# Patient Record
Sex: Female | Born: 1970
Health system: Southern US, Community
[De-identification: ages and names within clinical notes are randomized; demographics above are authoritative.]

## PROBLEM LIST (undated history)

## (undated) DIAGNOSIS — E669 Obesity, unspecified: Secondary | ICD-10-CM

## (undated) DIAGNOSIS — G43909 Migraine, unspecified, not intractable, without status migrainosus: Secondary | ICD-10-CM

## (undated) DIAGNOSIS — M545 Low back pain, unspecified: Secondary | ICD-10-CM

## (undated) DIAGNOSIS — Z87442 Personal history of urinary calculi: Secondary | ICD-10-CM

## (undated) DIAGNOSIS — N12 Tubulo-interstitial nephritis, not specified as acute or chronic: Secondary | ICD-10-CM

## (undated) DIAGNOSIS — I4891 Unspecified atrial fibrillation: Secondary | ICD-10-CM

## (undated) DIAGNOSIS — E7800 Pure hypercholesterolemia, unspecified: Secondary | ICD-10-CM

## (undated) DIAGNOSIS — D649 Anemia, unspecified: Secondary | ICD-10-CM

## (undated) DIAGNOSIS — F419 Anxiety disorder, unspecified: Secondary | ICD-10-CM

## (undated) DIAGNOSIS — Z9289 Personal history of other medical treatment: Secondary | ICD-10-CM

## (undated) DIAGNOSIS — I10 Essential (primary) hypertension: Secondary | ICD-10-CM

## (undated) DIAGNOSIS — F410 Panic disorder [episodic paroxysmal anxiety] without agoraphobia: Secondary | ICD-10-CM

## (undated) DIAGNOSIS — K219 Gastro-esophageal reflux disease without esophagitis: Secondary | ICD-10-CM

## (undated) DIAGNOSIS — G4730 Sleep apnea, unspecified: Secondary | ICD-10-CM

## (undated) DIAGNOSIS — G2581 Restless legs syndrome: Secondary | ICD-10-CM

## (undated) DIAGNOSIS — F329 Major depressive disorder, single episode, unspecified: Secondary | ICD-10-CM

## (undated) DIAGNOSIS — F32A Depression, unspecified: Secondary | ICD-10-CM

## (undated) DIAGNOSIS — K3184 Gastroparesis: Secondary | ICD-10-CM

## (undated) DIAGNOSIS — I209 Angina pectoris, unspecified: Secondary | ICD-10-CM

## (undated) DIAGNOSIS — O24419 Gestational diabetes mellitus in pregnancy, unspecified control: Secondary | ICD-10-CM

## (undated) DIAGNOSIS — G8929 Other chronic pain: Secondary | ICD-10-CM

## (undated) HISTORY — DX: Sleep apnea, unspecified: G47.30

## (undated) HISTORY — PX: DILATION AND CURETTAGE, DIAGNOSTIC / THERAPEUTIC: SUR384

## (undated) HISTORY — PX: BACK SURGERY: SHX140

## (undated) HISTORY — PX: COLONOSCOPY: SHX174

## (undated) HISTORY — PX: DILATION AND CURETTAGE OF UTERUS: SHX78

---
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
## 2000-11-09 ENCOUNTER — Emergency Department (HOSPITAL_COMMUNITY): Admission: EM | Admit: 2000-11-09 | Discharge: 2000-11-09 | Payer: Self-pay

## 2001-04-02 ENCOUNTER — Emergency Department (HOSPITAL_COMMUNITY): Admission: EM | Admit: 2001-04-02 | Discharge: 2001-04-02 | Payer: Self-pay | Admitting: Emergency Medicine

## 2001-04-22 ENCOUNTER — Emergency Department (HOSPITAL_COMMUNITY): Admission: EM | Admit: 2001-04-22 | Discharge: 2001-04-22 | Payer: Self-pay | Admitting: Emergency Medicine

## 2001-08-14 ENCOUNTER — Emergency Department (HOSPITAL_COMMUNITY): Admission: EM | Admit: 2001-08-14 | Discharge: 2001-08-14 | Payer: Self-pay | Admitting: Emergency Medicine

## 2002-03-11 ENCOUNTER — Emergency Department (HOSPITAL_COMMUNITY): Admission: EM | Admit: 2002-03-11 | Discharge: 2002-03-11 | Payer: Self-pay | Admitting: Emergency Medicine

## 2002-03-19 ENCOUNTER — Encounter: Payer: Self-pay | Admitting: Emergency Medicine

## 2002-03-19 ENCOUNTER — Emergency Department (HOSPITAL_COMMUNITY): Admission: EM | Admit: 2002-03-19 | Discharge: 2002-03-19 | Payer: Self-pay | Admitting: Emergency Medicine

## 2002-04-13 ENCOUNTER — Emergency Department (HOSPITAL_COMMUNITY): Admission: EM | Admit: 2002-04-13 | Discharge: 2002-04-13 | Payer: Self-pay | Admitting: Emergency Medicine

## 2002-04-13 ENCOUNTER — Encounter: Payer: Self-pay | Admitting: Emergency Medicine

## 2002-05-13 ENCOUNTER — Emergency Department (HOSPITAL_COMMUNITY): Admission: EM | Admit: 2002-05-13 | Discharge: 2002-05-13 | Payer: Self-pay | Admitting: Emergency Medicine

## 2002-06-07 ENCOUNTER — Ambulatory Visit (HOSPITAL_BASED_OUTPATIENT_CLINIC_OR_DEPARTMENT_OTHER): Admission: RE | Admit: 2002-06-07 | Discharge: 2002-06-07 | Payer: Self-pay | Admitting: Otolaryngology

## 2002-06-23 ENCOUNTER — Emergency Department (HOSPITAL_COMMUNITY): Admission: EM | Admit: 2002-06-23 | Discharge: 2002-06-23 | Payer: Self-pay | Admitting: Emergency Medicine

## 2002-07-10 ENCOUNTER — Ambulatory Visit (HOSPITAL_COMMUNITY): Admission: RE | Admit: 2002-07-10 | Discharge: 2002-07-10 | Payer: Self-pay | Admitting: Gastroenterology

## 2002-07-17 ENCOUNTER — Ambulatory Visit: Admission: RE | Admit: 2002-07-17 | Discharge: 2002-07-17 | Payer: Self-pay | Admitting: Gastroenterology

## 2002-07-17 ENCOUNTER — Encounter: Payer: Self-pay | Admitting: Gastroenterology

## 2002-07-20 ENCOUNTER — Emergency Department (HOSPITAL_COMMUNITY): Admission: EM | Admit: 2002-07-20 | Discharge: 2002-07-20 | Payer: Self-pay | Admitting: Emergency Medicine

## 2002-08-01 ENCOUNTER — Emergency Department (HOSPITAL_COMMUNITY): Admission: EM | Admit: 2002-08-01 | Discharge: 2002-08-01 | Payer: Self-pay | Admitting: Emergency Medicine

## 2002-08-26 ENCOUNTER — Emergency Department (HOSPITAL_COMMUNITY): Admission: EM | Admit: 2002-08-26 | Discharge: 2002-08-26 | Payer: Self-pay | Admitting: Emergency Medicine

## 2002-09-22 ENCOUNTER — Emergency Department (HOSPITAL_COMMUNITY): Admission: EM | Admit: 2002-09-22 | Discharge: 2002-09-22 | Payer: Self-pay | Admitting: Emergency Medicine

## 2002-09-23 ENCOUNTER — Emergency Department (HOSPITAL_COMMUNITY): Admission: EM | Admit: 2002-09-23 | Discharge: 2002-09-23 | Payer: Self-pay | Admitting: Emergency Medicine

## 2003-05-11 ENCOUNTER — Emergency Department (HOSPITAL_COMMUNITY): Admission: EM | Admit: 2003-05-11 | Discharge: 2003-05-11 | Payer: Self-pay | Admitting: Emergency Medicine

## 2003-05-26 ENCOUNTER — Emergency Department (HOSPITAL_COMMUNITY): Admission: EM | Admit: 2003-05-26 | Discharge: 2003-05-26 | Payer: Self-pay | Admitting: Emergency Medicine

## 2003-06-24 ENCOUNTER — Other Ambulatory Visit: Admission: RE | Admit: 2003-06-24 | Discharge: 2003-06-24 | Payer: Self-pay | Admitting: Family Medicine

## 2003-08-10 ENCOUNTER — Emergency Department (HOSPITAL_COMMUNITY): Admission: EM | Admit: 2003-08-10 | Discharge: 2003-08-11 | Payer: Self-pay | Admitting: Emergency Medicine

## 2003-09-16 ENCOUNTER — Ambulatory Visit (HOSPITAL_COMMUNITY): Admission: RE | Admit: 2003-09-16 | Discharge: 2003-09-16 | Payer: Self-pay | Admitting: *Deleted

## 2003-09-17 ENCOUNTER — Emergency Department (HOSPITAL_COMMUNITY): Admission: EM | Admit: 2003-09-17 | Discharge: 2003-09-17 | Payer: Self-pay | Admitting: Emergency Medicine

## 2003-09-17 ENCOUNTER — Emergency Department (HOSPITAL_COMMUNITY): Admission: EM | Admit: 2003-09-17 | Discharge: 2003-09-17 | Payer: Self-pay

## 2003-11-09 ENCOUNTER — Inpatient Hospital Stay (HOSPITAL_COMMUNITY): Admission: EM | Admit: 2003-11-09 | Discharge: 2003-11-11 | Payer: Self-pay | Admitting: Emergency Medicine

## 2003-11-14 ENCOUNTER — Ambulatory Visit (HOSPITAL_COMMUNITY): Admission: RE | Admit: 2003-11-14 | Discharge: 2003-11-14 | Payer: Self-pay | Admitting: Obstetrics and Gynecology

## 2003-11-14 ENCOUNTER — Encounter (INDEPENDENT_AMBULATORY_CARE_PROVIDER_SITE_OTHER): Payer: Self-pay | Admitting: Specialist

## 2003-11-18 ENCOUNTER — Ambulatory Visit (HOSPITAL_COMMUNITY): Admission: RE | Admit: 2003-11-18 | Discharge: 2003-11-18 | Payer: Self-pay | Admitting: Urology

## 2003-12-05 ENCOUNTER — Emergency Department (HOSPITAL_COMMUNITY): Admission: EM | Admit: 2003-12-05 | Discharge: 2003-12-05 | Payer: Self-pay | Admitting: Emergency Medicine

## 2004-02-02 ENCOUNTER — Emergency Department (HOSPITAL_COMMUNITY): Admission: EM | Admit: 2004-02-02 | Discharge: 2004-02-02 | Payer: Self-pay | Admitting: Emergency Medicine

## 2004-02-03 ENCOUNTER — Emergency Department (HOSPITAL_COMMUNITY): Admission: EM | Admit: 2004-02-03 | Discharge: 2004-02-03 | Payer: Self-pay | Admitting: Emergency Medicine

## 2004-02-16 HISTORY — PX: CYSTOSCOPY W/ URETERAL STENT PLACEMENT: SHX1429

## 2004-03-13 ENCOUNTER — Emergency Department (HOSPITAL_COMMUNITY): Admission: EM | Admit: 2004-03-13 | Discharge: 2004-03-13 | Payer: Self-pay | Admitting: Emergency Medicine

## 2004-03-17 ENCOUNTER — Ambulatory Visit (HOSPITAL_COMMUNITY): Admission: RE | Admit: 2004-03-17 | Discharge: 2004-03-17 | Payer: Self-pay | Admitting: Otolaryngology

## 2004-03-27 ENCOUNTER — Emergency Department (HOSPITAL_COMMUNITY): Admission: EM | Admit: 2004-03-27 | Discharge: 2004-03-27 | Payer: Self-pay | Admitting: Emergency Medicine

## 2004-05-23 ENCOUNTER — Emergency Department (HOSPITAL_COMMUNITY): Admission: EM | Admit: 2004-05-23 | Discharge: 2004-05-23 | Payer: Self-pay | Admitting: Emergency Medicine

## 2004-07-30 ENCOUNTER — Other Ambulatory Visit: Admission: RE | Admit: 2004-07-30 | Discharge: 2004-07-30 | Payer: Self-pay | Admitting: Obstetrics and Gynecology

## 2005-04-14 ENCOUNTER — Emergency Department (HOSPITAL_COMMUNITY): Admission: EM | Admit: 2005-04-14 | Discharge: 2005-04-14 | Payer: Self-pay | Admitting: *Deleted

## 2005-05-02 ENCOUNTER — Emergency Department (HOSPITAL_COMMUNITY): Admission: EM | Admit: 2005-05-02 | Discharge: 2005-05-02 | Payer: Self-pay | Admitting: *Deleted

## 2005-05-30 ENCOUNTER — Emergency Department (HOSPITAL_COMMUNITY): Admission: EM | Admit: 2005-05-30 | Discharge: 2005-05-31 | Payer: Self-pay | Admitting: Emergency Medicine

## 2005-07-20 ENCOUNTER — Emergency Department (HOSPITAL_COMMUNITY): Admission: EM | Admit: 2005-07-20 | Discharge: 2005-07-20 | Payer: Self-pay | Admitting: *Deleted

## 2005-08-03 ENCOUNTER — Encounter: Admission: RE | Admit: 2005-08-03 | Discharge: 2005-11-01 | Payer: Self-pay | Admitting: Obstetrics and Gynecology

## 2005-09-27 ENCOUNTER — Ambulatory Visit (HOSPITAL_COMMUNITY): Admission: RE | Admit: 2005-09-27 | Discharge: 2005-09-27 | Payer: Self-pay | Admitting: Obstetrics and Gynecology

## 2005-10-07 ENCOUNTER — Inpatient Hospital Stay (HOSPITAL_COMMUNITY): Admission: AD | Admit: 2005-10-07 | Discharge: 2005-10-09 | Payer: Self-pay | Admitting: Obstetrics and Gynecology

## 2009-11-26 ENCOUNTER — Encounter: Payer: Self-pay | Admitting: Cardiovascular Disease

## 2009-11-29 ENCOUNTER — Inpatient Hospital Stay (HOSPITAL_COMMUNITY): Admission: AD | Admit: 2009-11-29 | Discharge: 2009-11-30 | Payer: Self-pay | Admitting: Obstetrics and Gynecology

## 2009-12-11 DIAGNOSIS — Z8742 Personal history of other diseases of the female genital tract: Secondary | ICD-10-CM | POA: Insufficient documentation

## 2009-12-11 DIAGNOSIS — G2581 Restless legs syndrome: Secondary | ICD-10-CM | POA: Insufficient documentation

## 2009-12-11 DIAGNOSIS — G43909 Migraine, unspecified, not intractable, without status migrainosus: Secondary | ICD-10-CM | POA: Insufficient documentation

## 2009-12-11 DIAGNOSIS — K3184 Gastroparesis: Secondary | ICD-10-CM | POA: Insufficient documentation

## 2009-12-11 DIAGNOSIS — K219 Gastro-esophageal reflux disease without esophagitis: Secondary | ICD-10-CM | POA: Insufficient documentation

## 2009-12-24 ENCOUNTER — Ambulatory Visit (HOSPITAL_COMMUNITY): Admission: RE | Admit: 2009-12-24 | Discharge: 2009-12-24 | Payer: Self-pay | Admitting: Obstetrics and Gynecology

## 2009-12-31 ENCOUNTER — Encounter
Admission: RE | Admit: 2009-12-31 | Discharge: 2009-12-31 | Payer: Self-pay | Source: Home / Self Care | Attending: Obstetrics and Gynecology | Admitting: Obstetrics and Gynecology

## 2010-01-06 ENCOUNTER — Ambulatory Visit: Payer: Self-pay | Admitting: Cardiovascular Disease

## 2010-01-06 DIAGNOSIS — R0602 Shortness of breath: Secondary | ICD-10-CM

## 2010-01-14 ENCOUNTER — Ambulatory Visit (HOSPITAL_COMMUNITY)
Admission: RE | Admit: 2010-01-14 | Discharge: 2010-01-14 | Payer: Self-pay | Source: Home / Self Care | Admitting: Obstetrics and Gynecology

## 2010-01-20 ENCOUNTER — Ambulatory Visit (HOSPITAL_COMMUNITY): Admission: RE | Admit: 2010-01-20 | Payer: Self-pay | Admitting: Cardiovascular Disease

## 2010-01-29 ENCOUNTER — Ambulatory Visit (HOSPITAL_COMMUNITY): Admission: RE | Admit: 2010-01-29 | Payer: Self-pay | Source: Home / Self Care | Admitting: Cardiovascular Disease

## 2010-01-29 ENCOUNTER — Ambulatory Visit: Payer: Self-pay

## 2010-02-11 ENCOUNTER — Ambulatory Visit (HOSPITAL_COMMUNITY): Admission: RE | Admit: 2010-02-11 | Payer: Self-pay | Source: Home / Self Care | Admitting: Obstetrics and Gynecology

## 2010-02-12 ENCOUNTER — Ambulatory Visit (HOSPITAL_COMMUNITY): Admission: RE | Admit: 2010-02-12 | Payer: Self-pay | Source: Home / Self Care | Admitting: Cardiovascular Disease

## 2010-02-12 ENCOUNTER — Ambulatory Visit: Payer: Self-pay

## 2010-02-15 HISTORY — PX: TUBAL LIGATION: SHX77

## 2010-02-17 ENCOUNTER — Ambulatory Visit (HOSPITAL_COMMUNITY): Admission: RE | Admit: 2010-02-17 | Payer: Self-pay | Source: Home / Self Care | Admitting: Cardiovascular Disease

## 2010-02-20 ENCOUNTER — Ambulatory Visit (HOSPITAL_COMMUNITY)
Admission: RE | Admit: 2010-02-20 | Discharge: 2010-02-20 | Payer: Self-pay | Source: Home / Self Care | Attending: Obstetrics and Gynecology | Admitting: Obstetrics and Gynecology

## 2010-02-23 ENCOUNTER — Ambulatory Visit (HOSPITAL_COMMUNITY)
Admission: RE | Admit: 2010-02-23 | Discharge: 2010-02-23 | Payer: Self-pay | Source: Home / Self Care | Attending: Cardiovascular Disease | Admitting: Cardiovascular Disease

## 2010-02-24 ENCOUNTER — Telehealth: Payer: Self-pay | Admitting: Cardiovascular Disease

## 2010-03-06 ENCOUNTER — Ambulatory Visit
Admission: RE | Admit: 2010-03-06 | Discharge: 2010-03-06 | Payer: Self-pay | Source: Home / Self Care | Attending: Cardiovascular Disease | Admitting: Cardiovascular Disease

## 2010-03-06 DIAGNOSIS — R000 Tachycardia, unspecified: Secondary | ICD-10-CM | POA: Insufficient documentation

## 2010-03-07 ENCOUNTER — Other Ambulatory Visit (HOSPITAL_COMMUNITY): Payer: Self-pay | Admitting: Obstetrics and Gynecology

## 2010-03-07 DIAGNOSIS — O2690 Pregnancy related conditions, unspecified, unspecified trimester: Secondary | ICD-10-CM

## 2010-03-08 ENCOUNTER — Encounter: Payer: Self-pay | Admitting: Obstetrics and Gynecology

## 2010-03-11 ENCOUNTER — Ambulatory Visit (HOSPITAL_COMMUNITY): Admission: RE | Admit: 2010-03-11 | Payer: Self-pay | Source: Home / Self Care | Admitting: Obstetrics & Gynecology

## 2010-03-17 NOTE — Letter (Signed)
Summary: [facility] - P[facility]  [facility]   Imported By: Marylou Mccoy 12/11/2009 16:08:34  _____________________________________________________________________  External Attachment:    Type:   Image     Comment:   External Document

## 2010-03-17 NOTE — Assessment & Plan Note (Signed)
Summary: np3/htn/MEDICAID/mt   CC:  referal from Dr. Waynard Reeds high blood pressure.  History of Present Illness: Sandra [name] is [redacted] weeks pregnant and referred by OB/Gyn for HTN.  She has 5 children and has had gestational hypertension and diabetes with all of them  It has been 6 years since her last pregnancy and it was a bit of a surprise.  BP well controlled with Labatolol.  No previous history of DCM or PPDCM  Has some weight gain and dyspnea.  Worsening refulx.  Tries to watch salt intake.  Nonsmoker nondrinker  Other children are healthy.    Current Problems (verified): 1)  Dyspnea  (ICD-786.05) 2)  Restless Leg Syndrome  (ICD-333.94) 3)  Hypertension  (ICD-401.9) 4)  Gastroparesis  (ICD-536.3) 5)  Gerd  (ICD-530.81) 6)  Diabetes Mellitus, Gestational, Hx of  (ICD-V13.29) 7)  Migraine Headache  (ICD-346.90)  Current Medications (verified): 1)  Fioricet 50-325-40 Mg Tabs (Butalbital-Apap-Caffeine) .... As Needed 2)  Labetalol Hcl 200 Mg Tabs (Labetalol Hcl) .Marland Kitchen.. 1 Tab By Mouth Two Times A Day 3)  Phenergan 25 Mg/ (Promethazine Hcl) .... As Needed 4)  Ambien .... As Needed 5)  Dexilant 60 Mg Cpdr (Dexlansoprazole) .Marland Kitchen.. 1  Tab By Mouth Two Times A Day 6)  Humulin  (Insulin Isophane & Regular) .... 22units At Bedtime and 14units Morning 7)  Insulin .Marland Kitchen.. 4 Untis Two Times A Day  Allergies (verified): No Known Drug Allergies  Past History:  Past Medical History: Last updated: 12/11/2009 HYPERTENSION GASTROPARESIS  GERD DIABETES MELLITUS, GESTATIONAL, HX OF  MIGRAINE HEADACHE   Past Surgical History: Last updated: 12/11/2009  Cystourethroscopy, left retrograde pyelogram, left ureteroscopy.  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   Vital Signs:  Patient profile:   40 year old female Height:      64 inches Weight:      222 pounds BMI:     38.24 Pulse rate:   98 / minute Resp:     14 per minute BP sitting:   115 / 75  (left arm)  Vitals Entered By: Kem Parkinson (January 06, 2010 12:00 PM)  Physical Exam  General:  ;Affect appropriate Healthy:  appears stated age HEENT: normal Neck supple with no adenopathy JVP normal no bruits no thyromegaly Lungs clear with no wheezing and good diaphragmatic motion Heart:  S1/S2 no murmur,rub, gallop or click PMI normal Abdomen: benighn, BS positve, no tenderness, no AAA Pregnant no bruit.  No HSM or HJR Distal pulses intact with no bruits No edema Neuro non-focal Skin warm and dry    Impression & Recommendations:  Problem # 1:  DYSPNEA (ICD-786.05) Echo to assess RV and LV function Her updated medication list for this problem includes:    Labetalol Hcl 200 Mg Tabs (Labetalol hcl) .Marland Kitchen... 1 tab by mouth two times a day  Orders: Echocardiogram (Echo)  Problem # 2:  HYPERTENSION (ICD-401.9) Well controlled No evidence of vascular disease coarctation or RAS on exam.  Continue BB Her updated medication list for this problem includes:    Labetalol Hcl 200 Mg Tabs (Labetalol hcl) .Marland Kitchen... 1 tab by mouth  two times a day  Patient Instructions: 1)  Your physician recommends that you schedule a follow-up appointment in: 2 MONTHS 2)  Your physician has requested that you have an echocardiogram.  Echocardiography is a painless test that uses sound waves to create images of your heart. It provides your doctor with information about the size and shape of your heart and how well your heart's chambers and valves are working.  This procedure takes approximately one hour. There are  no restrictions for this procedure.   EKG Report  Procedure date:  01/06/2010  Findings:      NSR 98 Normal ECG

## 2010-03-18 ENCOUNTER — Encounter: Payer: Self-pay | Admitting: Obstetrics & Gynecology

## 2010-03-19 ENCOUNTER — Ambulatory Visit (HOSPITAL_COMMUNITY)
Admission: RE | Admit: 2010-03-19 | Discharge: 2010-03-19 | Disposition: A | Discharge status: Home / Self Care | Payer: Medicaid Other | Source: Ambulatory Visit | Attending: Obstetrics & Gynecology | Admitting: Obstetrics & Gynecology

## 2010-03-19 DIAGNOSIS — O10019 Pre-existing essential hypertension complicating pregnancy, unspecified trimester: Secondary | ICD-10-CM | POA: Insufficient documentation

## 2010-03-19 DIAGNOSIS — E119 Type 2 diabetes mellitus without complications: Secondary | ICD-10-CM | POA: Insufficient documentation

## 2010-03-19 DIAGNOSIS — O09529 Supervision of elderly multigravida, unspecified trimester: Secondary | ICD-10-CM | POA: Insufficient documentation

## 2010-03-19 DIAGNOSIS — O358XX0 Maternal care for other (suspected) fetal abnormality and damage, not applicable or unspecified: Secondary | ICD-10-CM | POA: Insufficient documentation

## 2010-03-19 DIAGNOSIS — O24919 Unspecified diabetes mellitus in pregnancy, unspecified trimester: Secondary | ICD-10-CM | POA: Insufficient documentation

## 2010-03-19 NOTE — Assessment & Plan Note (Signed)
Summary: F2M/DM   CC:  sob.  History of Present Illness: Sandra [name] is [redacted]weeks pregnant and referred by OB/Gyn for HTN and tachy .  She has 5 children and has had gestational hypertension and diabetes with all of them  It has been 6 years since her last pregnancy and it was a bit of a surprise.  BP well controlled with Labatolol.  Has had some proteinuria and is being watched weekly by OB for Preeclampsia.   No previous history of DCM or PPDCM  Has some weight gain and dyspnea.  Worsening refulx.  Tries to watch salt intake.  Nonsmoker nondrinker  Other children are healthy.  Echo done  1/9 showed normal EF and no significant structural heart problem. \ Current Problems (verified): 1)  Dyspnea  (ICD-786.05) 2)  Restless Leg Syndrome  (ICD-333.94) 3)  Hypertension  (ICD-401.9) 4)  Gastroparesis  (ICD-536.3) 5)  Gerd  (ICD-530.81) 6)  Diabetes Mellitus, Gestational, Hx of  (ICD-V13.29) 7)  Migraine Headache  (ICD-346.90)  Current Medications (verified): 1)  Fioricet 50-325-40 Mg Tabs (Butalbital-Apap-Caffeine) .... As Needed 2)  Labetalol Hcl 300 Mg Tabs (Labetalol Hcl) .Marland Kitchen.. 1 Tab By Mouth Three Times A Day 3)  Phenergan 25 Mg/ (Promethazine Hcl) .... As Needed 4)  Ambien .... As Needed 5)  Dexilant 60 Mg Cpdr (Dexlansoprazole) .Marland Kitchen.. 1  Tab By Mouth Two Times A Day 6)  Humulin  (Insulin Isophane & Regular) .... As Directed 7)  Insulin .... As Directed  Allergies (verified): No Known Drug Allergies  Past History:  Past Medical History: Last updated: 12/11/2009 HYPERTENSION GASTROPARESIS  GERD DIABETES MELLITUS, GESTATIONAL, HX OF  MIGRAINE HEADACHE   Past Surgical History: Last updated: 12/11/2009  Cystourethroscopy, left retrograde pyelogram, left ureteroscopy.  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   Vital Signs:  Patient profile:   40 year old female Height:      64 inches Weight:      222 pounds BMI:     38.24 Pulse rate:   99 / minute Resp:     14 per minute BP sitting:   120 / 81  (left arm)  Vitals Entered By: Kem Parkinson (March 06, 2010 12:13 PM)  Physical Exam  General:  Affect appropriate Healthy:  appears stated age HEENT: normal Neck supple with no adenopathy JVP normal no bruits no thyromegaly Lungs clear with no wheezing and good diaphragmatic motion Heart:  S1/S2 no murmur,rub, gallop or click PMI normal Abdomen: benighn, BS positve, no tenderness, no AAA no bruit.  No HSM or HJR Distal pulses intact with no bruits No edema Neuro non-focal Skin warm and dry    Impression & Recommendations:  Problem # 1:  HYPERTENSION (ICD-401.9) Improved continue Labatolol Her updated medication list for this problem includes:    Labetalol Hcl 300 Mg Tabs (Labetalol hcl) .Marland Kitchen... 1 tab by mouth three times a day  Problem # 2:  DIABETES MELLITUS, GESTATIONAL, HX OF (ICD-V13.29) Improved continue BP control and Fiorocet  Problem # 3:  DIABETES MELLITUS, GESTATIONAL, HX OF (ICD-V13.29) Continue insulin coverage  Increased risk of miscarriage with DM and age  Problem # 4:  TACHYCARDIA (ICD-785.0) Physiologic no structural heart disease  by echo.  Continue labatolol

## 2010-03-19 NOTE — Progress Notes (Signed)
Summary: pt returning call  Phone Note Call from Patient Call back at Home Phone [phone]   Caller: Patient Summary of Call: pt returning call Initial call taken by: Judie Grieve,  February 24, 2010 2:29 PM  Follow-up for Phone Call        Phone Call Completed pt aware of echo results. Follow-up by: Scherrie Bateman, LPN,  February 24, 2010 2:53 PM

## 2010-03-20 ENCOUNTER — Other Ambulatory Visit (HOSPITAL_COMMUNITY): Payer: Self-pay | Admitting: Obstetrics and Gynecology

## 2010-03-26 ENCOUNTER — Ambulatory Visit (HOSPITAL_COMMUNITY)
Admission: RE | Admit: 2010-03-26 | Discharge: 2010-03-26 | Disposition: A | Discharge status: Home / Self Care | Payer: Medicaid Other | Source: Ambulatory Visit | Attending: Obstetrics and Gynecology | Admitting: Obstetrics and Gynecology

## 2010-03-26 ENCOUNTER — Other Ambulatory Visit (HOSPITAL_COMMUNITY): Payer: Self-pay | Admitting: Maternal and Fetal Medicine

## 2010-03-26 DIAGNOSIS — O99810 Abnormal glucose complicating pregnancy: Secondary | ICD-10-CM | POA: Insufficient documentation

## 2010-03-26 DIAGNOSIS — O352XX0 Maternal care for (suspected) hereditary disease in fetus, not applicable or unspecified: Secondary | ICD-10-CM | POA: Insufficient documentation

## 2010-03-26 DIAGNOSIS — O09529 Supervision of elderly multigravida, unspecified trimester: Secondary | ICD-10-CM

## 2010-03-26 DIAGNOSIS — O10019 Pre-existing essential hypertension complicating pregnancy, unspecified trimester: Secondary | ICD-10-CM | POA: Insufficient documentation

## 2010-03-26 DIAGNOSIS — O2690 Pregnancy related conditions, unspecified, unspecified trimester: Secondary | ICD-10-CM

## 2010-03-26 DIAGNOSIS — O337XX0 Maternal care for disproportion due to other fetal deformities, not applicable or unspecified: Secondary | ICD-10-CM | POA: Insufficient documentation

## 2010-04-03 ENCOUNTER — Ambulatory Visit (HOSPITAL_COMMUNITY): Payer: Self-pay

## 2010-04-20 ENCOUNTER — Inpatient Hospital Stay (HOSPITAL_COMMUNITY)
Admission: AD | Admit: 2010-04-20 | Discharge: 2010-04-22 | DRG: 781 | Disposition: A | Discharge status: Home / Self Care | Payer: PRIVATE HEALTH INSURANCE | Source: Ambulatory Visit | Attending: Obstetrics and Gynecology | Admitting: Obstetrics and Gynecology

## 2010-04-20 DIAGNOSIS — E119 Type 2 diabetes mellitus without complications: Secondary | ICD-10-CM | POA: Diagnosis present

## 2010-04-20 DIAGNOSIS — O24919 Unspecified diabetes mellitus in pregnancy, unspecified trimester: Secondary | ICD-10-CM | POA: Diagnosis present

## 2010-04-20 DIAGNOSIS — O352XX0 Maternal care for (suspected) hereditary disease in fetus, not applicable or unspecified: Secondary | ICD-10-CM | POA: Diagnosis present

## 2010-04-20 DIAGNOSIS — O337XX0 Maternal care for disproportion due to other fetal deformities, not applicable or unspecified: Secondary | ICD-10-CM | POA: Diagnosis present

## 2010-04-20 DIAGNOSIS — R809 Proteinuria, unspecified: Secondary | ICD-10-CM

## 2010-04-20 DIAGNOSIS — IMO0002 Reserved for concepts with insufficient information to code with codable children: Principal | ICD-10-CM | POA: Diagnosis present

## 2010-04-20 DIAGNOSIS — I10 Essential (primary) hypertension: Secondary | ICD-10-CM | POA: Diagnosis present

## 2010-04-20 DIAGNOSIS — O09529 Supervision of elderly multigravida, unspecified trimester: Secondary | ICD-10-CM | POA: Diagnosis present

## 2010-04-20 LAB — CBC
HCT: 32.5 % — ABNORMAL LOW (ref 36.0–46.0)
Hemoglobin: 10.1 g/dL — ABNORMAL LOW (ref 12.0–15.0)
MCH: 27.8 pg (ref 26.0–34.0)
MCHC: 31.1 g/dL (ref 30.0–36.0)
MCV: 89.5 fL (ref 78.0–100.0)
RBC: 3.63 MIL/uL — ABNORMAL LOW (ref 3.87–5.11)

## 2010-04-20 LAB — COMPREHENSIVE METABOLIC PANEL
ALT: 12 U/L (ref 0–35)
CO2: 21 mEq/L (ref 19–32)
Calcium: 8.6 mg/dL (ref 8.4–10.5)
Chloride: 104 mEq/L (ref 96–112)
Creatinine, Ser: 0.62 mg/dL (ref 0.4–1.2)
GFR calc non Af Amer: >60 mL/min (ref >60)
Glucose, Bld: 65 mg/dL — ABNORMAL LOW (ref 70–99)
Total Bilirubin: 0.6 mg/dL (ref 0.3–1.2)

## 2010-04-21 ENCOUNTER — Inpatient Hospital Stay (HOSPITAL_COMMUNITY): Payer: PRIVATE HEALTH INSURANCE

## 2010-04-21 LAB — GLUCOSE, CAPILLARY
Glucose-Capillary: 94 mg/dL (ref 70–99)
Glucose-Capillary: 126 mg/dL — ABNORMAL HIGH (ref 70–99)

## 2010-04-21 LAB — URINALYSIS, MICROSCOPIC ONLY
Bilirubin Urine: NEGATIVE
Ketones, ur: NEGATIVE mg/dL
Leukocytes, UA: NEGATIVE
Nitrite: NEGATIVE
Specific Gravity, Urine: >1.03 — ABNORMAL HIGH (ref 1.005–1.030)
Urobilinogen, UA: 0.2 mg/dL (ref 0.0–1.0)
pH: 6 (ref 5.0–8.0)

## 2010-04-21 LAB — HEMOGLOBIN A1C
Hgb A1c MFr Bld: 6.3 % — ABNORMAL HIGH (ref <5.7)
Mean Plasma Glucose: 134 mg/dL — ABNORMAL HIGH (ref <117)

## 2010-04-21 LAB — CREATININE CLEARANCE, URINE, 24 HOUR
Creatinine Clearance: 126 mL/min — ABNORMAL HIGH (ref 75–115)
Creatinine, 24H Ur: 1124 mg/d (ref 700–1800)
Creatinine, Urine: 80.3 mg/dL
Creatinine: 0.62 mg/dL (ref 0.4–1.2)

## 2010-04-22 ENCOUNTER — Inpatient Hospital Stay (HOSPITAL_COMMUNITY)
Admission: AD | Admit: 2010-04-22 | Payer: Medicaid Other | Source: Ambulatory Visit | Admitting: Obstetrics and Gynecology

## 2010-04-22 ENCOUNTER — Inpatient Hospital Stay (HOSPITAL_COMMUNITY)
Admission: AD | Admit: 2010-04-22 | Discharge: 2010-04-22 | Disposition: A | Discharge status: Home / Self Care | Payer: PRIVATE HEALTH INSURANCE | Source: Ambulatory Visit | Attending: Obstetrics and Gynecology | Admitting: Obstetrics and Gynecology

## 2010-04-22 DIAGNOSIS — O4700 False labor before 37 completed weeks of gestation, unspecified trimester: Secondary | ICD-10-CM | POA: Insufficient documentation

## 2010-04-22 LAB — GLUCOSE, CAPILLARY
Glucose-Capillary: 108 mg/dL — ABNORMAL HIGH (ref 70–99)
Glucose-Capillary: 99 mg/dL (ref 70–99)

## 2010-04-22 LAB — CBC
Hemoglobin: 10.4 g/dL — ABNORMAL LOW (ref 12.0–15.0)
Platelets: 232 10*3/uL (ref 150–400)
RBC: 3.76 MIL/uL — ABNORMAL LOW (ref 3.87–5.11)
WBC: 11.4 10*3/uL — ABNORMAL HIGH (ref 4.0–10.5)

## 2010-04-22 LAB — COMPREHENSIVE METABOLIC PANEL
Alkaline Phosphatase: 80 U/L (ref 39–117)
BUN: 11 mg/dL (ref 6–23)
Chloride: 105 mEq/L (ref 96–112)
Creatinine, Ser: 0.68 mg/dL (ref 0.4–1.2)
GFR calc non Af Amer: >60 mL/min (ref >60)
Glucose, Bld: 93 mg/dL (ref 70–99)
Potassium: 4.7 mEq/L (ref 3.5–5.1)
Total Bilirubin: 0.5 mg/dL (ref 0.3–1.2)

## 2010-04-22 LAB — URINE CULTURE
Culture  Setup Time: 201203060512
Special Requests: NEGATIVE

## 2010-04-22 LAB — STREP B DNA PROBE

## 2010-04-22 LAB — TSH: TSH: 0.519 u[IU]/mL (ref 0.350–4.500)

## 2010-04-23 LAB — PROTEIN, URINE, 24 HOUR
Collection Interval-UPROT: 24 hours
Protein, Urine: 55 mg/dL

## 2010-04-24 ENCOUNTER — Ambulatory Visit (HOSPITAL_COMMUNITY): Payer: Medicaid Other

## 2010-04-29 LAB — DIFFERENTIAL
Basophils Relative: 0 % (ref 0–1)
Eosinophils Relative: 0 % (ref 0–5)
Lymphocytes Relative: 8 % — ABNORMAL LOW (ref 12–46)
Lymphs Abs: 0.5 10*3/uL — ABNORMAL LOW (ref 0.7–4.0)
Lymphs Abs: 0.9 10*3/uL (ref 0.7–4.0)
Monocytes Absolute: 0.3 10*3/uL (ref 0.1–1.0)
Monocytes Relative: 5 % (ref 3–12)
Monocytes Relative: 8 % (ref 3–12)
Neutro Abs: 3.4 10*3/uL (ref 1.7–7.7)
Neutrophils Relative %: 74 % (ref 43–77)

## 2010-04-29 LAB — URINE CULTURE
Colony Count: >=100000
Culture  Setup Time: 201110161142

## 2010-04-29 LAB — BLOOD GAS, ARTERIAL
FIO2: 0.21 %
O2 Saturation: 97 %
pCO2 arterial: 28.9 mmHg — ABNORMAL LOW (ref 35.0–45.0)
pO2, Arterial: 82.6 mmHg (ref 80.0–100.0)

## 2010-04-29 LAB — COMPREHENSIVE METABOLIC PANEL
AST: 25 U/L (ref 0–37)
Albumin: 3.5 g/dL (ref 3.5–5.2)
BUN: 5 mg/dL — ABNORMAL LOW (ref 6–23)
Calcium: 8.3 mg/dL — ABNORMAL LOW (ref 8.4–10.5)
Calcium: 9.1 mg/dL (ref 8.4–10.5)
Creatinine, Ser: 0.56 mg/dL (ref 0.4–1.2)
Creatinine, Ser: 0.61 mg/dL (ref 0.4–1.2)
GFR calc Af Amer: >60 mL/min (ref >60)
Glucose, Bld: 111 mg/dL — ABNORMAL HIGH (ref 70–99)
Total Protein: 6.3 g/dL (ref 6.0–8.3)

## 2010-04-29 LAB — INFLUENZA PANEL BY PCR (TYPE A & B): Influenza B By PCR: NEGATIVE

## 2010-04-29 LAB — GLUCOSE, CAPILLARY
Glucose-Capillary: 121 mg/dL — ABNORMAL HIGH (ref 70–99)
Glucose-Capillary: 189 mg/dL — ABNORMAL HIGH (ref 70–99)
Glucose-Capillary: 82 mg/dL (ref 70–99)

## 2010-04-29 LAB — URINE MICROSCOPIC-ADD ON

## 2010-04-29 LAB — URINALYSIS, ROUTINE W REFLEX MICROSCOPIC
Glucose, UA: NEGATIVE mg/dL
Leukocytes, UA: NEGATIVE
Protein, ur: 100 mg/dL — AB
Specific Gravity, Urine: >1.03 — ABNORMAL HIGH (ref 1.005–1.030)

## 2010-04-29 LAB — CBC
HCT: 32.4 % — ABNORMAL LOW (ref 36.0–46.0)
MCH: 30.4 pg (ref 26.0–34.0)
MCHC: 33.8 g/dL (ref 30.0–36.0)
MCHC: 34 g/dL (ref 30.0–36.0)
MCV: 90.1 fL (ref 78.0–100.0)
Platelets: 198 10*3/uL (ref 150–400)
RBC: 4.31 MIL/uL (ref 3.87–5.11)
RDW: 14.9 % (ref 11.5–15.5)

## 2010-05-05 ENCOUNTER — Other Ambulatory Visit: Payer: Self-pay | Admitting: Obstetrics and Gynecology

## 2010-05-05 ENCOUNTER — Inpatient Hospital Stay (HOSPITAL_COMMUNITY)
Admission: RE | Admit: 2010-05-05 | Discharge: 2010-05-08 | DRG: 765 | Disposition: A | Discharge status: Home / Self Care | Payer: PRIVATE HEALTH INSURANCE | Source: Ambulatory Visit | Attending: Obstetrics and Gynecology | Admitting: Obstetrics and Gynecology

## 2010-05-05 DIAGNOSIS — O99814 Abnormal glucose complicating childbirth: Secondary | ICD-10-CM | POA: Diagnosis present

## 2010-05-05 DIAGNOSIS — O09529 Supervision of elderly multigravida, unspecified trimester: Secondary | ICD-10-CM | POA: Diagnosis present

## 2010-05-05 DIAGNOSIS — O321XX0 Maternal care for breech presentation, not applicable or unspecified: Principal | ICD-10-CM | POA: Diagnosis present

## 2010-05-05 DIAGNOSIS — O1002 Pre-existing essential hypertension complicating childbirth: Secondary | ICD-10-CM | POA: Diagnosis present

## 2010-05-05 DIAGNOSIS — Z302 Encounter for sterilization: Secondary | ICD-10-CM

## 2010-05-05 LAB — COMPREHENSIVE METABOLIC PANEL
ALT: 13 U/L (ref 0–35)
AST: 18 U/L (ref 0–37)
Alkaline Phosphatase: 98 U/L (ref 39–117)
Glucose, Bld: 72 mg/dL (ref 70–99)
Potassium: 4.2 mEq/L (ref 3.5–5.1)
Sodium: 134 mEq/L — ABNORMAL LOW (ref 135–145)
Total Protein: 6.7 g/dL (ref 6.0–8.3)

## 2010-05-05 LAB — CBC
HCT: 34 % — ABNORMAL LOW (ref 36.0–46.0)
MCV: 88.8 fL (ref 78.0–100.0)
Platelets: 199 10*3/uL (ref 150–400)
RBC: 3.83 MIL/uL — ABNORMAL LOW (ref 3.87–5.11)
WBC: 11.5 10*3/uL — ABNORMAL HIGH (ref 4.0–10.5)

## 2010-05-05 LAB — GLUCOSE, CAPILLARY
Glucose-Capillary: 89 mg/dL (ref 70–99)
Glucose-Capillary: 81 mg/dL (ref 70–99)

## 2010-05-05 LAB — RPR: RPR Ser Ql: NONREACTIVE

## 2010-05-06 LAB — GLUCOSE, CAPILLARY
Glucose-Capillary: 89 mg/dL (ref 70–99)
Glucose-Capillary: 113 mg/dL — ABNORMAL HIGH (ref 70–99)

## 2010-05-06 LAB — CBC
Hemoglobin: 8.8 g/dL — ABNORMAL LOW (ref 12.0–15.0)
RBC: 3.25 MIL/uL — ABNORMAL LOW (ref 3.87–5.11)
WBC: 10.1 10*3/uL (ref 4.0–10.5)

## 2010-05-07 LAB — GLUCOSE, CAPILLARY
Glucose-Capillary: 106 mg/dL — ABNORMAL HIGH (ref 70–99)
Glucose-Capillary: 103 mg/dL — ABNORMAL HIGH (ref 70–99)
Glucose-Capillary: 103 mg/dL — ABNORMAL HIGH (ref 70–99)

## 2010-05-08 LAB — GLUCOSE, CAPILLARY: Glucose-Capillary: 93 mg/dL (ref 70–99)

## 2010-05-08 NOTE — Op Note (Signed)
Sandra [name], Sandra [name] NO.:  [id number]  MEDICAL RECORD NO.:  [id number]           PATIENT TYPE:  I  LOCATION:  9127                          FACILITY:  [facility]  PHYSICIAN:  Malva Limes, M.D.    DATE OF BIRTH:  [birth year]  DATE OF PROCEDURE:  05/05/2010 DATE OF DISCHARGE:                              OPERATIVE REPORT   PREOPERATIVE DIAGNOSES: 1. Intrauterine pregnancy at 35 weeks estimated gestational age. 2. Persistent breech presentation. 3. The patient desires permanent sterilization. 4. Chronic hypertension with worsening superimposed preeclampsia. 5. Gestational diabetes requiring insulin, poorly controlled.  POSTOPERATIVE DIAGNOSES: 1. Intrauterine pregnancy at 35 weeks estimated gestational age. 2. Persistent breech presentation. 3. The patient desires permanent sterilization. 4. Chronic hypertension with worsening superimposed preeclampsia. 5. Gestational diabetes requiring insulin, poorly controlled.  PROCEDURES: 1. Primary low transverse cesarean section. 2. Bilateral tubal ligation, Pomeroy procedure.  SURGEON:  Malva Limes, MD.  Threasa HeadsAldona Bar.  ANESTHESIA:  Spinal antibiotic, cefotetan 2 g.  DRAINS:  Foley bedside drainage.  ESTIMATED BLOOD LOSS:  900 mL.  SPECIMENS:  Portion of right and left fallopian tube sent to Pathology. Placenta sent to Pathology.  COMPLICATIONS:  None.  ESTIMATED BLOOD LOSS:  900 mL.  PROCEDURE IN DETAILS:  The patient was taken to the operating room where a spinal anesthetic was administered by Dr. Leilani Able.  Once this was accomplished, the patient was placed in a dorsal supine position with a left lateral tilt.  Once an adequate level was reached, the patient was prepped and draped in the usual fashion for this procedure.  A Pfannenstiel incision was made 2 cm above the pubic symphysis.  This was carried down to the abdominal cavity.  On entering the abdominal cavity, the bladder flap was  taken down with sharp dissection.  A low-transverse uterine incision was made in the midline and extended laterally with blunt dissection.  Amniotic fluid was noted to be clear.  The infant was delivered in breech presentation.  On delivery of the head, the oropharynx and nostrils bulb suctioned.  The cord was doubly clamped and cut and handed to the awaiting NICU team. Cord blood was then obtained.  The placenta was manually removed.  The uterus was exteriorized.  Uterine cavity was wiped with a wet lap.  The uterine incision was closed using a single layer of running locking 0 Monocryl suture.  The second imbricating layer using 0 Monocryl suture was then used.  The right uterine vessel was bleeding and this was made hemostatic using two figure-of-eight sutures.  The bladder flap was not repaired.  At this point the right fallopian tube was grasped in the isthmic portion.  A 2- to 3-cm knuckle was doubly ligated with 0 plain gut suture.  The knuckle was excised, both ostia visualized.  Hemostasis was appeared to be adequate.  A similar procedure was performed on the opposite side.  At this point the uterus was placed back in the abdominal cavity.  Hemostasis was checked and felt to be adequate.  The parietal peritoneum and rectus muscles were approximated in the midline using 2-0 Monocryl in a  running fashion.  The fascia was closed using 0 Monocryl suture in a running fashion.  The subcuticular tissue was approximately 8 cm deep.  It was made hemostatic with a Bovie.  That space was then closed using interrupted 2-0 plain gut suture.  The skin incision was closed using 3-0 Vicryl in a subcuticular fashion.  Steri- Strips were then placed.  The patient tolerated the procedure well.  She was taken to the recovery room in stable condition.  Instrument and lap counts were correct x3.          ______________________________ Malva Limes, M.D.     MA/MEDQ  D:  05/05/2010  T:   05/06/2010  Job:  [id number]  Electronically Signed by Malva Limes M.D. on 05/08/2010 05:31:10 PM

## 2010-05-09 NOTE — Discharge Summary (Signed)
  Sandra [name], MCMEEKIN NO.:  [id number]  MEDICAL RECORD NO.:  [id number]           PATIENT TYPE:  I  LOCATION:  9127                          [facility]ILITY[facility]  PHYSICIAN:  Malva Limes, M.D.    DATE OF BIRTH:  1970/04/14  DATE OF ADMISSION:  05/05/2010 DATE OF DISCHARGE:  05/07/2010                              DISCHARGE SUMMARY   DISCHARGE DIAGNOSES: 1. Intrauterine pregnancy at 35 weeks estimated gestational age. 2. Advanced maternal age. 3. Chronic hypertension with worsening superimposed preeclampsia. 4. Poorly controlled gestational diabetes, insulin requiring. 5. Breech presentation. 6. Patient desires permanent sterilization.  PRINCIPAL PROCEDURES: 1. Primary low transverse cesarean section. 2. Bilateral tubal ligation.  HISTORY OF PRESENT ILLNESS:  [name] is a 40 year old white female G8, P 5-0-2-5, [facility] June 09, 2010 at 35 weeks estimated gestational age who was admitted on March 20, 2010 for a primary cesarean section and bilateral tubal ligation at the suggestion of the Maternal Fetal Medicine Service.  The patient's pregnancy was complicated by chronic hypertension with worsening superimposed preeclampsia.  She also had insulin-requiring gestational diabetes which was poorly controlled. Because of the persistent breech presentation, the patient required cesarean section.  She also had a desire to have permanent sterilization.  She understood the failure rates with possible complications and possible change in her menstrual cycles.  HOSPITAL COURSE:  The patient was admitted on May 05, 2010 and underwent a cesarean section with bilateral tubal ligation and complete description of this can be found in the dictated operative notes.  The patient's postop hemoglobin was 8.8.  The pathology from a tubal ligation is pending.  The preop hemoglobin was 10.5.  The patient's blood sugars remained stable on no medication in the postpartum  period. Her blood pressures were labile requiring occasionally use of hypertensive meds.  The patient will be discharged to home on postop day #3.  She will be instructed to follow up in the office in 1 week.  She will be sent home with Percocet to take p.r.n. and also labetalol.  She will call the office with any elevated blood pressures.          ______________________________ Malva Limes, M.D.     MA/MEDQ  D:  05/08/2010  T:  05/09/2010  Job:  [id number]  Electronically Signed by Malva Limes M.D. on 05/09/2010 12:01:09 PM

## 2010-05-16 ENCOUNTER — Emergency Department (HOSPITAL_COMMUNITY)
Admission: EM | Admit: 2010-05-16 | Discharge: 2010-05-16 | Disposition: A | Discharge status: Home / Self Care | Payer: PRIVATE HEALTH INSURANCE | Attending: Emergency Medicine | Admitting: Emergency Medicine

## 2010-05-16 DIAGNOSIS — O99893 Other specified diseases and conditions complicating puerperium: Secondary | ICD-10-CM | POA: Insufficient documentation

## 2010-05-16 DIAGNOSIS — R112 Nausea with vomiting, unspecified: Secondary | ICD-10-CM | POA: Insufficient documentation

## 2010-05-16 DIAGNOSIS — R51 Headache: Secondary | ICD-10-CM | POA: Insufficient documentation

## 2010-05-16 DIAGNOSIS — I10 Essential (primary) hypertension: Secondary | ICD-10-CM | POA: Insufficient documentation

## 2010-07-03 NOTE — Op Note (Signed)
NAME:  Sandra [name], Sandra [name] NO.:  [id number]   MEDICAL RECORD NO.:  [id number]          PATIENT TYPE:  AMB   LOCATION:  SDC                           [facility]   PHYSICIAN:  Miguel Aschoff, M.D.       DATE OF BIRTH:  10-23-1970   DATE OF PROCEDURE:  11/14/2003  DATE OF DISCHARGE:                                 OPERATIVE REPORT   PREOPERATIVE DIAGNOSES:  Missed abortion.   POSTOPERATIVE DIAGNOSES:  Missed abortion.   PROCEDURE:  Suction curettage.   SURGEON:  Miguel Aschoff, M.D.   ANESTHESIA:  IV sedation with paracervical block.   COMPLICATIONS:  None.   JUSTIFICATION:  The patient is a 40 year old white female, gravida 5, para 4-  0-0-4 approximately 6-[redacted] weeks gestation. The patient presented to [facility] having a little abdominal pain and bleeding. Ultrasound  examination was carried out on the 24th of September revealing a 5 6/[redacted] week  gestational sac, no fetal pole was identified. The patient continued to have  bleeding throughout the week and presented to the office on September 28 and  repeat ultrasound did not show any evidence of the remaining gestational sac  and there was significant vaginal bleeding.  Because of the vaginal bleeding  she is being taken down to undergo suction curettage to evacuate the uterus  of any remaining products of conception.  The risks and benefits were  discussed with the patient. Informed consent was obtained.   DESCRIPTION OF PROCEDURE:  The patient was taken to the operating room,  placed in the supine position and IV sedation was administered without  difficulty.  She was then placed in the dorsal lithotomy position, prepped  and draped in the usual sterile fashion.  Once this was done, examination  was carried out revealing the uterus to be anterior approximately six week  size with a significant amount of blood in the vaginal vault. The cervical  os was noted to be open.  It is possible to pass a #25 Pratt  dilator without  difficulty through the cervical os and once this was done and the vacuum  curette was connection to the suction unit and suction curettage was carried  out yielding a small amount of tissue.  Following this, sharp curettage was  carried out and a final pass was made with a suction curette. At this point  with no further tissue being within the confines of the uterus, the  procedure was completed. All instruments were removed, instrument counts and  lap counts were taken and found to be correct.  The patient was brought to  the recovery room in satisfactory condition. Estimated blood loss from the  procedure was less than 30 mL. The patient tolerated the procedure well. The  plan is for the patient to be discharged home. Medications for  home include doxycycline 100 mg twice a day x3 days and Darvocet-N 100, 1  every 6 hours as needed for pain.  She is instructed to place nothing in the  vagina for two weeks and to call if there are any problems such  as fever,  pain or heavy bleeding.      AR/MEDQ  D:  11/14/2003  T:  11/14/2003  Job:  [id number]

## 2010-07-03 NOTE — Op Note (Signed)
NAME:  Sandra [name], Sandra [name]              ACCOUNT NO.:  [id number]   MEDICAL RECORD NO.:  [id number]          PATIENT TYPE:  AMB   LOCATION:  DAY                          [facility]ACILITY[facility]   PHYSICIAN:  Sigmund I. Patsi Sears, M.D.DATE OF BIRTH:  May 08, 1970   DATE OF PROCEDURE:  11/18/2003  DATE OF DISCHARGE:                                 OPERATIVE REPORT   PREOPERATIVE DIAGNOSIS:  Left ureteral stone.   POSTOPERATIVE DIAGNOSIS:  Left ureteral stone.   OPERATION:  Cystourethroscopy, left retrograde pyelogram, left ureteroscopy.   SURGEON:  Sigmund I. Patsi Sears, M.D.   ANESTHESIA:  General LMA.   PREPARATION:  After appropriate preanesthesia, the patient is brought to the  operating room and placed on the operating room table in a dorsal supine  position, where general LMA anesthesia was introduced.  She was then  replaced in the dorsal lithotomy position.  The pubis is prepped with  Betadine solution and draped in the usual fashion.   BRIEF HISTORY:  This 40 year old female is 1-1/2 weeks status post  cystourethroscopy, left retrograde pyelogram, and double-J catheter for  presentation to the emergency room with small distal ureteral calculus with  severe ureteral colic.  She was seven weeks pregnant at the time, requiring  25 mg of morphine IV and 4 mg of Dilaudid IV for pain control.  Retrograde  pyelogram was done with lead shields over the pelvis.  The double-J catheter  was left in place with a nylon suture attached to it, which the patient  removed herself because the double-J catheter was irritating to her.  She  has since had a spontaneous miscarriage and desires to have basket  extraction.  The patient passed a small clot-like substance this morning but  still has some discomfort in her ureter.   PROCEDURE:  Left retrograde pyelogram is performed, which shows a normal  caliber ureter and no evidence of a filling defect.  Ureteroscopy is  accomplished to the level of the  renal pelvis, and no stone is identified.  There is no hydronephrosis.  The renal pelvis is evaluated, and at least  eight different areas were identified.  The infundibulum is evaluated, and  there is no stone in the calyx or infundibulum.  The ureter is identified  again on the way to remove the scope.  Again, no stone is identified.  Xylocaine gel is left in the ureter.  The patient was given IV Toradol, a  B&O suppository, and is awakened and taken to the recovery room in good  condition.      SIT/MEDQ  D:  11/18/2003  T:  11/18/2003  Job:  [id number]

## 2010-07-20 NOTE — Discharge Summary (Signed)
  NAME:  Sandra [name], Sandra [name]               ACCOUNT NO.:  [id number]  MEDICAL RECORD NO.:  [id number]           PATIENT TYPE:  I  LOCATION:  9158                          [facility]ILITY[facility]  PHYSICIAN:  Eliel Dudding H. Tenny Craw, MD     DATE OF BIRTH:  03/06/1970  DATE OF ADMISSION:  04/20/2010 DATE OF DISCHARGE:  04/22/2010                              DISCHARGE SUMMARY   DISCHARGE DIAGNOSES: 1. Intrauterine pregnancy. 2. Chronic hypertension.  HOSPITAL PROCEDURES: 1. Glucose management 2. Blood pressure surveillance.  HOSPITAL COURSE:  [name] is a 40 year old gravida 8,  para 5-0-2-5 who was admitted at [redacted] weeks  gestational age for evaluation of worsening hypertension.  The patient's pregnancy has been complicated by multiple factors including hypertension, class B gestational diabetes and chronic  urinary tract infection. Patient was admitted to antenatal for worsening blood pressures and evaluation for possible superimposed pre-eclampsia. During this hospitalization a MFM consultation was obtained. In consultation with Dr. Tona Sensing of  MFM the criteria that he felt constituted severe pre-eclampsia in this patient with chronic hypertension were progressive proteinuria, accelerated requirement for antihypertensives, and worsening symptoms. During the hospitalization pre-eclampsia labs  were within normal limits. An Ultrasound was obtained and the fetus was noted to be well grown weighing 2256 g, 70th  percentile with amniotic fluid index of 14.  The decision was made to discharge the patient home on three times weekly  fetal surveillance and with labetalol 900 q.a.m., 600 every lunch, and 900 at dinner .  She will continue to monitor her blood glucoses closely.  Her current regimen is NPH 22 units  q.a.m. and 47 units nightly, regular insulin 12 units every breakfast and 8 units q lunch and 12 units at dinner.  She is instructed in signs or symptoms to be aware of and need to return to the hospital.   We will follow up in the office in 2 days.  DISCHARGE LABORATORY FINDINGS:  White blood cell count 11.4, hemoglobin 10.4, platelets 232,  sodium 134, potassium 4.7, chloride 105, CO2 23, BUN 11, creatinine 0.68, glucose 93 , 24 hour urine 730 mg of protein and urine cultures negative.     Freddrick March. Tenny Craw, MD     KHR/MEDQ  D:  07/17/2010  T:  07/18/2010  Job:  [id number]  Electronically Signed by Waynard Reeds MD on 07/20/2010 10:15:49 PM

## 2010-08-09 ENCOUNTER — Emergency Department (HOSPITAL_COMMUNITY)
Admission: EM | Admit: 2010-08-09 | Discharge: 2010-08-10 | Disposition: A | Discharge status: Home / Self Care | Payer: Medicaid Other | Attending: Emergency Medicine | Admitting: Emergency Medicine

## 2010-08-09 DIAGNOSIS — O169 Unspecified maternal hypertension, unspecified trimester: Secondary | ICD-10-CM | POA: Insufficient documentation

## 2010-08-09 DIAGNOSIS — K219 Gastro-esophageal reflux disease without esophagitis: Secondary | ICD-10-CM | POA: Insufficient documentation

## 2010-08-09 DIAGNOSIS — O99891 Other specified diseases and conditions complicating pregnancy: Secondary | ICD-10-CM | POA: Insufficient documentation

## 2010-08-09 DIAGNOSIS — G43909 Migraine, unspecified, not intractable, without status migrainosus: Secondary | ICD-10-CM | POA: Insufficient documentation

## 2010-09-26 ENCOUNTER — Emergency Department (HOSPITAL_COMMUNITY)
Admission: EM | Admit: 2010-09-26 | Discharge: 2010-09-26 | Disposition: A | Discharge status: Home / Self Care | Payer: PRIVATE HEALTH INSURANCE | Attending: Emergency Medicine | Admitting: Emergency Medicine

## 2010-09-26 DIAGNOSIS — R109 Unspecified abdominal pain: Secondary | ICD-10-CM | POA: Insufficient documentation

## 2010-09-26 DIAGNOSIS — I10 Essential (primary) hypertension: Secondary | ICD-10-CM | POA: Insufficient documentation

## 2010-09-26 DIAGNOSIS — Z8614 Personal history of Methicillin resistant Staphylococcus aureus infection: Secondary | ICD-10-CM | POA: Insufficient documentation

## 2010-09-26 DIAGNOSIS — D649 Anemia, unspecified: Secondary | ICD-10-CM | POA: Insufficient documentation

## 2010-09-26 DIAGNOSIS — N920 Excessive and frequent menstruation with regular cycle: Secondary | ICD-10-CM | POA: Insufficient documentation

## 2010-09-26 LAB — CBC
HCT: 31.6 % — ABNORMAL LOW (ref 36.0–46.0)
Hemoglobin: 9.8 g/dL — ABNORMAL LOW (ref 12.0–15.0)
MCV: 79.6 fL (ref 78.0–100.0)
Platelets: 268 10*3/uL (ref 150–400)
RBC: 3.97 MIL/uL (ref 3.87–5.11)
WBC: 5.9 10*3/uL (ref 4.0–10.5)

## 2010-09-26 LAB — URINALYSIS, ROUTINE W REFLEX MICROSCOPIC
Glucose, UA: NEGATIVE mg/dL
Hgb urine dipstick: NEGATIVE
Ketones, ur: NEGATIVE mg/dL
Protein, ur: NEGATIVE mg/dL

## 2010-09-26 LAB — URINE MICROSCOPIC-ADD ON

## 2011-02-13 ENCOUNTER — Emergency Department (HOSPITAL_BASED_OUTPATIENT_CLINIC_OR_DEPARTMENT_OTHER)
Admission: EM | Admit: 2011-02-13 | Discharge: 2011-02-14 | Disposition: A | Discharge status: Home / Self Care | Payer: Medicaid Other | Attending: Emergency Medicine | Admitting: Emergency Medicine

## 2011-02-13 ENCOUNTER — Encounter: Payer: Self-pay | Admitting: *Deleted

## 2011-02-13 DIAGNOSIS — K219 Gastro-esophageal reflux disease without esophagitis: Secondary | ICD-10-CM | POA: Insufficient documentation

## 2011-02-13 DIAGNOSIS — G43909 Migraine, unspecified, not intractable, without status migrainosus: Secondary | ICD-10-CM | POA: Insufficient documentation

## 2011-02-13 DIAGNOSIS — I10 Essential (primary) hypertension: Secondary | ICD-10-CM | POA: Insufficient documentation

## 2011-02-13 HISTORY — DX: Essential (primary) hypertension: I10

## 2011-02-13 HISTORY — DX: Migraine, unspecified, not intractable, without status migrainosus: G43.909

## 2011-02-13 HISTORY — DX: Gastroparesis: K31.84

## 2011-02-13 HISTORY — DX: Gastro-esophageal reflux disease without esophagitis: K21.9

## 2011-02-13 NOTE — ED Notes (Addendum)
Pt has hx of migraines and this one started last p.m. +nausea No relief with Fiorcet or Percocet. Toradol does not work. Usually gets shot and IV. "Cocktail may work, but first dose of most meds does not" PERL

## 2011-02-14 MED ORDER — SUMATRIPTAN SUCCINATE 6 MG/0.5ML ~~LOC~~ SOLN
6.0000 mg | Freq: Once | SUBCUTANEOUS | Status: AC
  Administered 2011-02-14: 6 mg via SUBCUTANEOUS
  Filled 2011-02-14: qty 0.5

## 2011-02-14 MED ORDER — DEXAMETHASONE SODIUM PHOSPHATE 4 MG/ML IJ SOLN
4.0000 mg | Freq: Once | INTRAMUSCULAR | Status: AC
  Administered 2011-02-14: 4 mg via INTRAVENOUS
  Filled 2011-02-14: qty 1

## 2011-02-14 MED ORDER — KETOROLAC TROMETHAMINE 30 MG/ML IJ SOLN
30.0000 mg | Freq: Once | INTRAMUSCULAR | Status: AC
  Administered 2011-02-14: 30 mg via INTRAVENOUS
  Filled 2011-02-14: qty 1

## 2011-02-14 MED ORDER — BUTALBITAL-APAP-CAFFEINE 50-325-40 MG PO TABS
1.0000 | ORAL_TABLET | Freq: Once | ORAL | Status: DC
  Filled 2011-02-14: qty 1

## 2011-02-14 MED ORDER — PROMETHAZINE HCL 25 MG/ML IJ SOLN
25.0000 mg | Freq: Once | INTRAMUSCULAR | Status: AC
  Administered 2011-02-14: 25 mg via INTRAVENOUS
  Filled 2011-02-14: qty 1

## 2011-02-14 MED ORDER — LORAZEPAM 2 MG/ML IJ SOLN
1.0000 mg | Freq: Once | INTRAMUSCULAR | Status: AC
  Administered 2011-02-14: 1 mg via INTRAVENOUS
  Filled 2011-02-14: qty 1

## 2011-02-14 MED ORDER — DIPHENHYDRAMINE HCL 50 MG/ML IJ SOLN
25.0000 mg | Freq: Once | INTRAMUSCULAR | Status: AC
  Administered 2011-02-14: 04:00:00 via INTRAVENOUS
  Filled 2011-02-14: qty 1

## 2011-02-14 MED ORDER — SODIUM CHLORIDE 0.9 % IV BOLUS (SEPSIS)
1000.0000 mL | Freq: Once | INTRAVENOUS | Status: AC
  Administered 2011-02-14: 1000 mL via INTRAVENOUS

## 2011-02-14 NOTE — ED Provider Notes (Signed)
History     CSN: [id number]  Arrival date & time 02/13/11  2145   First MD Initiated Contact with Patient 02/13/11 2353      Chief Complaint  Patient presents with  . Migraine    (Consider location/radiation/quality/duration/timing/severity/associated sxs/prior treatment) Patient is a 40 y.o. female presenting with migraine. The history is provided by the patient. No language interpreter was used.  Migraine This is a recurrent problem. The current episode started 2 days ago. The problem occurs constantly. The problem has not changed since onset.Associated symptoms include headaches. Pertinent negatives include no chest pain, no abdominal pain and no shortness of breath. The symptoms are aggravated by nothing. The symptoms are relieved by nothing. Treatments tried: narcotic pain relievers. The treatment provided no relief.  Not sudden onset not the worst headache of her life.  No focal neuro deficits.  Some nausea no emesis.  States migraine cocktails don't work for her.  The headache is right sided and 9/10 and is typical for her without any atypical features.    Past Medical History  Diagnosis Date  . Migraines   . GERD (gastroesophageal reflux disease)   . Gastroparesis   . Hypertension   . Kidney stone     Past Surgical History  Procedure Date  . Dilation and curettage of uterus   . Tubal ligation   . Cesarean section     History reviewed. No pertinent family history.  History  Substance Use Topics  . Smoking status: Never Smoker   . Smokeless tobacco: Not on file  . Alcohol Use: No    OB History    Grav Para Term Preterm Abortions TAB SAB Ect Mult Living                  Review of Systems  Constitutional: Negative for chills and activity change.  HENT: Negative for ear pain, neck pain and neck stiffness.   Eyes: Negative for discharge.  Respiratory: Negative for shortness of breath.   Cardiovascular: Negative for chest pain.  Gastrointestinal: Negative.   Negative for abdominal pain.  Genitourinary: Negative for difficulty urinating.  Musculoskeletal: Negative for arthralgias.  Neurological: Positive for headaches. Negative for dizziness, seizures, syncope, facial asymmetry, speech difficulty, weakness, light-headedness and numbness.  Hematological: Negative.   Psychiatric/Behavioral: Negative.     Allergies  Iodides and Reglan  Home Medications   Current Outpatient Rx  Name Route Sig Dispense Refill  . BUTALBITAL-APAP-CAFFEINE 50-325-40 MG PO TABS Oral Take 1 tablet by mouth 2 (two) times daily as needed.      Marland Kitchen GABAPENTIN 800 MG PO TABS Oral Take 800 mg by mouth 3 (three) times daily.      Marland Kitchen LABETALOL HCL 100 MG PO TABS Oral Take 100 mg by mouth 2 (two) times daily.      Marland Kitchen PROMETHAZINE HCL 25 MG PO TABS Oral Take 25 mg by mouth every 6 (six) hours as needed.        BP 135/99  Pulse 95  Temp(Src) 98.3 F (36.8 C) (Oral)  Resp 16  Ht 5\' 4"  (1.626 m)  Wt 210 lb (95.255 kg)  BMI 36.05 kg/m2  SpO2 99%  LMP 01/01/2011  Physical Exam  Constitutional: She is oriented to person, place, and time. She appears well-developed and well-nourished. No distress.  HENT:  Head: Normocephalic and atraumatic.  Right Ear: Tympanic membrane is not injected.  Left Ear: Tympanic membrane is not injected.  Eyes: Conjunctivae and EOM are normal. Pupils are equal, round,  and reactive to light.  Neck: Normal range of motion. Neck supple.  Cardiovascular: Normal rate and regular rhythm.   Pulmonary/Chest: Effort normal and breath sounds normal.  Abdominal: Soft. Bowel sounds are normal.  Musculoskeletal: Normal range of motion.  Lymphadenopathy:    She has no cervical adenopathy.  Neurological: She is alert and oriented to person, place, and time. She has normal reflexes. No cranial nerve deficit.  Skin: Skin is warm and dry.  Psychiatric: Thought content normal.    ED Course  Procedures (including critical care time)  Labs Reviewed - No  data to display No results found.   1. Migraine       MDM  Patient requesting PO or IV dilaudid but EDP states we will not give IV narcotics for migraine.  We will aggressively treat with fluids and migraine cocktail.         Jasmine Awe, MD 02/14/11 [phone]

## 2011-02-19 ENCOUNTER — Ambulatory Visit: Payer: PRIVATE HEALTH INSURANCE | Admitting: Physical Medicine & Rehabilitation

## 2011-02-28 ENCOUNTER — Encounter (HOSPITAL_BASED_OUTPATIENT_CLINIC_OR_DEPARTMENT_OTHER): Payer: Self-pay | Admitting: *Deleted

## 2011-02-28 ENCOUNTER — Emergency Department (HOSPITAL_BASED_OUTPATIENT_CLINIC_OR_DEPARTMENT_OTHER)
Admission: EM | Admit: 2011-02-28 | Discharge: 2011-02-28 | Disposition: A | Discharge status: Home / Self Care | Payer: Medicaid Other | Attending: Emergency Medicine | Admitting: Emergency Medicine

## 2011-02-28 ENCOUNTER — Emergency Department (INDEPENDENT_AMBULATORY_CARE_PROVIDER_SITE_OTHER): Payer: Medicaid Other

## 2011-02-28 DIAGNOSIS — K219 Gastro-esophageal reflux disease without esophagitis: Secondary | ICD-10-CM | POA: Insufficient documentation

## 2011-02-28 DIAGNOSIS — R52 Pain, unspecified: Secondary | ICD-10-CM

## 2011-02-28 DIAGNOSIS — R079 Chest pain, unspecified: Secondary | ICD-10-CM

## 2011-02-28 DIAGNOSIS — IMO0001 Reserved for inherently not codable concepts without codable children: Secondary | ICD-10-CM

## 2011-02-28 DIAGNOSIS — R059 Cough, unspecified: Secondary | ICD-10-CM | POA: Insufficient documentation

## 2011-02-28 DIAGNOSIS — R05 Cough: Secondary | ICD-10-CM

## 2011-02-28 DIAGNOSIS — Z79899 Other long term (current) drug therapy: Secondary | ICD-10-CM | POA: Insufficient documentation

## 2011-02-28 DIAGNOSIS — I10 Essential (primary) hypertension: Secondary | ICD-10-CM | POA: Insufficient documentation

## 2011-02-28 MED ORDER — HYDROCOD POLST-CHLORPHEN POLST 10-8 MG/5ML PO LQCR: 5.0000 mL | Freq: Two times a day (BID) | ORAL | Status: DC | PRN

## 2011-02-28 MED ORDER — ALBUTEROL SULFATE (5 MG/ML) 0.5% IN NEBU
5.0000 mg | INHALATION_SOLUTION | Freq: Once | RESPIRATORY_TRACT | Status: AC
  Administered 2011-02-28: 5 mg via RESPIRATORY_TRACT
  Filled 2011-02-28: qty 1

## 2011-02-28 NOTE — ED Notes (Addendum)
Pt called to request another cough medication d/t unable to afford Tussinex; rx changed to Robitussin AC 1 tsp q 4-6 hrs prn per Rubin Payor, NP; spoke w/ pharmacist at [facility] to confirm

## 2011-02-28 NOTE — ED Provider Notes (Signed)
Medical screening examination/treatment/procedure(s) were performed by non-physician practitioner and as supervising physician I was immediately available for consultation/collaboration.   Edom Schmuhl A Bartolo Montanye, MD 02/28/11 2138 

## 2011-02-28 NOTE — ED Provider Notes (Signed)
History     CSN: [id number]  Arrival date & time 02/28/11  1536   First MD Initiated Contact with Patient 02/28/11 1600      Chief Complaint  Patient presents with  . Generalized Body Aches    (Consider location/radiation/quality/duration/timing/severity/associated sxs/prior treatment) Patient is a 41 y.o. female presenting with cough. The history is provided by the patient. No language interpreter was used.  Cough This is a new problem. The current episode started more than 1 week ago. The problem occurs constantly. The problem has not changed since onset.The cough is productive of sputum. The maximum temperature recorded prior to her arrival was 100 to 100.9 F. Associated symptoms include rhinorrhea. Pertinent negatives include no ear pain, no sore throat and no shortness of breath. She has tried cough syrup and decongestants for the symptoms. She is not a smoker.    Past Medical History  Diagnosis Date  . Migraines   . GERD (gastroesophageal reflux disease)   . Gastroparesis   . Hypertension   . Kidney stone     Past Surgical History  Procedure Date  . Dilation and curettage of uterus   . Tubal ligation   . Cesarean section     History reviewed. No pertinent family history.  History  Substance Use Topics  . Smoking status: Never Smoker   . Smokeless tobacco: Not on file  . Alcohol Use: No    OB History    Grav Para Term Preterm Abortions TAB SAB Ect Mult Living                  Review of Systems  HENT: Positive for rhinorrhea. Negative for ear pain and sore throat.   Respiratory: Positive for cough. Negative for shortness of breath.   All other systems reviewed and are negative.    Allergies  Iodides and Reglan  Home Medications   Current Outpatient Rx  Name Route Sig Dispense Refill  . BUTALBITAL-APAP-CAFFEINE 50-325-40 MG PO TABS Oral Take 1 tablet by mouth 2 (two) times daily as needed. For migraines    . DEXLANSOPRAZOLE 60 MG PO CPDR Oral Take  60 mg by mouth 2 (two) times daily.    Marland Kitchen DEXTROMETHORPHAN POLISTIREX ER 30 MG/5ML PO LQCR Oral Take 60 mg by mouth every 12 (twelve) hours as needed. For cough    . DIPHENHYDRAMINE HCL 25 MG PO TABS Oral Take 50 mg by mouth every 4 (four) hours as needed. For runny nose and sneezing    . GABAPENTIN 800 MG PO TABS Oral Take 800 mg by mouth 3 (three) times daily.      Marland Kitchen LABETALOL HCL 300 MG PO TABS Oral Take 900 mg by mouth 3 (three) times daily.    Marland Kitchen PROMETHAZINE HCL 25 MG PO TABS Oral Take 25 mg by mouth every 6 (six) hours as needed. For nausea       BP 157/105  Pulse 92  Temp(Src) 99.8 F (37.7 C) (Oral)  Resp 20  Ht 5\' 4"  (1.626 m)  Wt 209 lb (94.802 kg)  BMI 35.87 kg/m2  SpO2 96%  LMP 01/31/2011  Physical Exam  Nursing note and vitals reviewed. Constitutional: She is oriented to person, place, and time. She appears well-developed and well-nourished.  HENT:  Head: Normocephalic and atraumatic.  Eyes: Conjunctivae and EOM are normal.  Neck: Neck supple.  Cardiovascular: Normal rate and regular rhythm.   Pulmonary/Chest: Effort normal. She has rales.  Abdominal: Soft. Bowel sounds are normal.  Musculoskeletal: Normal  range of motion.  Neurological: She is alert and oriented to person, place, and time.  Skin: Skin is warm and dry.    ED Course  Procedures (including critical care time)  Labs Reviewed - No data to display Dg Chest 2 View  02/28/2011  *RADIOLOGY REPORT*  Clinical Data: Flu like symptoms including chest pain and bodyaches.  Nonsmoker.  CHEST - 2 VIEW  Comparison: 11/18/2003  Findings: Midline trachea.  Normal heart size and mediastinal contours. No pleural effusion or pneumothorax.  Lung volumes are mildly diminished.  Left lung is clear.  There is minimal opacity in the right infrahilar region, favored to represent subsegmental atelectasis and vascular crowding.  IMPRESSION: No acute cardiopulmonary disease.  Original Report Authenticated By: Consuello Bossier,  M.D.     1. Cough       MDM  No sign of infection noted on x-ray:will treat symptomatically        Teressa Lower, NP 02/28/11 1726

## 2011-02-28 NOTE — ED Notes (Signed)
Pt states she has had flu-like s/s for 1-1/2 weeks.

## 2011-03-05 ENCOUNTER — Encounter: Payer: Medicaid Other | Admitting: Physical Medicine & Rehabilitation

## 2011-03-13 ENCOUNTER — Emergency Department (HOSPITAL_BASED_OUTPATIENT_CLINIC_OR_DEPARTMENT_OTHER)
Admission: EM | Admit: 2011-03-13 | Discharge: 2011-03-13 | Disposition: A | Discharge status: Home / Self Care | Payer: Medicaid Other | Attending: Emergency Medicine | Admitting: Emergency Medicine

## 2011-03-13 ENCOUNTER — Encounter (HOSPITAL_BASED_OUTPATIENT_CLINIC_OR_DEPARTMENT_OTHER): Payer: Self-pay | Admitting: *Deleted

## 2011-03-13 DIAGNOSIS — Z79899 Other long term (current) drug therapy: Secondary | ICD-10-CM | POA: Insufficient documentation

## 2011-03-13 DIAGNOSIS — K219 Gastro-esophageal reflux disease without esophagitis: Secondary | ICD-10-CM | POA: Insufficient documentation

## 2011-03-13 DIAGNOSIS — J329 Chronic sinusitis, unspecified: Secondary | ICD-10-CM | POA: Insufficient documentation

## 2011-03-13 DIAGNOSIS — R51 Headache: Secondary | ICD-10-CM

## 2011-03-13 DIAGNOSIS — I10 Essential (primary) hypertension: Secondary | ICD-10-CM | POA: Insufficient documentation

## 2011-03-13 DIAGNOSIS — G4730 Sleep apnea, unspecified: Secondary | ICD-10-CM | POA: Insufficient documentation

## 2011-03-13 MED ORDER — AZITHROMYCIN 250 MG PO TABS
500.0000 mg | ORAL_TABLET | Freq: Once | ORAL | Status: AC
  Administered 2011-03-13: 500 mg via ORAL
  Filled 2011-03-13: qty 2

## 2011-03-13 MED ORDER — BUTALBITAL-APAP-CAFFEINE 50-325-40 MG PO TABS: 1.0000 | ORAL_TABLET | Freq: Four times a day (QID) | ORAL | Status: DC | PRN

## 2011-03-13 MED ORDER — OXYCODONE-ACETAMINOPHEN 5-325 MG PO TABS: 1.0000 | ORAL_TABLET | ORAL | Status: AC | PRN

## 2011-03-13 MED ORDER — KETOROLAC TROMETHAMINE 60 MG/2ML IM SOLN
60.0000 mg | Freq: Once | INTRAMUSCULAR | Status: AC
  Administered 2011-03-13: 60 mg via INTRAMUSCULAR
  Filled 2011-03-13: qty 2

## 2011-03-13 MED ORDER — AZITHROMYCIN 250 MG PO TABS: 250.0000 mg | ORAL_TABLET | Freq: Every day | ORAL | Status: AC

## 2011-03-13 NOTE — ED Notes (Addendum)
Pt c/o migraine x 2 days and right ear pain as well. +nausea and dizziness. ? "blacked out x 2 in 2 weeks. Hit head and has knot on the head. Saw Dr. For same. Given Imitrex. Pupils dilated.

## 2011-03-13 NOTE — ED Notes (Signed)
Pt states that she has had two episodes of coughing over the past several days which resulted in temporary loc each time.  Pt states that she "quickly came around" but she reports swelling to the left side of her head after hitting her head on an unknown object during a fall in one of these episodes.  Dr Hyman Hopes informed.

## 2011-03-13 NOTE — ED Provider Notes (Signed)
History     CSN: [id number]  Arrival date & time 03/13/11  7829   First MD Initiated Contact with Patient 03/13/11 2252      Chief Complaint  Patient presents with  . Migraine    (Consider location/radiation/quality/duration/timing/severity/associated sxs/prior treatment) HPI Comments: 41 year old female with a history of migraines, hypertension, who presents with a complaint of headache. This is a frontal headache that is associated with copious amounts of sinus and nasal thick purulent drainage. Symptoms started in the last 48 hours, they are persistent, gradually getting worse. It is not associated with fevers, chills, nausea, vomiting, diarrhea. Over the last 3 weeks she has had upper respiratory symptoms including intermittent cough which is to have improved. However in the last several days she has also developed an ear pain on the right.  Patient is a 41 y.o. female presenting with migraine. The history is provided by the patient.  Migraine    Past Medical History  Diagnosis Date  . Migraines   . GERD (gastroesophageal reflux disease)   . Gastroparesis   . Hypertension   . Kidney stone   . Sleep apnea     Past Surgical History  Procedure Date  . Dilation and curettage of uterus   . Tubal ligation   . Cesarean section     History reviewed. No pertinent family history.  History  Substance Use Topics  . Smoking status: Never Smoker   . Smokeless tobacco: Not on file  . Alcohol Use: No    OB History    Grav Para Term Preterm Abortions TAB SAB Ect Mult Living                  Review of Systems  All other systems reviewed and are negative.    Allergies  Iodides and Reglan  Home Medications   Current Outpatient Rx  Name Route Sig Dispense Refill  . ALPRAZOLAM 0.5 MG PO TABS Oral Take 0.5 mg by mouth daily as needed. For anxiety    . BUTALBITAL-APAP-CAFFEINE 50-325-40 MG PO TABS Oral Take 1 tablet by mouth 2 (two) times daily as needed. For migraines     . DEXLANSOPRAZOLE 60 MG PO CPDR Oral Take 60 mg by mouth 2 (two) times daily.    Marland Kitchen DIPHENHYDRAMINE HCL 25 MG PO TABS Oral Take 50 mg by mouth every 4 (four) hours as needed. For runny nose and sneezing    . GABAPENTIN 800 MG PO TABS Oral Take 800 mg by mouth 3 (three) times daily.      Marland Kitchen HYDROCHLOROTHIAZIDE 25 MG PO TABS Oral Take 25 mg by mouth daily.    . IBUPROFEN 200 MG PO TABS Oral Take 400 mg by mouth 2 (two) times daily as needed. For pain    . LABETALOL HCL 300 MG PO TABS Oral Take 900 mg by mouth 3 (three) times daily.    . OXYCODONE-ACETAMINOPHEN 5-325 MG PO TABS Oral Take 1 tablet by mouth every 12 (twelve) hours as needed. For pain     . PROMETHAZINE HCL 25 MG PO TABS Oral Take 25 mg by mouth every 6 (six) hours as needed. For nausea     . ZOLPIDEM TARTRATE 10 MG PO TABS Oral Take 10 mg by mouth at bedtime as needed. For sleep    . AZITHROMYCIN 250 MG PO TABS Oral Take 1 tablet (250 mg total) by mouth daily. 500mg  PO day 1, then 250mg  PO days 205 6 tablet 0  . BUTALBITAL-APAP-CAFFEINE 50-325-40 MG  PO TABS Oral Take 1-2 tablets by mouth every 6 (six) hours as needed for headache. 20 tablet 0  . OXYCODONE-ACETAMINOPHEN 5-325 MG PO TABS Oral Take 1 tablet by mouth every 4 (four) hours as needed for pain. May take 2 tablets PO q 6 hours for severe pain - Do not take with Tylenol as this tablet already contains tylenol 15 tablet 0    BP 151/107  Pulse 88  Temp(Src) 98.5 F (36.9 C) (Oral)  Resp 18  Ht 5\' 4"  (1.626 m)  Wt 210 lb (95.255 kg)  BMI 36.05 kg/m2  SpO2 100%  LMP 03/13/2011  Physical Exam  Nursing note and vitals reviewed. Constitutional: She appears well-developed and well-nourished. No distress.  HENT:  Head: Normocephalic and atraumatic.  Mouth/Throat: Oropharynx is clear and moist. No oropharyngeal exudate.       Tympanic membranes clear bilaterally, posterior pharynx is mildly erythematous, no exudate asymmetry or hypertrophy. Nostrils with purulent yellow  mucus bilaterally  Eyes: Conjunctivae and EOM are normal. Pupils are equal, round, and reactive to light. Right eye exhibits no discharge. Left eye exhibits no discharge. No scleral icterus.  Neck: Normal range of motion. Neck supple. No JVD present. No thyromegaly present.  Cardiovascular: Normal rate, regular rhythm, normal heart sounds and intact distal pulses.  Exam reveals no gallop and no friction rub.   No murmur heard. Pulmonary/Chest: Effort normal and breath sounds normal. No respiratory distress. She has no wheezes. She has no rales.  Abdominal: Soft. Bowel sounds are normal. She exhibits no distension and no mass. There is no tenderness.  Musculoskeletal: Normal range of motion. She exhibits no edema and no tenderness.  Lymphadenopathy:    She has no cervical adenopathy.  Neurological: She is alert. Coordination normal.  Skin: Skin is warm and dry. No rash noted. No erythema.  Psychiatric: She has a normal mood and affect. Her behavior is normal.    ED Course  Procedures (including critical care time)  Labs Reviewed - No data to display No results found.   1. Sinusitis   2. Headache       MDM  Supple neck, afebrile, no tachycardia. Currently blood pressure is slightly elevated at 151/107. Encourage the patient to followup closely for both a blood pressure check and reevaluation of her headaches. I believe that the reason she is having headache today is because of sinusitis. There is no signs of neurologic deficits including no changes in vision, no weakness numbness or ataxia. Intramuscular Toradol and Zithromax given prior to discharge. Patient first to followup closely with her family doctor Dr. Wynelle Link in the next 2 days for recheck. She states that she is out of her medications for her headaches including Fioricet, Percocet. I will give her a short refill on his medications  Discharge prescriptions  Fioricet Percocet Zithromax         Vida Roller,  MD 03/13/11 2307

## 2011-03-19 ENCOUNTER — Encounter: Payer: Medicaid Other | Attending: Physical Medicine & Rehabilitation | Admitting: Physical Medicine & Rehabilitation

## 2011-03-19 DIAGNOSIS — E119 Type 2 diabetes mellitus without complications: Secondary | ICD-10-CM | POA: Insufficient documentation

## 2011-03-19 DIAGNOSIS — F411 Generalized anxiety disorder: Secondary | ICD-10-CM | POA: Insufficient documentation

## 2011-03-19 DIAGNOSIS — Z79899 Other long term (current) drug therapy: Secondary | ICD-10-CM | POA: Insufficient documentation

## 2011-03-19 DIAGNOSIS — E669 Obesity, unspecified: Secondary | ICD-10-CM | POA: Insufficient documentation

## 2011-03-19 DIAGNOSIS — M545 Low back pain, unspecified: Secondary | ICD-10-CM | POA: Insufficient documentation

## 2011-03-19 DIAGNOSIS — G43019 Migraine without aura, intractable, without status migrainosus: Secondary | ICD-10-CM

## 2011-03-19 DIAGNOSIS — I10 Essential (primary) hypertension: Secondary | ICD-10-CM | POA: Insufficient documentation

## 2011-03-19 DIAGNOSIS — F341 Dysthymic disorder: Secondary | ICD-10-CM

## 2011-03-19 DIAGNOSIS — M5380 Other specified dorsopathies, site unspecified: Secondary | ICD-10-CM

## 2011-03-19 DIAGNOSIS — G43719 Chronic migraine without aura, intractable, without status migrainosus: Secondary | ICD-10-CM | POA: Insufficient documentation

## 2011-03-19 NOTE — Letter (Signed)
March 19, 2011  [name], M.D. [address] [place], [place] [address]  RE:  Sandra [name] Policy ID # Policy Holder:  Dear Dr. Wynelle Link:  I had the opportunity to meet [facility] today in our office regarding her headaches and low back pain.  I spent an extensive amount of time working with her status totaling over 45 minutes today.  Thank you kindly for the referral and full note is as follows.  HISTORY OF PRESENT ILLNESS:  This is a 41 year old, white female, mother of 6, who has had chronic headaches since the mid 90s.  She states that she feels they were brought on by fatigue and stress related with her job.  She has [facility] here in [place] under Dr. Rhea Bleacher care in the past.  She ultimately moved to [place] and was seeing by a Pain Clinic there and has been back in [place] for the last month or so.  She has been on numerous medications including multiple Triptan medications.  She has been on prophylactic medications such as valproic acid, Topamax, Lamictal and Trileptal.  She used  Toradol at home.  She has had some trigger point injections and Botox which were not particularly helpful.  She had a another procedure which apparently was some type of ablation procedure as well.  She states that currently she is on gabapentin as prophylaxis for headaches with butalbital twice a day, and Percocet for breakthrough headaches.  Her headaches generally are 4-5/10 in intensity, but when she has a migraine, the headaches peaked at 10/10.  She was having severe headaches every month or so, but now is having this severe headaches weekly.  She did note that these maybe hormonally related and says that when she was pregnant with her last child that she was headache free just about for 9 months.  She states that lights, noise and restless sleep seemed to trigger headaches. With her headache, she describes that she has nausea and vomiting. She  denies any visual or other phenomenon.  She states that she sits up in a recliner and rest or rise that her symptoms may improve.  It does not help for her to lie down.  Her sleep is fair.  She gets about 6-8 hours of sleep at night.  Regarding her low back, she states that she hurt at work in 1998. She had some PT and has had problems off and on.  When she was in [place], she did have facet injections x3 and these seemed to be helpful for the symptoms.  The pain has increased, however, over the last few months.  She rates that pain about 4/10 and is worse with standing or prolonged sitting.  Walking also bothers her as well.  Pain is in the low back to buttock and thighs.  Regarding location of her headache pain, the symptoms tend to be over the frontal region predominantly and than radiating into the left temporal region back and finally into the occiput and neck on that same side.  REVIEW OF SYSTEMS:  Notable for dizziness, anxiety, vomiting, diarrhea, nausea and night sweats.  Full 12-point review is in the written health and history section of the chart.  PAST MEDICAL HISTORY:  Notable for high blood pressure, history of kidney stones, anxiety disorder, diabetes type 2 which has been controlled.  She also has acid reflux and diarrhea/history of gastroparesis.  SURGICAL HISTORY:  Notable for hospitalizations and nephrolithiasis at 1987, 2005 and 2008,  deviated septum repair in 2004, D and C in 2008 and 2010 and C-section tubal ligation in 2012.  CURRENT MEDICATIONS: 1. Xanax 0.5 mg 1 daily p.r.n. 2. Labetalol 300 mg up to 3 t.i.d. 3. Cefuroxime 500 mg daily for recent bacterial infection/upper     respiratory infection. 4. Oxycodone 5/325 one q.12 h. p.r.n. 5. Fioricet 1 b.i.d. 6. Dexilant 60 mg b.i.d. 7. Promethazine 25 mg q.8 h. p.r.n.  She usually uses this every     2 days or so. 8. Lomotil 1 q.i.d. p.r.n. 9. Ambien 10 mg at bedtime. 10.Gabapentin 800 mg  t.i.d.  ALLERGIES:  To REGLAN, IODINE and IMITREX.  SOCIAL HISTORY:  The patient is married, lives in [place].  She has a spouse and children ranging from 83-53 year old.  She does not smoke or drink.  FAMILY HISTORY:  Positive for diabetes and high blood pressure.  PHYSICAL EXAMINATION:  VITAL SIGNS:  Blood pressure is 148/96, pulse 88, respiratory rate is 16 and she is satting 98% on room air. GENERAL:  The patient is pleasant and generally alert.  She is obese. EYES:  Pupils equally, round and reactive to light. HEART:  Regular. CHEST:  Clear. ABDOMEN:  Soft, nontender. NEUROLOGICAL:  Cranial nerve exam is grossly intact, though she did complain of some diminishment of her visual acuity in the peripheral fields.  Cognitively, she is alert and oriented with good insight and awareness.  Memory seemed to be functional. Strength in the upper extremities is 5/5, as it was in the lower extremities as well.  Sensory exam is 2/2 in all 4 limbs and reflexes are 2+.  She has normal range of motion in the neck. Spurling test was negative.  There was no provocation of any of her headache symptoms with palpation of the neck today.  She had no sensitivity over the forehead or scalp.  Rotator cuff maneuvers were negative.  Low back was notable for some tenderness in the L3- L5 levels right more than left.  She had more of muscle tone in the right paraspinals.  She is able to bend easily and touch her toes without limitations or hesitation.  She did have some mild to minimal pain with extension.  She did have notable pain with facet maneuvers and lateral bending to either side right more than left. Posture was fair.  Straight leg testing was negative.  Gait pattern was normal.  ASSESSMENT: 1. Chronic common migraine with intractable migraine. 2. Low back pain most consistent with facet arthropathy. 3. Obesity. 4. History of diabetes, type 2. 5. Hypertension. 6. Anxiety.  PLAN: 1.  The patient has had exhaustive treatments already for her     headaches.  I told her at this point that it might be     worthwhile to try dual prophylactic agents for her headaches.     She has not done this before.  Given that she has a history     of kidney stones, we will stay away from the Topamax and use     valproic acid 250 mg at bedtime for 1 week then b.i.d. 2. Introduce meloxicam 15 mg p.o. q.a.m. with food. 3. Consider Botox trial again for headaches. 4. The patient was given Pilates exercises for her low back. 5. I did send her for x-rays of her lumbar spine to assess her     facets and disk spaces. 6. Consider medial branch blocks. 7. Ultimately, I would like her to have some physical therapy.  I did ask her to bring her home exercise regimen back with     her when I see her in the clinic. 8. I am willing to prescribe her pain medications.  She was     urine drug tested today, however first.  Once we have clean     results, we can proceed with filling her narcotics.     Ranelle Oyster, M.D. Electronically Signed    [id number] D:  03/19/2011 13:28:24  T:  03/19/2011 18:30:00  Job #:  [id number]  cc:   [name], M.D. Fax: [phone]

## 2011-04-20 ENCOUNTER — Emergency Department (INDEPENDENT_AMBULATORY_CARE_PROVIDER_SITE_OTHER): Payer: Medicaid Other

## 2011-04-20 ENCOUNTER — Ambulatory Visit: Payer: Medicaid Other | Admitting: Physical Medicine & Rehabilitation

## 2011-04-20 ENCOUNTER — Encounter (HOSPITAL_BASED_OUTPATIENT_CLINIC_OR_DEPARTMENT_OTHER): Payer: Self-pay | Admitting: *Deleted

## 2011-04-20 ENCOUNTER — Emergency Department (HOSPITAL_BASED_OUTPATIENT_CLINIC_OR_DEPARTMENT_OTHER)
Admission: EM | Admit: 2011-04-20 | Discharge: 2011-04-20 | Disposition: A | Discharge status: Home / Self Care | Payer: Medicaid Other | Attending: Emergency Medicine | Admitting: Emergency Medicine

## 2011-04-20 DIAGNOSIS — G43909 Migraine, unspecified, not intractable, without status migrainosus: Secondary | ICD-10-CM | POA: Insufficient documentation

## 2011-04-20 DIAGNOSIS — K219 Gastro-esophageal reflux disease without esophagitis: Secondary | ICD-10-CM | POA: Insufficient documentation

## 2011-04-20 DIAGNOSIS — M545 Low back pain, unspecified: Secondary | ICD-10-CM | POA: Insufficient documentation

## 2011-04-20 DIAGNOSIS — Z87442 Personal history of urinary calculi: Secondary | ICD-10-CM | POA: Insufficient documentation

## 2011-04-20 DIAGNOSIS — I10 Essential (primary) hypertension: Secondary | ICD-10-CM | POA: Insufficient documentation

## 2011-04-20 DIAGNOSIS — M549 Dorsalgia, unspecified: Secondary | ICD-10-CM

## 2011-04-20 MED ORDER — OXYCODONE-ACETAMINOPHEN 5-325 MG PO TABS: 1.0000 | ORAL_TABLET | Freq: Four times a day (QID) | ORAL | Status: DC | PRN

## 2011-04-20 NOTE — Discharge Instructions (Signed)
Back Pain, Adult Low back pain is very common. About 1 in 5 people have back pain.The cause of low back pain is rarely dangerous. The pain often gets better over time.About half of people with a sudden onset of back pain feel better in just 2 weeks. About 8 in 10 people feel better by 6 weeks.  CAUSES Some common causes of back pain include:  Strain of the muscles or ligaments supporting the spine.   Wear and tear (degeneration) of the spinal discs.   Arthritis.   Direct injury to the back.  DIAGNOSIS Most of the time, the direct cause of low back pain is not known.However, back pain can be treated effectively even when the exact cause of the pain is unknown.Answering your caregiver's questions about your overall health and symptoms is one of the most accurate ways to make sure the cause of your pain is not dangerous. If your caregiver needs more information, he or she may order lab work or imaging tests (X-rays or MRIs).However, even if imaging tests show changes in your back, this usually does not require surgery. HOME CARE INSTRUCTIONS For many people, back pain returns.Since low back pain is rarely dangerous, it is often a condition that people can learn to manageon their own.   Remain active. It is stressful on the back to sit or stand in one place. Do not sit, drive, or stand in one place for more than 30 minutes at a time. Take short walks on level surfaces as soon as pain allows.Try to increase the length of time you walk each day.   Do not stay in bed.Resting more than 1 or 2 days can delay your recovery.   Do not avoid exercise or work.Your body is made to move.It is not dangerous to be active, even though your back may hurt.Your back will likely heal faster if you return to being active before your pain is gone.   Pay attention to your body when you bend and lift. Many people have less discomfortwhen lifting if they bend their knees, keep the load close to their  bodies,and avoid twisting. Often, the most comfortable positions are those that put less stress on your recovering back.   Find a comfortable position to sleep. Use a firm mattress and lie on your side with your knees slightly bent. If you lie on your back, put a pillow under your knees.   Only take over-the-counter or prescription medicines as directed by your caregiver. Over-the-counter medicines to reduce pain and inflammation are often the most helpful.Your caregiver may prescribe muscle relaxant drugs.These medicines help dull your pain so you can more quickly return to your normal activities and healthy exercise.   Put ice on the injured area.   Put ice in a plastic bag.   Place a towel between your skin and the bag.   Leave the ice on for 15 to 20 minutes, 3 to 4 times a day for the first 2 to 3 days. After that, ice and heat may be alternated to reduce pain and spasms.   Ask your caregiver about trying back exercises and gentle massage. This may be of some benefit.   Avoid feeling anxious or stressed.Stress increases muscle tension and can worsen back pain.It is important to recognize when you are anxious or stressed and learn ways to manage it.Exercise is a great option.  SEEK MEDICAL CARE IF:  You have pain that is not relieved with rest or medicine.   You have   pain that does not improve in 1 week.   You have new symptoms.   You are generally not feeling well.  SEEK IMMEDIATE MEDICAL CARE IF:   You have pain that radiates from your back into your legs.   You develop new bowel or bladder control problems.   You have unusual weakness or numbness in your arms or legs.   You develop nausea or vomiting.   You develop abdominal pain.   You feel faint.  Document Released: 02/01/2005 Document Revised: 01/21/2011 Document Reviewed: 06/22/2010 [employer] Patient Information 2012 [employer]. 

## 2011-04-20 NOTE — ED Provider Notes (Signed)
History     CSN: [id number]  Arrival date & time 04/20/11  2144   First MD Initiated Contact with Patient 04/20/11 2233      Chief Complaint  Patient presents with  . Back Pain    (Consider location/radiation/quality/duration/timing/severity/associated sxs/prior treatment) Patient is a 41 y.o. female presenting with back pain. The history is provided by the patient.  Back Pain  This is a chronic problem. Episode onset: several weeks ago. The problem occurs constantly. The problem has been gradually worsening. Associated with: an old injury 8 years ago. The pain is present in the lumbar spine. The quality of the pain is described as stabbing. The pain does not radiate. The pain is moderate. The symptoms are aggravated by twisting, certain positions and bending. Pertinent negatives include no abdominal pain and no dysuria.    Past Medical History  Diagnosis Date  . Migraines   . GERD (gastroesophageal reflux disease)   . Gastroparesis   . Hypertension   . Kidney stone   . Sleep apnea     Past Surgical History  Procedure Date  . Dilation and curettage of uterus   . Tubal ligation   . Cesarean section     History reviewed. No pertinent family history.  History  Substance Use Topics  . Smoking status: Never Smoker   . Smokeless tobacco: Not on file  . Alcohol Use: No    OB History    Grav Para Term Preterm Abortions TAB SAB Ect Mult Living                  Review of Systems  Gastrointestinal: Negative for abdominal pain.  Genitourinary: Negative for dysuria.  Musculoskeletal: Positive for back pain.  All other systems reviewed and are negative.    Allergies  Iodides and Reglan  Home Medications   Current Outpatient Rx  Name Route Sig Dispense Refill  . ALPRAZOLAM 0.5 MG PO TABS Oral Take 0.5 mg by mouth daily as needed. For anxiety    . BUTALBITAL-APAP-CAFFEINE 50-325-40 MG PO TABS Oral Take 1-2 tablets by mouth every 6 (six) hours as needed for headache.  20 tablet 0  . DEXLANSOPRAZOLE 60 MG PO CPDR Oral Take 60 mg by mouth 2 (two) times daily.    Marland Kitchen DIPHENHYDRAMINE HCL 25 MG PO TABS Oral Take 50 mg by mouth every 4 (four) hours as needed. For runny nose and sneezing    . GABAPENTIN 800 MG PO TABS Oral Take 800 mg by mouth 3 (three) times daily.      . IBUPROFEN 200 MG PO TABS Oral Take 400 mg by mouth 2 (two) times daily as needed. For pain    . LABETALOL HCL 300 MG PO TABS Oral Take 600 mg by mouth 2 (two) times daily.     . MELOXICAM 15 MG PO TABS Oral Take 15 mg by mouth daily.    . OXYCODONE-ACETAMINOPHEN 5-325 MG PO TABS Oral Take 1 tablet by mouth every 12 (twelve) hours as needed. For pain     . PROMETHAZINE HCL 25 MG PO TABS Oral Take 25 mg by mouth every 6 (six) hours as needed. For nausea     . VALPROIC ACID 250 MG PO CAPS Oral Take 250 mg by mouth daily.    Marland Kitchen ZOLPIDEM TARTRATE 10 MG PO TABS Oral Take 10 mg by mouth at bedtime as needed. For sleep      BP 173/100  Pulse 68  Temp 98.2 F (36.8 C)  Resp 16  Ht 5\' 4"  (1.626 m)  Wt 220 lb (99.791 kg)  BMI 37.76 kg/m2  SpO2 99%  LMP 04/18/2011  Physical Exam  Nursing note and vitals reviewed. Constitutional: She is oriented to person, place, and time. She appears well-developed and well-nourished.  HENT:  Head: Normocephalic and atraumatic.  Neck: Normal range of motion. Neck supple.  Musculoskeletal:       ttp in the lumbar soft tissues.  Neurological: She is alert and oriented to person, place, and time.       dtr's are 2+ and equal in ble.  Strength is 5/5 in ble.  Walks on heels, toes without difficulty.  Skin: Skin is warm and dry. She is not diaphoretic.    ED Course  Procedures (including critical care time)  Labs Reviewed - No data to display Dg Lumbar Spine Complete  04/20/2011  *RADIOLOGY REPORT*  Clinical Data: Chronic back pain for 8 years, greater on left  LUMBAR SPINE - COMPLETE 4+ VIEW  Comparison: None  Findings: Osseous mineralization normal. Five  non-rib bearing lumbar vertebrae. Vertebral body and disc space heights maintained. No acute fracture, subluxation, or bone destruction. No spondylolysis. SI joints symmetric. Multiple tiny radiopacities project over the right kidney, question bowel artifacts,not seen on RPO view.  IMPRESSION: No acute abnormalities.  Original Report Authenticated By: Lollie Marrow, M.D.     No diagnosis found.    MDM  Acute on chronic pain.  Just moved back to area and has an appointment with pain mgmt.  She needs to see them for prescriptions.        Geoffery Lyons, MD 04/20/11 2242

## 2011-04-20 NOTE — ED Notes (Signed)
Pt requesting back xrays

## 2011-04-20 NOTE — ED Notes (Signed)
[facility] Pt has paper work requesting lumbar xray. Pt has had chronic pains since 1998, and states has flareups. She is already on pain meds, but is here for further evaluation.

## 2011-04-21 ENCOUNTER — Encounter: Payer: Medicaid Other | Attending: Physical Medicine & Rehabilitation | Admitting: Physical Medicine & Rehabilitation

## 2011-04-21 ENCOUNTER — Encounter: Payer: Self-pay | Admitting: Physical Medicine & Rehabilitation

## 2011-04-21 DIAGNOSIS — M47816 Spondylosis without myelopathy or radiculopathy, lumbar region: Secondary | ICD-10-CM | POA: Insufficient documentation

## 2011-04-21 DIAGNOSIS — G43909 Migraine, unspecified, not intractable, without status migrainosus: Secondary | ICD-10-CM

## 2011-04-21 DIAGNOSIS — M7918 Myalgia, other site: Secondary | ICD-10-CM

## 2011-04-21 DIAGNOSIS — IMO0001 Reserved for inherently not codable concepts without codable children: Secondary | ICD-10-CM | POA: Insufficient documentation

## 2011-04-21 DIAGNOSIS — M7060 Trochanteric bursitis, unspecified hip: Secondary | ICD-10-CM

## 2011-04-21 DIAGNOSIS — M47817 Spondylosis without myelopathy or radiculopathy, lumbosacral region: Secondary | ICD-10-CM | POA: Insufficient documentation

## 2011-04-21 DIAGNOSIS — M76899 Other specified enthesopathies of unspecified lower limb, excluding foot: Secondary | ICD-10-CM | POA: Insufficient documentation

## 2011-04-21 MED ORDER — OXYCODONE-ACETAMINOPHEN 5-325 MG PO TABS: 1.0000 | ORAL_TABLET | Freq: Four times a day (QID) | ORAL | Status: AC | PRN

## 2011-04-21 MED ORDER — METHOCARBAMOL 500 MG PO TABS: 500.0000 mg | ORAL_TABLET | Freq: Four times a day (QID) | ORAL | Status: DC | PRN

## 2011-04-21 MED ORDER — VALPROIC ACID 250 MG PO CAPS: 500.0000 mg | ORAL_CAPSULE | Freq: Two times a day (BID) | ORAL | Status: DC

## 2011-04-21 NOTE — Patient Instructions (Signed)
Trochanteric Bursitis You have hip pain due to trochanteric bursitis. Bursitis means that the sack near the outside of the hip is filled with fluid and inflamed. This sack is made up of protective soft tissue. The pain from trochanteric bursitis can be severe and keep you from sleep. It can radiate to the buttocks or down the outside of the thigh to the knee. The pain is almost always worse when rising from the seated or lying position and with walking. Pain can improve after you take a few steps. It happens more often in people with hip joint and lumbar spine problems, such as arthritis or previous surgery. Very rarely the trochanteric bursa can become infected, and antibiotics and/or surgery may be needed. Treatment often includes an injection of local anesthetic mixed with cortisone medicine. This medicine is injected into the area where it is most tender over the hip. Repeat injections may be necessary if the response to treatment is slow. You can apply ice packs over the tender area for 30 minutes every 2 hours for the next few days. Anti-inflammatory and/or narcotic pain medicine may also be helpful. Limit your activity for the next few days if the pain continues. See your caregiver in 5-10 days if you are not greatly improved.  SEEK IMMEDIATE MEDICAL CARE IF:  You develop severe pain, fever, or increased redness.   You have pain that radiates below the knee.  EXERCISES STRETCHING EXERCISES - Trochantic Bursitis  These exercises may help you when beginning to rehabilitate your injury. Your symptoms may resolve with or without further involvement from your physician, physical therapist or athletic trainer. While completing these exercises, remember:   Restoring tissue flexibility helps normal motion to return to the joints. This allows healthier, less painful movement and activity.   An effective stretch should be held for at least 30 seconds.   A stretch should never be painful. You should only  feel a gentle lengthening or release in the stretched tissue.  STRETCH - Iliotibial Band  On the floor or bed, lie on your side so your injured leg is on top. Bend your knee and grab your ankle.   Slowly bring your knee back so that your thigh is in line with your trunk. Keep your heel at your buttocks and gently arch your back so your head, shoulders and hips line up.   Slowly lower your leg so that your knee approaches the floor/bed until you feel a gentle stretch on the outside of your thigh. If you do not feel a stretch and your knee will not fall farther, place the heel of your opposite foot on top of your knee and pull your thigh down farther.   Hold this stretch for __________ seconds.   Repeat __________ times. Complete this exercise __________ times per day.  STRETCH - Hamstrings, Supine   Lie on your back. Loop a belt or towel over the ball of your foot as shown.   Straighten your knee and slowly pull on the belt to raise your injured leg. Do not allow the knee to bend. Keep your opposite leg flat on the floor.   Raise the leg until you feel a gentle stretch behind your knee or thigh. Hold this position for __________ seconds.   Repeat __________ times. Complete this stretch __________ times per day.  STRETCH - Quadriceps, Prone   Lie on your stomach on a firm surface, such as a bed or padded floor.   Bend your knee and grasp your   ankle. If you are unable to reach, your ankle or pant leg, use a belt around your foot to lengthen your reach.   Gently pull your heel toward your buttocks. Your knee should not slide out to the side. You should feel a stretch in the front of your thigh and/or knee.   Hold this position for __________ seconds.   Repeat __________ times. Complete this stretch __________ times per day.  STRETCHING - Hip Flexors, Lunge Half kneel with your knee on the floor and your opposite knee bent and directly over your ankle.  Keep good posture with your head  over your shoulders. Tighten your buttocks to point your tailbone downward; this will prevent your back from arching too much.   You should feel a gentle stretch in the front of your thigh and/or hip. If you do not feel any resistance, slightly slide your opposite foot forward and then slowly lunge forward so your knee once again lines up over your ankle. Be sure your tailbone remains pointed downward.   Hold this stretch for __________ seconds.   Repeat __________ times. Complete this stretch __________ times per day.  STRETCH - Adductors, Lunge  While standing, spread your legs   Lean away from your injured leg by bending your opposite knee. You may rest your hands on your thigh for balance.   You should feel a stretch in your inner thigh. Hold for __________ seconds.   Repeat __________ times. Complete this exercise __________ times per day.  Document Released: 03/11/2004 Document Revised: 01/21/2011 Document Reviewed: 05/16/2008 [facility] Patient Information 2012 [place], [place].Myofascial Pain Syndrome Myofascial pain syndrome is a pain disorder. This pain may be felt in the muscles. It may come and go. Myofascial pain syndrome always has trigger or tender points in the muscle that will cause pain when pressed.  CAUSES Myofascial pain may be caused by injuries, especially auto accidents, or by overuse of certain muscles. Typically the pain is long lasting. It is made worse by overuse of the involved muscles, emotional distress, and by cold, damp weather. Myofascial pain syndrome often develops in patients whose response to stress is an increase in muscle tone, and is seen in greater frequency in patients with pre-existing tension headaches. SYMPTOMS  Myofascial pain syndrome causes a wide variety of symptoms. You may see tight ropy bands of muscle. Problems may also include aching, cramping, burning, numbness, tingling, and other uncomfortable sensations in muscular areas. It most commonly  affects the neck, upper back, and shoulder areas. Pain often radiates into the arms and hands.  TREATMENT Treatment includes resting the affected muscular area and applying ice packs to reduce spasm and pain. Trigger point injection, is a valuable initial therapy. This therapy is an injection of local anesthetic directly into the trigger point. Trigger points are often present at the source of pain. Pain relief following injection confirms the diagnosis of myofascial pain syndrome. Fairly vigorous therapy can be carried out during the pain-free period after each injection. Stretching exercises to loosen up the muscles are also useful. Transcutaneous electrical nerve stimulation (TENS) may provide relief from pain. TENS is the use of electric current produced by a device to stimulate the nerves. Ultrasound therapy applied directly over the affected muscle may also provide pain relief. Anti-inflammatory pain medicine can be helpful. Symptoms will gradually improve over a period of weeks to months with proper treatment. HOME CARE INSTRUCTIONS Call your caregiver for follow-up care as recommended.  SEEK MEDICAL CARE IF:  Your pain is  severe and not helped with medications. Document Released: 03/11/2004 Document Revised: 01/21/2011 Document Reviewed: 03/20/2010 [facility] Patient Information 2012 [place], [place].

## 2011-04-21 NOTE — Progress Notes (Signed)
  Subjective:    Patient ID: Sandra [name], female    DOB: 1970-11-28, 41 y.o.   MRN: [id number]  HPI  Headaches about the same.  Having some tightness in right arm and she wonders if it's from the VPA.  Back remains tight, worsening with activity.  Meloxicam didn't make a big difference. She had her xrays done, but we didn't receive results.  Headache seems to originating from the right neck and radiating to the right eye.  She's doing more activity but not consistently.  She's not consistent with her stretching.  Pain Inventory Average Pain 3 Pain Right Now 5 My pain is constant and aching  In the last 24 hours, has pain interfered with the following? General activity 5 Relation with others 4 Enjoyment of life 5 What TIME of day is your pain at its worst? Morning and Evening Sleep (in general) Fair  Pain is worse with: walking, bending and standing Pain improves with: rest, medication and injections Relief from Meds: 7  Mobility walk without assistance ability to climb steps?  yes do you drive?  yes  Function not employed: date last employed 2000 I need assistance with the following:  household duties  Neuro/Psych anxiety  Prior Studies Any changes since last visit?  no  Physicians involved in your [name] Any changes since last visit?  no      Review of Systems  Constitutional: Positive for unexpected weight change.  HENT: Negative.   Eyes: Negative.   Respiratory: Negative.   Cardiovascular: Negative.   Gastrointestinal: Positive for nausea, abdominal pain and diarrhea.  Genitourinary: Negative.   Musculoskeletal: Negative.   Skin: Negative.   Neurological: Negative.   Hematological: Negative.   Psychiatric/Behavioral: Negative.        Objective:   Physical Exam        Assessment & Plan:  1. Chronic common migraine intractable -Will titrate her valproic acid to 500 mg twice a day. Observe for Effexor side effects. -Like physical therapy to work  on cervical range of motion. She does have some spastic musculature in the right shoulder and cervical areas especially the trapezius and sternocleidomastoid muscles. Her headaches seem to originate from this area.  2. Lumbar spondylosis, low back pain --I reviewed her lumbar x-rays and they're essentially unremarkable. --Like her to go to outpatient physical therapy to address posture range of motion trunk and core muscle lengthening and stretching. -She also needs to focus on weight loss -Percocet 5/325 was refilled one every 12 hours when necessary #60 today   3. Greater trochanter bursitis -Recommended regular ice and stretching as above. Some simple exercises were provided today.  -Therapy can help address this also.   4. Obesity -Reviewed diet and exercise with the patient today. A lot of her weight gain centers around her most recent pregnancy  5. Myofascial pain. -Patient was given myofascial pain information today. Hi to address this with physical therapy as it relates to #1 above.  -I gave her a prescription for Robaxin 500 mg by mouth every 6 hours when necessary   6. I'll see her back in about 2 months time

## 2011-05-17 ENCOUNTER — Telehealth: Payer: Self-pay | Admitting: *Deleted

## 2011-05-17 NOTE — Telephone Encounter (Signed)
Requesting refill on fioricet. Last fill 03/13/11 by Dr Eber Hong, #20 tablets   Percocet 5/325 1-2 q 6 hrs prn. Unclear how many prescribed. Note says 60, Rx documentation states 20. On 04/21/11

## 2011-05-18 ENCOUNTER — Encounter: Payer: Self-pay | Admitting: Physical Medicine & Rehabilitation

## 2011-05-18 MED ORDER — BUTALBITAL-APAP-CAFFEINE 50-325-40 MG PO TABS: 1.0000 | ORAL_TABLET | Freq: Four times a day (QID) | ORAL | Status: DC | PRN

## 2011-05-18 MED ORDER — OXYCODONE-ACETAMINOPHEN 5-325 MG PO TABS: 1.0000 | ORAL_TABLET | Freq: Four times a day (QID) | ORAL | Status: AC | PRN

## 2011-05-18 NOTE — Telephone Encounter (Signed)
Done

## 2011-05-18 NOTE — Telephone Encounter (Signed)
Rx ready for pickup;reminded to have photo ID. Fioricet e-prescribed.

## 2011-05-27 ENCOUNTER — Telehealth: Payer: Self-pay | Admitting: Physical Medicine & Rehabilitation

## 2011-05-27 NOTE — Telephone Encounter (Signed)
Left VM  To [facility]

## 2011-05-27 NOTE — Telephone Encounter (Signed)
Fioricet, only filled #20.  Why?

## 2011-05-27 NOTE — Telephone Encounter (Signed)
Ms Notte returned my call and I explained to her that the Fiorcet was only for prn use when she has a headache and she has her percocet for other pain when needed. Her reply was "I'll just have to talk to him at my next appointment".

## 2011-05-29 ENCOUNTER — Emergency Department (HOSPITAL_COMMUNITY)
Admission: EM | Admit: 2011-05-29 | Discharge: 2011-05-29 | Disposition: A | Discharge status: Home / Self Care | Payer: Medicaid Other | Attending: Emergency Medicine | Admitting: Emergency Medicine

## 2011-05-29 ENCOUNTER — Emergency Department (HOSPITAL_COMMUNITY)
Admission: EM | Admit: 2011-05-29 | Discharge: 2011-05-29 | Disposition: A | Discharge status: Home / Self Care | Payer: Medicaid Other | Source: Home / Self Care

## 2011-05-29 ENCOUNTER — Encounter (HOSPITAL_BASED_OUTPATIENT_CLINIC_OR_DEPARTMENT_OTHER): Payer: Self-pay | Admitting: *Deleted

## 2011-05-29 ENCOUNTER — Encounter (HOSPITAL_COMMUNITY): Payer: Self-pay | Admitting: Emergency Medicine

## 2011-05-29 DIAGNOSIS — R1110 Vomiting, unspecified: Secondary | ICD-10-CM | POA: Insufficient documentation

## 2011-05-29 DIAGNOSIS — K219 Gastro-esophageal reflux disease without esophagitis: Secondary | ICD-10-CM | POA: Insufficient documentation

## 2011-05-29 DIAGNOSIS — I10 Essential (primary) hypertension: Secondary | ICD-10-CM | POA: Insufficient documentation

## 2011-05-29 DIAGNOSIS — Z79899 Other long term (current) drug therapy: Secondary | ICD-10-CM | POA: Insufficient documentation

## 2011-05-29 DIAGNOSIS — G43909 Migraine, unspecified, not intractable, without status migrainosus: Secondary | ICD-10-CM | POA: Insufficient documentation

## 2011-05-29 DIAGNOSIS — G4730 Sleep apnea, unspecified: Secondary | ICD-10-CM | POA: Insufficient documentation

## 2011-05-29 MED ORDER — PROMETHAZINE HCL 25 MG/ML IJ SOLN
25.0000 mg | Freq: Once | INTRAMUSCULAR | Status: AC
  Administered 2011-05-29: 25 mg via INTRAMUSCULAR
  Filled 2011-05-29: qty 1

## 2011-05-29 MED ORDER — LORAZEPAM 2 MG/ML IJ SOLN
1.0000 mg | Freq: Once | INTRAMUSCULAR | Status: AC
  Administered 2011-05-29: 1 mg via INTRAMUSCULAR
  Filled 2011-05-29: qty 1

## 2011-05-29 MED ORDER — DIPHENHYDRAMINE HCL 50 MG/ML IJ SOLN
25.0000 mg | Freq: Once | INTRAMUSCULAR | Status: AC
  Administered 2011-05-29: 25 mg via INTRAMUSCULAR
  Filled 2011-05-29: qty 1

## 2011-05-29 MED ORDER — KETOROLAC TROMETHAMINE 30 MG/ML IJ SOLN
60.0000 mg | Freq: Once | INTRAMUSCULAR | Status: AC
  Administered 2011-05-29: 60 mg via INTRAMUSCULAR
  Filled 2011-05-29 (×2): qty 1

## 2011-05-29 MED ORDER — DEXAMETHASONE SODIUM PHOSPHATE 10 MG/ML IJ SOLN
10.0000 mg | Freq: Once | INTRAMUSCULAR | Status: AC
  Administered 2011-05-29: 10 mg via INTRAMUSCULAR
  Filled 2011-05-29: qty 1

## 2011-05-29 MED ORDER — SUMATRIPTAN SUCCINATE 6 MG/0.5ML ~~LOC~~ SOLN
6.0000 mg | Freq: Once | SUBCUTANEOUS | Status: AC
  Administered 2011-05-29: 6 mg via SUBCUTANEOUS
  Filled 2011-05-29: qty 0.5

## 2011-05-29 NOTE — Discharge Instructions (Signed)
Migraine Headache  A migraine is very bad pain on one or both sides of your head. The cause of a migraine is not always known. A migraine can be triggered or caused by different things, such as:   Alcohol.   Smoking.   Stress.   Periods (menstruation) in women.   Aged cheeses.   Foods or drinks that contain nitrates, glutamate, aspartame, or tyramine.   Lack of sleep.   Chocolate.   Caffeine.   Hunger.   Medicines, such as nitroglycerine (used to treat chest pain), birth control pills, estrogen, and some blood pressure medicines.  HOME CARE   Many medicines can help migraine pain or keep migraines from coming back. Your doctor can help you decide on a medicine or treatment program.   If you or your child gets a migraine, it may help to lie down in a dark, quiet room.   Keep a headache journal. This may help find out what is causing the headaches. For example, write down:   What you eat and drink.   How much sleep you get.   Any change to your diet or medicines.  GET HELP RIGHT AWAY IF:    The medicine does not work.   The pain begins again.   The neck is stiff.   You have trouble seeing.   The muscles are weak or you lose muscle control.   You have new symptoms.   You lose your balance.   You have trouble walking.   You feel faint or pass out.  MAKE SURE YOU:    Understand these instructions.   Will watch this condition.   Will get help right away if you are not doing well or get worse.  Document Released: 11/11/2007 Document Revised: 01/21/2011 Document Reviewed: 10/07/2008  [employer] Patient Information 2012 [employer].

## 2011-05-29 NOTE — ED Notes (Signed)
No answer when called in WR x2

## 2011-05-29 NOTE — ED Notes (Signed)
Pt seen and medicated at [facility] before coming to this facility

## 2011-05-29 NOTE — ED Notes (Addendum)
Pt states she has had a migraine since last Friday. Took Imitrex Mon and yesterday without relief. PERL. States she normally gets a "combination of drugs IV when she comes to the ED" +N/V

## 2011-05-29 NOTE — ED Notes (Signed)
Pt c/o HA R sided with neck stiffness, pt states this is similar to previous migraines, however they do not usually last this. +n/v, denies photophobia.

## 2011-05-29 NOTE — ED Provider Notes (Signed)
History     CSN: [id number]  Arrival date & time 05/29/11  1326   First MD Initiated Contact with Patient 05/29/11 1331      Chief Complaint  Patient presents with  . Migraine    (Consider location/radiation/quality/duration/timing/severity/associated sxs/prior treatment) HPI Comments: Pt states that this is similar to previous migraines and she has not been able to get the headache to break with at home medications  Patient is a 41 y.o. female presenting with migraine. The history is provided by the patient. No language interpreter was used.  Migraine This is a recurrent problem. The current episode started in the past 7 days. The problem occurs constantly. The problem has been unchanged. Associated symptoms include vomiting. Pertinent negatives include no fever. Exacerbated by: ligt. Treatments tried: percocet, imitrex, fioricet.    Past Medical History  Diagnosis Date  . Migraines   . GERD (gastroesophageal reflux disease)   . Gastroparesis   . Hypertension   . Kidney stone   . Sleep apnea     Past Surgical History  Procedure Date  . Dilation and curettage of uterus   . Tubal ligation   . Cesarean section     Family History  Problem Relation Age of Onset  . Diabetes Mother   . Hypertension Mother     History  Substance Use Topics  . Smoking status: Never Smoker   . Smokeless tobacco: Not on file  . Alcohol Use: No    OB History    Grav Para Term Preterm Abortions TAB SAB Ect Mult Living                  Review of Systems  Constitutional: Negative for fever.  Gastrointestinal: Positive for vomiting.  All other systems reviewed and are negative.    Allergies  Iodides and Reglan  Home Medications   Current Outpatient Rx  Name Route Sig Dispense Refill  . ALPRAZOLAM 0.5 MG PO TABS Oral Take 0.5 mg by mouth daily as needed. For anxiety    . BUTALBITAL-APAP-CAFFEINE 50-325-40 MG PO TABS Oral Take 1-2 tablets by mouth every 6 (six) hours as needed  for headache. 20 tablet 0  . BUTALBITAL-APAP-CAFFEINE 50-325-40 MG PO TABS Oral Take 1-2 tablets by mouth every 6 (six) hours as needed for headache. 20 tablet 0  . DEXLANSOPRAZOLE 60 MG PO CPDR Oral Take 60 mg by mouth 2 (two) times daily.    Marland Kitchen DIPHENHYDRAMINE HCL 25 MG PO TABS Oral Take 50 mg by mouth every 4 (four) hours as needed. For runny nose and sneezing    . GABAPENTIN 800 MG PO TABS Oral Take 800 mg by mouth 3 (three) times daily.      . IBUPROFEN 200 MG PO TABS Oral Take 400 mg by mouth 2 (two) times daily as needed. For pain    . LABETALOL HCL 300 MG PO TABS Oral Take 600 mg by mouth 2 (two) times daily.     . MELOXICAM 15 MG PO TABS Oral Take 15 mg by mouth daily.    . OXYCODONE-ACETAMINOPHEN 5-325 MG PO TABS Oral Take 1 tablet by mouth every 6 (six) hours as needed for pain. 60 tablet 0  . PROMETHAZINE HCL 25 MG PO TABS Oral Take 25 mg by mouth every 6 (six) hours as needed. For nausea     . VALPROIC ACID 250 MG PO CAPS Oral Take 2 capsules (500 mg total) by mouth 2 (two) times daily. 60 capsule 5  . ZOLPIDEM  TARTRATE 10 MG PO TABS Oral Take 10 mg by mouth at bedtime as needed. For sleep      BP 146/102  Pulse 94  Temp(Src) 98.2 F (36.8 C) (Oral)  Resp 20  Ht 5\' 4"  (1.626 m)  Wt 220 lb (99.791 kg)  BMI 37.76 kg/m2  SpO2 97%  LMP 05/19/2011  Physical Exam  Nursing note and vitals reviewed. Constitutional: She is oriented to person, place, and time. She appears well-developed and well-nourished.  HENT:  Head: Normocephalic and atraumatic.  Right Ear: External ear normal.  Left Ear: External ear normal.  Eyes: Conjunctivae and EOM are normal. Pupils are equal, round, and reactive to light.  Neck: Neck supple.  Cardiovascular: Normal rate and regular rhythm.   Pulmonary/Chest: Effort normal and breath sounds normal.  Musculoskeletal: Normal range of motion.  Neurological: She is alert and oriented to person, place, and time.  Skin: Skin is warm and dry.    Psychiatric: She has a normal mood and affect.    ED Course  Procedures (including critical care time)  Labs Reviewed - No data to display No results found.   1. Migraine       MDM  Pt given multiple non narcotic headache medications here and states that there is no relief but she would like to go home and sleep it off at this time:pt has had recent scripts for fioricet and percocet from pcp;discussed bp with pt        Teressa Lower, NP 05/29/11 1712  Teressa Lower, NP 05/29/11 1825

## 2011-05-30 NOTE — ED Provider Notes (Signed)
History/physical exam/procedure(s) were performed by non-physician practitioner and as supervising physician I was immediately available for consultation/collaboration. I have reviewed all notes and am in agreement with care and plan.   Hilario Quarry, MD 05/30/11 [phone]

## 2011-05-31 ENCOUNTER — Other Ambulatory Visit: Payer: Self-pay | Admitting: Physical Medicine & Rehabilitation

## 2011-06-09 ENCOUNTER — Other Ambulatory Visit: Payer: Self-pay | Admitting: Physical Medicine & Rehabilitation

## 2011-06-21 ENCOUNTER — Encounter: Payer: Medicaid Other | Attending: Physical Medicine & Rehabilitation | Admitting: Physical Medicine & Rehabilitation

## 2011-06-21 ENCOUNTER — Other Ambulatory Visit: Payer: Self-pay | Admitting: Physical Medicine & Rehabilitation

## 2011-06-21 ENCOUNTER — Encounter: Payer: Self-pay | Admitting: Physical Medicine & Rehabilitation

## 2011-06-21 VITALS — BP 120/73 | HR 90 | Ht 64.0 in | Wt 221.0 lb

## 2011-06-21 DIAGNOSIS — M47816 Spondylosis without myelopathy or radiculopathy, lumbar region: Secondary | ICD-10-CM

## 2011-06-21 DIAGNOSIS — G43909 Migraine, unspecified, not intractable, without status migrainosus: Secondary | ICD-10-CM | POA: Insufficient documentation

## 2011-06-21 DIAGNOSIS — M7918 Myalgia, other site: Secondary | ICD-10-CM

## 2011-06-21 DIAGNOSIS — M76899 Other specified enthesopathies of unspecified lower limb, excluding foot: Secondary | ICD-10-CM | POA: Insufficient documentation

## 2011-06-21 DIAGNOSIS — M47817 Spondylosis without myelopathy or radiculopathy, lumbosacral region: Secondary | ICD-10-CM | POA: Insufficient documentation

## 2011-06-21 DIAGNOSIS — G2581 Restless legs syndrome: Secondary | ICD-10-CM | POA: Insufficient documentation

## 2011-06-21 DIAGNOSIS — IMO0001 Reserved for inherently not codable concepts without codable children: Secondary | ICD-10-CM | POA: Insufficient documentation

## 2011-06-21 DIAGNOSIS — M7060 Trochanteric bursitis, unspecified hip: Secondary | ICD-10-CM

## 2011-06-21 MED ORDER — BUTALBITAL-APAP-CAFFEINE 50-325-40 MG PO TABS: 1.0000 | ORAL_TABLET | Freq: Two times a day (BID) | ORAL | Status: DC | PRN

## 2011-06-21 MED ORDER — GABAPENTIN 800 MG PO TABS: 800.0000 mg | ORAL_TABLET | Freq: Four times a day (QID) | ORAL | Status: DC

## 2011-06-21 MED ORDER — OXYCODONE-ACETAMINOPHEN 5-325 MG PO TABS: 1.0000 | ORAL_TABLET | Freq: Four times a day (QID) | ORAL | Status: DC | PRN

## 2011-06-21 NOTE — Patient Instructions (Signed)
CALL ME IF ANY QUESTIONS OR PROBLEMS

## 2011-06-21 NOTE — Progress Notes (Signed)
Subjective:    Patient ID: Sandra [name], female    DOB: 1970-11-05, 41 y.o.   MRN: [id number]  HPI  [name] is back regarding her headaches and low back pain. None of the medication changes helped. She stopped the VPA due to SE and ineffectiveness. The fioricet helps her headaches. The percocet helps all of her pain. Her headache is usually right-sided.  Her neck is tight as well as her low back.  She didn't make it over to PT and she forgot to call us to find out what happened. The back tends to bother her more when she stands for prolonged periods of time or when she's active.  The robaxin is helpful for muscle spasms but is transient in benefit.  Headaches often originate from her right neck region.   There are a lot of stressors at home, including the fact that her husband lost his job and is still Psychologist, educational. She raises 6 kids as well.    Pain Inventory Average Pain 6 Pain Right Now 6 My pain is constant, sharp, dull and aching  In the last 24 hours, has pain interfered with the following? General activity 6 Relation with others 6 Enjoyment of life 6 What TIME of day is your pain at its worst? day thru evening Sleep (in general) Fair  Pain is worse with: bending, sitting and standing Pain improves with: rest, medication and injections Relief from Meds: 5  Mobility walk without assistance ability to climb steps?  yes do you drive?  yes Do you have any goals in this area?  no  Function not employed: date last employed 2000  Neuro/Psych anxiety  Prior Studies Any changes since last visit?  no  Physicians involved in your [name] Any changes since last visit?  no        Review of Systems  Constitutional: Positive for diaphoresis.  HENT: Negative.   Eyes: Negative.   Respiratory: Negative.   Cardiovascular: Negative.   Gastrointestinal: Positive for nausea and vomiting.  Genitourinary: Negative.  Negative for vaginal pain.  Musculoskeletal: Negative.     Skin: Negative.   Neurological: Negative.   Hematological: Negative.   Psychiatric/Behavioral: Negative.        Objective:   Physical Exam  Constitutional: She is oriented to person, place, and time. She appears well-developed and well-nourished.       obese  HENT:  Head: Normocephalic and atraumatic.  Nose: Nose normal.  Mouth/Throat: Oropharynx is clear and moist.  Eyes: Conjunctivae and EOM are normal. Pupils are equal, round, and reactive to light.  Cardiovascular: Normal rate and regular rhythm.   Pulmonary/Chest: Effort normal and breath sounds normal.  Abdominal: Bowel sounds are normal. She exhibits no distension. There is no tenderness.  Musculoskeletal:       Spastic bands of muscle particularly in the right L3-5 paraspinals and right levator scapulae.  Posture is fair, but she may be a bit rotated to the righ especially in the lumbar spine.  She was able to bend and easily touch her toes. Cervical bending and rotation to the right did cause some pain.  Neurological: She is alert and oriented to person, place, and time. She has normal strength and normal reflexes. No sensory deficit. She displays a negative Romberg sign.  Skin: Skin is warm.  Psychiatric: She has a normal mood and affect. Her behavior is normal. Judgment and thought content normal.          Assessment & Plan:  1. Chronic common  migraine intractable  -Will titrate neurontin to 800 qid. Continue off of the VPA  -Would Like physical therapy to work on cervical range of motion. She does have some spastic musculature in the right shoulder and cervical areas especially the trapezius and levator scapulae today. Her headaches seem to originate from this area.  2. Lumbar spondylosis, low back pain  -- see #5.  --would stil like her to go to outpatient physical therapy to address posture range of motion trunk and core muscle lengthening and stretching.  -She also needs to focus on weight loss  -Percocet  5/325 was refilled one every 12 hours when necessary #60 today  3. Greater trochanter bursitis  -Recommended regular ice and stretching as above. Some simple exercises were provided today.  -Therapy can help address this also.  4. Obesity  -Reviewed diet and exercise with the patient today. A lot of her weight gain centers around her most recent pregnancy  5. Myofascial pain.  -after informed consent i injected the right lower lumbar paraspinals (2 injections) and the right levator scapulae (i injection) each with 2cc 1%lidocaine. Areas were prepped with isopropyl alcohol.  Pt tolerated well.  -continue with Robaxin 500 mg by mouth every 6 hours when necessary  6. I'll see her back in about 2 months time

## 2011-07-04 ENCOUNTER — Other Ambulatory Visit: Payer: Self-pay | Admitting: Physical Medicine & Rehabilitation

## 2011-07-15 ENCOUNTER — Telehealth: Payer: Self-pay

## 2011-07-15 NOTE — Telephone Encounter (Signed)
Lm informing pt PT has been ordered and they should be contacting her with appt info.  Also her percocet is not due to be filled until the 6th so she needs to call closer to then.  Her fioricet is as needed and she should not be running out by the end of the month if she takes it as directed.  Advised to her to discuss changing directions at her next appt.

## 2011-07-15 NOTE — Telephone Encounter (Signed)
Pt called to find out status of PT order.  She also needs a refill of her percocet and has questions on her fioricet directions.

## 2011-07-20 ENCOUNTER — Telehealth: Payer: Self-pay | Admitting: Physical Medicine & Rehabilitation

## 2011-07-20 DIAGNOSIS — G2581 Restless legs syndrome: Secondary | ICD-10-CM

## 2011-07-20 DIAGNOSIS — M7918 Myalgia, other site: Secondary | ICD-10-CM

## 2011-07-20 DIAGNOSIS — M7060 Trochanteric bursitis, unspecified hip: Secondary | ICD-10-CM

## 2011-07-20 DIAGNOSIS — G43909 Migraine, unspecified, not intractable, without status migrainosus: Secondary | ICD-10-CM

## 2011-07-20 DIAGNOSIS — M47816 Spondylosis without myelopathy or radiculopathy, lumbar region: Secondary | ICD-10-CM

## 2011-07-20 MED ORDER — OXYCODONE-ACETAMINOPHEN 5-325 MG PO TABS: 1.0000 | ORAL_TABLET | Freq: Four times a day (QID) | ORAL | Status: DC | PRN

## 2011-07-20 NOTE — Telephone Encounter (Signed)
Refill on Percocet. 

## 2011-07-20 NOTE — Telephone Encounter (Signed)
Rx printed for Kirsteins to sign.

## 2011-07-21 NOTE — Telephone Encounter (Signed)
Rx is ready for pick up, pt aware. 

## 2011-08-10 ENCOUNTER — Ambulatory Visit: Payer: Medicaid Other | Attending: Physical Medicine & Rehabilitation

## 2011-08-10 DIAGNOSIS — M2560 Stiffness of unspecified joint, not elsewhere classified: Secondary | ICD-10-CM | POA: Insufficient documentation

## 2011-08-10 DIAGNOSIS — R5381 Other malaise: Secondary | ICD-10-CM | POA: Insufficient documentation

## 2011-08-10 DIAGNOSIS — IMO0001 Reserved for inherently not codable concepts without codable children: Secondary | ICD-10-CM | POA: Insufficient documentation

## 2011-08-10 DIAGNOSIS — M2550 Pain in unspecified joint: Secondary | ICD-10-CM | POA: Insufficient documentation

## 2011-08-20 ENCOUNTER — Encounter: Payer: Self-pay | Admitting: Physical Medicine & Rehabilitation

## 2011-08-20 ENCOUNTER — Encounter: Payer: Medicaid Other | Attending: Physical Medicine & Rehabilitation | Admitting: Physical Medicine & Rehabilitation

## 2011-08-20 VITALS — BP 120/67 | HR 77 | Resp 14 | Ht 64.0 in | Wt 223.0 lb

## 2011-08-20 DIAGNOSIS — M47817 Spondylosis without myelopathy or radiculopathy, lumbosacral region: Secondary | ICD-10-CM | POA: Insufficient documentation

## 2011-08-20 DIAGNOSIS — IMO0001 Reserved for inherently not codable concepts without codable children: Secondary | ICD-10-CM | POA: Insufficient documentation

## 2011-08-20 DIAGNOSIS — G2581 Restless legs syndrome: Secondary | ICD-10-CM | POA: Insufficient documentation

## 2011-08-20 DIAGNOSIS — G43909 Migraine, unspecified, not intractable, without status migrainosus: Secondary | ICD-10-CM | POA: Insufficient documentation

## 2011-08-20 DIAGNOSIS — M7918 Myalgia, other site: Secondary | ICD-10-CM

## 2011-08-20 DIAGNOSIS — M47816 Spondylosis without myelopathy or radiculopathy, lumbar region: Secondary | ICD-10-CM

## 2011-08-20 DIAGNOSIS — M7060 Trochanteric bursitis, unspecified hip: Secondary | ICD-10-CM

## 2011-08-20 DIAGNOSIS — M76899 Other specified enthesopathies of unspecified lower limb, excluding foot: Secondary | ICD-10-CM | POA: Insufficient documentation

## 2011-08-20 MED ORDER — OXYCODONE-ACETAMINOPHEN 5-325 MG PO TABS: 1.0000 | ORAL_TABLET | Freq: Four times a day (QID) | ORAL | Status: DC | PRN

## 2011-08-20 MED ORDER — METHOCARBAMOL 750 MG PO TABS: 750.0000 mg | ORAL_TABLET | Freq: Four times a day (QID) | ORAL | Status: DC | PRN

## 2011-08-20 NOTE — Progress Notes (Signed)
Subjective:    Patient ID: Sandra [name], female    DOB: December 09, 1970, 41 y.o.   MRN: [id number]  HPI  Sandra [name] is back regarding her chronic pain. She complains of tingling in her first two fingers of the right hand. Her headache is worse with her menstral cycle. The neurontin did not have a big impact. Her OB/GYN gave her Fioricet for breakthrough headaches. She is noting that her menstrual cycle has been more irregular and that her periods have been more substantial also.  The oxycodone does help her pain somewhat. She tells me that she stretches about 20 minutes 3 days a week. She is trying to do some walking. Her exercise however, is not regular.Marland Kitchen Her hips are feeling a bit better. The trigger point shots in her back really not overly beneficial.   Pain Inventory Average Pain 5 Pain Right Now 6 My pain is constant, sharp, burning, dull and aching  In the last 24 hours, has pain interfered with the following? General activity 7 Relation with others 5 Enjoyment of life 6 What TIME of day is your pain at its worst? evening Sleep (in general) Fair  Pain is worse with: walking, bending, sitting and standing Pain improves with: rest, medication and injections Relief from Meds: 7  Mobility Do you have any goals in this area?  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  yes  Neuro/Psych numbness spasms anxiety  Prior Studies Any changes since last visit?  no  Physicians involved in your [name] Any changes since last visit?  no   Family History  Problem Relation Age of Onset  . Diabetes Mother   . Hypertension Mother    History   Social History  . Marital Status: Married    Spouse Name: N/A    Number of Children: N/A  . Years of Education: N/A   Social History Main Topics  . Smoking status: Never Smoker   . Smokeless tobacco: None  . Alcohol Use: No  .  Drug Use: No  . Sexually Active: Yes    Birth Control/ Protection: Surgical   Other Topics Concern  . None   Social History Narrative  . None   Past Surgical History  Procedure Date  . Dilation and curettage of uterus   . Tubal ligation   . Cesarean section    Past Medical History  Diagnosis Date  . Migraines   . GERD (gastroesophageal reflux disease)   . Gastroparesis   . Hypertension   . Kidney stone   . Sleep apnea    BP 120/67  Pulse 77  Resp 14  Ht 5\' 4"  (1.626 m)  Wt 223 lb (101.152 kg)  BMI 38.28 kg/m2  SpO2 98%     Review of Systems  Musculoskeletal: Positive for back pain and arthralgias.  Neurological: Positive for numbness.  Psychiatric/Behavioral: The patient is nervous/anxious.   All other systems reviewed and are negative.       Objective:   Physical Exam Constitutional: She is oriented to person, place, and time. She appears well-developed and well-nourished.  obese  HENT:  Head: Normocephalic and atraumatic.  Nose: Nose normal.  Mouth/Throat: Oropharynx is clear and moist.  Eyes: Conjunctivae and EOM are normal. Pupils are equal, round, and reactive to light.  Cardiovascular: Normal rate and regular rhythm.  Pulmonary/Chest: Effort normal and breath sounds normal.  Abdominal:  Bowel sounds are normal. She exhibits no distension. There is no tenderness.  Musculoskeletal:  Spastic bands of muscle particularly in the right L3-5 paraspinals and right levator scapulae. Posture is fair, but she may be a bit rotated to the righ especially in the lumbar spine.  She was able to bend and easily touch her toes. Cervical bending and rotation to the right did cause some pain.  Neurological: She is alert and oriented to person, place, and time. She has normal strength and normal reflexes. No sensory deficit. She displays a negative Romberg sign.  Skin: Skin is warm.  Psychiatric: She has a normal mood and affect. Her behavior is normal. Judgment and  thought content normal.     Assessment & Plan:   1. Chronic common migraine intractable  -Continue neurontin 800mg  qid. Don't want to add another agent at this point. -she may want to talk with her GYN regarding rx of her hormonal swings as they seem to coorelate with her headaches. I have found estrogen patches the week prior/overlapping to period, helpful in rx'ng these headaches. -Would Like physical therapy to work on cervical and shoulder range of motion. I think this is paramount given her symptoms.  2. Lumbar spondylosis, low back pain  -- see #5 again.  --continue with outpatient physical therapy to address posture range of motion trunk and core muscle lengthening and stretching.  -She still needs to focus on weight loss  -Percocet 5/325 was refilled one every 12 hours when necessary #75 today  3. Greater trochanter bursitis  -Recommended regular ice and stretching as above.  -Therapy can help address this also.  4. Obesity-reviewed exercise and diet again. 5. Myofascial pain.  -discussed regular stretching, posture, and exercise. -increase Robaxin to 750 mg by mouth every 6 hours when necessary  6. I'll see her back in about 2 months time. At this point, she needs to make a commitment to improve her posture, diet, and exercise levels. I believe these are at the core of her ongoing pain issues. She admits to not being consistent with her exercise program.

## 2011-08-20 NOTE — Patient Instructions (Signed)
REGULAR RANGE OF MOTION EXERCISES, AEROBIC EXERCISE, AND POSTURE ARE CRUCIAL!!!!

## 2011-08-23 ENCOUNTER — Ambulatory Visit: Payer: Medicaid Other | Attending: Physical Medicine & Rehabilitation | Admitting: Physical Therapy

## 2011-08-23 DIAGNOSIS — M2560 Stiffness of unspecified joint, not elsewhere classified: Secondary | ICD-10-CM | POA: Insufficient documentation

## 2011-08-23 DIAGNOSIS — IMO0001 Reserved for inherently not codable concepts without codable children: Secondary | ICD-10-CM | POA: Insufficient documentation

## 2011-08-23 DIAGNOSIS — R5381 Other malaise: Secondary | ICD-10-CM | POA: Insufficient documentation

## 2011-08-23 DIAGNOSIS — M2550 Pain in unspecified joint: Secondary | ICD-10-CM | POA: Insufficient documentation

## 2011-08-27 ENCOUNTER — Other Ambulatory Visit: Payer: Self-pay | Admitting: Physical Medicine & Rehabilitation

## 2011-08-30 ENCOUNTER — Encounter: Payer: Medicaid Other | Admitting: Physical Therapy

## 2011-09-06 ENCOUNTER — Ambulatory Visit: Payer: Medicaid Other | Admitting: Rehabilitative and Restorative Service Providers"

## 2011-09-07 ENCOUNTER — Encounter: Payer: Medicaid Other | Admitting: Rehabilitative and Restorative Service Providers"

## 2011-09-13 ENCOUNTER — Encounter: Payer: Medicaid Other | Admitting: Physical Therapy

## 2011-09-15 ENCOUNTER — Telehealth: Payer: Self-pay | Admitting: Physical Medicine & Rehabilitation

## 2011-09-15 DIAGNOSIS — M7918 Myalgia, other site: Secondary | ICD-10-CM

## 2011-09-15 DIAGNOSIS — M47816 Spondylosis without myelopathy or radiculopathy, lumbar region: Secondary | ICD-10-CM

## 2011-09-15 DIAGNOSIS — G2581 Restless legs syndrome: Secondary | ICD-10-CM

## 2011-09-15 DIAGNOSIS — G43909 Migraine, unspecified, not intractable, without status migrainosus: Secondary | ICD-10-CM

## 2011-09-15 DIAGNOSIS — M7060 Trochanteric bursitis, unspecified hip: Secondary | ICD-10-CM

## 2011-09-15 MED ORDER — OXYCODONE-ACETAMINOPHEN 5-325 MG PO TABS: 1.0000 | ORAL_TABLET | Freq: Four times a day (QID) | ORAL | Status: DC | PRN

## 2011-09-15 NOTE — Telephone Encounter (Signed)
Refill on Percocet. 

## 2011-09-15 NOTE — Telephone Encounter (Signed)
Pt aware that we are getting rx ready for her to pick up

## 2011-10-08 ENCOUNTER — Other Ambulatory Visit: Payer: Self-pay | Admitting: Physical Medicine & Rehabilitation

## 2011-10-13 ENCOUNTER — Telehealth: Payer: Self-pay | Admitting: Physical Medicine & Rehabilitation

## 2011-10-13 DIAGNOSIS — M47817 Spondylosis without myelopathy or radiculopathy, lumbosacral region: Secondary | ICD-10-CM

## 2011-10-13 NOTE — Telephone Encounter (Signed)
TENS unit has been ordered and will be faxed today.

## 2011-10-13 NOTE — Telephone Encounter (Signed)
Needs Rx for 1 visit to get TENS unit.  Please order and fax to [phone] Attn:  Alvera Singh

## 2011-10-18 ENCOUNTER — Emergency Department (HOSPITAL_BASED_OUTPATIENT_CLINIC_OR_DEPARTMENT_OTHER)
Admission: EM | Admit: 2011-10-18 | Discharge: 2011-10-18 | Disposition: A | Discharge status: Home / Self Care | Payer: Medicaid Other | Attending: Emergency Medicine | Admitting: Emergency Medicine

## 2011-10-18 ENCOUNTER — Emergency Department (HOSPITAL_BASED_OUTPATIENT_CLINIC_OR_DEPARTMENT_OTHER): Payer: Medicaid Other

## 2011-10-18 ENCOUNTER — Encounter (HOSPITAL_BASED_OUTPATIENT_CLINIC_OR_DEPARTMENT_OTHER): Payer: Self-pay | Admitting: Emergency Medicine

## 2011-10-18 DIAGNOSIS — B9789 Other viral agents as the cause of diseases classified elsewhere: Secondary | ICD-10-CM | POA: Insufficient documentation

## 2011-10-18 DIAGNOSIS — K219 Gastro-esophageal reflux disease without esophagitis: Secondary | ICD-10-CM | POA: Insufficient documentation

## 2011-10-18 DIAGNOSIS — G4730 Sleep apnea, unspecified: Secondary | ICD-10-CM | POA: Insufficient documentation

## 2011-10-18 DIAGNOSIS — Z79899 Other long term (current) drug therapy: Secondary | ICD-10-CM | POA: Insufficient documentation

## 2011-10-18 DIAGNOSIS — B349 Viral infection, unspecified: Secondary | ICD-10-CM

## 2011-10-18 DIAGNOSIS — J9801 Acute bronchospasm: Secondary | ICD-10-CM | POA: Insufficient documentation

## 2011-10-18 DIAGNOSIS — G8929 Other chronic pain: Secondary | ICD-10-CM | POA: Insufficient documentation

## 2011-10-18 DIAGNOSIS — I10 Essential (primary) hypertension: Secondary | ICD-10-CM | POA: Insufficient documentation

## 2011-10-18 HISTORY — DX: Restless legs syndrome: G25.81

## 2011-10-18 MED ORDER — PREDNISONE 50 MG PO TABS
60.0000 mg | ORAL_TABLET | Freq: Once | ORAL | Status: AC
  Administered 2011-10-18: 60 mg via ORAL
  Filled 2011-10-18: qty 1

## 2011-10-18 MED ORDER — SODIUM CHLORIDE 0.9 % IV BOLUS (SEPSIS)
2000.0000 mL | Freq: Once | INTRAVENOUS | Status: AC
  Administered 2011-10-18: 1000 mL via INTRAVENOUS

## 2011-10-18 MED ORDER — SODIUM CHLORIDE 0.9 % IV SOLN
INTRAVENOUS | Status: DC
  Administered 2011-10-18: 11:00:00 via INTRAVENOUS

## 2011-10-18 MED ORDER — IPRATROPIUM BROMIDE 0.02 % IN SOLN
0.5000 mg | Freq: Once | RESPIRATORY_TRACT | Status: AC
  Administered 2011-10-18: 0.5 mg via RESPIRATORY_TRACT
  Filled 2011-10-18: qty 2.5

## 2011-10-18 MED ORDER — ALBUTEROL SULFATE (5 MG/ML) 0.5% IN NEBU
5.0000 mg | INHALATION_SOLUTION | Freq: Once | RESPIRATORY_TRACT | Status: AC
  Administered 2011-10-18: 5 mg via RESPIRATORY_TRACT
  Filled 2011-10-18: qty 1

## 2011-10-18 MED ORDER — OXYCODONE-ACETAMINOPHEN 5-325 MG PO TABS: 1.0000 | ORAL_TABLET | Freq: Four times a day (QID) | ORAL | Status: DC | PRN

## 2011-10-18 MED ORDER — PREDNISONE 20 MG PO TABS: ORAL_TABLET | ORAL | Status: AC

## 2011-10-18 MED ORDER — ONDANSETRON HCL 4 MG/2ML IJ SOLN
4.0000 mg | Freq: Once | INTRAMUSCULAR | Status: AC
  Administered 2011-10-18: 4 mg via INTRAVENOUS
  Filled 2011-10-18: qty 2

## 2011-10-18 MED ORDER — ALBUTEROL SULFATE HFA 108 (90 BASE) MCG/ACT IN AERS: 2.0000 | INHALATION_SPRAY | RESPIRATORY_TRACT | Status: DC | PRN

## 2011-10-18 NOTE — ED Provider Notes (Signed)
History     CSN: [id number]  Arrival date & time 10/18/11  0865   First MD Initiated Contact with Patient 10/18/11 [phone]      Chief Complaint  Patient presents with  . Fever  . Cough    (Consider location/radiation/quality/duration/timing/severity/associated sxs/prior treatment) HPI This 41 year old female yesterday developed fever cough and shortness of breath with generalized body aches and some fatigue. There is no altered mental status, there is no rash, no abdominal pain vomiting or diarrhea, or severe headache. The patient is able to take oral fluids. The patient drove herself to the emergency room today. She has had some nausea. There is no treatment prior to arrival. She ran out of her Percocet for her chronic pain and has a recheck at her chronic pain doctor tomorrow. She does not feel like she is in withdrawal. Past Medical History  Diagnosis Date  . Migraines   . GERD (gastroesophageal reflux disease)   . Gastroparesis   . Hypertension   . Kidney stone   . Sleep apnea   . Restless legs    low back pain, chronic pain  Past Surgical History  Procedure Date  . Dilation and curettage of uterus   . Tubal ligation   . Cesarean section     Family History  Problem Relation Age of Onset  . Diabetes Mother   . Hypertension Mother     History  Substance Use Topics  . Smoking status: Never Smoker   . Smokeless tobacco: Never Used  . Alcohol Use: No    OB History    Grav Para Term Preterm Abortions TAB SAB Ect Mult Living                  Review of Systems 10 Systems reviewed and are negative for acute change except as noted in the HPI. Allergies  Iodides; Imitrex; and Metoclopramide hcl  Home Medications   Current Outpatient Rx  Name Route Sig Dispense Refill  . ALBUTEROL SULFATE HFA 108 (90 BASE) MCG/ACT IN AERS Inhalation Inhale 2 puffs into the lungs every 2 (two) hours as needed for wheezing or shortness of breath (cough). 1 Inhaler 0  . ALPRAZOLAM  0.5 MG PO TABS Oral Take 0.5 mg by mouth daily as needed. For anxiety    . BUTALBITAL-APAP-CAFFEINE 50-325-40 MG PO TABS  TAKE 1 TABLET BY MOUTH TWICE A DAY AS NEEDED FOR HEADACHE **INS WILL COVER 07/20/11** 60 tablet 1  . DEXLANSOPRAZOLE 60 MG PO CPDR Oral Take 60 mg by mouth 2 (two) times daily.    Marland Kitchen DIPHENHYDRAMINE HCL 25 MG PO TABS Oral Take 50 mg by mouth every 4 (four) hours as needed. For runny nose and sneezing    . FROVATRIPTAN SUCCINATE 2.5 MG PO TABS Oral Take 1 tablet (2.5 mg total) by mouth as needed for migraine. If recurs, may repeat after 2 hours. Max of 3 tabs in 24 hours. 10 tablet 0  . GABAPENTIN 800 MG PO TABS Oral Take 1 tablet (800 mg total) by mouth 4 (four) times daily. 120 tablet 3  . IBUPROFEN 200 MG PO TABS Oral Take 400 mg by mouth 2 (two) times daily as needed. For pain    . LABETALOL HCL 300 MG PO TABS Oral Take 600 mg by mouth 2 (two) times daily.     . MELOXICAM 15 MG PO TABS  TAKE 1 TABLET BY MOUTH EVERY MORNING WITH FOOD 30 tablet 3  . METHOCARBAMOL 500 MG PO TABS Oral Take  1 tablet by mouth 4 (four) times daily as needed.    . OXYCODONE-ACETAMINOPHEN 5-325 MG PO TABS Oral Take 1 tablet by mouth every 6 (six) hours as needed. 90 tablet 0    DNF until 09/20/11  . PREDNISONE 20 MG PO TABS  2 tabs po daily x 4 days 8 tablet 0  . PROMETHAZINE HCL 25 MG PO TABS Oral Take 25 mg by mouth every 6 (six) hours as needed. For nausea     . TOPIRAMATE 25 MG PO TABS Oral Take 1-2 tablets (25-50 mg total) by mouth at bedtime. 60 tablet 3  . ZOLPIDEM TARTRATE 10 MG PO TABS Oral Take 10 mg by mouth at bedtime as needed. For sleep      BP 123/85  Pulse 131  Temp 99.4 F (37.4 C) (Oral)  Resp 20  Ht 5\' 4"  (1.626 m)  Wt 220 lb (99.791 kg)  BMI 37.76 kg/m2  SpO2 99%  LMP 08/16/2011  Physical Exam  Nursing note and vitals reviewed. Constitutional:       Awake, alert, nontoxic appearance.  HENT:  Head: Atraumatic.  Eyes: Right eye exhibits no discharge. Left eye exhibits no  discharge.  Neck: Neck supple.  Cardiovascular: Regular rhythm.   No murmur heard.      Tachycardic  Pulmonary/Chest: She is in respiratory distress. She has wheezes. She has rales. She exhibits tenderness.       Patient is able to speak full sentences with mild respiratory distress, she has decreased breath sounds bilaterally with prolonged expiratory phase with mild scattered expiratory wheezes, she also has some bibasilar crackles  Abdominal: Soft. There is no tenderness. There is no rebound.  Musculoskeletal: She exhibits tenderness. She exhibits no edema.       Baseline ROM, no obvious new focal weakness. Back in all 4 extremities and mildly tender.  Neurological: She is alert.       Mental status and motor strength appears baseline for patient and situation.  Skin: No rash noted.  Psychiatric: She has a normal mood and affect.    ED Course  Procedures (including critical care time)  ECG: Sinus tachycardia, ventricular rate 143, normal axis, normal intervals, nonspecific ST changes, no comparison ECG available Pt feels improved after observation and/or treatment in ED much better air movement with minimal to no wheezing and pulse improving with IV fluids as well. Patient feels okay for discharge without having second liter of fluids. Labs Reviewed - No data to display No results found.   1. Viral syndrome   2. Bronchospasm   3. Chronic pain       MDM         Hurman Horn, MD 10/22/11 2221

## 2011-10-18 NOTE — ED Notes (Signed)
Attempted IV access - unsuccessful.

## 2011-10-18 NOTE — ED Notes (Signed)
States started running fever yesterday associated with cough and body aches.  Cough is non productive and reports some SOB.

## 2011-10-18 NOTE — ED Notes (Signed)
Dose: 1,031mL Route: Intravenous 2nd bag started

## 2011-10-19 ENCOUNTER — Encounter: Payer: Medicaid Other | Attending: Physical Medicine & Rehabilitation | Admitting: Physical Medicine & Rehabilitation

## 2011-10-19 ENCOUNTER — Encounter: Payer: Self-pay | Admitting: Physical Medicine & Rehabilitation

## 2011-10-19 ENCOUNTER — Telehealth: Payer: Self-pay | Admitting: *Deleted

## 2011-10-19 VITALS — BP 113/60 | HR 97 | Resp 14 | Ht 64.0 in | Wt 220.0 lb

## 2011-10-19 DIAGNOSIS — G2581 Restless legs syndrome: Secondary | ICD-10-CM | POA: Insufficient documentation

## 2011-10-19 DIAGNOSIS — M7060 Trochanteric bursitis, unspecified hip: Secondary | ICD-10-CM

## 2011-10-19 DIAGNOSIS — M47816 Spondylosis without myelopathy or radiculopathy, lumbar region: Secondary | ICD-10-CM

## 2011-10-19 DIAGNOSIS — M76899 Other specified enthesopathies of unspecified lower limb, excluding foot: Secondary | ICD-10-CM | POA: Insufficient documentation

## 2011-10-19 DIAGNOSIS — IMO0001 Reserved for inherently not codable concepts without codable children: Secondary | ICD-10-CM | POA: Insufficient documentation

## 2011-10-19 DIAGNOSIS — G43909 Migraine, unspecified, not intractable, without status migrainosus: Secondary | ICD-10-CM | POA: Insufficient documentation

## 2011-10-19 DIAGNOSIS — Z5181 Encounter for therapeutic drug level monitoring: Secondary | ICD-10-CM | POA: Insufficient documentation

## 2011-10-19 DIAGNOSIS — M7918 Myalgia, other site: Secondary | ICD-10-CM

## 2011-10-19 DIAGNOSIS — M47817 Spondylosis without myelopathy or radiculopathy, lumbosacral region: Secondary | ICD-10-CM | POA: Insufficient documentation

## 2011-10-19 MED ORDER — TOPIRAMATE 25 MG PO TABS: 25.0000 mg | ORAL_TABLET | Freq: Every day | ORAL | Status: DC

## 2011-10-19 MED ORDER — OXYCODONE-ACETAMINOPHEN 5-325 MG PO TABS: 1.0000 | ORAL_TABLET | Freq: Four times a day (QID) | ORAL | Status: DC | PRN

## 2011-10-19 MED ORDER — FROVATRIPTAN SUCCINATE 2.5 MG PO TABS: 2.5000 mg | ORAL_TABLET | ORAL | Status: DC | PRN

## 2011-10-19 NOTE — Telephone Encounter (Signed)
PT needs a new TENS Unit order. Please fax to [phone].

## 2011-10-19 NOTE — Telephone Encounter (Signed)
This has been faxed.

## 2011-10-19 NOTE — Patient Instructions (Addendum)
Call me with any problems or questions. Please try to practice good posture each and every day.

## 2011-10-19 NOTE — Progress Notes (Signed)
Subjective:    Patient ID: Sandra [name], female    DOB: [birth year], 41 y.o.   MRN: [id number]  HPI  [name] is back regarding her back pain and headaches. She states that the therapy hasn't helped a whole lot yet, although her therapist has told her it will "take a while". Migraines remain an issue. She did not speak to her GYN regarding hormonal mediation of these headaches. I asked her if she had taken frova before, and she does not recall doing so.  She was in the ED with asthmatic reaction recently and may have had an associated viral infection.  Her husband recently left the home, and she was left with her 6 kids and no income. She wants to try to work at least a little.   Pain Inventory Average Pain 6 Pain Right Now 6 My pain is constant, burning, dull and aching  In the last 24 hours, has pain interfered with the following? General activity 5 Relation with others 5 Enjoyment of life 5 What TIME of day is your pain at its worst? evening Sleep (in general) Fair  Pain is worse with: walking, bending, sitting and some activites Pain improves with: heat/ice, medication, TENS and injections Relief from Meds: 8  Mobility walk without assistance ability to climb steps?  yes do you drive?  yes  Function not employed: date last employed  I need assistance with the following:  meal prep, household duties and shopping  Neuro/Psych spasms anxiety  Prior Studies Any changes since last visit?  no  Physicians involved in your [name] Any changes since last visit?  no   Family History  Problem Relation Age of Onset  . Diabetes Mother   . Hypertension Mother    History   Social History  . Marital Status: Married    Spouse Name: N/A    Number of Children: N/A  . Years of Education: N/A   Social History Main Topics  . Smoking status: Never Smoker   . Smokeless tobacco: Never Used  . Alcohol Use: No  . Drug Use: No  . Sexually Active: Yes    Birth Control/  Protection: Surgical   Other Topics Concern  . None   Social History Narrative  . None   Past Surgical History  Procedure Date  . Dilation and curettage of uterus   . Tubal ligation   . Cesarean section    Past Medical History  Diagnosis Date  . Migraines   . GERD (gastroesophageal reflux disease)   . Gastroparesis   . Hypertension   . Kidney stone   . Sleep apnea   . Restless legs    BP 113/60  Pulse 97  Resp 14  Ht 5\' 4"  (1.626 m)  Wt 220 lb (99.791 kg)  BMI 37.76 kg/m2  SpO2 95%  LMP 08/16/2011    Review of Systems  HENT: Positive for neck pain.   Musculoskeletal: Positive for back pain.       Spasms  Psychiatric/Behavioral: The patient is nervous/anxious.   All other systems reviewed and are negative.       Objective:   Physical Exam Constitutional: She is oriented to person, place, and time. She appears well-developed and well-nourished.  obese  HENT:  Head: Normocephalic and atraumatic.  Nose: Nose normal.  Mouth/Throat: Oropharynx is clear and moist.  Eyes: Conjunctivae and EOM are normal. Pupils are equal, round, and reactive to light.  Cardiovascular: Normal rate and regular rhythm.  Pulmonary/Chest: Effort normal and  breath sounds normal.  Abdominal: Bowel sounds are normal. She exhibits no distension. There is no tenderness.  Musculoskeletal:  She continues to have pain with palpation over the lower lumbar segments, especially on the right.  She was able to bend and easily touch her toes. Cervical bending and rotation to the right did cause some pain.  Neurological: She is alert and oriented to person, place, and time. She has normal strength and normal reflexes. No sensory deficit. She displays a negative Romberg sign.  Skin: Skin is warm.  Psychiatric: She has a normal mood and affect although she is a little flat at times.. Her behavior is normal. Judgment and thought content normal.  Assessment & Plan:   1. Chronic common migraine  intractable  -Continue neurontin 800mg  qid. We will add low dose topamax as an additional prophylactic agent. Start at 25mg  qhs increasing to 50mg  if needed  -she again may want to talk with her GYN regarding rx of her hormonal swings as they seem to coorelate with her headaches. I have found estrogen patches the week prior/overlapping to period, helpful in rx'ng these headaches.  -PT has addressed cervical and shoulder range of motion. I think this is paramount given her symptoms. TENS unit may also help there 2. Lumbar spondylosis, low back pain  -- see #5 again.  --continue with outpatient physical therapy to address posture range of motion trunk and core muscle lengthening and stretching.  -TENS unit trial -She still needs to focus on weight loss-topamax may help a bit here -Percocet 5/325 was refilled one every 12 hours when necessary #90 today  3. Greater trochanter bursitis  -Recommended regular ice and stretching as above.  -Therapy can help address this also.  4. Obesity-reviewed exercise and diet again.  5. Myofascial pain.  -discussed regular stretching, posture, and exercise.  -continue Robaxin to 750 mg by mouth every 6 hours when necessary  6. I'll see her back in about 3 months time. Given her social dynamics, I know it will be hard for her to stay with a regular exercise program as she has A LOT on her plate. I emphasized appropriate posture and stretching when able.

## 2011-10-24 ENCOUNTER — Other Ambulatory Visit: Payer: Self-pay | Admitting: Physical Medicine & Rehabilitation

## 2011-11-17 ENCOUNTER — Telehealth: Payer: Self-pay | Admitting: *Deleted

## 2011-11-17 DIAGNOSIS — G2581 Restless legs syndrome: Secondary | ICD-10-CM

## 2011-11-17 DIAGNOSIS — M7918 Myalgia, other site: Secondary | ICD-10-CM

## 2011-11-17 DIAGNOSIS — G43909 Migraine, unspecified, not intractable, without status migrainosus: Secondary | ICD-10-CM

## 2011-11-17 DIAGNOSIS — M47816 Spondylosis without myelopathy or radiculopathy, lumbar region: Secondary | ICD-10-CM

## 2011-11-17 DIAGNOSIS — M7060 Trochanteric bursitis, unspecified hip: Secondary | ICD-10-CM

## 2011-11-17 MED ORDER — OXYCODONE-ACETAMINOPHEN 5-325 MG PO TABS: 1.0000 | ORAL_TABLET | Freq: Four times a day (QID) | ORAL | Status: DC | PRN

## 2011-11-17 NOTE — Telephone Encounter (Signed)
Rx is ready to be picked up, pt aware. 

## 2011-11-17 NOTE — Telephone Encounter (Signed)
Percocet refill  Printed for Clydie Braun to sign

## 2011-12-13 ENCOUNTER — Other Ambulatory Visit: Payer: Self-pay | Admitting: Physical Medicine & Rehabilitation

## 2011-12-15 ENCOUNTER — Other Ambulatory Visit: Payer: Self-pay | Admitting: Physical Medicine & Rehabilitation

## 2011-12-15 ENCOUNTER — Telehealth: Payer: Self-pay

## 2011-12-15 DIAGNOSIS — M7918 Myalgia, other site: Secondary | ICD-10-CM

## 2011-12-15 DIAGNOSIS — M7060 Trochanteric bursitis, unspecified hip: Secondary | ICD-10-CM

## 2011-12-15 DIAGNOSIS — G2581 Restless legs syndrome: Secondary | ICD-10-CM

## 2011-12-15 DIAGNOSIS — M47816 Spondylosis without myelopathy or radiculopathy, lumbar region: Secondary | ICD-10-CM

## 2011-12-15 DIAGNOSIS — G43909 Migraine, unspecified, not intractable, without status migrainosus: Secondary | ICD-10-CM

## 2011-12-15 MED ORDER — OXYCODONE-ACETAMINOPHEN 5-325 MG PO TABS: 1.0000 | ORAL_TABLET | Freq: Four times a day (QID) | ORAL | Status: DC | PRN

## 2011-12-15 NOTE — Telephone Encounter (Signed)
Percocet called in.  Frova taken care of as well.  Pt aware.

## 2011-12-15 NOTE — Telephone Encounter (Signed)
Patient called to get refills of percocet and soma.

## 2011-12-15 NOTE — Telephone Encounter (Signed)
Lm for patient to call back regarding what pharmacy she wants her meds sent to and to get more information on the soma request.

## 2011-12-16 ENCOUNTER — Telehealth: Payer: Self-pay | Admitting: Physical Medicine & Rehabilitation

## 2011-12-16 DIAGNOSIS — M47816 Spondylosis without myelopathy or radiculopathy, lumbar region: Secondary | ICD-10-CM

## 2011-12-16 DIAGNOSIS — M7060 Trochanteric bursitis, unspecified hip: Secondary | ICD-10-CM

## 2011-12-16 DIAGNOSIS — G43909 Migraine, unspecified, not intractable, without status migrainosus: Secondary | ICD-10-CM

## 2011-12-16 DIAGNOSIS — M7918 Myalgia, other site: Secondary | ICD-10-CM

## 2011-12-16 DIAGNOSIS — G2581 Restless legs syndrome: Secondary | ICD-10-CM

## 2011-12-16 MED ORDER — OXYCODONE-ACETAMINOPHEN 5-325 MG PO TABS: 1.0000 | ORAL_TABLET | Freq: Four times a day (QID) | ORAL | Status: DC | PRN

## 2011-12-16 NOTE — Telephone Encounter (Signed)
Rx printed for Dr. Swartz to sign. 

## 2011-12-16 NOTE — Telephone Encounter (Signed)
Cannot accept phone in for Percocet

## 2011-12-22 ENCOUNTER — Other Ambulatory Visit: Payer: Self-pay | Admitting: Physical Medicine & Rehabilitation

## 2012-01-14 ENCOUNTER — Other Ambulatory Visit: Payer: Self-pay | Admitting: Physical Medicine & Rehabilitation

## 2012-01-17 ENCOUNTER — Other Ambulatory Visit: Payer: Self-pay | Admitting: *Deleted

## 2012-01-17 ENCOUNTER — Other Ambulatory Visit: Payer: Self-pay | Admitting: Physical Medicine & Rehabilitation

## 2012-01-18 ENCOUNTER — Encounter: Payer: Medicaid Other | Attending: Physical Medicine & Rehabilitation | Admitting: Physical Medicine & Rehabilitation

## 2012-01-18 ENCOUNTER — Encounter: Payer: Self-pay | Admitting: Physical Medicine & Rehabilitation

## 2012-01-18 VITALS — BP 129/73 | HR 101 | Resp 14 | Ht 64.0 in | Wt 218.2 lb

## 2012-01-18 DIAGNOSIS — M7060 Trochanteric bursitis, unspecified hip: Secondary | ICD-10-CM

## 2012-01-18 DIAGNOSIS — M47817 Spondylosis without myelopathy or radiculopathy, lumbosacral region: Secondary | ICD-10-CM | POA: Insufficient documentation

## 2012-01-18 DIAGNOSIS — G43909 Migraine, unspecified, not intractable, without status migrainosus: Secondary | ICD-10-CM | POA: Insufficient documentation

## 2012-01-18 DIAGNOSIS — M76899 Other specified enthesopathies of unspecified lower limb, excluding foot: Secondary | ICD-10-CM | POA: Insufficient documentation

## 2012-01-18 DIAGNOSIS — IMO0001 Reserved for inherently not codable concepts without codable children: Secondary | ICD-10-CM | POA: Insufficient documentation

## 2012-01-18 DIAGNOSIS — M47816 Spondylosis without myelopathy or radiculopathy, lumbar region: Secondary | ICD-10-CM

## 2012-01-18 DIAGNOSIS — M7918 Myalgia, other site: Secondary | ICD-10-CM

## 2012-01-18 DIAGNOSIS — G2581 Restless legs syndrome: Secondary | ICD-10-CM | POA: Insufficient documentation

## 2012-01-18 MED ORDER — TOPIRAMATE 50 MG PO TABS: 50.0000 mg | ORAL_TABLET | Freq: Every day | ORAL | Status: DC

## 2012-01-18 MED ORDER — METHOCARBAMOL 750 MG PO TABS: 750.0000 mg | ORAL_TABLET | Freq: Four times a day (QID) | ORAL | Status: DC | PRN

## 2012-01-18 MED ORDER — OXYCODONE-ACETAMINOPHEN 5-325 MG PO TABS: 1.0000 | ORAL_TABLET | Freq: Four times a day (QID) | ORAL | Status: DC | PRN

## 2012-01-18 MED ORDER — BUTALBITAL-APAP-CAFFEINE 50-325-40 MG PO TABS: 1.0000 | ORAL_TABLET | Freq: Two times a day (BID) | ORAL | Status: DC | PRN

## 2012-01-18 NOTE — Patient Instructions (Signed)
Call me with questions 

## 2012-01-18 NOTE — Progress Notes (Signed)
Subjective:    Patient ID: Toy Care, female    DOB: 19-Apr-1970, 41 y.o.   MRN: [id number]  HPI  Bailley is back regarding her headaches and back pain. She feels that the topamax and frova were quite helpful for her headaches. She is only havint one or two per month and the severity of those is decreased. The frova helps for breakthrough symptoms. She also continues on the neurontin.  She uses the percocet for breakthrough spine pain, and generally takes 3 per day. She uses fioricet for breakthrough headaches.  She has a hard time getting to exercise due to her family obligations. She does try stretch a little bit every day or so. She is getting little aerobic exercise. Things appear to be better at home. Her husband has moved back to [place]  Pain Inventory Average Pain 4 Pain Right Now 7 My pain is burning, dull, stabbing and aching  In the last 24 hours, has pain interfered with the following? General activity 6 Relation with others 4 Enjoyment of life 4 What TIME of day is your pain at its worst? all of the time, esp during day and evening Sleep (in general) Fair  Pain is worse with: walking, bending, sitting, inactivity and standing Pain improves with: rest, heat/ice and medication Relief from Meds: 9  Mobility walk without assistance  Function Do you have any goals in this area?  no  Neuro/Psych weakness numbness anxiety  Prior Studies Any changes since last visit?  no  Physicians involved in your care Any changes since last visit?  no   Family History  Problem Relation Age of Onset  . Diabetes Mother   . Hypertension Mother    History   Social History  . Marital Status: Married    Spouse Name: N/A    Number of Children: N/A  . Years of Education: N/A   Social History Main Topics  . Smoking status: Never Smoker   . Smokeless tobacco: Never Used  . Alcohol Use: No  . Drug Use: No  . Sexually Active: Yes    Birth Control/ Protection: Surgical    Other Topics Concern  . None   Social History Narrative  . None   Past Surgical History  Procedure Date  . Dilation and curettage of uterus   . Tubal ligation   . Cesarean section    Past Medical History  Diagnosis Date  . Migraines   . GERD (gastroesophageal reflux disease)   . Gastroparesis   . Hypertension   . Kidney stone   . Sleep apnea   . Restless legs    BP 129/73  Pulse 101  Resp 14  Ht 5\' 4"  (1.626 m)  Wt 218 lb 3.2 oz (98.975 kg)  BMI 37.45 kg/m2  SpO2 96%   Review of Systems  Neurological: Positive for weakness.  Psychiatric/Behavioral: The patient is nervous/anxious.   All other systems reviewed and are negative.       Objective:   Physical Exam Constitutional: She is oriented to person, place, and time. She appears well-developed and well-nourished.  obese  HENT:  Head: Normocephalic and atraumatic.  Nose: Nose normal.  Mouth/Throat: Oropharynx is clear and moist.  Eyes: Conjunctivae and EOM are normal. Pupils are equal, round, and reactive to light.  Cardiovascular: Normal rate and regular rhythm.  Pulmonary/Chest: Effort normal and breath sounds normal.  Abdominal: Bowel sounds are normal. She exhibits no distension. There is no tenderness.  Musculoskeletal:  She continues to have  pain with palpation over the lower lumbar segments, especially on the right.  She was able to bend and easily touch her toes. Cervical bending and rotation to the right did cause some pain. No major postural abnormalities are noted. Neurological: She is alert and oriented to person, place, and time. She has normal strength and normal reflexes. No sensory deficit. She displays a negative Romberg sign.  Skin: Skin is warm.  Psychiatric: mood was much brighter and up beat today. Her behavior is normal. Judgment and thought content normal.    Assessment & Plan:   1. Chronic common migraine intractable which have improved a great deal -Continue neurontin 800mg  qid.   -Increase topamax to 50mg  qhs, may increase to 100mg  qhs if needed.  2. Lumbar spondylosis, low back pain  -- see #5 again.  -She still needs to focus on weight loss-topamax may help a bit here  -Percocet 5/325 was refilled one every 12 hours when necessary #90 today  3. Greater trochanter bursitis  -Recommended regular ice and stretching as above.   4. Obesity-reviewed exercise and diet again.  5. Myofascial pain.  -discussed regular stretching, posture, and exercise.  -continue Robaxin to 750 mg by mouth every 6 hours when necessary  6. I'll have her see my PA  back in about 3 months time.

## 2012-02-14 ENCOUNTER — Telehealth: Payer: Self-pay | Admitting: *Deleted

## 2012-02-14 DIAGNOSIS — M47816 Spondylosis without myelopathy or radiculopathy, lumbar region: Secondary | ICD-10-CM

## 2012-02-14 DIAGNOSIS — G43909 Migraine, unspecified, not intractable, without status migrainosus: Secondary | ICD-10-CM

## 2012-02-14 DIAGNOSIS — G2581 Restless legs syndrome: Secondary | ICD-10-CM

## 2012-02-14 DIAGNOSIS — M7918 Myalgia, other site: Secondary | ICD-10-CM

## 2012-02-14 DIAGNOSIS — M7060 Trochanteric bursitis, unspecified hip: Secondary | ICD-10-CM

## 2012-02-14 MED ORDER — OXYCODONE-ACETAMINOPHEN 5-325 MG PO TABS: 1.0000 | ORAL_TABLET | Freq: Four times a day (QID) | ORAL | Status: DC | PRN

## 2012-02-14 NOTE — Telephone Encounter (Signed)
Patient aware script is ready for pick up 

## 2012-02-14 NOTE — Telephone Encounter (Signed)
Patient needs new prescription for Percocet

## 2012-02-14 NOTE — Telephone Encounter (Signed)
Prescription printed for Dr. Swartz to sign 

## 2012-03-10 ENCOUNTER — Telehealth: Payer: Self-pay | Admitting: *Deleted

## 2012-03-10 DIAGNOSIS — M7918 Myalgia, other site: Secondary | ICD-10-CM

## 2012-03-10 DIAGNOSIS — M47816 Spondylosis without myelopathy or radiculopathy, lumbar region: Secondary | ICD-10-CM

## 2012-03-10 DIAGNOSIS — G2581 Restless legs syndrome: Secondary | ICD-10-CM

## 2012-03-10 DIAGNOSIS — G43909 Migraine, unspecified, not intractable, without status migrainosus: Secondary | ICD-10-CM

## 2012-03-10 DIAGNOSIS — M7060 Trochanteric bursitis, unspecified hip: Secondary | ICD-10-CM

## 2012-03-10 MED ORDER — OXYCODONE-ACETAMINOPHEN 5-325 MG PO TABS: 1.0000 | ORAL_TABLET | Freq: Four times a day (QID) | ORAL | Status: DC | PRN

## 2012-03-10 NOTE — Telephone Encounter (Signed)
Prescription printed for Dr. Swartz to sign 

## 2012-03-10 NOTE — Telephone Encounter (Signed)
Patient needs refill on Percocet. 

## 2012-03-13 NOTE — Telephone Encounter (Signed)
Notified Rx available for pick up

## 2012-03-26 ENCOUNTER — Emergency Department (HOSPITAL_BASED_OUTPATIENT_CLINIC_OR_DEPARTMENT_OTHER)
Admission: EM | Admit: 2012-03-26 | Discharge: 2012-03-26 | Disposition: A | Discharge status: Home / Self Care | Payer: Medicaid Other | Attending: Emergency Medicine | Admitting: Emergency Medicine

## 2012-03-26 ENCOUNTER — Encounter (HOSPITAL_BASED_OUTPATIENT_CLINIC_OR_DEPARTMENT_OTHER): Payer: Self-pay

## 2012-03-26 DIAGNOSIS — R630 Anorexia: Secondary | ICD-10-CM | POA: Insufficient documentation

## 2012-03-26 DIAGNOSIS — K219 Gastro-esophageal reflux disease without esophagitis: Secondary | ICD-10-CM | POA: Insufficient documentation

## 2012-03-26 DIAGNOSIS — R110 Nausea: Secondary | ICD-10-CM | POA: Insufficient documentation

## 2012-03-26 DIAGNOSIS — G43909 Migraine, unspecified, not intractable, without status migrainosus: Secondary | ICD-10-CM | POA: Insufficient documentation

## 2012-03-26 DIAGNOSIS — H53149 Visual discomfort, unspecified: Secondary | ICD-10-CM | POA: Insufficient documentation

## 2012-03-26 DIAGNOSIS — I10 Essential (primary) hypertension: Secondary | ICD-10-CM | POA: Insufficient documentation

## 2012-03-26 DIAGNOSIS — Z8669 Personal history of other diseases of the nervous system and sense organs: Secondary | ICD-10-CM | POA: Insufficient documentation

## 2012-03-26 DIAGNOSIS — Z79899 Other long term (current) drug therapy: Secondary | ICD-10-CM | POA: Insufficient documentation

## 2012-03-26 DIAGNOSIS — Z87442 Personal history of urinary calculi: Secondary | ICD-10-CM | POA: Insufficient documentation

## 2012-03-26 DIAGNOSIS — Z8719 Personal history of other diseases of the digestive system: Secondary | ICD-10-CM | POA: Insufficient documentation

## 2012-03-26 MED ORDER — HYDROMORPHONE HCL PF 1 MG/ML IJ SOLN
1.0000 mg | Freq: Once | INTRAMUSCULAR | Status: AC
  Administered 2012-03-26: 1 mg via INTRAVENOUS
  Filled 2012-03-26: qty 1

## 2012-03-26 MED ORDER — DEXAMETHASONE SODIUM PHOSPHATE 4 MG/ML IJ SOLN
4.0000 mg | Freq: Once | INTRAMUSCULAR | Status: AC
  Administered 2012-03-26: 4 mg via INTRAVENOUS
  Filled 2012-03-26: qty 1

## 2012-03-26 MED ORDER — SODIUM CHLORIDE 0.9 % IV BOLUS (SEPSIS)
1000.0000 mL | Freq: Once | INTRAVENOUS | Status: AC
  Administered 2012-03-26: 1000 mL via INTRAVENOUS

## 2012-03-26 MED ORDER — PROMETHAZINE HCL 25 MG/ML IJ SOLN
25.0000 mg | Freq: Once | INTRAMUSCULAR | Status: AC
  Administered 2012-03-26: 25 mg via INTRAVENOUS
  Filled 2012-03-26: qty 1

## 2012-03-26 MED ORDER — DIPHENHYDRAMINE HCL 50 MG/ML IJ SOLN
50.0000 mg | Freq: Once | INTRAMUSCULAR | Status: AC
  Administered 2012-03-26: 50 mg via INTRAVENOUS
  Filled 2012-03-26: qty 1

## 2012-03-26 MED ORDER — LORAZEPAM 2 MG/ML IJ SOLN
1.0000 mg | Freq: Once | INTRAMUSCULAR | Status: AC
  Administered 2012-03-26: 1 mg via INTRAVENOUS
  Filled 2012-03-26: qty 1

## 2012-03-26 MED ORDER — KETOROLAC TROMETHAMINE 30 MG/ML IJ SOLN
30.0000 mg | Freq: Once | INTRAMUSCULAR | Status: AC
  Administered 2012-03-26: 30 mg via INTRAVENOUS
  Filled 2012-03-26: qty 1

## 2012-03-26 NOTE — ED Notes (Addendum)
Pt. States pain level after pain meds is 7/10. Requested something to help her "nerves" per MD. Medicated per order.

## 2012-03-26 NOTE — ED Notes (Signed)
Pt states that she has had a migraine since Friday with severe pain in forehead and R side of the head, symptoms: nausea, pain, no appetite, photophobia/phonophobia.  Pt has taken extensive at home regimen of medication, no relief.

## 2012-03-26 NOTE — ED Notes (Signed)
MD with pt  

## 2012-03-26 NOTE — ED Provider Notes (Signed)
History  This chart was scribed for Richardean Canal, MD by Shari Heritage, ED Scribe. The patient was seen in room MH01/MH01. Patient's care was started at 1806.  CSN: [id number]  Arrival date & time 03/26/12  1659   First MD Initiated Contact with Patient 03/26/12 1806      Chief Complaint  Patient presents with  . Migraine    The history is provided by the patient. No language interpreter was used.     HPI Comments: Sandra [name] is a 42 y.o. female with history of migraine who presents to the Emergency Department complaining of worsening, constant, moderate to severe, non-radiating, frontal and right-sided headache onset Friday. There is associated mild photophobia, nausea and decreased appetite. Patient has taken Fiorcet, Benadryl, Phenergan and Tylenol at home with no relief. Patient is currently on Topomax and Neurontin and states that she hasn't had a migraine episode for several months. Patient states that she has been given Decadron, Imitrex and Toradol for pain here before for migraines, but they haven't been that effective in relieving pain. Patient's other medical history includes GERD and HTN.    Past Medical History  Diagnosis Date  . Migraines   . GERD (gastroesophageal reflux disease)   . Gastroparesis   . Hypertension   . Kidney stone   . Sleep apnea   . Restless legs     Past Surgical History  Procedure Laterality Date  . Dilation and curettage of uterus    . Tubal ligation    . Cesarean section      Family History  Problem Relation Age of Onset  . Diabetes Mother   . Hypertension Mother     History  Substance Use Topics  . Smoking status: Never Smoker   . Smokeless tobacco: Never Used  . Alcohol Use: No    OB History   Grav Para Term Preterm Abortions TAB SAB Ect Mult Living                  Review of Systems  Constitutional: Positive for appetite change.  Eyes: Positive for photophobia (mild). Negative for visual disturbance.   Gastrointestinal: Positive for nausea.  Neurological: Positive for headaches.  All other systems reviewed and are negative.    Allergies  Iodides; Imitrex; and Metoclopramide hcl  Home Medications   Current Outpatient Rx  Name  Route  Sig  Dispense  Refill  . albuterol (PROVENTIL HFA;VENTOLIN HFA) 108 (90 BASE) MCG/ACT inhaler   Inhalation   Inhale 2 puffs into the lungs every 2 (two) hours as needed for wheezing or shortness of breath (cough).   1 Inhaler   0   . ALPRAZolam (XANAX) 0.5 MG tablet   Oral   Take 0.5 mg by mouth daily as needed. For anxiety         . butalbital-acetaminophen-caffeine (FIORICET, ESGIC) 50-325-40 MG per tablet   Oral   Take 1 tablet by mouth 2 (two) times daily as needed for headache.   60 tablet   3   . dexlansoprazole (DEXILANT) 60 MG capsule   Oral   Take 60 mg by mouth 2 (two) times daily.         . diphenhydrAMINE (BENADRYL) 25 MG tablet   Oral   Take 50 mg by mouth every 4 (four) hours as needed. For runny nose and sneezing         . FROVA 2.5 MG tablet      TAKE 1 TAB AS NEEDED FOR  MIGRAINE MAY REPEAT AFTER 2 HOURS MAX OF 3 TABS IN 24 HOURS   10 tablet   2   . gabapentin (NEURONTIN) 800 MG tablet   Oral   Take 1 tablet (800 mg total) by mouth 4 (four) times daily.   120 tablet   3   . ibuprofen (ADVIL,MOTRIN) 200 MG tablet   Oral   Take 400 mg by mouth 2 (two) times daily as needed. For pain         . labetalol (NORMODYNE) 300 MG tablet   Oral   Take 600 mg by mouth 2 (two) times daily.          . methocarbamol (ROBAXIN) 750 MG tablet   Oral   Take 1 tablet (750 mg total) by mouth every 6 (six) hours as needed.   75 tablet   4   . oxyCODONE-acetaminophen (PERCOCET/ROXICET) 5-325 MG per tablet   Oral   Take 1 tablet by mouth every 6 (six) hours as needed.   90 tablet   0   . promethazine (PHENERGAN) 25 MG tablet   Oral   Take 25 mg by mouth every 6 (six) hours as needed. For nausea          .  topiramate (TOPAMAX) 50 MG tablet   Oral   Take 1-2 tablets (50-100 mg total) by mouth at bedtime.   60 tablet   4   . zolpidem (AMBIEN) 10 MG tablet   Oral   Take 10 mg by mouth at bedtime as needed. For sleep         . meloxicam (MOBIC) 15 MG tablet      TAKE 1 TABLET BY MOUTH EVERY MORNING WITH FOOD   30 tablet   3     Triage Vitals: BP 132/93  Pulse 96  Temp(Src) 98.9 F (37.2 C) (Oral)  Ht 5\' 4"  (1.626 m)  Wt 210 lb (95.255 kg)  BMI 36.03 kg/m2  SpO2 100%  LMP 03/05/2012  Physical Exam  Constitutional: She is oriented to person, place, and time. She appears well-developed and well-nourished. No distress.  HENT:  Head: Normocephalic and atraumatic.  Eyes: Conjunctivae and EOM are normal. Pupils are equal, round, and reactive to light.  Neck: Normal range of motion. Neck supple.  Cardiovascular: Normal rate, regular rhythm and normal heart sounds.   Pulmonary/Chest: Effort normal and breath sounds normal.  Musculoskeletal: Normal range of motion.  Neurological: She is alert and oriented to person, place, and time.  Moving all extremities without difficulty. No focal neurological deficits.  Skin: Skin is warm and dry. No rash noted.  Psychiatric: She has a normal mood and affect. Her behavior is normal.    ED Course  Procedures (including critical care time) DIAGNOSTIC STUDIES: Oxygen Saturation is 100% on room air, normal by my interpretation.    COORDINATION OF CARE: 6:37 PM- Patient informed of current plan for treatment and evaluation and agrees with plan at this time.  8:09PM- Patient says that her headache is not improved after IV fluids, Phenergan 25 mg, Benadryl 50 mg, Toradol 30 mg and Dilaudid 1 mg. Will order additional meds to get HA controlled.  8:15 PM- Ordered additional doses of Decadron 4 mg and Dilaudid 1 mg.   8:56 PM- Patient states that medicines provided have offered transient relief. Is now requesting medicines to "help her relax" so  she can go home and sleep. Will give 1 mg of Ativan.  10:15 PM Patient felt better, will  d/c home.   Labs Reviewed - No data to display No results found.   1. Migraines       MDM  Sandra [name] is a 42 y.o. female here with headache, similar to her migraines. No red flags for subarachnoid hemorrhage. Felt a little better after multiple rounds of narcotics, steroids, phenergan, toradol. Nonfocal neuro exam. Stable for d/c.    I personally performed the services described in this documentation, which was scribed in my presence. The recorded information has been reviewed and is accurate.   Richardean Canal, MD 03/26/12 2217

## 2012-04-17 ENCOUNTER — Encounter
Payer: Medicaid Other | Attending: Physical Medicine and Rehabilitation | Admitting: Physical Medicine and Rehabilitation

## 2012-04-17 ENCOUNTER — Other Ambulatory Visit: Payer: Self-pay | Admitting: Physical Medicine & Rehabilitation

## 2012-04-17 ENCOUNTER — Encounter: Payer: Self-pay | Admitting: Physical Medicine and Rehabilitation

## 2012-04-17 VITALS — BP 135/90 | HR 96 | Resp 14 | Ht 64.0 in | Wt 221.8 lb

## 2012-04-17 DIAGNOSIS — K219 Gastro-esophageal reflux disease without esophagitis: Secondary | ICD-10-CM | POA: Insufficient documentation

## 2012-04-17 DIAGNOSIS — M545 Low back pain, unspecified: Secondary | ICD-10-CM | POA: Insufficient documentation

## 2012-04-17 DIAGNOSIS — Z5181 Encounter for therapeutic drug level monitoring: Secondary | ICD-10-CM

## 2012-04-17 DIAGNOSIS — M47816 Spondylosis without myelopathy or radiculopathy, lumbar region: Secondary | ICD-10-CM

## 2012-04-17 DIAGNOSIS — E669 Obesity, unspecified: Secondary | ICD-10-CM | POA: Insufficient documentation

## 2012-04-17 DIAGNOSIS — I10 Essential (primary) hypertension: Secondary | ICD-10-CM | POA: Insufficient documentation

## 2012-04-17 DIAGNOSIS — M47817 Spondylosis without myelopathy or radiculopathy, lumbosacral region: Secondary | ICD-10-CM | POA: Insufficient documentation

## 2012-04-17 DIAGNOSIS — G43909 Migraine, unspecified, not intractable, without status migrainosus: Secondary | ICD-10-CM

## 2012-04-17 DIAGNOSIS — M76899 Other specified enthesopathies of unspecified lower limb, excluding foot: Secondary | ICD-10-CM

## 2012-04-17 DIAGNOSIS — M7918 Myalgia, other site: Secondary | ICD-10-CM

## 2012-04-17 DIAGNOSIS — G2581 Restless legs syndrome: Secondary | ICD-10-CM

## 2012-04-17 DIAGNOSIS — IMO0001 Reserved for inherently not codable concepts without codable children: Secondary | ICD-10-CM

## 2012-04-17 DIAGNOSIS — G4730 Sleep apnea, unspecified: Secondary | ICD-10-CM | POA: Insufficient documentation

## 2012-04-17 DIAGNOSIS — G43019 Migraine without aura, intractable, without status migrainosus: Secondary | ICD-10-CM | POA: Insufficient documentation

## 2012-04-17 MED ORDER — OXYCODONE-ACETAMINOPHEN 5-325 MG PO TABS: 1.0000 | ORAL_TABLET | Freq: Four times a day (QID) | ORAL | Status: DC | PRN

## 2012-04-17 NOTE — Patient Instructions (Signed)
Try to start a walking program,

## 2012-04-17 NOTE — Progress Notes (Signed)
Subjective:    Patient ID: Sandra [name], female    DOB: 08-22-1970, 42 y.o.   MRN: [id number]  HPI The patient complains about migraines and LBP . The patient complains about LBP, which radiates into her posterior hips bilateral, worse on the right.  The patient also complains about numbness in the right posterior and lateral hip.  The problem has been stable . Pain Inventory Average Pain 4 Pain Right Now 5 My pain is sharp, burning, tingling and aching  In the last 24 hours, has pain interfered with the following? General activity 6 Relation with others 5 Enjoyment of life 5 What TIME of day is your pain at its worst? 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  no  Physicians involved in your [name] Any changes since last visit?  no   Family History  Problem Relation Age of Onset  . Diabetes Mother   . Hypertension Mother    History   Social History  . Marital Status: Married    Spouse Name: N/A    Number of Children: N/A  . Years of Education: N/A   Social History Main Topics  . Smoking status: Never Smoker   . Smokeless tobacco: Never Used  . Alcohol Use: No  . Drug Use: No  . Sexually Active: Yes    Birth Control/ Protection: Surgical   Other Topics Concern  . None   Social History Narrative  . None   Past Surgical History  Procedure Laterality Date  . Dilation and curettage of uterus    . Tubal ligation    . Cesarean section     Past Medical History  Diagnosis Date  . Migraines   . GERD (gastroesophageal reflux disease)   . Gastroparesis   . Hypertension    . Kidney stone   . Sleep apnea   . Restless legs    BP 135/90  Pulse 96  Resp 14  Ht 5\' 4"  (1.626 m)  Wt 221 lb 12.8 oz (100.608 kg)  BMI 38.05 kg/m2  SpO2 97%  LMP 03/05/2012    Review of Systems  Constitutional: Positive for diaphoresis and unexpected weight change.  Genitourinary:       Bladder control problems  Musculoskeletal:       Spasms  Neurological: Positive for weakness.       Tingling  Psychiatric/Behavioral: Positive for confusion. The patient is nervous/anxious.   All other systems reviewed and are negative.       Objective:   Physical Exam  Constitutional: She is oriented to person, place, and time. She appears well-developed and well-nourished.  obese  HENT:  Head: Normocephalic.  Musculoskeletal: She exhibits tenderness.  Neurological: She is alert and oriented to person, place, and time.  Skin: Skin is warm and dry.  Psychiatric: She has a normal mood and affect.    Symmetric normal motor tone is noted throughout. Normal muscle bulk. Muscle testing reveals 5/5 muscle strength of the upper extremity, and 5/5 of the lower extremity. Full range of motion in upper and lower extremities. ROM of spine is not restricted. Fine motor movements are normal in both hands. Sensory is intact  and symmetric to light touch, pinprick and proprioception. DTR in the upper and lower extremity are present and symmetric 2+. No clonus is noted.  Patient arises from chair without difficulty. Narrow based gait with normal arm swing bilateral , able to walk on heels and toes . Tandem walk is stable. No pronator drift. Rhomberg negative.        Assessment & Plan:  1. Chronic common migraine intractable which have improved a great deal  -Continue neurontin 800mg  qid.  -Increase topamax to 50mg  qhs, may increase to 100mg  qhs if needed.  2. Lumbar spondylosis, low back pain , last X rays of L-spine did not show significant findings -- see #5 again.  -She still needs to  focus on weight loss-topamax may help a bit here  -Percocet 5/325 was refilled one every 12 hours when necessary #90 today  3. Greater trochanter bursitis  -Recommended regular ice and stretching as above.  4. Obesity-reviewed exercise and diet again.  5. Myofascial pain.  -discussed regular stretching, posture, and exercise.Patient does not like to exercise, recommended to start a walking program.  -continue Robaxin to 750 mg by mouth every 6 hours when necessary  6.  back in about 3 months time.

## 2012-04-27 ENCOUNTER — Telehealth: Payer: Self-pay

## 2012-04-27 NOTE — Telephone Encounter (Signed)
Message copied by Judd Gaudier on Thu Apr 27, 2012 11:39 AM ------      Message from: Su Monks      Created: Thu Apr 27, 2012  9:41 AM       Please educate about alcohol with narcotics, she stated that she had taken the last oxy 2 days at least before the UDS, she does not take a high dose, it could be that it was not detectable anymore. But we will do another UDS in about 2 month, and if there are any inconsistencies she will be non narcotic. ------

## 2012-04-27 NOTE — Telephone Encounter (Signed)
Tried to contact patient to update her on her inconsistent urine drug screen and educate her on alcohol consumption and narcotics.

## 2012-05-01 ENCOUNTER — Other Ambulatory Visit: Payer: Self-pay | Admitting: Physical Medicine & Rehabilitation

## 2012-05-15 ENCOUNTER — Telehealth: Payer: Self-pay

## 2012-05-15 DIAGNOSIS — G43909 Migraine, unspecified, not intractable, without status migrainosus: Secondary | ICD-10-CM

## 2012-05-15 DIAGNOSIS — G2581 Restless legs syndrome: Secondary | ICD-10-CM

## 2012-05-15 DIAGNOSIS — M47816 Spondylosis without myelopathy or radiculopathy, lumbar region: Secondary | ICD-10-CM

## 2012-05-15 DIAGNOSIS — M7918 Myalgia, other site: Secondary | ICD-10-CM

## 2012-05-15 MED ORDER — OXYCODONE-ACETAMINOPHEN 5-325 MG PO TABS: 1.0000 | ORAL_TABLET | Freq: Four times a day (QID) | ORAL | Status: DC | PRN

## 2012-05-15 NOTE — Telephone Encounter (Signed)
Last filled 04/17/12. Rx printed for Clydie Braun to sign.

## 2012-05-15 NOTE — Telephone Encounter (Signed)
Patient called to get percocet refilled.  Please advise.

## 2012-05-15 NOTE — Telephone Encounter (Signed)
Needs to be seen in May, not the scheduled June appt per Clydie Braun.

## 2012-05-16 ENCOUNTER — Other Ambulatory Visit: Payer: Self-pay | Admitting: Physical Medicine & Rehabilitation

## 2012-05-17 ENCOUNTER — Telehealth: Payer: Self-pay | Admitting: *Deleted

## 2012-05-17 NOTE — Telephone Encounter (Signed)
FYI When reviewing Collette's record after an electronic request for refills came from her pharmacy, she went to the ED 03/26/12  and received Dilaudid and Lorazepam IV as well as some other non narcotic medications for her migraine even though her chart is flagged for medication contract with our office, which is a violation of the contract.  She did not report this to [place] at her 04/17/12 visit with the PA. We collected a UDS at that 3/3/visit which was positive for alcohol, and negative for oxy or any metabolite. (last dose 04/15/12)

## 2012-05-17 NOTE — Telephone Encounter (Signed)
This information means discharge from this practice. No further medication refills will be provided

## 2012-05-22 NOTE — Telephone Encounter (Signed)
A discharge letter is being sent and will discuss if she returns our call.  She did not return call about UDS report.

## 2012-05-22 NOTE — Telephone Encounter (Signed)
Attempted to reach Ms Eisen to discuss UDS and recent ER visit with narcotics. No answer. Message left to return my call.  Dr Riley Kill has decided to discharge from practice due to ETOH in UDS, and receiving narcotic from ER.  Will notify when call returned.

## 2012-05-23 ENCOUNTER — Other Ambulatory Visit: Payer: Self-pay | Admitting: Physical Medicine & Rehabilitation

## 2012-06-12 ENCOUNTER — Telehealth: Payer: Self-pay

## 2012-06-12 NOTE — Telephone Encounter (Signed)
Patient called for percocet refill.

## 2012-06-13 ENCOUNTER — Telehealth (HOSPITAL_COMMUNITY): Payer: Self-pay | Admitting: Emergency Medicine

## 2012-06-13 NOTE — Telephone Encounter (Signed)
i'm sorry, no more meds. She is discharged

## 2012-06-13 NOTE — ED Notes (Signed)
Call from pt stating her MD told her she was seen at hospital Mar 26, 2012 and she is stating she was not a pt at that time.  ID verified w/DOB and last 4 of SS#. Informed her EPIC is showing visit on Feb 9th 2014 @ [facility].  Transferred call to [employer] for further assistance.

## 2012-06-13 NOTE — Telephone Encounter (Signed)
Patient informed again she has been discharged.

## 2012-06-13 NOTE — Telephone Encounter (Addendum)
Sandra [name] called requesting a refill on her percocet.. She denies getting the discharge letter even though she verified her address is the same as the one in the computer. She  denies going to the ED even though there is documentation in record. Says she only goes to [facility]. She has a positive ETOH in urine and says that she does not even drink.  I explained about the ED record she still denies going to ED and getting treated with narcotics. The uds was checked for quantitative analysis and it was in fact a positive ETOH.  I told her the letter was mailed 05/22/12 and we had been trying to reach her to discuss the UDS before mailing the letter but she would not return our calls. She did not deny receiving these calls. When reviewing ED record by viewing the Rm #, the ED treatment was at the [facility] in [facility]. Please advise.

## 2012-06-13 NOTE — Telephone Encounter (Signed)
Left message for Sandra [name] to give [place] a call about her request for Percocet.  A discharge letter was mailed 05/22/12 certified mail related to her UDS and receiving narcotics from ER.

## 2012-06-14 ENCOUNTER — Telehealth: Payer: Self-pay

## 2012-06-14 NOTE — Telephone Encounter (Signed)
Second discharge letter mailed to patient.

## 2012-07-07 ENCOUNTER — Encounter: Payer: Medicaid Other | Admitting: Physical Medicine and Rehabilitation

## 2012-07-18 ENCOUNTER — Ambulatory Visit: Payer: Medicaid Other | Admitting: Physical Medicine and Rehabilitation

## 2012-08-02 ENCOUNTER — Encounter (HOSPITAL_BASED_OUTPATIENT_CLINIC_OR_DEPARTMENT_OTHER): Payer: Self-pay

## 2012-08-02 ENCOUNTER — Emergency Department (HOSPITAL_BASED_OUTPATIENT_CLINIC_OR_DEPARTMENT_OTHER)
Admission: EM | Admit: 2012-08-02 | Discharge: 2012-08-02 | Disposition: A | Discharge status: Home / Self Care | Payer: Medicaid Other | Attending: Emergency Medicine | Admitting: Emergency Medicine

## 2012-08-02 DIAGNOSIS — Z8719 Personal history of other diseases of the digestive system: Secondary | ICD-10-CM | POA: Insufficient documentation

## 2012-08-02 DIAGNOSIS — I10 Essential (primary) hypertension: Secondary | ICD-10-CM | POA: Insufficient documentation

## 2012-08-02 DIAGNOSIS — Z79899 Other long term (current) drug therapy: Secondary | ICD-10-CM | POA: Insufficient documentation

## 2012-08-02 DIAGNOSIS — G4730 Sleep apnea, unspecified: Secondary | ICD-10-CM | POA: Insufficient documentation

## 2012-08-02 DIAGNOSIS — G43909 Migraine, unspecified, not intractable, without status migrainosus: Secondary | ICD-10-CM | POA: Insufficient documentation

## 2012-08-02 DIAGNOSIS — Z87442 Personal history of urinary calculi: Secondary | ICD-10-CM | POA: Insufficient documentation

## 2012-08-02 DIAGNOSIS — G2581 Restless legs syndrome: Secondary | ICD-10-CM | POA: Insufficient documentation

## 2012-08-02 DIAGNOSIS — K219 Gastro-esophageal reflux disease without esophagitis: Secondary | ICD-10-CM | POA: Insufficient documentation

## 2012-08-02 MED ORDER — KETOROLAC TROMETHAMINE 30 MG/ML IJ SOLN
30.0000 mg | Freq: Once | INTRAMUSCULAR | Status: AC
  Administered 2012-08-02: 30 mg via INTRAVENOUS
  Filled 2012-08-02: qty 1

## 2012-08-02 MED ORDER — PROMETHAZINE HCL 25 MG/ML IJ SOLN
INTRAMUSCULAR | Status: AC
  Filled 2012-08-02: qty 1

## 2012-08-02 MED ORDER — MORPHINE SULFATE 4 MG/ML IJ SOLN
4.0000 mg | Freq: Once | INTRAMUSCULAR | Status: AC
  Administered 2012-08-02: 4 mg via INTRAVENOUS
  Filled 2012-08-02: qty 1

## 2012-08-02 MED ORDER — ONDANSETRON HCL 4 MG/2ML IJ SOLN
4.0000 mg | Freq: Once | INTRAMUSCULAR | Status: AC
  Administered 2012-08-02: 4 mg via INTRAVENOUS
  Filled 2012-08-02: qty 2

## 2012-08-02 MED ORDER — PROMETHAZINE HCL 25 MG/ML IJ SOLN
12.5000 mg | Freq: Once | INTRAMUSCULAR | Status: AC
  Administered 2012-08-02: 12.5 mg via INTRAVENOUS

## 2012-08-02 MED ORDER — SODIUM CHLORIDE 0.9 % IV BOLUS (SEPSIS)
1000.0000 mL | Freq: Once | INTRAVENOUS | Status: AC
  Administered 2012-08-02: 1000 mL via INTRAVENOUS

## 2012-08-02 MED ORDER — DEXAMETHASONE SODIUM PHOSPHATE 10 MG/ML IJ SOLN
10.0000 mg | Freq: Once | INTRAMUSCULAR | Status: AC
  Administered 2012-08-02: 10 mg via INTRAVENOUS
  Filled 2012-08-02: qty 1

## 2012-08-02 NOTE — ED Notes (Signed)
C/o migraine HA x 3 days with nausea-no vomiting-pt admits that she was d/c from [facility] "they said i had alcohol in my urine"-states her PCP Dr Wynelle Link at [place] manages her migraines now

## 2012-08-02 NOTE — ED Provider Notes (Signed)
History     CSN: [id number]  Arrival date & time 08/02/12  1202   First MD Initiated Contact with Patient 08/02/12 1236      Chief Complaint  Patient presents with  . Migraine    (Consider location/radiation/quality/duration/timing/severity/associated sxs/prior treatment) HPI Comments: Migraine for three days.  No relief with home medications.  Patient is a 42 y.o. female presenting with migraines. The history is provided by the patient.  Migraine This is a new problem. Episode onset: three days. The problem occurs constantly. The problem has been gradually worsening. Associated symptoms include headaches. The symptoms are aggravated by stress (lights). Nothing relieves the symptoms. She has tried nothing for the symptoms. The treatment provided no relief.    Past Medical History  Diagnosis Date  . Migraines   . GERD (gastroesophageal reflux disease)   . Gastroparesis   . Hypertension   . Kidney stone   . Sleep apnea   . Restless legs     Past Surgical History  Procedure Laterality Date  . Dilation and curettage of uterus    . Tubal ligation    . Cesarean section      Family History  Problem Relation Age of Onset  . Diabetes Mother   . Hypertension Mother     History  Substance Use Topics  . Smoking status: Never Smoker   . Smokeless tobacco: Never Used  . Alcohol Use: No    OB History   Grav Para Term Preterm Abortions TAB SAB Ect Mult Living                  Review of Systems  Neurological: Positive for headaches.  All other systems reviewed and are negative.    Allergies  Iodides; Imitrex; and Metoclopramide hcl  Home Medications   Current Outpatient Rx  Name  Route  Sig  Dispense  Refill  . albuterol (PROVENTIL HFA;VENTOLIN HFA) 108 (90 BASE) MCG/ACT inhaler   Inhalation   Inhale 2 puffs into the lungs every 2 (two) hours as needed for wheezing or shortness of breath (cough).   1 Inhaler   0   . ALPRAZolam (XANAX) 0.5 MG tablet    Oral   Take 0.5 mg by mouth daily as needed. For anxiety         . atorvastatin (LIPITOR) 10 MG tablet   Oral   Take 10 mg by mouth daily.         . butalbital-acetaminophen-caffeine (FIORICET, ESGIC) 50-325-40 MG per tablet      TAKE 1 TABLET TWICE A DAY AS NEEDED FOR HEADACHE   60 tablet   3   . dexlansoprazole (DEXILANT) 60 MG capsule   Oral   Take 60 mg by mouth 2 (two) times daily.         . diphenhydrAMINE (BENADRYL) 25 MG tablet   Oral   Take 50 mg by mouth every 4 (four) hours as needed. For runny nose and sneezing         . FROVA 2.5 MG tablet      TAKE 1 TAB AS NEEDED FOR MIGRAINE MAY REPEAT AFTER 2 HOURS MAX OF 3 TABS IN 24 HOURS   10 tablet   2   . gabapentin (NEURONTIN) 800 MG tablet   Oral   Take 1 tablet (800 mg total) by mouth 4 (four) times daily.   120 tablet   3   . ibuprofen (ADVIL,MOTRIN) 200 MG tablet   Oral   Take 400 mg  by mouth 2 (two) times daily as needed. For pain         . labetalol (NORMODYNE) 300 MG tablet   Oral   Take 600 mg by mouth 2 (two) times daily.          . meloxicam (MOBIC) 15 MG tablet      TAKE 1 TABLET BY MOUTH EVERY MORNING WITH FOOD   30 tablet   0   . methocarbamol (ROBAXIN) 750 MG tablet      TAKE 1 TABLET EVERY 6 HOURS AS NEEDED   75 tablet   4   . oxyCODONE-acetaminophen (PERCOCET/ROXICET) 5-325 MG per tablet   Oral   Take 1 tablet by mouth every 6 (six) hours as needed.   90 tablet   0   . promethazine (PHENERGAN) 25 MG tablet   Oral   Take 25 mg by mouth every 6 (six) hours as needed. For nausea          . SUMAtriptan (IMITREX) 6 MG/0.5ML SOLN injection   Subcutaneous   Inject 6 mg into the skin every 2 (two) hours as needed for migraine or headache. F         . topiramate (TOPAMAX) 50 MG tablet   Oral   Take 1-2 tablets (50-100 mg total) by mouth at bedtime.   60 tablet   4   . zolpidem (AMBIEN) 10 MG tablet   Oral   Take 10 mg by mouth at bedtime as needed. For sleep            BP 138/87  Pulse 78  Temp(Src) 97.8 F (36.6 C) (Core (Comment))  Ht 5\' 4"  (1.626 m)  Wt 210 lb (95.255 kg)  BMI 36.03 kg/m2  SpO2 97%  LMP 07/02/2012  Physical Exam  Nursing note and vitals reviewed. Constitutional: She is oriented to person, place, and time. She appears well-developed and well-nourished. No distress.  HENT:  Head: Normocephalic and atraumatic.  Eyes: EOM are normal. Pupils are equal, round, and reactive to light.  No papilledema on fundoscopic exam.  Neck: Normal range of motion. Neck supple.  Cardiovascular: Normal rate and regular rhythm.  Exam reveals no gallop and no friction rub.   No murmur heard. Pulmonary/Chest: Effort normal and breath sounds normal. No respiratory distress. She has no wheezes.  Abdominal: Soft. Bowel sounds are normal. She exhibits no distension. There is no tenderness.  Musculoskeletal: Normal range of motion.  Neurological: She is alert and oriented to person, place, and time. No cranial nerve deficit. She exhibits normal muscle tone. Coordination normal.  Skin: Skin is warm and dry. She is not diaphoretic.    ED Course  Procedures (including critical care time)  Labs Reviewed - No data to display No results found.   No diagnosis found.    MDM  Feels better with fluids, meds.  Will discharge to home.  Return prn.        Geoffery Lyons, MD 08/02/12 1414

## 2012-09-01 ENCOUNTER — Emergency Department (HOSPITAL_BASED_OUTPATIENT_CLINIC_OR_DEPARTMENT_OTHER)
Admission: EM | Admit: 2012-09-01 | Discharge: 2012-09-01 | Disposition: A | Discharge status: Home / Self Care | Payer: Medicaid Other | Attending: Emergency Medicine | Admitting: Emergency Medicine

## 2012-09-01 ENCOUNTER — Encounter (HOSPITAL_BASED_OUTPATIENT_CLINIC_OR_DEPARTMENT_OTHER): Payer: Self-pay | Admitting: *Deleted

## 2012-09-01 DIAGNOSIS — Z791 Long term (current) use of non-steroidal anti-inflammatories (NSAID): Secondary | ICD-10-CM | POA: Insufficient documentation

## 2012-09-01 DIAGNOSIS — Z87442 Personal history of urinary calculi: Secondary | ICD-10-CM | POA: Insufficient documentation

## 2012-09-01 DIAGNOSIS — Z8719 Personal history of other diseases of the digestive system: Secondary | ICD-10-CM | POA: Insufficient documentation

## 2012-09-01 DIAGNOSIS — I10 Essential (primary) hypertension: Secondary | ICD-10-CM | POA: Insufficient documentation

## 2012-09-01 DIAGNOSIS — Z8669 Personal history of other diseases of the nervous system and sense organs: Secondary | ICD-10-CM | POA: Insufficient documentation

## 2012-09-01 DIAGNOSIS — G2581 Restless legs syndrome: Secondary | ICD-10-CM | POA: Insufficient documentation

## 2012-09-01 DIAGNOSIS — Z79899 Other long term (current) drug therapy: Secondary | ICD-10-CM | POA: Insufficient documentation

## 2012-09-01 DIAGNOSIS — K219 Gastro-esophageal reflux disease without esophagitis: Secondary | ICD-10-CM | POA: Insufficient documentation

## 2012-09-01 DIAGNOSIS — G43909 Migraine, unspecified, not intractable, without status migrainosus: Secondary | ICD-10-CM

## 2012-09-01 MED ORDER — HYDROMORPHONE HCL PF 1 MG/ML IJ SOLN
2.0000 mg | Freq: Once | INTRAMUSCULAR | Status: AC
  Administered 2012-09-01: 2 mg via INTRAMUSCULAR
  Filled 2012-09-01: qty 2

## 2012-09-01 MED ORDER — PROMETHAZINE HCL 25 MG/ML IJ SOLN
25.0000 mg | Freq: Once | INTRAMUSCULAR | Status: AC
  Administered 2012-09-01: 25 mg via INTRAMUSCULAR
  Filled 2012-09-01: qty 1

## 2012-09-01 MED ORDER — ONDANSETRON HCL 4 MG/2ML IJ SOLN
4.0000 mg | Freq: Once | INTRAMUSCULAR | Status: DC
  Filled 2012-09-01: qty 2

## 2012-09-01 MED ORDER — KETOROLAC TROMETHAMINE 60 MG/2ML IM SOLN
60.0000 mg | Freq: Once | INTRAMUSCULAR | Status: AC
  Administered 2012-09-01: 60 mg via INTRAMUSCULAR
  Filled 2012-09-01: qty 2

## 2012-09-01 NOTE — ED Provider Notes (Signed)
Medical screening examination/treatment/procedure(s) were performed by non-physician practitioner and as supervising physician I was immediately available for consultation/collaboration.   Gwyneth Sprout, MD 09/01/12 [phone]

## 2012-09-01 NOTE — ED Notes (Signed)
Migraine headache x 3 days

## 2012-09-01 NOTE — ED Provider Notes (Signed)
History    CSN: [id number] Arrival date & time 09/01/12  2013  First MD Initiated Contact with Patient 09/01/12 2055     Chief Complaint  Patient presents with  . Migraine   (Consider location/radiation/quality/duration/timing/severity/associated sxs/prior Treatment) Patient is a 42 y.o. female presenting with migraines. The history is provided by the patient. No language interpreter was used.  Migraine This is a new problem. Episode onset: 3 days. The problem occurs constantly. The problem has been gradually worsening. Associated symptoms include headaches. Pertinent negatives include no abdominal pain, fever or vomiting. Nothing aggravates the symptoms.   Past Medical History  Diagnosis Date  . Migraines   . GERD (gastroesophageal reflux disease)   . Gastroparesis   . Hypertension   . Kidney stone   . Sleep apnea   . Restless legs    Past Surgical History  Procedure Laterality Date  . Dilation and curettage of uterus    . Tubal ligation    . Cesarean section     Family History  Problem Relation Age of Onset  . Diabetes Mother   . Hypertension Mother    History  Substance Use Topics  . Smoking status: Never Smoker   . Smokeless tobacco: Never Used  . Alcohol Use: No   OB History   Grav Para Term Preterm Abortions TAB SAB Ect Mult Living                 Review of Systems  Constitutional: Negative for fever.  Gastrointestinal: Negative for vomiting and abdominal pain.  Neurological: Positive for headaches. Negative for dizziness and light-headedness.  All other systems reviewed and are negative.    Allergies  Iodides; Imitrex; and Metoclopramide hcl  Home Medications   Current Outpatient Rx  Name  Route  Sig  Dispense  Refill  . albuterol (PROVENTIL HFA;VENTOLIN HFA) 108 (90 BASE) MCG/ACT inhaler   Inhalation   Inhale 2 puffs into the lungs every 2 (two) hours as needed for wheezing or shortness of breath (cough).   1 Inhaler   0   . ALPRAZolam  (XANAX) 0.5 MG tablet   Oral   Take 0.5 mg by mouth daily as needed. For anxiety         . atorvastatin (LIPITOR) 10 MG tablet   Oral   Take 10 mg by mouth daily.         . butalbital-acetaminophen-caffeine (FIORICET, ESGIC) 50-325-40 MG per tablet      TAKE 1 TABLET TWICE A DAY AS NEEDED FOR HEADACHE   60 tablet   3   . dexlansoprazole (DEXILANT) 60 MG capsule   Oral   Take 60 mg by mouth 2 (two) times daily.         . diphenhydrAMINE (BENADRYL) 25 MG tablet   Oral   Take 50 mg by mouth every 4 (four) hours as needed. For runny nose and sneezing         . FROVA 2.5 MG tablet      TAKE 1 TAB AS NEEDED FOR MIGRAINE MAY REPEAT AFTER 2 HOURS MAX OF 3 TABS IN 24 HOURS   10 tablet   2   . gabapentin (NEURONTIN) 800 MG tablet   Oral   Take 1 tablet (800 mg total) by mouth 4 (four) times daily.   120 tablet   3   . ibuprofen (ADVIL,MOTRIN) 200 MG tablet   Oral   Take 400 mg by mouth 2 (two) times daily as needed. For pain         .  labetalol (NORMODYNE) 300 MG tablet   Oral   Take 600 mg by mouth 2 (two) times daily.          . meloxicam (MOBIC) 15 MG tablet      TAKE 1 TABLET BY MOUTH EVERY MORNING WITH FOOD   30 tablet   0   . methocarbamol (ROBAXIN) 750 MG tablet      TAKE 1 TABLET EVERY 6 HOURS AS NEEDED   75 tablet   4   . oxyCODONE-acetaminophen (PERCOCET/ROXICET) 5-325 MG per tablet   Oral   Take 1 tablet by mouth every 6 (six) hours as needed.   90 tablet   0   . promethazine (PHENERGAN) 25 MG tablet   Oral   Take 25 mg by mouth every 6 (six) hours as needed. For nausea          . SUMAtriptan (IMITREX) 6 MG/0.5ML SOLN injection   Subcutaneous   Inject 6 mg into the skin every 2 (two) hours as needed for migraine or headache. F         . topiramate (TOPAMAX) 50 MG tablet   Oral   Take 1-2 tablets (50-100 mg total) by mouth at bedtime.   60 tablet   4   . zolpidem (AMBIEN) 10 MG tablet   Oral   Take 10 mg by mouth at bedtime  as needed. For sleep          BP 141/88  Pulse 87  Temp(Src) 98.3 F (36.8 C) (Oral)  Resp 20  Ht 5\' 4"  (1.626 m)  Wt 214 lb (97.07 kg)  BMI 36.72 kg/m2  SpO2 100%  LMP 07/02/2012 Physical Exam  Nursing note and vitals reviewed. Constitutional: She is oriented to person, place, and time. She appears well-developed and well-nourished.  HENT:  Head: Normocephalic.  Right Ear: External ear normal.  Nose: Nose normal.  Mouth/Throat: Oropharynx is clear and moist.  Eyes: Conjunctivae are normal. Pupils are equal, round, and reactive to light.  Neck: Normal range of motion. Neck supple.  Cardiovascular: Normal rate and normal heart sounds.   Pulmonary/Chest: Effort normal and breath sounds normal.  Abdominal: Soft.  Musculoskeletal: Normal range of motion.  Neurological: She is alert and oriented to person, place, and time. She has normal reflexes.  Skin: Skin is warm.  Psychiatric: She has a normal mood and affect.    ED Course  Procedures (including critical care time) Labs Reviewed - No data to display No results found. 1. Migraine     MDM  Pt given torodol, dilaudid and phenergan.   Pt advised to follow up with her MD for recheck  Elson Areas, PA-C 09/01/12 2150

## 2012-09-12 ENCOUNTER — Other Ambulatory Visit: Payer: Self-pay | Admitting: Physical Medicine & Rehabilitation

## 2012-10-03 ENCOUNTER — Encounter (HOSPITAL_BASED_OUTPATIENT_CLINIC_OR_DEPARTMENT_OTHER): Payer: Self-pay | Admitting: *Deleted

## 2012-10-03 ENCOUNTER — Observation Stay (HOSPITAL_BASED_OUTPATIENT_CLINIC_OR_DEPARTMENT_OTHER)
Admission: EM | Admit: 2012-10-03 | Discharge: 2012-10-04 | Disposition: A | Discharge status: Home / Self Care | Payer: Medicaid Other | Attending: Cardiology | Admitting: Cardiology

## 2012-10-03 ENCOUNTER — Emergency Department (HOSPITAL_BASED_OUTPATIENT_CLINIC_OR_DEPARTMENT_OTHER): Payer: Medicaid Other

## 2012-10-03 DIAGNOSIS — R0602 Shortness of breath: Secondary | ICD-10-CM | POA: Insufficient documentation

## 2012-10-03 DIAGNOSIS — E785 Hyperlipidemia, unspecified: Secondary | ICD-10-CM | POA: Insufficient documentation

## 2012-10-03 DIAGNOSIS — G4733 Obstructive sleep apnea (adult) (pediatric): Secondary | ICD-10-CM | POA: Insufficient documentation

## 2012-10-03 DIAGNOSIS — D649 Anemia, unspecified: Secondary | ICD-10-CM | POA: Insufficient documentation

## 2012-10-03 DIAGNOSIS — I10 Essential (primary) hypertension: Secondary | ICD-10-CM | POA: Insufficient documentation

## 2012-10-03 DIAGNOSIS — I4891 Unspecified atrial fibrillation: Principal | ICD-10-CM | POA: Insufficient documentation

## 2012-10-03 DIAGNOSIS — R7309 Other abnormal glucose: Secondary | ICD-10-CM | POA: Insufficient documentation

## 2012-10-03 DIAGNOSIS — Z79899 Other long term (current) drug therapy: Secondary | ICD-10-CM | POA: Insufficient documentation

## 2012-10-03 HISTORY — DX: Other chronic pain: G89.29

## 2012-10-03 HISTORY — DX: Gestational diabetes mellitus in pregnancy, unspecified control: O24.419

## 2012-10-03 HISTORY — DX: Anemia, unspecified: D64.9

## 2012-10-03 HISTORY — DX: Low back pain, unspecified: M54.50

## 2012-10-03 HISTORY — DX: Personal history of other medical treatment: Z92.89

## 2012-10-03 HISTORY — DX: Low back pain: M54.5

## 2012-10-03 HISTORY — DX: Unspecified atrial fibrillation: I48.91

## 2012-10-03 HISTORY — DX: Pure hypercholesterolemia, unspecified: E78.00

## 2012-10-03 HISTORY — DX: Anxiety disorder, unspecified: F41.9

## 2012-10-03 LAB — COMPREHENSIVE METABOLIC PANEL
AST: 15 U/L (ref 0–37)
Albumin: 3.5 g/dL (ref 3.5–5.2)
Calcium: 9.8 mg/dL (ref 8.4–10.5)
Creatinine, Ser: 0.8 mg/dL (ref 0.50–1.10)

## 2012-10-03 LAB — CBC WITH DIFFERENTIAL/PLATELET
Basophils Absolute: 0 10*3/uL (ref 0.0–0.1)
Basophils Relative: 0 % (ref 0–1)
Eosinophils Relative: 1 % (ref 0–5)
HCT: 35.8 % — ABNORMAL LOW (ref 36.0–46.0)
MCHC: 31.3 g/dL (ref 30.0–36.0)
MCV: 85.4 fL (ref 78.0–100.0)
Monocytes Absolute: 0.7 10*3/uL (ref 0.1–1.0)
RDW: 14.9 % (ref 11.5–15.5)

## 2012-10-03 LAB — TROPONIN I: Troponin I: <0.3 ng/mL (ref <0.30)

## 2012-10-03 MED ORDER — PANTOPRAZOLE SODIUM 40 MG PO TBEC
40.0000 mg | DELAYED_RELEASE_TABLET | Freq: Every day | ORAL | Status: DC
  Administered 2012-10-04: 40 mg via ORAL

## 2012-10-03 MED ORDER — SODIUM CHLORIDE 0.9 % IJ SOLN: 3.0000 mL | Freq: Two times a day (BID) | INTRAMUSCULAR | Status: DC

## 2012-10-03 MED ORDER — ATORVASTATIN CALCIUM 10 MG PO TABS
10.0000 mg | ORAL_TABLET | Freq: Every day | ORAL | Status: DC
  Administered 2012-10-03: 10 mg via ORAL
  Filled 2012-10-03 (×2): qty 1

## 2012-10-03 MED ORDER — GABAPENTIN 800 MG PO TABS: 800.0000 mg | ORAL_TABLET | Freq: Four times a day (QID) | ORAL | Status: DC

## 2012-10-03 MED ORDER — ONDANSETRON HCL 4 MG/2ML IJ SOLN
4.0000 mg | Freq: Once | INTRAMUSCULAR | Status: AC
  Administered 2012-10-03: 4 mg via INTRAVENOUS

## 2012-10-03 MED ORDER — GABAPENTIN 400 MG PO CAPS
800.0000 mg | ORAL_CAPSULE | Freq: Three times a day (TID) | ORAL | Status: DC
  Administered 2012-10-03 – 2012-10-04 (×2): 800 mg via ORAL
  Filled 2012-10-03 (×6): qty 2

## 2012-10-03 MED ORDER — SODIUM CHLORIDE 0.9 % IV BOLUS (SEPSIS)
1000.0000 mL | Freq: Once | INTRAVENOUS | Status: AC
  Administered 2012-10-03 (×2): 1000 mL via INTRAVENOUS

## 2012-10-03 MED ORDER — ZOLPIDEM TARTRATE 5 MG PO TABS: 10.0000 mg | ORAL_TABLET | Freq: Every evening | ORAL | Status: DC | PRN

## 2012-10-03 MED ORDER — ONDANSETRON HCL 4 MG/2ML IJ SOLN: 4.0000 mg | Freq: Four times a day (QID) | INTRAMUSCULAR | Status: DC | PRN

## 2012-10-03 MED ORDER — DILTIAZEM HCL 25 MG/5ML IV SOLN
INTRAVENOUS | Status: AC
  Filled 2012-10-03: qty 5

## 2012-10-03 MED ORDER — METOPROLOL TARTRATE 25 MG PO TABS
25.0000 mg | ORAL_TABLET | Freq: Four times a day (QID) | ORAL | Status: DC
  Administered 2012-10-03 – 2012-10-04 (×2): 25 mg via ORAL
  Filled 2012-10-03 (×6): qty 1

## 2012-10-03 MED ORDER — MAGNESIUM SULFATE IN D5W 10-5 MG/ML-% IV SOLN
1.0000 g | Freq: Once | INTRAVENOUS | Status: AC
  Administered 2012-10-03: 1 g via INTRAVENOUS
  Filled 2012-10-03: qty 100

## 2012-10-03 MED ORDER — SODIUM CHLORIDE 0.9 % IV SOLN: 250.0000 mL | INTRAVENOUS | Status: DC | PRN

## 2012-10-03 MED ORDER — NITROGLYCERIN 0.4 MG SL SUBL: 0.4000 mg | SUBLINGUAL_TABLET | SUBLINGUAL | Status: DC | PRN

## 2012-10-03 MED ORDER — ACETAMINOPHEN 325 MG PO TABS
650.0000 mg | ORAL_TABLET | Freq: Once | ORAL | Status: AC
  Administered 2012-10-03: 650 mg via ORAL

## 2012-10-03 MED ORDER — DILTIAZEM HCL 25 MG/5ML IV SOLN
10.0000 mg | Freq: Once | INTRAVENOUS | Status: AC
  Administered 2012-10-03: 10 mg via INTRAVENOUS

## 2012-10-03 MED ORDER — ACETAMINOPHEN 325 MG PO TABS: 650.0000 mg | ORAL_TABLET | ORAL | Status: DC | PRN

## 2012-10-03 MED ORDER — SODIUM CHLORIDE 0.9 % IJ SOLN: 3.0000 mL | INTRAMUSCULAR | Status: DC | PRN

## 2012-10-03 MED ORDER — ALPRAZOLAM 0.5 MG PO TABS: 0.5000 mg | ORAL_TABLET | Freq: Every day | ORAL | Status: DC | PRN

## 2012-10-03 MED ORDER — DILTIAZEM HCL 100 MG IV SOLR
5.0000 mg/h | INTRAVENOUS | Status: DC
  Administered 2012-10-03: 15 mg/h via INTRAVENOUS
  Administered 2012-10-03: 5 mg/h via INTRAVENOUS
  Administered 2012-10-04: 15 mg/h via INTRAVENOUS
  Filled 2012-10-03 (×2): qty 100

## 2012-10-03 MED ORDER — APIXABAN 5 MG PO TABS
5.0000 mg | ORAL_TABLET | Freq: Two times a day (BID) | ORAL | Status: DC
  Administered 2012-10-03 – 2012-10-04 (×2): 5 mg via ORAL
  Filled 2012-10-03 (×3): qty 1

## 2012-10-03 MED ORDER — DILTIAZEM HCL 100 MG IV SOLR
INTRAVENOUS | Status: AC
  Filled 2012-10-03: qty 100

## 2012-10-03 MED ORDER — ZOLPIDEM TARTRATE 5 MG PO TABS: 5.0000 mg | ORAL_TABLET | Freq: Every evening | ORAL | Status: DC | PRN

## 2012-10-03 MED ORDER — MAGNESIUM SULFATE 50 % IJ SOLN: 1.0000 g | Freq: Once | INTRAMUSCULAR | Status: DC

## 2012-10-03 NOTE — ED Notes (Signed)
Patient states she is having SOB and gets "winded" easily.

## 2012-10-03 NOTE — ED Provider Notes (Addendum)
CSN: [id number]     Arrival date & time 10/03/12  1500 History     First MD Initiated Contact with Patient 10/03/12 1540     Chief Complaint  Patient presents with  . Shortness of Breath   (Consider location/radiation/quality/duration/timing/severity/associated sxs/prior Treatment) HPI Comments: Patient has not felt well for the last 4-5 days but states she did not become short of breath to last night. She also states she's had intermittent palpitations. She had 4 days of diarrhea with 6-10 episodes daily which resolved yesterday. The stools were nonbloody. She's also had intermittent nausea but no vomiting.  Patient is a 42 y.o. female presenting with shortness of breath. The history is provided by the patient.  Shortness of Breath Severity:  Severe Onset quality:  Gradual Duration:  1 day Timing:  Constant Progression:  Unchanged Chronicity:  New Context comment:  Sob started last night Relieved by:  Nothing Worsened by:  Exertion Ineffective treatments:  None tried Associated symptoms: diaphoresis   Associated symptoms: no abdominal pain, no chest pain, no cough, no vomiting and no wheezing   Associated symptoms comment:  Nausea Risk factors: obesity   Risk factors: no hx of cancer, no hx of PE/DVT, no oral contraceptive use, no prolonged immobilization, no recent surgery and no tobacco use   Risk factors comment:  No family history of MI in parents or siblings by her grandparents had MIs in their old age   Past Medical History  Diagnosis Date  . Migraines   . GERD (gastroesophageal reflux disease)   . Gastroparesis   . Hypertension   . Kidney stone   . Sleep apnea   . Restless legs    Past Surgical History  Procedure Laterality Date  . Dilation and curettage of uterus    . Tubal ligation    . Cesarean section     Family History  Problem Relation Age of Onset  . Diabetes Mother   . Hypertension Mother    History  Substance Use Topics  . Smoking status: Never  Smoker   . Smokeless tobacco: Never Used  . Alcohol Use: No   OB History   Grav Para Term Preterm Abortions TAB SAB Ect Mult Living                 Review of Systems  Constitutional: Positive for diaphoresis.  Respiratory: Positive for shortness of breath. Negative for cough and wheezing.   Cardiovascular: Negative for chest pain.  Gastrointestinal: Negative for vomiting and abdominal pain.  All other systems reviewed and are negative.    Allergies  Iodides; Imitrex; and Metoclopramide hcl  Home Medications   Current Outpatient Rx  Name  Route  Sig  Dispense  Refill  . albuterol (PROVENTIL HFA;VENTOLIN HFA) 108 (90 BASE) MCG/ACT inhaler   Inhalation   Inhale 2 puffs into the lungs every 2 (two) hours as needed for wheezing or shortness of breath (cough).   1 Inhaler   0   . ALPRAZolam (XANAX) 0.5 MG tablet   Oral   Take 0.5 mg by mouth daily as needed. For anxiety         . atorvastatin (LIPITOR) 10 MG tablet   Oral   Take 10 mg by mouth daily.         . butalbital-acetaminophen-caffeine (FIORICET, ESGIC) 50-325-40 MG per tablet      TAKE 1 TABLET TWICE A DAY AS NEEDED FOR HEADACHE   60 tablet   3   . dexlansoprazole (DEXILANT)  60 MG capsule   Oral   Take 60 mg by mouth 2 (two) times daily.         . diphenhydrAMINE (BENADRYL) 25 MG tablet   Oral   Take 50 mg by mouth every 4 (four) hours as needed. For runny nose and sneezing         . FROVA 2.5 MG tablet      TAKE 1 TAB AS NEEDED FOR MIGRAINE MAY REPEAT AFTER 2 HOURS MAX OF 3 TABS IN 24 HOURS   10 tablet   2   . gabapentin (NEURONTIN) 800 MG tablet   Oral   Take 1 tablet (800 mg total) by mouth 4 (four) times daily.   120 tablet   3   . ibuprofen (ADVIL,MOTRIN) 200 MG tablet   Oral   Take 400 mg by mouth 2 (two) times daily as needed. For pain         . labetalol (NORMODYNE) 300 MG tablet   Oral   Take 600 mg by mouth 2 (two) times daily.          . meloxicam (MOBIC) 15 MG  tablet      TAKE 1 TABLET BY MOUTH EVERY MORNING WITH FOOD   30 tablet   0   . methocarbamol (ROBAXIN) 750 MG tablet      TAKE 1 TABLET EVERY 6 HOURS AS NEEDED   75 tablet   4   . oxyCODONE-acetaminophen (PERCOCET/ROXICET) 5-325 MG per tablet   Oral   Take 1 tablet by mouth every 6 (six) hours as needed.   90 tablet   0   . promethazine (PHENERGAN) 25 MG tablet   Oral   Take 25 mg by mouth every 6 (six) hours as needed. For nausea          . SUMAtriptan (IMITREX) 6 MG/0.5ML SOLN injection   Subcutaneous   Inject 6 mg into the skin every 2 (two) hours as needed for migraine or headache. F         . topiramate (TOPAMAX) 50 MG tablet   Oral   Take 1-2 tablets (50-100 mg total) by mouth at bedtime.   60 tablet   4   . zolpidem (AMBIEN) 10 MG tablet   Oral   Take 10 mg by mouth at bedtime as needed. For sleep          BP 128/73  Pulse 161  Temp(Src) 98.1 F (36.7 C) (Oral)  Resp 24  Ht 5\' 4"  (1.626 m)  Wt 212 lb (96.163 kg)  BMI 36.37 kg/m2  SpO2 100% Physical Exam  Nursing note and vitals reviewed. Constitutional: She is oriented to person, place, and time. She appears well-developed and well-nourished. No distress.  HENT:  Head: Normocephalic and atraumatic.  Mouth/Throat: Oropharynx is clear and moist. Mucous membranes are dry.  Eyes: Conjunctivae and EOM are normal. Pupils are equal, round, and reactive to light.  Neck: Normal range of motion. Neck supple.  Cardiovascular: Intact distal pulses.  An irregularly irregular rhythm present. Tachycardia present.   No murmur heard. Pulmonary/Chest: Effort normal and breath sounds normal. No respiratory distress. She has no wheezes. She has no rales.  Abdominal: Soft. She exhibits no distension. There is no tenderness. There is no rebound and no guarding.  Musculoskeletal: Normal range of motion. She exhibits no edema and no tenderness.  Neurological: She is alert and oriented to person, place, and time.   Skin: No rash noted. She is diaphoretic. No  erythema.  Skin is cool and clammy  Psychiatric: She has a normal mood and affect. Her behavior is normal.    ED Course   Procedures (including critical care time)  Labs Reviewed  CBC WITH DIFFERENTIAL - Abnormal; Notable for the following:    Hemoglobin 11.2 (*)    HCT 35.8 (*)    All other components within normal limits  COMPREHENSIVE METABOLIC PANEL - Abnormal; Notable for the following:    Glucose, Bld 128 (*)    Total Bilirubin 0.1 (*)    All other components within normal limits  LIPASE, BLOOD  TROPONIN I  URINALYSIS, ROUTINE W REFLEX MICROSCOPIC  LACTIC ACID, PLASMA  CG4 I-STAT (LACTIC ACID)   Dg Chest 2 View  10/03/2012   *RADIOLOGY REPORT*  Clinical Data: Shortness of breath, rapid heart rate  CHEST - 2 VIEW  Comparison: Chest x-ray of 10/18/2011  Findings: The lungs are not well aerated with mild basilar volume loss.  No infiltrate or effusion is seen.  The heart is within upper limits of normal.  No acute bony abnormality is seen.  IMPRESSION: Poor inspiration with mild basilar volume loss.  No active process.   Original Report Authenticated By: Dwyane Dee, M.D.    Date: 10/03/2012  Rate: 156  Rhythm: atrial fibrillation  QRS Axis: normal  Intervals: a.fib  ST/T Wave abnormalities: normal  Conduction Disutrbances:none  Narrative Interpretation:   Old EKG Reviewed: changes noted CRITICAL CARE Performed by: Gwyneth Sprout Total critical care time: 45 Critical care time was exclusive of separately billable procedures and treating other patients. Critical care was necessary to treat or prevent imminent or life-threatening deterioration. Critical care was time spent personally by me on the following activities: development of treatment plan with patient and/or surrogate as well as nursing, discussions with consultants, evaluation of patient's response to treatment, examination of patient, obtaining history from patient or  surrogate, ordering and performing treatments and interventions, ordering and review of laboratory studies, ordering and review of radiographic studies, pulse oximetry and re-evaluation of patient's condition.   1. Atrial fibrillation with RVR     MDM   Due to shortness of breath and palpitations and not feeling well for the last 4 days. She denies any chest pain but states her acid reflux has been worse. No prior history of cardiac disease but patient does have hypertension and hyperlipidemia. On exam patient has a rapid irregularly irregular heartbeat and I suspect atrial fibrillation which is most likely the cause of her shortness of breath. She has no risk factors concerning for PE at this time. She also states over the last few day she's had recurrent diarrhea which resolved yesterday. She denies any abdominal pain or bloody stools. EKG confirms A. fib RVR.  CBC, CMP, lipase, UA, lactate, troponin pending.  Patient given IV fluids as she states she has not had anything to drink today and has eaten very little. She denies any recent medication changes but has been under a lot of stress recently.  Patient started on IV Cardizem drip and bolus with improvement in heart rate from the 150s to the 100s. We'll continue to monitor.  5:10 PM Labs and x-ray without acute findings. Initially controlled with Cardizem bolus and 5 however on reevaluation patient's heart rate is in the 120s to 130s we'll increase Cardizem drip to 10.  Will consult with cardiology   Gwyneth Sprout, MD 10/03/12 1711  Gwyneth Sprout, MD 10/03/12 4782

## 2012-10-03 NOTE — H&P (Signed)
Physician History and Physical    Sandra [name] MRN: [id number] DOB/AGE: November 03, 1970 42 y.o. Admit date: 10/03/2012  Primary Cardiologist: Eden Emms  HPI: 42 yo with history of HTN, OSA, and hyperlipidemia presented with rapid heart rate and was found to have atrial fibrillation with RVR.  Patient states that she has been under a lot of stress recently.  She felt physically fine, however, until this morning.  This morning, she noted her heart racing.  No chest pain, no dyspnea, no lightheadedness.  It raced all day.  She initially attributed it to anxiety, but when it did not get better, she went to the ER where she was found to be in atrial fibrillation with RVR.  She was started on diltiazem gtt with HR now in the 110s. Besides the palpitations, she has been asymptomatic.  As above, she did not note the palpitations until this morning.    Patient has history of treated HTN and untreated OSA.  She is very busy at home with 5 children.    PMH: 1. Migraines 2. Obesity 3. Chronic low back pain 4. HTN: Echo (1/12) with EF 55-60%, mild LAE 5. Gestational diabetes 6. Hyperlipidemia 7. GERD 8. RLS 9. OSA: Not on CPAP  Review of systems complete and found to be negative unless listed above   Family History  Problem Relation Age of Onset  . Diabetes Mother   . Hypertension Mother   - Grandparents with MIs  History   Social History  . Marital Status: Married    Spouse Name: N/A    Number of Children: N/A  . Years of Education: 5   Occupational History  . Not on file.   Social History Main Topics  . Smoking status: Never Smoker   . Smokeless tobacco: Never Used  . Alcohol Use: No  . Drug Use: No  . Sexual Activity: Not on file   Other Topics Concern  . Not on file   Social History Narrative  . No narrative on file     Prescriptions prior to admission  Medication Sig Dispense Refill  . albuterol (PROVENTIL HFA;VENTOLIN HFA) 108 (90 BASE) MCG/ACT inhaler Inhale 2 puffs  into the lungs every 2 (two) hours as needed for wheezing or shortness of breath (cough).  1 Inhaler  0  . ALPRAZolam (XANAX) 0.5 MG tablet Take 0.5 mg by mouth daily as needed. For anxiety      . atorvastatin (LIPITOR) 10 MG tablet Take 10 mg by mouth daily.      . butalbital-acetaminophen-caffeine (FIORICET, ESGIC) 50-325-40 MG per tablet TAKE 1 TABLET TWICE A DAY AS NEEDED FOR HEADACHE  60 tablet  3  . dexlansoprazole (DEXILANT) 60 MG capsule Take 60 mg by mouth 2 (two) times daily.      . diphenhydrAMINE (BENADRYL) 25 MG tablet Take 50 mg by mouth every 4 (four) hours as needed. For runny nose and sneezing      . FROVA 2.5 MG tablet TAKE 1 TAB AS NEEDED FOR MIGRAINE MAY REPEAT AFTER 2 HOURS MAX OF 3 TABS IN 24 HOURS  10 tablet  2  . gabapentin (NEURONTIN) 800 MG tablet Take 1 tablet (800 mg total) by mouth 4 (four) times daily.  120 tablet  3  . ibuprofen (ADVIL,MOTRIN) 200 MG tablet Take 400 mg by mouth 2 (two) times daily as needed. For pain      . labetalol (NORMODYNE) 300 MG tablet Take 600 mg by mouth 2 (two) times daily.       Marland Kitchen  meloxicam (MOBIC) 15 MG tablet TAKE 1 TABLET BY MOUTH EVERY MORNING WITH FOOD  30 tablet  0  . methocarbamol (ROBAXIN) 750 MG tablet TAKE 1 TABLET EVERY 6 HOURS AS NEEDED  75 tablet  4  . oxyCODONE-acetaminophen (PERCOCET/ROXICET) 5-325 MG per tablet Take 1 tablet by mouth every 6 (six) hours as needed.  90 tablet  0  . promethazine (PHENERGAN) 25 MG tablet Take 25 mg by mouth every 6 (six) hours as needed. For nausea       . SUMAtriptan (IMITREX) 6 MG/0.5ML SOLN injection Inject 6 mg into the skin every 2 (two) hours as needed for migraine or headache. F      . topiramate (TOPAMAX) 50 MG tablet Take 1-2 tablets (50-100 mg total) by mouth at bedtime.  60 tablet  4  . zolpidem (AMBIEN) 10 MG tablet Take 10 mg by mouth at bedtime as needed. For sleep        Physical Exam: Blood pressure 119/82, pulse 125, temperature 97.6 F (36.4 C), temperature source Oral,  resp. rate 18, height 5\' 4"  (1.626 m), weight 96.163 kg (212 lb), last menstrual period 10/02/2012, SpO2 95.00%.  General: NAD Neck: No JVD, no thyromegaly or thyroid nodule.  Lungs: Clear to auscultation bilaterally with normal respiratory effort. CV: Nondisplaced PMI.  Heart regular S1/S2, no S3/S4, no murmur.  No peripheral edema.  No carotid bruit.  Normal pedal pulses.  Abdomen: Soft, nontender, no hepatosplenomegaly, no distention.  Skin: Intact without lesions or rashes.  Neurologic: Alert and oriented x 3.  Psych: Normal affect. Extremities: No clubbing or cyanosis.  HEENT: Normal.   Labs:   Lab Results  Component Value Date   WBC 7.5 10/03/2012   HGB 11.2* 10/03/2012   HCT 35.8* 10/03/2012   MCV 85.4 10/03/2012   PLT 210 10/03/2012    Recent Labs Lab 10/03/12 1545  NA 140  K 4.0  CL 105  CO2 25  BUN 10  CREATININE 0.80  CALCIUM 9.8  PROT 7.1  BILITOT 0.1*  ALKPHOS 54  ALT 17  AST 15  GLUCOSE 128*   Lab Results  Component Value Date   TROPONINI <0.30 10/03/2012      Radiology:  - CXR: no active disease  EKG: atrial fibrillation with RVR  ASSESSMENT AND PLAN: 42 yo with history of HTN, OSA, and hyperlipidemia presented with rapid heart rate and was found to have atrial fibrillation with RVR. No prior history of atrial fibrillation.  The onset seems to have been this morning (she felt normal yesterday).  She has been under a lot of stress.  No known thyroid disease, presentation not consistent with PE (no dyspnea, chest pain, or trigger).  RFs are female gender, HTN, and OSA.  CHADSVASC 2 (gender, HTN).   - Continue diltiazem gtt, add metoprolol 25 mg every 6 hrs for rate control.  - Start Eliquis 5 mg bid (will need to check to make sure that Medicaid will cover).   - If she does not convert to NSR overnight, she will be well within 48 hrs of symptom onset and could consider DCCV in the morning.   - Will get echocardiogram, check TSH.  - She will need to have  OSA treated.  - If possible, will try to get home tomorrow given multiple young children at home.   Signed: Marca Ancona 10/03/2012, 8:29 PM

## 2012-10-03 NOTE — ED Notes (Signed)
Patient is resting comfortably.  Pt states her breathing is better.

## 2012-10-03 NOTE — Progress Notes (Signed)
ANTICOAGULATION CONSULT NOTE - Initial Consult  Pharmacy Consult for Apixaban Indication: atrial fibrillation, new onset  Allergies  Allergen Reactions  . Iodides Hives  . Imitrex [Sumatriptan]     Pt states she takes imitrex injection-"sometimes it works and sometimes doesn't"  . Metoclopramide Hcl Anxiety    Patient Measurements: Height: 5\' 4"  (162.6 cm) Weight: 212 lb (96.163 kg) IBW/kg (Calculated) : 54.7  Vital Signs: Temp: 98.2 F (36.8 C) (08/19 2047) Temp src: Oral (08/19 2047) BP: 127/85 mmHg (08/19 2047) Pulse Rate: 103 (08/19 2047)  Labs:  Recent Labs  10/03/12 1545  HGB 11.2*  HCT 35.8*  PLT 210  CREATININE 0.80  TROPONINI <0.30    Estimated Creatinine Clearance: 105.2 ml/min (by C-G formula based on Cr of 0.8).   Medical History: Past Medical History  Diagnosis Date  . Migraines   . GERD (gastroesophageal reflux disease)   . Gastroparesis   . Hypertension   . Kidney stone   . Sleep apnea   . Restless legs   . Gestational diabetes   . H/O echocardiogram     a. 02/2010: done for htn/tachy while pregnant - EF 55-60%, grade 1 d/dysf, mildly dilated LA.   Assessment:  42 year old woman with new-onset atrial fibrillation with RVR.  Diltiazem drip begun, Metoprolol to begin.  To begin Apixaban tonight.    Good renal function. 96 kg.  Hgb 11.2  Goal of Therapy:  therapeutic anticoagulation Monitor platelets by anticoagulation protocol: Yes   Plan:   Eliquis 5 mg BID.  Will ask Case Manager to review for eligibilty for medication assistance.  Dennie Fetters, RPh Pager: [phone] 10/03/2012,10:10 PM

## 2012-10-04 ENCOUNTER — Encounter (HOSPITAL_COMMUNITY): Payer: Self-pay | Admitting: Physician Assistant

## 2012-10-04 DIAGNOSIS — I4891 Unspecified atrial fibrillation: Principal | ICD-10-CM

## 2012-10-04 LAB — HEMOGLOBIN A1C: Mean Plasma Glucose: 123 mg/dL — ABNORMAL HIGH (ref <117)

## 2012-10-04 LAB — BASIC METABOLIC PANEL
Calcium: 9 mg/dL (ref 8.4–10.5)
Creatinine, Ser: 0.73 mg/dL (ref 0.50–1.10)
GFR calc non Af Amer: >90 mL/min (ref >90)
Sodium: 140 mEq/L (ref 135–145)

## 2012-10-04 LAB — URINALYSIS, ROUTINE W REFLEX MICROSCOPIC
Glucose, UA: NEGATIVE mg/dL
Leukocytes, UA: NEGATIVE
Protein, ur: NEGATIVE mg/dL
pH: 6.5 (ref 5.0–8.0)

## 2012-10-04 LAB — PRO B NATRIURETIC PEPTIDE: Pro B Natriuretic peptide (BNP): 1444 pg/mL — ABNORMAL HIGH (ref 0–125)

## 2012-10-04 MED ORDER — LABETALOL HCL 300 MG PO TABS
600.0000 mg | ORAL_TABLET | Freq: Two times a day (BID) | ORAL | Status: DC
  Filled 2012-10-04 (×2): qty 2

## 2012-10-04 MED ORDER — LABETALOL HCL 300 MG PO TABS
600.0000 mg | ORAL_TABLET | Freq: Two times a day (BID) | ORAL | Status: DC
  Administered 2012-10-04: 600 mg via ORAL
  Filled 2012-10-04 (×2): qty 2

## 2012-10-04 MED ORDER — ASPIRIN 325 MG PO TBEC: 325.0000 mg | DELAYED_RELEASE_TABLET | Freq: Every day | ORAL | Status: DC

## 2012-10-04 MED ORDER — ASPIRIN EC 81 MG PO TBEC: 81.0000 mg | DELAYED_RELEASE_TABLET | Freq: Every day | ORAL | Status: DC

## 2012-10-04 MED ORDER — POTASSIUM CHLORIDE CRYS ER 20 MEQ PO TBCR
40.0000 meq | EXTENDED_RELEASE_TABLET | Freq: Once | ORAL | Status: AC
  Administered 2012-10-04: 40 meq via ORAL
  Filled 2012-10-04: qty 2

## 2012-10-04 NOTE — Care Management (Signed)
Case Manager did see pt in reference to Eliquis. Eliquis is not a preferred medication for medicaid. [facility] [address] did a test claim and cost will be 377.99. MD please call for prior authorization for medication @ [phone]. Then hopefully medication will be 3.00. Thanks Gala Lewandowsky, RN,BSN [phone]

## 2012-10-04 NOTE — Progress Notes (Signed)
Patient ID: Sandra [name], female   DOB: 11/29/1970, 42 y.o.   MRN: [id number]    Subjective:  Denies SSCP, palpitations or Dyspnea   Objective:  Filed Vitals:   10/03/12 1823 10/03/12 1945 10/03/12 2047 10/04/12 0539  BP: 110/69 119/82 127/85 121/84  Pulse:  125 103 95  Temp: 98.6 F (37 C) 97.6 F (36.4 C) 98.2 F (36.8 C) 97.4 F (36.3 C)  TempSrc: Oral Oral Oral Axillary  Resp: 22 18 18 18   Height:  5\' 4"  (1.626 m)    Weight:  212 lb (96.163 kg)    SpO2: 100% 95% 99% 99%    Intake/Output from previous day: No intake or output data in the 24 hours ending 10/04/12 0757  Physical Exam: Affect appropriate Healthy:  appears stated age HEENT: normal Neck supple with no adenopathy JVP normal no bruits no thyromegaly Lungs clear with no wheezing and good diaphragmatic motion Heart:  S1/S2 no murmur, no rub, gallop or click PMI normal Abdomen: benighn, BS positve, no tenderness, no AAA no bruit.  No HSM or HJR Distal pulses intact with no bruits No edema Neuro non-focal Skin warm and dry No muscular weakness   Lab Results: Basic Metabolic Panel:  Recent Labs  29/52/84 1545 10/04/12 0538  NA 140 140  K 4.0 3.2*  CL 105 103  CO2 25 26  GLUCOSE 128* 116*  BUN 10 7  CREATININE 0.80 0.73  CALCIUM 9.8 9.0   Liver Function Tests:  Recent Labs  10/03/12 1545  AST 15  ALT 17  ALKPHOS 54  BILITOT 0.1*  PROT 7.1  ALBUMIN 3.5    Recent Labs  10/03/12 1545  LIPASE 36   CBC:  Recent Labs  10/03/12 1545  WBC 7.5  NEUTROABS 4.9  HGB 11.2*  HCT 35.8*  MCV 85.4  PLT 210   Cardiac Enzymes:  Recent Labs  10/03/12 1545  TROPONINI <0.30    Imaging: Dg Chest 2 View  10/03/2012   *RADIOLOGY REPORT*  Clinical Data: Shortness of breath, rapid heart rate  CHEST - 2 VIEW  Comparison: Chest x-ray of 10/18/2011  Findings: The lungs are not well aerated with mild basilar volume loss.  No infiltrate or effusion is seen.  The heart is within upper  limits of normal.  No acute bony abnormality is seen.  IMPRESSION: Poor inspiration with mild basilar volume loss.  No active process.   Original Report Authenticated By: Dwyane Dee, M.D.    Cardiac Studies:  ECG:  afib nonspecific ST/T Wave changes   Telemetry:  afib rates 70-90  Echo:   Medications:   . apixaban  5 mg Oral BID  . atorvastatin  10 mg Oral QHS  . gabapentin  800 mg Oral TID PC & HS  . metoprolol tartrate  25 mg Oral Q6H  . pantoprazole  40 mg Oral Daily  . sodium chloride  3 mL Intravenous Q12H     . diltiazem (CARDIZEM) infusion 15 mg/hr (10/04/12 0522)    Assessment/Plan:  Afib:  See not from Dr Shirlee Latch  Onset afib clinically 4:00 am yesterday ON Eliquis low [place] score Has 6 kids at  Home  Plan for [facility] this am if possible to arrange with anesthesia and d/c latter today.  Echo as outpatient if needed Can f/u with Dr Shirlee Latch Chol:  Continue statin   Charlton Haws 10/04/2012, 7:57 AM

## 2012-10-04 NOTE — Progress Notes (Signed)
Echocardiogram 2D Echocardiogram has been performed.  Sandra [name] 10/04/2012, 9:49 AM

## 2012-10-04 NOTE — Discharge Summary (Signed)
Discharge Summary   Patient ID: Sandra [name] MRN: [id number], DOB/AGE: 40/40/1972 40 y.o. Admit date: 10/03/2012 D/C date:     10/04/2012  Primary Cardiologist: Eden Emms  Primary Discharge Diagnoses:  1. Newly recognized atrial fibrillation with RVR - spontaneously converted to NSR - TSH pending at time of discharge - MD has chosen ASA at this time 2. HTN 3. OSA, not on CPAP (claustrophobic, remotely used O2 by [place]) 4. Hyperlipidemia 5. Hyperglycemia - A1C pending at time of discharge 6. Mild anemia - instructed to f/u PCP for this and mod Hgb on UA  Secondary Discharge Diagnoses:  1. Migraines  2. Obesity  3. Chronic low back pain  4. Gestational diabetes  5. GERD  6. RLS 7. Gastroparesis 8. Anxiety 9. Kidney stones   Hospital Course: Sandra [name] is a 42 y/o F with history of HTN, OSA (remotely treated by O2 [place] due to claustrophobia), and hyperlipidemia presented initially to [facility] 10/03/2012 with rapid heart rate and was found to be in new onset atrial fibrillation with RVR. She was subsequently transferred to [facility] for further eval. She was remotely seen by Dr. Eden Emms in 2012 while pregnant for HTN and tachycardia and was found to have a fairly normal echo at that time with EF 55-60%, mild LAE. With regard to this admission, she was in her USOH the day of presentation. That morning she developed a sense of heart racing/palpitations. No chest pain, no dyspnea, no lightheadedness. It raced all day. She initially attributed it to anxiety, but when it did not get better, she went to the ER where she was found to be in atrial fibrillation with RVR. She was started on diltiazem gtt with improvement in HR. She was temporarily placed on apixaban and metoprolol. CXR was without active disease. Troponin was negative x 1. Lactic acid was WNL. Initially DCCV was planned, but the patient spontaneously converted to NSR. At this point in time CHADS2VASC2 score is known to be 2,  but we are awaiting echo/A1c result as below. Per discussion with Dr. Eden Emms, he would like to continue ASA only for now (no apixaban) and to continue home labetalol dose. If AF-RVR recurs, she may need addition of oral diltiazem. We have discontinued her Imitrex for now and asked her to discuss alternatives with PCP. She was also instructed to discuss CPAP/oxygen with them as it is very important that her OSA be treated. Dr. Eden Emms has seen and examined the patient today and feels she is stable for discharge.  There are a few issues outstanding at discharge: - TSH is in process - 2D echo result is pending (confirmed with Nishan OK to discharge prior to read) - we added on an A1c given hyperglycemia and h/o gestational diabetes - the above 2 factors may play a role in consideration for anticoagulation, so would direct attention to these upon follow-up - the patient was mildly anemic and UA showed mod Hgb. She was instructed to f/u PCP for further evaluation.  Discharge Vitals: Blood pressure 121/84, pulse 95, temperature 97.4 F (36.3 C), temperature source Axillary, resp. rate 18, height 5\' 4"  (1.626 m), weight 212 lb (96.163 kg), last menstrual period 10/02/2012, SpO2 99.00%.  Labs: Lab Results  Component Value Date   WBC 7.5 10/03/2012   HGB 11.2* 10/03/2012   HCT 35.8* 10/03/2012   MCV 85.4 10/03/2012   PLT 210 10/03/2012     Recent Labs Lab 10/03/12 1545 10/04/12 0538  NA 140 140  K 4.0 3.2*  CL 105 103  CO2 25 26  BUN 10 7  CREATININE 0.80 0.73  CALCIUM 9.8 9.0  PROT 7.1  --   BILITOT 0.1*  --   ALKPHOS 54  --   ALT 17  --   AST 15  --   GLUCOSE 128* 116*  K repleted prior to dc  Recent Labs  10/03/12 1545  TROPONINI <0.30     Diagnostic Studies/Procedures   Dg Chest 2 View8/19/2014   *RADIOLOGY REPORT*  Clinical Data: Shortness of breath, rapid heart rate  CHEST - 2 VIEW  Comparison: Chest x-ray of 10/18/2011  Findings: The lungs are not well aerated with mild  basilar volume loss.  No infiltrate or effusion is seen.  The heart is within upper limits of normal.  No acute bony abnormality is seen.  IMPRESSION: Poor inspiration with mild basilar volume loss.  No active process.   Original Report Authenticated By: Dwyane Dee, M.D.   2D echo results pending  Discharge Medications     Medication List    STOP taking these medications       SUMAtriptan 6 MG/0.5ML Soln injection  Commonly known as:  IMITREX      TAKE these medications       albuterol 108 (90 BASE) MCG/ACT inhaler  Commonly known as:  PROVENTIL HFA;VENTOLIN HFA  Inhale 2 puffs into the lungs every 2 (two) hours as needed for wheezing or shortness of breath (cough).     ALPRAZolam 0.5 MG tablet  Commonly known as:  XANAX  Take 0.5 mg by mouth daily as needed. For anxiety     aspirin 325 MG EC tablet  Take 1 tablet (325 mg total) by mouth daily.     atorvastatin 10 MG tablet  Commonly known as:  LIPITOR  Take 10 mg by mouth daily.     butalbital-acetaminophen-caffeine 50-325-40-30 MG per capsule  Commonly known as:  FIORICET WITH CODEINE  Take 1-2 capsules by mouth every 4 (four) hours as needed for headache or migraine.     DEXILANT 60 MG capsule  Generic drug:  dexlansoprazole  Take 60 mg by mouth 2 (two) times daily.     diphenhydrAMINE 25 MG tablet  Commonly known as:  BENADRYL  Take 50 mg by mouth 2 (two) times daily.     gabapentin 800 MG tablet  Commonly known as:  NEURONTIN  Take 1 tablet (800 mg total) by mouth 4 (four) times daily.     ibuprofen 200 MG tablet  Commonly known as:  ADVIL,MOTRIN  Take 400 mg by mouth 2 (two) times daily as needed. For pain     labetalol 300 MG tablet  Commonly known as:  NORMODYNE  Take 600 mg by mouth 2 (two) times daily.     methocarbamol 750 MG tablet  Commonly known as:  ROBAXIN  Take 750 mg by mouth every 6 (six) hours as needed (muscle aches).     oxyCODONE-acetaminophen 5-325 MG per tablet  Commonly known as:   PERCOCET/ROXICET  Take 1 tablet by mouth every 6 (six) hours as needed.     promethazine 25 MG tablet  Commonly known as:  PHENERGAN  - Take 25 mg by mouth every 6 (six) hours as needed. For nausea  -      topiramate 50 MG tablet  Commonly known as:  TOPAMAX  Take 1-2 tablets (50-100 mg total) by mouth at bedtime.     zolpidem 10 MG tablet  Commonly known as:  AMBIEN  Take 10 mg by mouth at bedtime.        Disposition   The patient will be discharged in stable condition to home. Discharge Orders   Future Appointments Provider Department Dept Phone   10/25/2012 2:30 PM Rosalio Macadamia, NP [facility] Main Office [place]) [phone]   Future Orders Complete By Expires   Diet - low sodium heart healthy  As directed    Discharge instructions  As directed    Comments:     Given your atrial fibrillation, talk to your doctor about alternatives to Imitrex as this can worsen arrhythmias.  Please follow up with your primary care doctor to set up CPAP/oxygen therapy for your sleep apnea - this is important for your heart. You were mildly anemic this admission with a hemoglobin of 11.2 (normal 12-15). Your urinalysis also suggested a small amount of microscopic blood. Please follow up with your primary care doctor to discuss.   Increase activity slowly  As directed      Follow-up Information   Follow up with Leanor Rubenstein, MD. (See discharge instructions section)    Specialty:  Family Medicine   Contact information:   [address] [place] [address] [phone]       Follow up with Norma Fredrickson, NP. [facility] 10/25/12 at 2:30pm)    Specialty:  Nurse Practitioner   Contact information:   [address] [place] [address] [phone]         Duration of Discharge Encounter: Greater than 30 minutes including physician and PA time.  Signed, Ronie Spies PA-C 10/04/2012, 12:41 PM

## 2012-10-16 ENCOUNTER — Other Ambulatory Visit: Payer: Self-pay | Admitting: Physical Medicine & Rehabilitation

## 2012-10-25 ENCOUNTER — Encounter: Payer: Medicaid Other | Admitting: Nurse Practitioner

## 2012-11-09 ENCOUNTER — Encounter: Payer: Self-pay | Admitting: Nurse Practitioner

## 2012-11-09 ENCOUNTER — Ambulatory Visit (INDEPENDENT_AMBULATORY_CARE_PROVIDER_SITE_OTHER): Payer: Medicaid Other | Admitting: Nurse Practitioner

## 2012-11-09 VITALS — BP 130/80 | HR 68 | Ht 64.0 in | Wt 215.1 lb

## 2012-11-09 DIAGNOSIS — I4891 Unspecified atrial fibrillation: Secondary | ICD-10-CM

## 2012-11-09 DIAGNOSIS — I480 Paroxysmal atrial fibrillation: Secondary | ICD-10-CM

## 2012-11-09 LAB — CBC WITH DIFFERENTIAL/PLATELET
Basophils Absolute: 0 10*3/uL (ref 0.0–0.1)
Basophils Relative: 0.5 % (ref 0.0–3.0)
Eosinophils Absolute: 0.1 10*3/uL (ref 0.0–0.7)
Eosinophils Relative: 1.1 % (ref 0.0–5.0)
HCT: 30 % — ABNORMAL LOW (ref 36.0–46.0)
Hemoglobin: 9.5 g/dL — ABNORMAL LOW (ref 12.0–15.0)
Lymphocytes Relative: 32.6 % (ref 12.0–46.0)
Lymphs Abs: 1.6 10*3/uL (ref 0.7–4.0)
MCHC: 31.8 g/dL (ref 30.0–36.0)
MCV: 81.5 fl (ref 78.0–100.0)
Monocytes Absolute: 0.4 10*3/uL (ref 0.1–1.0)
Monocytes Relative: 8.3 % (ref 3.0–12.0)
Neutro Abs: 2.8 10*3/uL (ref 1.4–7.7)
Neutrophils Relative %: 57.5 % (ref 43.0–77.0)
Platelets: 213 10*3/uL (ref 150.0–400.0)
RBC: 3.69 Mil/uL — ABNORMAL LOW (ref 3.87–5.11)
RDW: 16 % — ABNORMAL HIGH (ref 11.5–14.6)
WBC: 4.9 10*3/uL (ref 4.5–10.5)

## 2012-11-09 LAB — BASIC METABOLIC PANEL
BUN: 8 mg/dL (ref 6–23)
CO2: 28 mEq/L (ref 19–32)
Calcium: 8.5 mg/dL (ref 8.4–10.5)
Chloride: 108 mEq/L (ref 96–112)
Creatinine, Ser: 0.7 mg/dL (ref 0.4–1.2)
GFR: 101.51 mL/min (ref >60.00)
Glucose, Bld: 90 mg/dL (ref 70–99)
Potassium: 3.7 mEq/L (ref 3.5–5.1)
Sodium: 139 mEq/L (ref 135–145)

## 2012-11-09 NOTE — Progress Notes (Signed)
Sandra [name] Date of Birth: [birth year] Medical Record #[id number]  History of Present Illness: Ms. Wisner is seen back today for a post hospital visit. Seen for Dr. Eden Emms. She has HTN, OSA (not on CPAP due to claustrophobia), HLD, mild anemia, obesity, chronic back pain, GERD, RLS, gastroparesis, anxiety and kidney stones.   She had HTN & tachycardia while pregnant and had seen Dr. Eden Emms back in 2012. Normal echo at that time.   Most recently admitted back in August with atrial fib with RVR - converted spontaneously to NSR. Started on diltiazem gtt with improvement in her HR and temporarily placed on Eliqus and metoprolol. DCCV was planned but she converted spontaneously. Dr. Eden Emms has recommended aspirin therapy and the addition of oral diltiazem if she has recurrence. Her Imitrex was discontinued and she was asked to follow up with her PCP regarding alternative therapy. Her echo was pending and an A1C was pending at discharge - these turned out ok. She was mildly anemic as well.   Comes back today. Here alone. Doing ok. Has not been back to see her PCP. Feels fine. No palpitations. Not dizzy or lightheaded. Has some right sided chest wall pain that hurts with palpation.    Current Outpatient Prescriptions  Medication Sig Dispense Refill  . ALPRAZolam (XANAX) 0.5 MG tablet Take 0.5 mg by mouth daily as needed. For anxiety      . aspirin EC 325 MG EC tablet Take 1 tablet (325 mg total) by mouth daily.      Marland Kitchen atorvastatin (LIPITOR) 10 MG tablet Take 10 mg by mouth daily.      . butalbital-acetaminophen-caffeine (FIORICET WITH CODEINE) 50-325-40-30 MG per capsule Take 1-2 capsules by mouth every 4 (four) hours as needed for headache or migraine.      Marland Kitchen dexlansoprazole (DEXILANT) 60 MG capsule Take 60 mg by mouth 2 (two) times daily.      . diphenhydrAMINE (BENADRYL) 25 MG tablet Take 50 mg by mouth 2 (two) times daily.      Marland Kitchen gabapentin (NEURONTIN) 800 MG tablet Take 1 tablet (800 mg total)  by mouth 4 (four) times daily.  120 tablet  3  . ibuprofen (ADVIL,MOTRIN) 200 MG tablet Take 400 mg by mouth 2 (two) times daily as needed. For pain      . labetalol (NORMODYNE) 300 MG tablet Take 600 mg by mouth 2 (two) times daily.       . methocarbamol (ROBAXIN) 750 MG tablet Take 750 mg by mouth every 6 (six) hours as needed (muscle aches).      . oxyCODONE-acetaminophen (PERCOCET/ROXICET) 5-325 MG per tablet Take 1 tablet by mouth every 6 (six) hours as needed.  90 tablet  0  . promethazine (PHENERGAN) 25 MG tablet Take 25 mg by mouth every 6 (six) hours as needed. For nausea       . topiramate (TOPAMAX) 50 MG tablet Take 1-2 tablets (50-100 mg total) by mouth at bedtime.  60 tablet  4  . zolpidem (AMBIEN) 10 MG tablet Take 10 mg by mouth at bedtime.      Marland Kitchen albuterol (PROVENTIL HFA;VENTOLIN HFA) 108 (90 BASE) MCG/ACT inhaler Inhale 2 puffs into the lungs every 2 (two) hours as needed for wheezing or shortness of breath (cough).  1 Inhaler  0   No current facility-administered medications for this visit.    Allergies  Allergen Reactions  . Iodides Hives  . Imitrex [Sumatriptan]     Pt states she takes imitrex injection-"sometimes  it works and sometimes doesn't"  . Metoclopramide Hcl Anxiety    Past Medical History  Diagnosis Date  . GERD (gastroesophageal reflux disease)   . Gastroparesis   . Hypertension   . Restless legs   . H/O echocardiogram     a. 02/2010: done for htn/tachy while pregnant - EF 55-60%, grade 1 d/dysf, mildly dilated LA.  . High cholesterol   . Atrial fibrillation with RVR     a. Dx 09/2012, spont conv to NSR.  Marland Kitchen Sleep apnea   . Gestational diabetes   . Migraines   . Chronic lower back pain   . Anxiety   . Kidney stones   . Anemia     a. Mild with Hgb 11.2, 09/2012. UA with mod Hgb. Instructed to f/u PCP.    Past Surgical History  Procedure Laterality Date  . Tubal ligation  2012  . Cesarean section  2012  . Dilation and curettage of uterus  2006;  2010  . Cystoscopy/retrograde/ureteroscopy/stone extraction with basket  1987  . Cystoscopy w/ ureteral stent placement  2006    History  Smoking status  . Never Smoker   Smokeless tobacco  . Never Used    History  Alcohol Use No    Family History  Problem Relation Age of Onset  . Diabetes Mother   . Hypertension Mother     Review of Systems: The review of systems is per the HPI.  All other systems were reviewed and are negative.  Physical Exam: BP 130/80  Pulse 68  Ht 5\' 4"  (1.626 m)  Wt 215 lb 1.9 oz (97.578 kg)  BMI 36.91 kg/m2 Patient is alert and in no acute distress. Affect is quite flat. She is obese. Skin is warm and dry. Color is normal.  HEENT is unremarkable. Normocephalic/atraumatic. PERRL. Sclera are nonicteric. Neck is supple. No masses. No JVD. Lungs are clear. Cardiac exam shows a regular rate and rhythm. Abdomen is soft. Extremities are without edema. Gait and ROM are intact. No gross neurologic deficits noted.  LABORATORY DATA: PENDING  Lab Results  Component Value Date   WBC 7.5 10/03/2012   HGB 11.2* 10/03/2012   HCT 35.8* 10/03/2012   PLT 210 10/03/2012   GLUCOSE 116* 10/04/2012   ALT 17 10/03/2012   AST 15 10/03/2012   NA 140 10/04/2012   K 3.2* 10/04/2012   CL 103 10/04/2012   CREATININE 0.73 10/04/2012   BUN 7 10/04/2012   CO2 26 10/04/2012   TSH 1.269 10/04/2012   HGBA1C 5.9* 10/04/2012   Echo Study Conclusions  - Left ventricle: The cavity size was normal. Systolic function was normal. The estimated ejection fraction was in the range of 55% to 60%. Wall motion was normal; there were no regional wall motion abnormalities. - Atrial septum: No defect or patent foramen ovale was identified.   Assessment / Plan: 1. Lone episode of atrial fib - in sinus today and has not had recurrence.   2. Normal LV function per echo.   3. Hyperglycemia - A1C was ok. Would manage with risk factor modification.   4. HTN - BP ok.   5. OSA - not treated -  does not seem interested in making changes.   6. Anemia - to follow up with her PCP.   We will see her back as needed. No change in current regimen.   Patient is agreeable to this plan and will call if any problems develop in the interim.   Nadeen Shipman C.  Tyrone Sage RN, [facility] [address] [place], [place]  [address]

## 2012-11-09 NOTE — Patient Instructions (Addendum)
We need to recheck your labs today  We will see you back as needed  Call the [facility] office at [phone] if you have any questions, problems or concerns.

## 2012-11-13 ENCOUNTER — Other Ambulatory Visit: Payer: Self-pay | Admitting: Physical Medicine & Rehabilitation

## 2012-11-14 ENCOUNTER — Encounter: Payer: Self-pay | Admitting: *Deleted

## 2012-12-21 ENCOUNTER — Other Ambulatory Visit: Payer: Self-pay

## 2013-02-12 ENCOUNTER — Inpatient Hospital Stay (HOSPITAL_COMMUNITY)
Admission: EM | Admit: 2013-02-12 | Discharge: 2013-02-14 | DRG: 917 | Disposition: A | Discharge status: Home / Self Care | Payer: Medicaid Other | Attending: Pulmonary Disease | Admitting: Pulmonary Disease

## 2013-02-12 ENCOUNTER — Emergency Department (HOSPITAL_COMMUNITY): Payer: Medicaid Other

## 2013-02-12 DIAGNOSIS — G929 Unspecified toxic encephalopathy: Secondary | ICD-10-CM | POA: Diagnosis present

## 2013-02-12 DIAGNOSIS — R569 Unspecified convulsions: Secondary | ICD-10-CM | POA: Diagnosis present

## 2013-02-12 DIAGNOSIS — Z638 Other specified problems related to primary support group: Secondary | ICD-10-CM

## 2013-02-12 DIAGNOSIS — Y92009 Unspecified place in unspecified non-institutional (private) residence as the place of occurrence of the external cause: Secondary | ICD-10-CM

## 2013-02-12 DIAGNOSIS — T426X2A Poisoning by other antiepileptic and sedative-hypnotic drugs, intentional self-harm, initial encounter: Secondary | ICD-10-CM | POA: Diagnosis present

## 2013-02-12 DIAGNOSIS — K3184 Gastroparesis: Secondary | ICD-10-CM | POA: Diagnosis present

## 2013-02-12 DIAGNOSIS — G2581 Restless legs syndrome: Secondary | ICD-10-CM | POA: Diagnosis present

## 2013-02-12 DIAGNOSIS — E872 Acidosis, unspecified: Secondary | ICD-10-CM | POA: Diagnosis present

## 2013-02-12 DIAGNOSIS — Z79899 Other long term (current) drug therapy: Secondary | ICD-10-CM

## 2013-02-12 DIAGNOSIS — Z7982 Long term (current) use of aspirin: Secondary | ICD-10-CM

## 2013-02-12 DIAGNOSIS — R0602 Shortness of breath: Secondary | ICD-10-CM

## 2013-02-12 DIAGNOSIS — R000 Tachycardia, unspecified: Secondary | ICD-10-CM | POA: Diagnosis present

## 2013-02-12 DIAGNOSIS — T40601A Poisoning by unspecified narcotics, accidental (unintentional), initial encounter: Secondary | ICD-10-CM | POA: Diagnosis present

## 2013-02-12 DIAGNOSIS — G8929 Other chronic pain: Secondary | ICD-10-CM | POA: Diagnosis present

## 2013-02-12 DIAGNOSIS — T424X4A Poisoning by benzodiazepines, undetermined, initial encounter: Principal | ICD-10-CM | POA: Diagnosis present

## 2013-02-12 DIAGNOSIS — Z888 Allergy status to other drugs, medicaments and biological substances status: Secondary | ICD-10-CM

## 2013-02-12 DIAGNOSIS — Z833 Family history of diabetes mellitus: Secondary | ICD-10-CM

## 2013-02-12 DIAGNOSIS — J9600 Acute respiratory failure, unspecified whether with hypoxia or hypercapnia: Secondary | ICD-10-CM | POA: Diagnosis present

## 2013-02-12 DIAGNOSIS — G9340 Encephalopathy, unspecified: Secondary | ICD-10-CM

## 2013-02-12 DIAGNOSIS — M545 Low back pain, unspecified: Secondary | ICD-10-CM | POA: Diagnosis present

## 2013-02-12 DIAGNOSIS — F411 Generalized anxiety disorder: Secondary | ICD-10-CM | POA: Diagnosis present

## 2013-02-12 DIAGNOSIS — Z8249 Family history of ischemic heart disease and other diseases of the circulatory system: Secondary | ICD-10-CM

## 2013-02-12 DIAGNOSIS — T50902A Poisoning by unspecified drugs, medicaments and biological substances, intentional self-harm, initial encounter: Secondary | ICD-10-CM | POA: Diagnosis present

## 2013-02-12 DIAGNOSIS — T4272XA Poisoning by unspecified antiepileptic and sedative-hypnotic drugs, intentional self-harm, initial encounter: Secondary | ICD-10-CM | POA: Diagnosis present

## 2013-02-12 DIAGNOSIS — G43909 Migraine, unspecified, not intractable, without status migrainosus: Secondary | ICD-10-CM | POA: Diagnosis present

## 2013-02-12 DIAGNOSIS — K219 Gastro-esophageal reflux disease without esophagitis: Secondary | ICD-10-CM

## 2013-02-12 DIAGNOSIS — T43502A Poisoning by unspecified antipsychotics and neuroleptics, intentional self-harm, initial encounter: Secondary | ICD-10-CM | POA: Diagnosis present

## 2013-02-12 DIAGNOSIS — J690 Pneumonitis due to inhalation of food and vomit: Secondary | ICD-10-CM | POA: Diagnosis present

## 2013-02-12 DIAGNOSIS — I10 Essential (primary) hypertension: Secondary | ICD-10-CM | POA: Diagnosis present

## 2013-02-12 DIAGNOSIS — M7918 Myalgia, other site: Secondary | ICD-10-CM

## 2013-02-12 DIAGNOSIS — E785 Hyperlipidemia, unspecified: Secondary | ICD-10-CM | POA: Diagnosis present

## 2013-02-12 DIAGNOSIS — D649 Anemia, unspecified: Secondary | ICD-10-CM | POA: Diagnosis present

## 2013-02-12 DIAGNOSIS — T426X1A Poisoning by other antiepileptic and sedative-hypnotic drugs, accidental (unintentional), initial encounter: Secondary | ICD-10-CM | POA: Diagnosis present

## 2013-02-12 DIAGNOSIS — Z8742 Personal history of other diseases of the female genital tract: Secondary | ICD-10-CM

## 2013-02-12 DIAGNOSIS — G92 Toxic encephalopathy: Secondary | ICD-10-CM | POA: Diagnosis present

## 2013-02-12 DIAGNOSIS — T394X2A Poisoning by antirheumatics, not elsewhere classified, intentional self-harm, initial encounter: Secondary | ICD-10-CM | POA: Diagnosis present

## 2013-02-12 DIAGNOSIS — M47816 Spondylosis without myelopathy or radiculopathy, lumbar region: Secondary | ICD-10-CM

## 2013-02-12 LAB — CBC WITH DIFFERENTIAL/PLATELET
Basophils Absolute: 0 10*3/uL (ref 0.0–0.1)
HCT: 35.1 % — ABNORMAL LOW (ref 36.0–46.0)
Hemoglobin: 10.9 g/dL — ABNORMAL LOW (ref 12.0–15.0)
Lymphocytes Relative: 32 % (ref 12–46)
Lymphs Abs: 2.4 10*3/uL (ref 0.7–4.0)
Monocytes Absolute: 0.7 10*3/uL (ref 0.1–1.0)
Monocytes Relative: 9 % (ref 3–12)
Neutro Abs: 4.2 10*3/uL (ref 1.7–7.7)
Neutrophils Relative %: 57 % (ref 43–77)
RDW: 15.9 % — ABNORMAL HIGH (ref 11.5–15.5)
WBC: 7.3 10*3/uL (ref 4.0–10.5)

## 2013-02-12 LAB — GLUCOSE, CAPILLARY

## 2013-02-12 LAB — COMPREHENSIVE METABOLIC PANEL
AST: 20 U/L (ref 0–37)
Alkaline Phosphatase: 52 U/L (ref 39–117)
BUN: 9 mg/dL (ref 6–23)
CO2: 25 mEq/L (ref 19–32)
Chloride: 103 mEq/L (ref 96–112)
Creatinine, Ser: 0.72 mg/dL (ref 0.50–1.10)
GFR calc non Af Amer: >90 mL/min (ref >90)
Potassium: 4.3 mEq/L (ref 3.5–5.1)
Total Bilirubin: 0.1 mg/dL — ABNORMAL LOW (ref 0.3–1.2)

## 2013-02-12 LAB — ETHANOL: Alcohol, Ethyl (B): <11 mg/dL (ref 0–11)

## 2013-02-12 LAB — ACETAMINOPHEN LEVEL: Acetaminophen (Tylenol), Serum: <15 ug/mL (ref 10–30)

## 2013-02-12 LAB — SALICYLATE LEVEL: Salicylate Lvl: <2 mg/dL — ABNORMAL LOW (ref 2.8–20.0)

## 2013-02-12 MED ORDER — ROCURONIUM BROMIDE 50 MG/5ML IV SOLN
1.0000 mg/kg | Freq: Once | INTRAVENOUS | Status: DC
  Administered 2013-02-12: 150 mg via INTRAVENOUS

## 2013-02-12 MED ORDER — LIDOCAINE HCL (CARDIAC) 20 MG/ML IV SOLN
INTRAVENOUS | Status: AC
  Filled 2013-02-12: qty 5

## 2013-02-12 MED ORDER — SUCCINYLCHOLINE CHLORIDE 20 MG/ML IJ SOLN
INTRAMUSCULAR | Status: AC
  Filled 2013-02-12: qty 1

## 2013-02-12 MED ORDER — ETOMIDATE 2 MG/ML IV SOLN
INTRAVENOUS | Status: AC
  Filled 2013-02-12: qty 20

## 2013-02-12 MED ORDER — ROCURONIUM BROMIDE 50 MG/5ML IV SOLN
INTRAVENOUS | Status: AC
  Filled 2013-02-12: qty 2

## 2013-02-12 MED ORDER — ROCURONIUM BROMIDE 50 MG/5ML IV SOLN
INTRAVENOUS | Status: AC
  Administered 2013-02-12: 150 mg via INTRAVENOUS
  Filled 2013-02-12: qty 2

## 2013-02-12 MED ORDER — PROPOFOL 10 MG/ML IV EMUL
5.0000 ug/kg/min | Freq: Once | INTRAVENOUS | Status: DC
  Administered 2013-02-12: 5 ug/kg/min via INTRAVENOUS

## 2013-02-12 MED ORDER — NALOXONE HCL 1 MG/ML IJ SOLN
2.0000 mg | Freq: Once | INTRAMUSCULAR | Status: AC
  Administered 2013-02-12: 2 mg via INTRAVENOUS

## 2013-02-12 MED ORDER — ETOMIDATE 2 MG/ML IV SOLN
0.3000 mg/kg | Freq: Once | INTRAVENOUS | Status: AC
  Administered 2013-02-12: 30 mg via INTRAVENOUS

## 2013-02-12 MED ORDER — PROPOFOL 10 MG/ML IV EMUL
INTRAVENOUS | Status: AC
  Filled 2013-02-12: qty 100

## 2013-02-12 MED ORDER — NALOXONE HCL 1 MG/ML IJ SOLN
INTRAMUSCULAR | Status: AC
  Filled 2013-02-12: qty 2

## 2013-02-12 NOTE — ED Notes (Signed)
No change after narcan, plan of care to intubate

## 2013-02-12 NOTE — ED Notes (Signed)
Pt intubated by Dr. Daria Pastures with Dr. Wilkie Aye and Shanda Bumps RT at bedside.

## 2013-02-12 NOTE — ED Notes (Signed)
Dr. Wilkie Aye made aware of GCS of 7. EDP at bedside

## 2013-02-12 NOTE — ED Provider Notes (Signed)
CSN: [id number]     Arrival date & time 02/12/13  2228 History   First MD Initiated Contact with Patient 02/12/13 2253     Chief Complaint  Patient presents with  . Drug Overdose  . Seizures  . Fall   HPI  Pt presents via EMS for altered mental status. Per EMS, the patient was called out by the family after the patient was found unconscious on the ground. Last known normal time was 16:30 per EMS. The patient was found down next to her bed on the ground. She had blood in her nares. She was found without 21 xanax, 12 Ambien and 30 oxycodone. While in route with EMS the patient had a GTC lasting approximately 2 minutes. History and ROS limited secondary to the condition of the patient.   Past Medical History  Diagnosis Date  . GERD (gastroesophageal reflux disease)   . Gastroparesis   . Hypertension   . Restless legs   . H/O echocardiogram     a. 02/2010: done for htn/tachy while pregnant - EF 55-60%, grade 1 d/dysf, mildly dilated LA.  . High cholesterol   . Atrial fibrillation with RVR     a. Dx 09/2012, spont conv to NSR.  Marland Kitchen Sleep apnea   . Gestational diabetes   . Migraines   . Chronic lower back pain   . Anxiety   . Kidney stones   . Anemia     a. Mild with Hgb 11.2, 09/2012. UA with mod Hgb. Instructed to f/u PCP.   Past Surgical History  Procedure Laterality Date  . Tubal ligation  2012  . Cesarean section  2012  . Dilation and curettage of uterus  2006; 2010  . Cystoscopy/retrograde/ureteroscopy/stone extraction with basket  1987  . Cystoscopy w/ ureteral stent placement  2006   Family History  Problem Relation Age of Onset  . Diabetes Mother   . Hypertension Mother    History  Substance Use Topics  . Smoking status: Never Smoker   . Smokeless tobacco: Never Used  . Alcohol Use: No   OB History   Grav Para Term Preterm Abortions TAB SAB Ect Mult Living                 Review of Systems  Unable to perform ROS: Acuity of condition   Allergies  Iodides;  Imitrex; and Metoclopramide hcl  Home Medications   No current outpatient prescriptions on file. BP 120/82  Pulse 103  Temp(Src) 99.5 F (37.5 C) (Oral)  Resp 19  Ht 5\' 8"  (1.727 m)  Wt 218 lb 0.6 oz (98.9 kg)  BMI 33.16 kg/m2  SpO2 98% Physical Exam  Constitutional: She appears well-developed.  HENT:  Head: Normocephalic.  Mouth/Throat: No oropharyngeal exudate.  Dried blood in bilateral nares  Eyes: Conjunctivae are normal.  6 mm and equally reactive bilaterally  Neck: Neck supple.  Cardiovascular: Normal rate and regular rhythm.  Exam reveals no gallop and no friction rub.   No murmur heard. Pulmonary/Chest: Breath sounds normal.  Abdominal: Soft. She exhibits no distension. There is no tenderness.  Musculoskeletal: Normal range of motion.  Extremities are grossly atraumatic  Neurological: She is unresponsive. GCS eye subscore is 1. GCS verbal subscore is 1. GCS motor subscore is 4.  Localizes to pain. Unable to formally test strength or sensation. Does withdrawal all extremities to noxious stimuli.   Skin: Skin is warm and dry.    ED Course  INTUBATION Date/Time: 02/12/2013 11:26 PM Performed by:  Shanon Ace Authorized by: Shanon Ace Consent: The procedure was performed in an emergent situation. Indications: airway protection Intubation method: direct Patient status: paralyzed (RSI) Sedatives: etomidate Paralytic: rocuronium Laryngoscope size: Mac 4 Tube size: 7.5 mm Tube type: cuffed Number of attempts: 1 Cricoid pressure: yes Cords visualized: yes Post-procedure assessment: chest rise and ETCO2 monitor Breath sounds: equal Cuff inflated: yes ETT to lip: 22 cm ETT to teeth: 21 cm Tube secured with: ETT holder Chest x-ray interpreted by me. Chest x-ray findings: endotracheal tube in appropriate position Patient tolerance: Patient tolerated the procedure well with no immediate complications.   (including critical care time) Labs  Review Labs Reviewed  CBC WITH DIFFERENTIAL - Abnormal; Notable for the following:    Hemoglobin 10.9 (*)    HCT 35.1 (*)    RDW 15.9 (*)    All other components within normal limits  COMPREHENSIVE METABOLIC PANEL - Abnormal; Notable for the following:    Glucose, Bld 100 (*)    Total Bilirubin 0.1 (*)    All other components within normal limits  URINE RAPID DRUG SCREEN (HOSP PERFORMED) - Abnormal; Notable for the following:    Barbiturates POSITIVE (*)    All other components within normal limits  SALICYLATE LEVEL - Abnormal; Notable for the following:    Salicylate Lvl <2.0 (*)    All other components within normal limits  GLUCOSE, CAPILLARY - Abnormal; Notable for the following:    Glucose-Capillary 121 (*)    All other components within normal limits  TRIGLYCERIDES - Abnormal; Notable for the following:    Triglycerides 573 (*)    All other components within normal limits  BLOOD GAS, ARTERIAL - Abnormal; Notable for the following:    pO2, Arterial 228.0 (*)    All other components within normal limits  COMPREHENSIVE METABOLIC PANEL - Abnormal; Notable for the following:    Sodium 135 (*)    Glucose, Bld 122 (*)    All other components within normal limits  POCT I-STAT 3, BLOOD GAS (G3+) - Abnormal; Notable for the following:    pH, Arterial 7.248 (*)    pCO2 arterial 55.6 (*)    Bicarbonate 24.3 (*)    Acid-base deficit 4.0 (*)    All other components within normal limits  CULTURE, RESPIRATORY (NON-EXPECTORATED)  MRSA PCR SCREENING  CULTURE, BLOOD (ROUTINE X 2)  CULTURE, BLOOD (ROUTINE X 2)  ACETAMINOPHEN LEVEL  ETHANOL  URINALYSIS, ROUTINE W REFLEX MICROSCOPIC  MAGNESIUM  PHOSPHORUS  PROTIME-INR  APTT  LACTIC ACID, PLASMA  PROCALCITONIN  TROPONIN I  TROPONIN I  TROPONIN I   Imaging Review Ct Head Wo Contrast  02/13/2013   CLINICAL DATA:  Patient has children found her on conscious on the ground. Patient found only response to physical stimuli.  EXAM: CT  HEAD WITHOUT CONTRAST  CT CERVICAL SPINE WITHOUT CONTRAST  TECHNIQUE: Multidetector CT imaging of the head and cervical spine was performed following the standard protocol without intravenous contrast. Multiplanar CT image reconstructions of the cervical spine were also generated.  COMPARISON:  None.  FINDINGS: CT HEAD FINDINGS  There is no evidence of mass effect, midline shift or extra-axial fluid collections. There is no evidence of a space-occupying lesion or intracranial hemorrhage. There is no evidence of a cortical-based area of acute infarction.  The ventricles and sulci are appropriate for the patient's age. The basal cisterns are patent.  Visualized portions of the orbits are unremarkable. The visualized portions of the paranasal sinuses and mastoid air cells  are unremarkable.  The osseous structures are unremarkable. There is endotracheal tube present.  CT CERVICAL SPINE FINDINGS  The alignment is anatomic. The vertebral body heights are maintained. There is no acute fracture. There is no static listhesis. The prevertebral soft tissues are normal. The intraspinal soft tissues are not fully imaged on this examination due to poor soft tissue contrast, but there is no gross soft tissue abnormality.  The disc spaces are maintained.  There is biapical airspace disease which may reflect aspiration pneumonia.  IMPRESSION: 1.  No acute intracranial pathology.  2.  Biapical airspace disease concerning for aspiration pneumonia.  3.  No acute osseous injury of the cervical spine.   Electronically Signed   By: Elige Ko   On: 02/13/2013 00:31   Ct Cervical Spine Wo Contrast  02/13/2013   CLINICAL DATA:  Patient has children found her on conscious on the ground. Patient found only response to physical stimuli.  EXAM: CT HEAD WITHOUT CONTRAST  CT CERVICAL SPINE WITHOUT CONTRAST  TECHNIQUE: Multidetector CT imaging of the head and cervical spine was performed following the standard protocol without intravenous  contrast. Multiplanar CT image reconstructions of the cervical spine were also generated.  COMPARISON:  None.  FINDINGS: CT HEAD FINDINGS  There is no evidence of mass effect, midline shift or extra-axial fluid collections. There is no evidence of a space-occupying lesion or intracranial hemorrhage. There is no evidence of a cortical-based area of acute infarction.  The ventricles and sulci are appropriate for the patient's age. The basal cisterns are patent.  Visualized portions of the orbits are unremarkable. The visualized portions of the paranasal sinuses and mastoid air cells are unremarkable.  The osseous structures are unremarkable. There is endotracheal tube present.  CT CERVICAL SPINE FINDINGS  The alignment is anatomic. The vertebral body heights are maintained. There is no acute fracture. There is no static listhesis. The prevertebral soft tissues are normal. The intraspinal soft tissues are not fully imaged on this examination due to poor soft tissue contrast, but there is no gross soft tissue abnormality.  The disc spaces are maintained.  There is biapical airspace disease which may reflect aspiration pneumonia.  IMPRESSION: 1.  No acute intracranial pathology.  2.  Biapical airspace disease concerning for aspiration pneumonia.  3.  No acute osseous injury of the cervical spine.   Electronically Signed   By: Elige Ko   On: 02/13/2013 00:31   Dg Chest Port 1 View  02/13/2013   CLINICAL DATA:  Follow up aspiration pneumonia  EXAM: PORTABLE CHEST - 1 VIEW  COMPARISON:  02/12/2013  FINDINGS: There is an endotracheal tube with the tip 3 cm above the carina. There is a nasogastric tube within the stomach. There is left lower lobe hazy airspace disease which may reflect atelectasis versus pneumonia including aspiration pneumonia. There is no pleural effusion or pneumothorax. Stable cardiomediastinal silhouette. The osseous structures are unremarkable.  IMPRESSION: No significant interval change  compared with the prior exam. Persistent hazy left lower lobe airspace disease.   Electronically Signed   By: Elige Ko   On: 02/13/2013 01:40   Dg Chest Portable 1 View  02/12/2013   CLINICAL DATA:  Drug overdose, seizures  EXAM: PORTABLE CHEST - 1 VIEW  COMPARISON:  10/03/2012  FINDINGS: There is an endotracheal tube with the tip 2.9 cm above the carina. There is a nasogastric tube coursing below the diaphragm with the tip excluded from the field of view.  There are low  lung volumes with crowding of the interstitial markings. There is left lower lobe airspace disease which may reflect atelectasis versus pneumonia. The heart and mediastinal contours are unremarkable.  The osseous structures are unremarkable.  IMPRESSION: 1. Endotracheal tube with the tip 2.9 cm above the carina. 2. Low lung volumes with crowding of the interstitial markings. There is more focal left lower lobe airspace disease concerning for atelectasis versus pneumonia including aspiration pneumonia.   Electronically Signed   By: Elige Ko   On: 02/12/2013 23:54    EKG Interpretation    Date/Time:    Ventricular Rate:    PR Interval:    QRS Duration:   QT Interval:    QTC Calculation:   R Axis:     Text Interpretation:             MDM   1. Acute encephalopathy   2. Acute respiratory failure   3. Aspiration pneumonia     Likely overdose. Intubated for airway protection. EKG without acute ischemic changes. No prolonged QTc. Normal QRS duration. CT head without acute abnormality. CT C-spine normal. CXR with concern for aspiration. Without fever or leukocytosis likely aspiration pneumonitis and will defer antibiotics at this time. Likely polypharmacy overdose. ABG came back with respiratory acidosis. Increased RR to 18. The patient was admitted to the ICU in HDS condition.    Shanon Ace, MD 02/13/13 [phone]

## 2013-02-12 NOTE — Progress Notes (Signed)
Pt intubated by physician with no complications. 7.5 ETT, 22@ lips, BBS heard, good color change with ETCO2. X-ray pending.

## 2013-02-12 NOTE — ED Notes (Signed)
Pt's CBG 121. Verdene Lennert RN notified

## 2013-02-12 NOTE — ED Notes (Signed)
No response with Narcan. EDP states to prepare for intubation. RSI kit pulled, Respiratory at bedside

## 2013-02-12 NOTE — ED Notes (Addendum)
EMS-EMS called out by family after the patients children found her unconscious on the ground. Pt was found only responsive to physical stimuli. xanax 21, 12 ambien, and 30 oxy missing. Pt with witnessed seizure by EMS with no hx. 20g(L)hand. 97 cbg. nrb placed in route.

## 2013-02-12 NOTE — ED Notes (Signed)
Nasal trumpet to let nare, tolerated well

## 2013-02-12 NOTE — ED Notes (Signed)
EDP and RES at bedside

## 2013-02-13 ENCOUNTER — Inpatient Hospital Stay (HOSPITAL_COMMUNITY): Payer: Medicaid Other

## 2013-02-13 ENCOUNTER — Encounter (HOSPITAL_COMMUNITY): Payer: Self-pay | Admitting: Radiology

## 2013-02-13 ENCOUNTER — Emergency Department (HOSPITAL_COMMUNITY): Payer: Medicaid Other

## 2013-02-13 ENCOUNTER — Other Ambulatory Visit (HOSPITAL_COMMUNITY): Payer: Medicaid Other

## 2013-02-13 DIAGNOSIS — J9600 Acute respiratory failure, unspecified whether with hypoxia or hypercapnia: Secondary | ICD-10-CM

## 2013-02-13 DIAGNOSIS — J690 Pneumonitis due to inhalation of food and vomit: Secondary | ICD-10-CM | POA: Diagnosis present

## 2013-02-13 DIAGNOSIS — G9340 Encephalopathy, unspecified: Secondary | ICD-10-CM

## 2013-02-13 LAB — COMPREHENSIVE METABOLIC PANEL
ALT: 12 U/L (ref 0–35)
Alkaline Phosphatase: 53 U/L (ref 39–117)
BUN: 8 mg/dL (ref 6–23)
CO2: 25 mEq/L (ref 19–32)
Chloride: 98 mEq/L (ref 96–112)
GFR calc Af Amer: >90 mL/min (ref >90)
GFR calc non Af Amer: >90 mL/min (ref >90)
Glucose, Bld: 122 mg/dL — ABNORMAL HIGH (ref 70–99)
Potassium: 4.5 mEq/L (ref 3.7–5.3)
Sodium: 135 mEq/L — ABNORMAL LOW (ref 137–147)
Total Protein: 7.4 g/dL (ref 6.0–8.3)

## 2013-02-13 LAB — MRSA PCR SCREENING: MRSA by PCR: NEGATIVE

## 2013-02-13 LAB — PROTIME-INR: Prothrombin Time: 13.5 seconds (ref 11.6–15.2)

## 2013-02-13 LAB — POCT I-STAT 3, ART BLOOD GAS (G3+)
Acid-base deficit: 4 mmol/L — ABNORMAL HIGH (ref 0.0–2.0)
Bicarbonate: 24.3 mEq/L — ABNORMAL HIGH (ref 20.0–24.0)
Patient temperature: 98.6
TCO2: 26 mmol/L (ref 0–100)
pCO2 arterial: 55.6 mmHg — ABNORMAL HIGH (ref 35.0–45.0)
pH, Arterial: 7.248 — ABNORMAL LOW (ref 7.350–7.450)
pO2, Arterial: 95 mmHg (ref 80.0–100.0)

## 2013-02-13 LAB — RAPID URINE DRUG SCREEN, HOSP PERFORMED
Barbiturates: POSITIVE — AB
Cocaine: NOT DETECTED
Tetrahydrocannabinol: NOT DETECTED

## 2013-02-13 LAB — TRIGLYCERIDES: Triglycerides: 573 mg/dL — ABNORMAL HIGH (ref <150)

## 2013-02-13 LAB — URINALYSIS, ROUTINE W REFLEX MICROSCOPIC
Bilirubin Urine: NEGATIVE
Hgb urine dipstick: NEGATIVE
Ketones, ur: NEGATIVE mg/dL
Leukocytes, UA: NEGATIVE
Nitrite: NEGATIVE
Protein, ur: NEGATIVE mg/dL
Specific Gravity, Urine: 1.014 (ref 1.005–1.030)
Urobilinogen, UA: 0.2 mg/dL (ref 0.0–1.0)
pH: 8 (ref 5.0–8.0)

## 2013-02-13 LAB — APTT: aPTT: 26 seconds (ref 24–37)

## 2013-02-13 LAB — LACTIC ACID, PLASMA: Lactic Acid, Venous: 1.8 mmol/L (ref 0.5–2.2)

## 2013-02-13 LAB — BLOOD GAS, ARTERIAL
Bicarbonate: 22.8 mEq/L (ref 20.0–24.0)
TCO2: 24.1 mmol/L (ref 0–100)
pCO2 arterial: 42 mmHg (ref 35.0–45.0)
pH, Arterial: 7.354 (ref 7.350–7.450)
pO2, Arterial: 228 mmHg — ABNORMAL HIGH (ref 80.0–100.0)

## 2013-02-13 LAB — PROCALCITONIN: Procalcitonin: <0.1 ng/mL

## 2013-02-13 LAB — TROPONIN I: Troponin I: <0.3 ng/mL (ref <0.30)

## 2013-02-13 MED ORDER — PIPERACILLIN-TAZOBACTAM 3.375 G IVPB 30 MIN
3.3750 g | Freq: Once | INTRAVENOUS | Status: AC
  Administered 2013-02-13: 3.375 g via INTRAVENOUS

## 2013-02-13 MED ORDER — PANTOPRAZOLE SODIUM 40 MG IV SOLR
40.0000 mg | INTRAVENOUS | Status: DC
  Administered 2013-02-13 – 2013-02-14 (×2): 40 mg via INTRAVENOUS
  Filled 2013-02-13 (×4): qty 40

## 2013-02-13 MED ORDER — PROCHLORPERAZINE EDISYLATE 5 MG/ML IJ SOLN
10.0000 mg | Freq: Four times a day (QID) | INTRAMUSCULAR | Status: DC | PRN
  Administered 2013-02-13: 10 mg via INTRAVENOUS
  Filled 2013-02-13: qty 2

## 2013-02-13 MED ORDER — MIDAZOLAM HCL 2 MG/2ML IJ SOLN: 2.0000 mg | INTRAMUSCULAR | Status: DC | PRN

## 2013-02-13 MED ORDER — ACETAMINOPHEN 325 MG PO TABS
650.0000 mg | ORAL_TABLET | ORAL | Status: DC | PRN
Start: 2013-02-13 — End: 2013-02-14
  Administered 2013-02-13 – 2013-02-14 (×3): 650 mg via ORAL
  Filled 2013-02-13 (×3): qty 2

## 2013-02-13 MED ORDER — BIOTENE DRY MOUTH MT LIQD
15.0000 mL | Freq: Four times a day (QID) | OROMUCOSAL | Status: DC
  Administered 2013-02-13: 15 mL via OROMUCOSAL

## 2013-02-13 MED ORDER — FENTANYL CITRATE 0.05 MG/ML IJ SOLN
100.0000 ug | INTRAMUSCULAR | Status: DC | PRN
  Administered 2013-02-13 (×2): 100 ug via INTRAVENOUS
  Filled 2013-02-13: qty 2

## 2013-02-13 MED ORDER — CHLORHEXIDINE GLUCONATE 0.12 % MT SOLN
15.0000 mL | Freq: Two times a day (BID) | OROMUCOSAL | Status: DC
  Administered 2013-02-13 (×2): 15 mL via OROMUCOSAL
  Filled 2013-02-13 (×2): qty 15

## 2013-02-13 MED ORDER — LABETALOL HCL 5 MG/ML IV SOLN: 10.0000 mg | INTRAVENOUS | Status: DC | PRN

## 2013-02-13 MED ORDER — ALBUTEROL SULFATE (2.5 MG/3ML) 0.083% IN NEBU: 2.5000 mg | INHALATION_SOLUTION | RESPIRATORY_TRACT | Status: DC | PRN

## 2013-02-13 MED ORDER — PIPERACILLIN-TAZOBACTAM 3.375 G IVPB
3.3750 g | Freq: Three times a day (TID) | INTRAVENOUS | Status: DC
  Administered 2013-02-13 – 2013-02-14 (×4): 3.375 g via INTRAVENOUS
  Filled 2013-02-13 (×7): qty 50

## 2013-02-13 MED ORDER — SODIUM CHLORIDE 0.9 % IV SOLN
INTRAVENOUS | Status: DC
  Administered 2013-02-13: 02:00:00 via INTRAVENOUS

## 2013-02-13 MED ORDER — PROPOFOL 10 MG/ML IV EMUL
0.0000 ug/kg/min | INTRAVENOUS | Status: DC
  Administered 2013-02-13: 50 ug/kg/min via INTRAVENOUS
  Administered 2013-02-13: 35 ug/kg/min via INTRAVENOUS
  Administered 2013-02-13: 40 ug/kg/min via INTRAVENOUS
  Filled 2013-02-13 (×2): qty 100

## 2013-02-13 MED ORDER — HEPARIN SODIUM (PORCINE) 5000 UNIT/ML IJ SOLN
5000.0000 [IU] | Freq: Three times a day (TID) | INTRAMUSCULAR | Status: DC
  Administered 2013-02-13 – 2013-02-14 (×5): 5000 [IU] via SUBCUTANEOUS
  Filled 2013-02-13 (×8): qty 1

## 2013-02-13 NOTE — Procedures (Signed)
History: 42 year old female with altered mental status in the setting of likely drug overdose, generalized seizure en route  Background: The background has generalized high-voltage delta and theta activities throughout much of the recording. There are occasional runs of frontally predominant, intermittent, rhythmic delta activity (FIRDA). There are  faster frequencies present as well with a visualized posterior dominant rhythm of 12 Hz that is poorly sustained.  Photic stimulation: Physiologic driving is present  EEG Abnormalities: 1) generalized slow activity  Clinical Interpretation: This EEG is consistent with generalized nonspecific cerebral dysfunction (encephalopathy). There was no seizure or seizure predisposition recorded on this study.   Ritta Slot, MD [facility] [phone]  If 7pm- 7am, please page neurology on call at [phone].

## 2013-02-13 NOTE — Progress Notes (Signed)
Name: Sandra [name] MRN: [id number] DOB: March 06, 1970    ADMISSION DATE:  02/12/2013 CONSULTATION DATE:  02/13/2013  REFERRING MD : Ethelene Browns  CHIEF COMPLAINT:  Acute encephalopathy  BRIEF PATIENT DESCRIPTION:  42 yo female with hx of chronic pain c/o worsening leg pain.  Went to bed.  Family found pt unresponsive on floor, likely from drug overdose.  She was intubated in ER, and PCCM asked to admit.\ She was noted to have generalized tonic/clonic seizure en route to ED.   SIGNIFICANT EVENTS: 12/30 Admit  STUDIES:  12/30 CT head >> no intracranial pathology 12/30 CT neck >> No acute osseous injury of the cervical spine.  LINES / TUBES: PIV  CULTURES: Blood 12/30 >> Sputum 12/30 >>  ANTIBIOTICS: Zosyn 12/30 >>    SUBJECTIVE: lethargic, no obvious pain or dyspnea afebrile  VITAL SIGNS: Temp:  [98.5 F (36.9 C)-99.3 F (37.4 C)] 99.3 F (37.4 C) (12/30 0400) Pulse Rate:  [80-105] 99 (12/30 0724) Resp:  [14-28] 28 (12/30 0724) BP: (102-179)/(78-124) 139/84 mmHg (12/30 0724) SpO2:  [93 %-100 %] 100 % (12/30 0724) FiO2 (%):  [40 %-80 %] 40 % (12/30 0811) Weight:  [98.9 kg (218 lb 0.6 oz)-100 kg (220 lb 7.4 oz)] 98.9 kg (218 lb 0.6 oz) (12/30 0200) HEMODYNAMICS:   VENTILATOR SETTINGS: Vent Mode:  [-] PSV FiO2 (%):  [40 %-80 %] 40 % Set Rate:  [14 bmp-18 bmp] 18 bmp Vt Set:  [510 mL] 510 mL PEEP:  [5 cmH20] 5 cmH20 Pressure Support:  [5 cmH20] 5 cmH20 Plateau Pressure:  [17 cmH20-22 cmH20] 17 cmH20 INTAKE / OUTPUT: Intake/Output     12/29 0701 - 12/30 0700 12/30 0701 - 12/31 0700   I.V. (mL/kg) 496.3 (5)    IV Piggyback 50    Total Intake(mL/kg) 546.3 (5.5)    Urine (mL/kg/hr) 1535    Total Output 1535     Net -988.7            PHYSICAL EXAMINATION: General: ill appearing Neuro: sedation off HEENT:  Pupils dilated/reactive, ETT in place Cardiovascular:  Tachycardic, regular Lungs:  B/l rales Abdomen:  Soft, non tender, decreased bowel  sounds Musculoskeletal:  No edema Skin:  No rashes  LABS:  CBC  Recent Labs Lab 02/12/13 2243  WBC 7.3  HGB 10.9*  HCT 35.1*  PLT 204   Coag's  Recent Labs Lab 02/13/13 0345  APTT 26  INR 1.05   BMET  Recent Labs Lab 02/12/13 2243 02/13/13 0345  NA 139 135*  K 4.3 4.5  CL 103 98  CO2 25 25  BUN 9 8  CREATININE 0.72 0.73  GLUCOSE 100* 122*   Electrolytes  Recent Labs Lab 02/12/13 2243 02/13/13 0345  CALCIUM 8.7 8.5  MG  --  1.7  PHOS  --  3.1   Sepsis Markers  Recent Labs Lab 02/13/13 0111 02/13/13 0345  LATICACIDVEN 1.8  --   PROCALCITON  --  <0.10   ABG  Recent Labs Lab 02/13/13 0004 02/13/13 0343  PHART 7.248* 7.354  PCO2ART 55.6* 42.0  PO2ART 95.0 228.0*   Liver Enzymes  Recent Labs Lab 02/12/13 2243 02/13/13 0345  AST 20 13  ALT 13 12  ALKPHOS 52 53  BILITOT 0.1* 0.3  ALBUMIN 3.5 3.6   Cardiac Enzymes  Recent Labs Lab 02/13/13 0345 02/13/13 0750  TROPONINI <0.30 <0.30   Glucose  Recent Labs Lab 02/12/13 2347  GLUCAP 121*    Imaging Ct Head Wo Contrast  02/13/2013   CLINICAL DATA:  Patient has children found her on conscious on the ground. Patient found only response to physical stimuli.  EXAM: CT HEAD WITHOUT CONTRAST  CT CERVICAL SPINE WITHOUT CONTRAST  TECHNIQUE: Multidetector CT imaging of the head and cervical spine was performed following the standard protocol without intravenous contrast. Multiplanar CT image reconstructions of the cervical spine were also generated.  COMPARISON:  None.  FINDINGS: CT HEAD FINDINGS  There is no evidence of mass effect, midline shift or extra-axial fluid collections. There is no evidence of a space-occupying lesion or intracranial hemorrhage. There is no evidence of a cortical-based area of acute infarction.  The ventricles and sulci are appropriate for the patient's age. The basal cisterns are patent.  Visualized portions of the orbits are unremarkable. The visualized portions  of the paranasal sinuses and mastoid air cells are unremarkable.  The osseous structures are unremarkable. There is endotracheal tube present.  CT CERVICAL SPINE FINDINGS  The alignment is anatomic. The vertebral body heights are maintained. There is no acute fracture. There is no static listhesis. The prevertebral soft tissues are normal. The intraspinal soft tissues are not fully imaged on this examination due to poor soft tissue contrast, but there is no gross soft tissue abnormality.  The disc spaces are maintained.  There is biapical airspace disease which may reflect aspiration pneumonia.  IMPRESSION: 1.  No acute intracranial pathology.  2.  Biapical airspace disease concerning for aspiration pneumonia.  3.  No acute osseous injury of the cervical spine.   Electronically Signed   By: Elige Ko   On: 02/13/2013 00:31   Ct Cervical Spine Wo Contrast  02/13/2013   CLINICAL DATA:  Patient has children found her on conscious on the ground. Patient found only response to physical stimuli.  EXAM: CT HEAD WITHOUT CONTRAST  CT CERVICAL SPINE WITHOUT CONTRAST  TECHNIQUE: Multidetector CT imaging of the head and cervical spine was performed following the standard protocol without intravenous contrast. Multiplanar CT image reconstructions of the cervical spine were also generated.  COMPARISON:  None.  FINDINGS: CT HEAD FINDINGS  There is no evidence of mass effect, midline shift or extra-axial fluid collections. There is no evidence of a space-occupying lesion or intracranial hemorrhage. There is no evidence of a cortical-based area of acute infarction.  The ventricles and sulci are appropriate for the patient's age. The basal cisterns are patent.  Visualized portions of the orbits are unremarkable. The visualized portions of the paranasal sinuses and mastoid air cells are unremarkable.  The osseous structures are unremarkable. There is endotracheal tube present.  CT CERVICAL SPINE FINDINGS  The alignment is  anatomic. The vertebral body heights are maintained. There is no acute fracture. There is no static listhesis. The prevertebral soft tissues are normal. The intraspinal soft tissues are not fully imaged on this examination due to poor soft tissue contrast, but there is no gross soft tissue abnormality.  The disc spaces are maintained.  There is biapical airspace disease which may reflect aspiration pneumonia.  IMPRESSION: 1.  No acute intracranial pathology.  2.  Biapical airspace disease concerning for aspiration pneumonia.  3.  No acute osseous injury of the cervical spine.   Electronically Signed   By: Elige Ko   On: 02/13/2013 00:31   Dg Chest Port 1 View  02/13/2013   CLINICAL DATA:  Follow up aspiration pneumonia  EXAM: PORTABLE CHEST - 1 VIEW  COMPARISON:  02/12/2013  FINDINGS: There is an endotracheal tube  with the tip 3 cm above the carina. There is a nasogastric tube within the stomach. There is left lower lobe hazy airspace disease which may reflect atelectasis versus pneumonia including aspiration pneumonia. There is no pleural effusion or pneumothorax. Stable cardiomediastinal silhouette. The osseous structures are unremarkable.  IMPRESSION: No significant interval change compared with the prior exam. Persistent hazy left lower lobe airspace disease.   Electronically Signed   By: Elige Ko   On: 02/13/2013 01:40   Dg Chest Portable 1 View  02/12/2013   CLINICAL DATA:  Drug overdose, seizures  EXAM: PORTABLE CHEST - 1 VIEW  COMPARISON:  10/03/2012  FINDINGS: There is an endotracheal tube with the tip 2.9 cm above the carina. There is a nasogastric tube coursing below the diaphragm with the tip excluded from the field of view.  There are low lung volumes with crowding of the interstitial markings. There is left lower lobe airspace disease which may reflect atelectasis versus pneumonia. The heart and mediastinal contours are unremarkable.  The osseous structures are unremarkable.   IMPRESSION: 1. Endotracheal tube with the tip 2.9 cm above the carina. 2. Low lung volumes with crowding of the interstitial markings. There is more focal left lower lobe airspace disease concerning for atelectasis versus pneumonia including aspiration pneumonia.   Electronically Signed   By: Elige Ko   On: 02/12/2013 23:54    ASSESSMENT / PLAN:  PULMONARY A: Acute respiratory failure 2nd to acute encephalopathy and aspiration pneumonia P:   -SBTs with goal extubation -prn BD's  CARDIOVASCULAR A:  Hx of HTN, hyperlipidemia. P:  -prn labetalol for SBP > 170 -monitor hemodynamics -hold outpt ASA, lipitor for now - cardiac enzymes neg  RENAL A:   No acute issues.lactate ok P:   -monitor renal fx, urine outpt, electrolytes   GASTROINTESTINAL A:   Nutrition. Hx of gastroparesis, GERD. P:   -protonix for SUP -expect tor esume PO soon  HEMATOLOGIC A:   Anemia - mild. P:  -f/u CBC -SQ heparin for DVT prevention  INFECTIOUS A:   Aspiration pneumonia,-low procalcitonin reassuring P:   -start zosyn -check cultures   ENDOCRINE A:   No issues.   P:   -monitor blood sugar on BMET  NEUROLOGIC A:   Acute encephalopathy likely secondary to drug overdose ?intentional. Reported seizure x one en route to hospital 12/29. Hx of chronic pain, migraines, RLS, anxiety. UDS POS barbiturates (fioricet) P:   -f/u EEG -defer neurology assessment for now -hold outpt xanax, neurotin, topamax, robaxin, percocet   Updated pt's mother at bedside.  The patient is critically ill with multiple organ systems failure and requires high complexity decision making for assessment and support, frequent evaluation and titration of therapies, application of advanced monitoring technologies and extensive interpretation of multiple databases. Critical Care Time devoted to patient care services described in this note is 35 minutes.    Cyril Mourning MD. Tonny Bollman. [facility] Pager [phone] If no response call [phone]    02/13/2013, 9:28 AM

## 2013-02-13 NOTE — Progress Notes (Signed)
UR completed.  Green Quincy, RN BSN [facility] CCM Trauma/Neuro ICU Case Manager [phone]  

## 2013-02-13 NOTE — Progress Notes (Signed)
EEG completed; results pending.    

## 2013-02-13 NOTE — Progress Notes (Signed)
eLink Physician-Brief Progress Note Patient Name: Sandra [name] DOB: [birth year] MRN: [id number]  Date of Service  02/13/2013   HPI/Events of Note   C/o nausea  eICU Interventions  Compazine ordered   Intervention Category Minor Interventions: Routine modifications to care plan (e.g. PRN medications for pain, fever)  Sandra [name] S. 02/13/2013, 6:39 PM

## 2013-02-13 NOTE — Procedures (Signed)
Extubation Procedure Note  Patient Details:   Name: Sandra [name] DOB: 07/24/1970 MRN: [id number]   Airway Documentation:   Pt extubated to 2L [place]. No stridor noted. BBS dim and equal. Pt able to vocalize.  Evaluation  O2 sats: stable throughout and currently acceptable Complications: No apparent complications Patient did tolerate procedure well. Bilateral Breath Sounds: Clear Suctioning: Airway;Oral   Christie Beckers 02/13/2013, 10:04 AM

## 2013-02-13 NOTE — H&P (Signed)
Name: Sandra [name] MRN: [id number] DOB: November 16, 1970    ADMISSION DATE:  02/12/2013 CONSULTATION DATE:  02/13/2013  REFERRING MD : Ethelene Browns  CHIEF COMPLAINT:  Acute encephalopathy  BRIEF PATIENT DESCRIPTION:  42 yo female with hx of chronic pain c/o worsening leg pain.  Went to bed.  Family found pt unresponsive on floor, likely from drug overdose.  She was intubated in ER, and PCCM asked to admit.  SIGNIFICANT EVENTS: 12/30 Admit  STUDIES:  12/30 CT head >> no intracranial pathology 12/30 CT neck >> No acute osseous injury of the cervical spine.  LINES / TUBES: PIV  CULTURES: Blood 12/30 >> Sputum 12/30 >>  ANTIBIOTICS: Zosyn 12/30 >>   HISTORY OF PRESENT ILLNESS:   Hx from family and medical records.  Pt unable to provide hx.  Pt mother reports that [name] c/o leg pain to her children.  She went to her bedroom at about 430 pm.  Her family later found her unresponsive on the floor of her bedroom with blood around her nares.  There was concern she took several doses of xanax, ambien, and oxycodone.  She was noted to have generalized tonic/clonic seizure en route to ED.  She was intubated for airway protection.  PCCM asked to admit.  PAST MEDICAL HISTORY :  Past Medical History  Diagnosis Date  . GERD (gastroesophageal reflux disease)   . Gastroparesis   . Hypertension   . Restless legs   . H/O echocardiogram     a. 02/2010: done for htn/tachy while pregnant - EF 55-60%, grade 1 d/dysf, mildly dilated LA.  . High cholesterol   . Atrial fibrillation with RVR     a. Dx 09/2012, spont conv to NSR.  Marland Kitchen Sleep apnea   . Gestational diabetes   . Migraines   . Chronic lower back pain   . Anxiety   . Kidney stones   . Anemia     a. Mild with Hgb 11.2, 09/2012. UA with mod Hgb. Instructed to f/u PCP.   Past Surgical History  Procedure Laterality Date  . Tubal ligation  2012  . Cesarean section  2012  . Dilation and curettage of uterus  2006; 2010  .  Cystoscopy/retrograde/ureteroscopy/stone extraction with basket  1987  . Cystoscopy w/ ureteral stent placement  2006   Prior to Admission medications   Medication Sig Start Date End Date Taking? Authorizing Provider  albuterol (PROVENTIL HFA;VENTOLIN HFA) 108 (90 BASE) MCG/ACT inhaler Inhale 2 puffs into the lungs every 2 (two) hours as needed for wheezing or shortness of breath (cough). 10/18/11 10/17/12  Hurman Horn, MD  ALPRAZolam Prudy Feeler) 0.5 MG tablet Take 0.5 mg by mouth daily as needed. For anxiety    Historical Provider, MD  aspirin EC 325 MG EC tablet Take 1 tablet (325 mg total) by mouth daily. 10/04/12   Dayna N Dunn, PA-C  atorvastatin (LIPITOR) 10 MG tablet Take 10 mg by mouth daily.    Historical Provider, MD  butalbital-acetaminophen-caffeine (FIORICET WITH CODEINE) 50-325-40-30 MG per capsule Take 1-2 capsules by mouth every 4 (four) hours as needed for headache or migraine.    Historical Provider, MD  dexlansoprazole (DEXILANT) 60 MG capsule Take 60 mg by mouth 2 (two) times daily.    Historical Provider, MD  diphenhydrAMINE (BENADRYL) 25 MG tablet Take 50 mg by mouth 2 (two) times daily.    Historical Provider, MD  gabapentin (NEURONTIN) 800 MG tablet Take 1 tablet (800 mg total) by mouth 4 (  four) times daily. 06/21/11   Ranelle Oyster, MD  ibuprofen (ADVIL,MOTRIN) 200 MG tablet Take 400 mg by mouth 2 (two) times daily as needed. For pain    Historical Provider, MD  labetalol (NORMODYNE) 300 MG tablet Take 600 mg by mouth 2 (two) times daily.     Historical Provider, MD  methocarbamol (ROBAXIN) 750 MG tablet Take 750 mg by mouth every 6 (six) hours as needed (muscle aches).    Historical Provider, MD  oxyCODONE-acetaminophen (PERCOCET/ROXICET) 5-325 MG per tablet Take 1 tablet by mouth every 6 (six) hours as needed. 05/15/12   Clydie Braun Prueter, PA-C  promethazine (PHENERGAN) 25 MG tablet Take 25 mg by mouth every 6 (six) hours as needed. For nausea     Historical Provider, MD    topiramate (TOPAMAX) 50 MG tablet Take 1-2 tablets (50-100 mg total) by mouth at bedtime. 01/18/12   Ranelle Oyster, MD  zolpidem (AMBIEN) 10 MG tablet Take 10 mg by mouth at bedtime.    Historical Provider, MD   Allergies  Allergen Reactions  . Iodides Hives  . Imitrex [Sumatriptan]     Pt states she takes imitrex injection-"sometimes it works and sometimes doesn't"  . Metoclopramide Hcl Anxiety    FAMILY HISTORY:  Family History  Problem Relation Age of Onset  . Diabetes Mother   . Hypertension Mother    SOCIAL HISTORY:  reports that she has never smoked. She has never used smokeless tobacco. She reports that she does not drink alcohol or use illicit drugs.  REVIEW OF SYSTEMS:   Unable to obtain  SUBJECTIVE:   VITAL SIGNS: Pulse Rate:  [80-105] 101 (12/30 0045) Resp:  [14-26] 26 (12/30 0045) BP: (102-179)/(78-124) 158/103 mmHg (12/30 0045) SpO2:  [93 %-100 %] 100 % (12/30 0045) FiO2 (%):  [60 %-80 %] 60 % (12/30 0009) Weight:  [220 lb 7.4 oz (100 kg)] 220 lb 7.4 oz (100 kg) (12/29 2300) HEMODYNAMICS:   VENTILATOR SETTINGS: Vent Mode:  [-] PRVC FiO2 (%):  [60 %-80 %] 60 % Set Rate:  [14 bmp-18 bmp] 18 bmp Vt Set:  [510 mL] 510 mL PEEP:  [5 cmH20] 5 cmH20 INTAKE / OUTPUT: Intake/Output     12/29 0701 - 12/30 0700   Urine (mL/kg/hr) 900   Total Output 900   Net -900         PHYSICAL EXAMINATION: General: ill appearing Neuro: sedated HEENT:  Pupils dilated/reactive, ETT in place Cardiovascular:  Tachycardic, regular Lungs:  B/l rales Abdomen:  Soft, non tender, decreased bowel sounds Musculoskeletal:  No edema Skin:  No rashes  LABS:  CBC  Recent Labs Lab 02/12/13 2243  WBC 7.3  HGB 10.9*  HCT 35.1*  PLT 204   Coag's No results found for this basename: APTT, INR,  in the last 168 hours BMET  Recent Labs Lab 02/12/13 2243  NA 139  K 4.3  CL 103  CO2 25  BUN 9  CREATININE 0.72  GLUCOSE 100*   Electrolytes  Recent Labs Lab  02/12/13 2243  CALCIUM 8.7   Sepsis Markers No results found for this basename: LATICACIDVEN, PROCALCITON, O2SATVEN,  in the last 168 hours ABG  Recent Labs Lab 02/13/13 0004  PHART 7.248*  PCO2ART 55.6*  PO2ART 95.0   Liver Enzymes  Recent Labs Lab 02/12/13 2243  AST 20  ALT 13  ALKPHOS 52  BILITOT 0.1*  ALBUMIN 3.5   Cardiac Enzymes No results found for this basename: TROPONINI, PROBNP,  in the last  168 hours Glucose  Recent Labs Lab 02/12/13 2347  GLUCAP 121*    Imaging Ct Head Wo Contrast  02/13/2013   CLINICAL DATA:  Patient has children found her on conscious on the ground. Patient found only response to physical stimuli.  EXAM: CT HEAD WITHOUT CONTRAST  CT CERVICAL SPINE WITHOUT CONTRAST  TECHNIQUE: Multidetector CT imaging of the head and cervical spine was performed following the standard protocol without intravenous contrast. Multiplanar CT image reconstructions of the cervical spine were also generated.  COMPARISON:  None.  FINDINGS: CT HEAD FINDINGS  There is no evidence of mass effect, midline shift or extra-axial fluid collections. There is no evidence of a space-occupying lesion or intracranial hemorrhage. There is no evidence of a cortical-based area of acute infarction.  The ventricles and sulci are appropriate for the patient's age. The basal cisterns are patent.  Visualized portions of the orbits are unremarkable. The visualized portions of the paranasal sinuses and mastoid air cells are unremarkable.  The osseous structures are unremarkable. There is endotracheal tube present.  CT CERVICAL SPINE FINDINGS  The alignment is anatomic. The vertebral body heights are maintained. There is no acute fracture. There is no static listhesis. The prevertebral soft tissues are normal. The intraspinal soft tissues are not fully imaged on this examination due to poor soft tissue contrast, but there is no gross soft tissue abnormality.  The disc spaces are maintained.   There is biapical airspace disease which may reflect aspiration pneumonia.  IMPRESSION: 1.  No acute intracranial pathology.  2.  Biapical airspace disease concerning for aspiration pneumonia.  3.  No acute osseous injury of the cervical spine.   Electronically Signed   By: Elige Ko   On: 02/13/2013 00:31   Ct Cervical Spine Wo Contrast  02/13/2013   CLINICAL DATA:  Patient has children found her on conscious on the ground. Patient found only response to physical stimuli.  EXAM: CT HEAD WITHOUT CONTRAST  CT CERVICAL SPINE WITHOUT CONTRAST  TECHNIQUE: Multidetector CT imaging of the head and cervical spine was performed following the standard protocol without intravenous contrast. Multiplanar CT image reconstructions of the cervical spine were also generated.  COMPARISON:  None.  FINDINGS: CT HEAD FINDINGS  There is no evidence of mass effect, midline shift or extra-axial fluid collections. There is no evidence of a space-occupying lesion or intracranial hemorrhage. There is no evidence of a cortical-based area of acute infarction.  The ventricles and sulci are appropriate for the patient's age. The basal cisterns are patent.  Visualized portions of the orbits are unremarkable. The visualized portions of the paranasal sinuses and mastoid air cells are unremarkable.  The osseous structures are unremarkable. There is endotracheal tube present.  CT CERVICAL SPINE FINDINGS  The alignment is anatomic. The vertebral body heights are maintained. There is no acute fracture. There is no static listhesis. The prevertebral soft tissues are normal. The intraspinal soft tissues are not fully imaged on this examination due to poor soft tissue contrast, but there is no gross soft tissue abnormality.  The disc spaces are maintained.  There is biapical airspace disease which may reflect aspiration pneumonia.  IMPRESSION: 1.  No acute intracranial pathology.  2.  Biapical airspace disease concerning for aspiration pneumonia.   3.  No acute osseous injury of the cervical spine.   Electronically Signed   By: Elige Ko   On: 02/13/2013 00:31   Dg Chest Portable 1 View  02/12/2013   CLINICAL DATA:  Drug  overdose, seizures  EXAM: PORTABLE CHEST - 1 VIEW  COMPARISON:  10/03/2012  FINDINGS: There is an endotracheal tube with the tip 2.9 cm above the carina. There is a nasogastric tube coursing below the diaphragm with the tip excluded from the field of view.  There are low lung volumes with crowding of the interstitial markings. There is left lower lobe airspace disease which may reflect atelectasis versus pneumonia. The heart and mediastinal contours are unremarkable.  The osseous structures are unremarkable.  IMPRESSION: 1. Endotracheal tube with the tip 2.9 cm above the carina. 2. Low lung volumes with crowding of the interstitial markings. There is more focal left lower lobe airspace disease concerning for atelectasis versus pneumonia including aspiration pneumonia.   Electronically Signed   By: Elige Ko   On: 02/12/2013 23:54    ASSESSMENT / PLAN:  PULMONARY A: Acute respiratory failure 2nd to acute encephalopathy and aspiration pneumonia P:   -full vent support -f/u CXR and ABG -prn BD's  CARDIOVASCULAR A:  Hx of HTN, hyperlipidemia. P:  -prn labetalol for SBP > 170 -monitor hemodynamics -hold outpt ASA, lipitor for now -f/u cardiac enzymes  RENAL A:   No acute issues. P:   -monitor renal fx, urine outpt, electrolytes -check lactic acid level  GASTROINTESTINAL A:   Nutrition. Hx of gastroparesis, GERD. P:   -NPO -start tube feeds if unable to extubate soon -protonix for SUP  HEMATOLOGIC A:   Anemia - mild. P:  -f/u CBC -SQ heparin for DVT prevention  INFECTIOUS A:   Aspiration pneumonia. P:   -start zosyn -check cultures -f/u procalcitonin  ENDOCRINE A:   No issues.   P:   -monitor blood sugar on BMET  NEUROLOGIC A:   Acute encephalopathy likely secondary to drug  overdose ?intentional. Reported seizure x one en route to hospital 12/29. Hx of chronic pain, migraines, RLS, anxiety. P:   -f/u UDS -f/u EEG -diprivan with fentanyl and prn versed -defer neurology assessment for now -hold outpt xanax, neurotin, topamax, robaxin, percocet   Updated pt's mother at bedside.  CC time 50 minutes.  Coralyn Helling, MD [facility]Critical Care 02/13/2013, 1:17 AM Pager:  [phone] After 3pm call: [phone]

## 2013-02-13 NOTE — ED Notes (Signed)
RN paged Critical care about d/c order for propofol. Per CC, re-start the propofol.

## 2013-02-13 NOTE — ED Notes (Signed)
Pt attempting to pull at ET tube, sedation increased

## 2013-02-14 ENCOUNTER — Encounter: Payer: Self-pay | Admitting: Pulmonary Disease

## 2013-02-14 DIAGNOSIS — J690 Pneumonitis due to inhalation of food and vomit: Secondary | ICD-10-CM

## 2013-02-14 DIAGNOSIS — J9600 Acute respiratory failure, unspecified whether with hypoxia or hypercapnia: Secondary | ICD-10-CM

## 2013-02-14 LAB — CBC
HCT: 33.5 % — ABNORMAL LOW (ref 36.0–46.0)
Hemoglobin: 10.7 g/dL — ABNORMAL LOW (ref 12.0–15.0)
MCHC: 31.9 g/dL (ref 30.0–36.0)
Platelets: 187 10*3/uL (ref 150–400)
RBC: 4.02 MIL/uL (ref 3.87–5.11)
RDW: 16.1 % — ABNORMAL HIGH (ref 11.5–15.5)
WBC: 6.9 10*3/uL (ref 4.0–10.5)

## 2013-02-14 LAB — BASIC METABOLIC PANEL
BUN: 8 mg/dL (ref 6–23)
Chloride: 102 mEq/L (ref 96–112)
GFR calc non Af Amer: >90 mL/min (ref >90)
Glucose, Bld: 117 mg/dL — ABNORMAL HIGH (ref 70–99)
Potassium: 3.7 mEq/L (ref 3.7–5.3)
Sodium: 137 mEq/L (ref 137–147)

## 2013-02-14 LAB — PROCALCITONIN: Procalcitonin: <0.1 ng/mL

## 2013-02-14 MED ORDER — OXYCODONE-ACETAMINOPHEN 5-325 MG PO TABS: 1.0000 | ORAL_TABLET | Freq: Three times a day (TID) | ORAL | Status: DC | PRN

## 2013-02-14 NOTE — Progress Notes (Signed)
Pt. Discharged to home at this time.  IV's removed.  No pain or complaints at this time.  Elink notified.

## 2013-02-14 NOTE — Progress Notes (Signed)
Name: Sandra [name] MRN: [id number] DOB: January 01, 1971    ADMISSION DATE:  02/12/2013 CONSULTATION DATE:  02/13/2013  REFERRING MD : Ethelene Browns  CHIEF COMPLAINT:  Acute encephalopathy  BRIEF PATIENT DESCRIPTION:  42 yo female with hx of chronic pain c/o worsening leg pain.  Went to bed.  Family found pt unresponsive on floor, likely from drug overdose.  She was intubated in ER, and PCCM asked to admit.\ She was noted to have generalized tonic/clonic seizure en route to ED.   SIGNIFICANT EVENTS: 12/30 Admit  STUDIES:  12/30 CT head >> no intracranial pathology 12/30 CT neck >> No acute osseous injury of the cervical spine.  LINES / TUBES: PIV  CULTURES: Blood 12/30 >>neg Sputum 12/30 >>oral flora  ANTIBIOTICS: Zosyn 12/30 >> 12/31   SUBJECTIVE:awake, interactive, no obvious pain or dyspnea afebrile  VITAL SIGNS: Temp:  [97.6 F (36.4 C)-99.5 F (37.5 C)] 98 F (36.7 C) (12/31 0700) Pulse Rate:  [87-106] 92 (12/31 1000) Resp:  [16-23] 22 (12/31 1000) BP: (110-156)/(56-101) 148/84 mmHg (12/31 1000) SpO2:  [97 %-100 %] 100 % (12/31 1000) HEMODYNAMICS:   VENTILATOR SETTINGS:   INTAKE / OUTPUT: Intake/Output     12/30 0701 - 12/31 0700 12/31 0701 - 01/01 0700   I.V. (mL/kg) 1818 (18.4) 225 (2.3)   IV Piggyback 200    Total Intake(mL/kg) 2018 (20.4) 225 (2.3)   Urine (mL/kg/hr) 2930 (1.2) 430 (1.3)   Total Output 2930 430   Net -912 -205          PHYSICAL EXAMINATION: General: anxious affect Neuro: alert, awake, non focal HEENT:  Pupils dilated/reactive Cardiovascular:  Tachycardic, regular Lungs:  B/l rales Abdomen:  Soft, non tender, decreased bowel sounds Musculoskeletal:  No edema Skin:  No rashes  LABS:  CBC  Recent Labs Lab 02/12/13 2243 02/14/13 0345  WBC 7.3 6.9  HGB 10.9* 10.7*  HCT 35.1* 33.5*  PLT 204 187   Coag's  Recent Labs Lab 02/13/13 0345  APTT 26  INR 1.05   BMET  Recent Labs Lab 02/12/13 2243 02/13/13 0345  02/14/13 0345  NA 139 135* 137  K 4.3 4.5 3.7  CL 103 98 102  CO2 25 25 22   BUN 9 8 8   CREATININE 0.72 0.73 0.68  GLUCOSE 100* 122* 117*   Electrolytes  Recent Labs Lab 02/12/13 2243 02/13/13 0345 02/14/13 0345  CALCIUM 8.7 8.5 8.7  MG  --  1.7  --   PHOS  --  3.1  --    Sepsis Markers  Recent Labs Lab 02/13/13 0111 02/13/13 0345 02/14/13 0345  LATICACIDVEN 1.8  --   --   PROCALCITON  --  <0.10 <0.10   ABG  Recent Labs Lab 02/13/13 0004 02/13/13 0343  PHART 7.248* 7.354  PCO2ART 55.6* 42.0  PO2ART 95.0 228.0*   Liver Enzymes  Recent Labs Lab 02/12/13 2243 02/13/13 0345  AST 20 13  ALT 13 12  ALKPHOS 52 53  BILITOT 0.1* 0.3  ALBUMIN 3.5 3.6   Cardiac Enzymes  Recent Labs Lab 02/13/13 0345 02/13/13 0750 02/13/13 1425  TROPONINI <0.30 <0.30 <0.30   Glucose  Recent Labs Lab 02/12/13 2347  GLUCAP 121*    Imaging Ct Head Wo Contrast  02/13/2013   CLINICAL DATA:  Patient has children found her on conscious on the ground. Patient found only response to physical stimuli.  EXAM: CT HEAD WITHOUT CONTRAST  CT CERVICAL SPINE WITHOUT CONTRAST  TECHNIQUE: Multidetector CT imaging of the head  and cervical spine was performed following the standard protocol without intravenous contrast. Multiplanar CT image reconstructions of the cervical spine were also generated.  COMPARISON:  None.  FINDINGS: CT HEAD FINDINGS  There is no evidence of mass effect, midline shift or extra-axial fluid collections. There is no evidence of a space-occupying lesion or intracranial hemorrhage. There is no evidence of a cortical-based area of acute infarction.  The ventricles and sulci are appropriate for the patient's age. The basal cisterns are patent.  Visualized portions of the orbits are unremarkable. The visualized portions of the paranasal sinuses and mastoid air cells are unremarkable.  The osseous structures are unremarkable. There is endotracheal tube present.  CT CERVICAL  SPINE FINDINGS  The alignment is anatomic. The vertebral body heights are maintained. There is no acute fracture. There is no static listhesis. The prevertebral soft tissues are normal. The intraspinal soft tissues are not fully imaged on this examination due to poor soft tissue contrast, but there is no gross soft tissue abnormality.  The disc spaces are maintained.  There is biapical airspace disease which may reflect aspiration pneumonia.  IMPRESSION: 1.  No acute intracranial pathology.  2.  Biapical airspace disease concerning for aspiration pneumonia.  3.  No acute osseous injury of the cervical spine.   Electronically Signed   By: Elige Ko   On: 02/13/2013 00:31   Ct Cervical Spine Wo Contrast  02/13/2013   CLINICAL DATA:  Patient has children found her on conscious on the ground. Patient found only response to physical stimuli.  EXAM: CT HEAD WITHOUT CONTRAST  CT CERVICAL SPINE WITHOUT CONTRAST  TECHNIQUE: Multidetector CT imaging of the head and cervical spine was performed following the standard protocol without intravenous contrast. Multiplanar CT image reconstructions of the cervical spine were also generated.  COMPARISON:  None.  FINDINGS: CT HEAD FINDINGS  There is no evidence of mass effect, midline shift or extra-axial fluid collections. There is no evidence of a space-occupying lesion or intracranial hemorrhage. There is no evidence of a cortical-based area of acute infarction.  The ventricles and sulci are appropriate for the patient's age. The basal cisterns are patent.  Visualized portions of the orbits are unremarkable. The visualized portions of the paranasal sinuses and mastoid air cells are unremarkable.  The osseous structures are unremarkable. There is endotracheal tube present.  CT CERVICAL SPINE FINDINGS  The alignment is anatomic. The vertebral body heights are maintained. There is no acute fracture. There is no static listhesis. The prevertebral soft tissues are normal. The  intraspinal soft tissues are not fully imaged on this examination due to poor soft tissue contrast, but there is no gross soft tissue abnormality.  The disc spaces are maintained.  There is biapical airspace disease which may reflect aspiration pneumonia.  IMPRESSION: 1.  No acute intracranial pathology.  2.  Biapical airspace disease concerning for aspiration pneumonia.  3.  No acute osseous injury of the cervical spine.   Electronically Signed   By: Elige Ko   On: 02/13/2013 00:31   Dg Chest Port 1 View  02/13/2013   CLINICAL DATA:  Follow up aspiration pneumonia  EXAM: PORTABLE CHEST - 1 VIEW  COMPARISON:  02/12/2013  FINDINGS: There is an endotracheal tube with the tip 3 cm above the carina. There is a nasogastric tube within the stomach. There is left lower lobe hazy airspace disease which may reflect atelectasis versus pneumonia including aspiration pneumonia. There is no pleural effusion or pneumothorax. Stable cardiomediastinal silhouette.  The osseous structures are unremarkable.  IMPRESSION: No significant interval change compared with the prior exam. Persistent hazy left lower lobe airspace disease.   Electronically Signed   By: Elige Ko   On: 02/13/2013 01:40   Dg Chest Portable 1 View  02/12/2013   CLINICAL DATA:  Drug overdose, seizures  EXAM: PORTABLE CHEST - 1 VIEW  COMPARISON:  10/03/2012  FINDINGS: There is an endotracheal tube with the tip 2.9 cm above the carina. There is a nasogastric tube coursing below the diaphragm with the tip excluded from the field of view.  There are low lung volumes with crowding of the interstitial markings. There is left lower lobe airspace disease which may reflect atelectasis versus pneumonia. The heart and mediastinal contours are unremarkable.  The osseous structures are unremarkable.  IMPRESSION: 1. Endotracheal tube with the tip 2.9 cm above the carina. 2. Low lung volumes with crowding of the interstitial markings. There is more focal left lower  lobe airspace disease concerning for atelectasis versus pneumonia including aspiration pneumonia.   Electronically Signed   By: Elige Ko   On: 02/12/2013 23:54    ASSESSMENT / PLAN:  PULMONARY A: Acute respiratory failure 2nd to acute encephalopathy and aspiration pneumonia P:   -prn BD's  CARDIOVASCULAR A:  Hx of HTN, hyperlipidemia, h/o stress palpitations - cardiac enzymes neg P:  -prn labetalol for SBP > 170 -resume outpt ASA, lipitor for now   RENAL A:   No acute issues.lactate ok P:   -monitor renal fx, urine outpt, electrolytes   GASTROINTESTINAL A:   Nutrition. Hx of gastroparesis, GERD. P:   -resume diet  HEMATOLOGIC A:   Anemia - mild. P:  -f/u CBC -SQ heparin for DVT prevention  INFECTIOUS A:   Aspiration pneumonia,-low procalcitonin reassuring P:   -dc zosyn    ENDOCRINE A:   No issues.   P:   -monitor blood sugar on BMET  NEUROLOGIC A:   Acute encephalopathy likely secondary to drug overdose ?intentional. Reported seizure x one en route to hospital 12/29. Hx of chronic pain, migraines, RLS, anxiety. UDS POS barbiturates (fioricet) P:   - EEG neg -defer neurology assessment for now -OK to resume  outpt xanax, neurotin, topamax, robaxin, percocet   OK for dc today She has 6 kids at home , youngest 2 y.o. Lot of stress with estranged husband & court date She will need FU with PCP Deboraha Sprang & neurology soon   Cyril Mourning MD. FCCP. [facility] Pager [phone] If no response call [phone]    02/14/2013, 10:27 AM

## 2013-02-14 NOTE — Discharge Summary (Signed)
Physician Discharge Summary  Patient ID: Sandra [name] MRN: [id number] DOB/AGE: [birth year] 40 y.o.  Admit date: 02/12/2013 Discharge date: 02/14/2013    Discharge Diagnoses:  Acute Respiratory Failure Acute Encephalopathy Migranes Concern for Aspiration HTN HLD Hx Stress Palpitations Gastroparesis  GERD Anemia                                                                      DISCHARGE PLAN BY DIAGNOSIS     Acute Respiratory Failure Concern for Aspiration  Discharge Plan:  -no further pulmonary follow up required -no further abx, PCT negative -PRN Bronchodilators as prior to admit   Acute Encephalopathy Migranes  Discharge Plan:  -follow up with PCP  -hold sedating medications as able -called [facility] & left message for return call to patient for follow up for migraine management, post hospital for unresponsive episode. -no new anti-epileptics at d/c -reduce percocet frequency -stop benadryl, promethazine, ambien  HTN HLD Hx Stress Palpitations  Discharge Plan: -continue labetalol, atorvastatin  Gastroparesis  GERD  Discharge Plan: -continue dexilant -diabetic diet   Anemia  Discharge Plan: -follow CBC PRN with PCP                  DISCHARGE SUMMARY   Sandra [name] is a 42 y.o. y/o female with a PMH of GERD, DM, Gastroparesis, HTN, HLD, Migraines, Chronic low back pain, anxiety and anemia who presented to [facility] on 12/29 after being found unresponsive.  Patients family reported on admit that she complained of leg pain and went to bed around 4:30 PM.  She was later found unresponsive on the floor of her bedroom with blood around nares. There was concern she took several doses of xanax, ambien, and oxycodone. She was noted to have generalized tonic /clonic seizure en route to ED. She was intubated for airway protection.  Cardiac enzymes negative. She was empirically treated for aspiration on admission.  Procalcitonin was  negative as well as no fever or WBC and abx were discontinued.  Low clinical suspicion for infection.  Patient remained on mechanical ventilation for approximately 24 hours and was extubated on 12/30 without further respiratory difficulties.  She had no further seizure like activity during hospitalization.  EEG was evaluated and consistent with generalized non-specific cerebral dysfunction (encephalopathy).  No evidence of seizure or seizure predisposition noted.  Patient denied intent for self harm.  She indicates multiple psychosocial stressors with estranged husband and 6 children at home.  Pending discharge with instructions as above.     SIGNIFICANT EVENTS:  12/30 Admit after being found unresponsive by family  STUDIES:  12/30 CT head >> no intracranial pathology  12/30 CT neck >> No acute osseous injury of the cervical spine.   LINES / TUBES:  PIV   CULTURES:  Blood 12/30 >>neg  Sputum 12/30 >>oral flora   ANTIBIOTICS:  Zosyn 12/30 >> 12/31   Discharge Exam: General: anxious affect  Neuro: alert, awake, non focal  HEENT: Pupils dilated/reactive  Cardiovascular: Tachycardic, regular  Lungs: B/l rales  Abdomen: Soft, non tender, decreased bowel sounds  Musculoskeletal: No edema  Skin: No rashes   Filed Vitals:   02/14/13 0700 02/14/13 0800 02/14/13 0900 02/14/13 1000  BP: 145/97 142/92 133/85 148/84  Pulse: 101 102 103 92  Temp: 98 F (36.7 C)     TempSrc: Oral     Resp:  18 20 22   Height:      Weight:      SpO2: 100% 100% 100% 100%     Discharge Labs  BMET  Recent Labs Lab 02/12/13 2243 02/13/13 0345 02/14/13 0345  NA 139 135* 137  K 4.3 4.5 3.7  CL 103 98 102  CO2 25 25 22   GLUCOSE 100* 122* 117*  BUN 9 8 8   CREATININE 0.72 0.73 4.09  CALCIUM 8.7 8.5 8.7  MG  --  1.7  --   PHOS  --  3.1  --     CBC  Recent Labs Lab 02/12/13 2243 02/14/13 0345  HGB 10.9* 10.7*  HCT 35.1* 33.5*  WBC 7.3 6.9  PLT 204 187    Anti-Coagulation  Recent  Labs Lab 02/13/13 0345  INR 1.05    Discharge Orders   Future Orders Complete By Expires   Call MD for:  difficulty breathing, headache or visual disturbances  As directed    Call MD for:  extreme fatigue  As directed    Call MD for:  persistant dizziness or light-headedness  As directed    Call MD for:  severe uncontrolled pain  As directed    Call MD for:  temperature >100.4  As directed    Diet general  As directed    Discharge instructions  As directed    Comments:     Review your medications carefully.   Increase activity slowly  As directed        Follow-up Information   Follow up with Leanor Rubenstein, MD On 02/23/2013. (Appt at 12:00 )    Specialty:  Family Medicine   Contact information:   [address] [place] [address] [phone]          Medication List    STOP taking these medications       diphenhydrAMINE 25 MG tablet  Commonly known as:  BENADRYL     promethazine 25 MG tablet  Commonly known as:  PHENERGAN     zolpidem 10 MG tablet  Commonly known as:  AMBIEN      TAKE these medications       albuterol 108 (90 BASE) MCG/ACT inhaler  Commonly known as:  PROVENTIL HFA;VENTOLIN HFA  Inhale 2 puffs into the lungs every 2 (two) hours as needed for wheezing or shortness of breath.     ALPRAZolam 0.5 MG tablet  Commonly known as:  XANAX  Take 0.5 mg by mouth daily. For anxiety     atorvastatin 10 MG tablet  Commonly known as:  LIPITOR  Take 10 mg by mouth daily.     butalbital-acetaminophen-caffeine 50-325-40-30 MG per capsule  Commonly known as:  FIORICET WITH CODEINE  Take 1-2 capsules by mouth every 4 (four) hours as needed for headache or migraine.     DEXILANT 60 MG capsule  Generic drug:  dexlansoprazole  Take 60 mg by mouth 2 (two) times daily.     gabapentin 800 MG tablet  Commonly known as:  NEURONTIN  Take 1 tablet (800 mg total) by mouth 4 (four) times daily.     ibuprofen 200 MG tablet  Commonly known as:   ADVIL,MOTRIN  Take 400 mg by mouth 2 (two) times daily as needed. For pain     labetalol 300 MG tablet  Commonly known as:  NORMODYNE  Take 600 mg by mouth 2 (two) times daily.     methocarbamol 750 MG tablet  Commonly known as:  ROBAXIN  Take 750 mg by mouth every 6 (six) hours as needed (muscle aches).     oxyCODONE-acetaminophen 5-325 MG per tablet  Commonly known as:  PERCOCET/ROXICET  Take 1 tablet by mouth every 8 (eight) hours as needed.     topiramate 50 MG tablet  Commonly known as:  TOPAMAX  Take 1-2 tablets (50-100 mg total) by mouth at bedtime.          Disposition: Home.  No home needs identified.   Discharged Condition: Sandra [name] has met maximum benefit of inpatient care and is medically stable and cleared for discharge.  Patient is pending follow up as above.      Time spent on disposition:  Greater than 35 minutes.   Signed: Canary Brim, NP-C [facility] Pgr: [phone] Office: [phone]  Independently examined pt, evaluated data & formulated above discharge care plan with NP who scribed this note & edited by me.  Journii Nierman V.

## 2013-02-15 LAB — CULTURE, RESPIRATORY W GRAM STAIN

## 2013-02-17 NOTE — ED Provider Notes (Signed)
I saw and evaluated the patient, reviewed the resident's note and I agree with the findings and plan.  EKG Interpretation    Date/Time:    Ventricular Rate:    PR Interval:    QRS Duration:   QT Interval:    QTC Calculation:   R Axis:     Text Interpretation:             CRITICAL CARE Performed by: Ross MarcusHORTON, Madesyn Ast, F   Total critical care time: 35 min  Critical care time was exclusive of separately billable procedures and treating other patients.  Critical care was necessary to treat or prevent imminent or life-threatening deterioration.  Critical care was time spent personally by me on the following activities: development of treatment plan with patient and/or surrogate as well as nursing, discussions with consultants, evaluation of patient's response to treatment, examination of patient, obtaining history from patient or surrogate, ordering and performing treatments and interventions, ordering and review of laboratory studies, ordering and review of radiographic studies, pulse oximetry and re-evaluation of patient's condition.   Patient presents with AMS and lethargy.  Found down by family today.  Patient takes multiple sedating medications.  Patient has GCS of 6 on arrival.  Localizing to pain.  No obvious signs of trauma.  Patient intubated resident with snoring respirations. I was present for the procedure and agree with the resident's documentation. CT head neg.  Presumed OD with work-up in progress.  Will admit to critical care.  Shon Batonourtney F Amar Keenum, MD 02/17/13 [phone]

## 2013-02-19 LAB — CULTURE, BLOOD (ROUTINE X 2)
Culture: NO GROWTH
Culture: NO GROWTH

## 2013-04-12 ENCOUNTER — Ambulatory Visit: Payer: Medicaid Other | Admitting: Neurology

## 2013-05-10 ENCOUNTER — Telehealth: Payer: Self-pay | Admitting: *Deleted

## 2013-05-10 ENCOUNTER — Encounter: Payer: Self-pay | Admitting: Neurology

## 2013-05-10 ENCOUNTER — Ambulatory Visit (INDEPENDENT_AMBULATORY_CARE_PROVIDER_SITE_OTHER): Payer: Medicaid Other | Admitting: Neurology

## 2013-05-10 VITALS — BP 90/68 | HR 88 | Resp 16 | Ht 64.0 in | Wt 225.0 lb

## 2013-05-10 DIAGNOSIS — G43009 Migraine without aura, not intractable, without status migrainosus: Secondary | ICD-10-CM

## 2013-05-10 DIAGNOSIS — R569 Unspecified convulsions: Secondary | ICD-10-CM

## 2013-05-10 DIAGNOSIS — G4440 Drug-induced headache, not elsewhere classified, not intractable: Secondary | ICD-10-CM

## 2013-05-10 DIAGNOSIS — Z79899 Other long term (current) drug therapy: Secondary | ICD-10-CM

## 2013-05-10 NOTE — Telephone Encounter (Signed)
I spoke with Loraine at [facility] they will  make appt for this patient with [facility] [place] they will call patient and also let our office know the day and time of appt

## 2013-05-10 NOTE — Patient Instructions (Addendum)
1.  I really think a lot of the problems are due to polypharmacy.  The daily headaches are definitely related to the daily use of pain relievers.  If not willing to stop these, I will try to refer you to a headache specialist. 2.  For the episode of unresponsiveness, we will get MRI of brain and another EEG.  If results are suspicious for increased seizure tendency, will discuss starting an antiseizure medication with you. 3.  [place] law prohibits driving for 6 months from incident of unexplained loss of consciousness.  That means you would not be eligible for driving until at least August 13, 2013. 4.  Follow up in 3 months (after June 29) Sleep EEG   Main Entrance A  Admission  05/14/13 7:45 (DO NOT SLEEP after midnight ) [facility] 05/23/13  At 2:45pn
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
## 2013-05-14 ENCOUNTER — Ambulatory Visit (HOSPITAL_COMMUNITY)
Admission: RE | Admit: 2013-05-14 | Discharge: 2013-05-14 | Disposition: A | Discharge status: Home / Self Care | Payer: Medicaid Other | Source: Ambulatory Visit | Attending: Neurology | Admitting: Neurology

## 2013-05-14 DIAGNOSIS — R569 Unspecified convulsions: Secondary | ICD-10-CM | POA: Insufficient documentation

## 2013-05-14 NOTE — Procedures (Signed)
SLEEP-DEPRIVED ELECTROENCEPHALOGRAM REPORT  Date of Study: 05/14/2013  Patient's Name: Sandra [name] MRN: [id number] Date of Birth: 1970/06/11  Indication: Seizure  Medications: Alprazolam, Fioricet, gabapentin, Percocet, topiramate  Technical Summary: This is a multichannel digital EEG recording, using the international 10-20 placement system.  Spike detection software was employed.  Description: The EEG background is symmetric, with a well-developed posterior dominant rhythm of 9-10 Hz, which is reactive to eye opening and closing.  Diffuse beta activity is seen, with a bilateral frontal preponderance.  No focal or generalized abnormalities are seen.  No focal or generalized epileptiform discharges are seen.  Stage II sleep is seen, with normal and symmetric sleep patterns.  Hyperventilation and photic stimulation were performed, and produced no abnormalities.  ECG revealed normal cardiac rate and rhythm.  Impression: This is a normal sleep-deprived EEG of the awake and asleep states, with activating procedures.  A normal study does not rule out the possibility of a seizure disorder in this patient.  Adam R. Everlena CooperJaffe, DO

## 2013-05-14 NOTE — Progress Notes (Signed)
Sleep deprived EEG completed. 

## 2013-05-17 ENCOUNTER — Telehealth: Payer: Self-pay | Admitting: *Deleted

## 2013-05-17 NOTE — Telephone Encounter (Signed)
Left message for patient  That EEG was normal and Per Dr Everlena CooperJaffe he favors NOT taking seizure medication

## 2013-05-23 ENCOUNTER — Telehealth: Payer: Self-pay | Admitting: *Deleted

## 2013-05-23 ENCOUNTER — Ambulatory Visit (HOSPITAL_COMMUNITY)
Admission: RE | Admit: 2013-05-23 | Discharge: 2013-05-23 | Disposition: A | Discharge status: Home / Self Care | Payer: Medicaid Other | Source: Ambulatory Visit | Attending: Neurology | Admitting: Neurology

## 2013-05-23 DIAGNOSIS — R569 Unspecified convulsions: Secondary | ICD-10-CM

## 2013-05-23 DIAGNOSIS — R51 Headache: Secondary | ICD-10-CM | POA: Insufficient documentation

## 2013-05-23 NOTE — Telephone Encounter (Signed)
Message copied by Fredirick MaudlinVAN DER GLAS, Daquavion Catala E on Wed May 23, 2013  2:06 PM ------      Message from: JAFFE, ADAM R      Created: Wed May 23, 2013  1:30 PM       MRI brain is normal.      ----- Message -----         From: Rad Results In Interface         Sent: 05/23/2013   1:01 PM           To: Cira ServantAdam Robert Jaffe, DO                   ------

## 2013-05-23 NOTE — Telephone Encounter (Signed)
Left message on patients voicemail that MRI of brain is normal.

## 2013-06-18 ENCOUNTER — Telehealth: Payer: Self-pay | Admitting: *Deleted

## 2013-06-18 NOTE — Telephone Encounter (Signed)
Called patient again in attempt to r/s appointment, left message that she may come for the 3 pm slot or r/s for another day.

## 2013-06-18 NOTE — Telephone Encounter (Signed)
Left vm, to r/s appointment with Dr. Hosie PoissonSumner.

## 2013-06-19 ENCOUNTER — Ambulatory Visit: Payer: Medicaid Other | Admitting: Neurology

## 2013-06-22 ENCOUNTER — Ambulatory Visit: Payer: Medicaid Other | Admitting: Neurology

## 2013-06-26 ENCOUNTER — Encounter: Payer: Self-pay | Admitting: Neurology

## 2013-06-26 ENCOUNTER — Ambulatory Visit (INDEPENDENT_AMBULATORY_CARE_PROVIDER_SITE_OTHER): Payer: Medicaid Other | Admitting: Neurology

## 2013-06-26 VITALS — BP 140/90 | HR 81 | Ht 65.0 in | Wt 227.0 lb

## 2013-06-26 DIAGNOSIS — Z79899 Other long term (current) drug therapy: Secondary | ICD-10-CM

## 2013-06-26 DIAGNOSIS — G4440 Drug-induced headache, not elsewhere classified, not intractable: Secondary | ICD-10-CM

## 2013-06-26 DIAGNOSIS — G43009 Migraine without aura, not intractable, without status migrainosus: Secondary | ICD-10-CM

## 2013-06-26 MED ORDER — MAGNESIUM OXIDE 400 MG PO TABS: 400.0000 mg | ORAL_TABLET | Freq: Every day | ORAL | Status: DC

## 2013-06-26 NOTE — Patient Instructions (Signed)
Overall you are doing fairly well but I do want to suggest a few things today:   Remember to drink plenty of fluid, eat healthy meals and do not skip any meals. Try to eat protein with a every meal and eat a healthy snack such as fruit or nuts in between meals. Try to keep a regular sleep-wake schedule and try to exercise daily, particularly in the form of walking, 20-30 minutes a day, if you can.   As far as your medications are concerned, I would like to suggest the following: 1)Please take Magnesium 400mg  daily 2)Please work with a pain specialist to begin tapering down your percocet as this is likely contributing to your headache symptoms.  3)Please limit your Fioricet to less than 2 times a week  Follow up with Dr Everlena CooperJaffe in June as schedule.   Please call us with any interim questions, concerns, problems, updates or refill requests.   My clinical assistant and will answer any of your questions and relay your messages to me and also relay most of my messages to you.   Our phone number is [phone]. We also have an after hours call service for urgent matters and there is a physician on-call for urgent questions. For any emergencies you know to call 911 or go to the nearest emergency room

## 2013-06-26 NOTE — Progress Notes (Signed)
NEUROLOGY CONSULTATION NOTE  Sandra [name] MRN: [id number] DOB: [birth year]  Referring provider: Dr. Wynelle Link Primary care provider: Dr. Wynelle Link  Reason for consult:  Headache and seizure  HISTORY OF PRESENT ILLNESS: Sandra [name] is a 43 year old right-handed woman with history of paroxysmal atrial fibrillation, GERD, diabetes mellitus, gastroparesis, hypertension, hyperlipidemia, chronic low back pain, anemia and anxiety who presents for migraines and seizure.  Records and images were personally reviewed where available  Notes headaches started in 1997-8, have gotten progressively worse since then but recently thinks they have gotten better on her current medication regimen. She reports having a daily headache, described as bifrontal pressure type pain, can also have episodes of right side pounding pain. Headaches can last up to one week. Some nausea, + photo and phonophobia. At worst can get to 10/10 pain. Feels headache is triggered by stress and lack of sleep. Typically gets 3-4 hours of sleep a night. Notes some snoring in her sleep, unclear if apnea events.   Currently taking Fioricet and Percocet for headache. Reports taking 3 Fioricets per week, typically taking around 5 percocet a week. Has been on these regimen for greater than 10 years. Reports she has been evaluated by Dr. Everlena Cooper in the past but she did not agree with his plan. (though she states he told her to stop all medications which is not indicated per his plan)  Past abortive therapy:  Sumatriptan po/sq (initially effective and then ineffective and made headaches worse), Maxalt (ineffective), Amerge (ineffective), Relpax (ineffective), ibuprofen (ineffective), tylenol (ineffective), naproxen (ineffective), Excedrin (takes edge off) Past preventative therapy:  Depakote (ineffective, 6 months), Elavil (ineffective, 2 months), Botox (ineffective, 3 rounds), supraorbital nerve blocks (ineffective)  Current abortive therapy:   Benadryl (daily), Fioricet (daily), Percocet (daily).  On this regimen for 5 years Current preventative therapy:  Topamax 100mg  twice daily (on and off for several years, not effective) Other medications:  Xanax, Lexapro, Gabapentin (for RLS), Labetolol, Ambien    Past Medical History  Diagnosis Date  . GERD (gastroesophageal reflux disease)   . Gastroparesis   . Hypertension   . Restless legs   . H/O echocardiogram     a. 02/2010: done for htn/tachy while pregnant - EF 55-60%, grade 1 d/dysf, mildly dilated LA.  . High cholesterol   . Atrial fibrillation with RVR     a. Dx 09/2012, spont conv to NSR.  Marland Kitchen Sleep apnea   . Gestational diabetes   . Migraines   . Chronic lower back pain   . Anxiety   . Kidney stones   . Anemia     a. Mild with Hgb 11.2, 09/2012. UA with mod Hgb. Instructed to f/u PCP.    PAST SURGICAL HISTORY: Past Surgical History  Procedure Laterality Date  . Tubal ligation  2012  . Cesarean section  2012  . Dilation and curettage of uterus  2006; 2010  . Cystoscopy/retrograde/ureteroscopy/stone extraction with basket  1987  . Cystoscopy w/ ureteral stent placement  2006  . Back surgery      MEDICATIONS: Current Outpatient Prescriptions on File Prior to Visit  Medication Sig Dispense Refill  . albuterol (PROVENTIL HFA;VENTOLIN HFA) 108 (90 BASE) MCG/ACT inhaler Inhale 2 puffs into the lungs every 2 (two) hours as needed for wheezing or shortness of breath.      . ALPRAZolam (XANAX) 0.5 MG tablet Take 0.5 mg by mouth daily. For anxiety      . atorvastatin (LIPITOR) 10 MG tablet Take 10  mg by mouth daily.      . butalbital-acetaminophen-caffeine (FIORICET WITH CODEINE) 50-325-40-30 MG per capsule Take 1-2 capsules by mouth every 4 (four) hours as needed for headache or migraine.      Marland Kitchen. dexlansoprazole (DEXILANT) 60 MG capsule Take 60 mg by mouth 2 (two) times daily.      . diphenhydrAMINE (BENADRYL) 50 MG tablet Take 50 mg by mouth at bedtime as needed for  itching.      . diphenoxylate-atropine (LOMOTIL) 2.5-0.025 MG per tablet Take by mouth 4 (four) times daily as needed for diarrhea or loose stools.      Marland Kitchen. escitalopram (LEXAPRO) 10 MG tablet Take 10 mg by mouth daily.      Marland Kitchen. gabapentin (NEURONTIN) 800 MG tablet Take 1 tablet (800 mg total) by mouth 4 (four) times daily.  120 tablet  3  . ibuprofen (ADVIL,MOTRIN) 200 MG tablet Take 400 mg by mouth 2 (two) times daily as needed. For pain      . labetalol (NORMODYNE) 300 MG tablet Take 600 mg by mouth 2 (two) times daily.       Marland Kitchen. oxyCODONE-acetaminophen (PERCOCET/ROXICET) 5-325 MG per tablet Take 1 tablet by mouth every 8 (eight) hours as needed.  90 tablet  0  . promethazine (PHENERGAN) 25 MG tablet Take 25 mg by mouth every 6 (six) hours as needed for nausea or vomiting.      . topiramate (TOPAMAX) 50 MG tablet Take 1-2 tablets (50-100 mg total) by mouth at bedtime.  60 tablet  4  . zolpidem (AMBIEN) 10 MG tablet Take 10 mg by mouth at bedtime as needed for sleep.       No current facility-administered medications on file prior to visit.    ALLERGIES: Allergies  Allergen Reactions  . Iodides Hives  . Imitrex [Sumatriptan]     Pt states she takes imitrex injection-"sometimes it works and sometimes doesn't"  . Metoclopramide Hcl Anxiety    FAMILY HISTORY: Family History  Problem Relation Age of Onset  . Diabetes Mother   . Hypertension Mother   . Migraines Father   . Migraines Brother     SOCIAL HISTORY: History   Social History  . Marital Status: Married    Spouse Name: N/A    Number of Children: N/A  . Years of Education: N/A   Occupational History  . Not on file.   Social History Main Topics  . Smoking status: Never Smoker   . Smokeless tobacco: Never Used  . Alcohol Use: No  . Drug Use: No  . Sexual Activity: Not Currently    Birth Control/ Protection: Surgical   Other Topics Concern  . Not on file   Social History Narrative   Separated, 6 children   Right  handed   2 yr college   3-4 cans of soda          REVIEW OF SYSTEMS: Constitutional: No fevers, chills, or sweats, no generalized fatigue, change in appetite Eyes: No visual changes, double vision, eye pain Ear, nose and throat: No hearing loss, ear pain, nasal congestion, sore throat Cardiovascular: No chest pain, palpitations Respiratory:  No shortness of breath at rest or with exertion, wheezes GastrointestinaI: No nausea, vomiting, diarrhea, abdominal pain, fecal incontinence Genitourinary:  No dysuria, urinary retention or frequency Musculoskeletal:  No neck pain, back pain Integumentary: No rash, pruritus, skin lesions Neurological: as above Psychiatric: No depression, insomnia, anxiety Endocrine: No palpitations, fatigue, diaphoresis, mood swings, change in appetite, change in weight,  increased thirst Hematologic/Lymphatic:  No anemia, purpura, petechiae. Allergic/Immunologic: no itchy/runny eyes, nasal congestion, recent allergic reactions, rashes  PHYSICAL EXAM: Filed Vitals:   06/26/13 1043  BP: 140/90  Pulse: 81   General: No acute distress Head:  Normocephalic/atraumatic Neck: supple, no paraspinal tenderness, full range of motion Back: No paraspinal tenderness Heart: regular rate and rhythm Lungs: Clear to auscultation bilaterally. Vascular: No carotid bruits. Neurological Exam: Mental status: alert and oriented to person, place, and time, recent and remote memory intact, fund of knowledge intact, attention and concentration intact, speech fluent and not dysarthric, language intact. Cranial nerves: CN I: not tested CN II: pupils equal, round and reactive to light, visual fields intact, fundi unremarkable, without vessel changes, exudates, hemorrhages or papilledema. CN III, IV, VI:  full range of motion, no nystagmus, no ptosis CN V: facial sensation intact CN VII: upper and lower face symmetric CN VIII: hearing intact CN IX, X: gag intact, uvula midline CN  XI: sternocleidomastoid and trapezius muscles intact CN XII: tongue midline Bulk & Tone: normal, no fasciculations. Motor: 5/5 throughout Sensation: temperature and vibration intact. Deep Tendon Reflexes: 2+ throughout, toes down Finger to nose testing: no dysmetria Heel to shin: no dysmetria Gait: normal station and stride.  Able to turn and walk in tandem. Romberg negative.  IMPRESSION: 1.  Migraine without aura 2.  Medication-overuse headaches 3.  Polypharmacy  PLAN: 1.  Agree with Dr. Everlena CooperJaffe that this patients headaches are likely related to medication-overuse from the percocet and fioricet. Counseled her on my findings and offered her the option of trying magnesium 400mg  to help decrease daily headaches. Counseled her on the importance of limiting her pain medications and not taking the Fioricet more than 2 times per week. Gave her the option of seeing a pain specialist to help treat her chronic pain. She stated she does not wish to taper down the Fioricet and/or Percocet but would try the magnesium and will discuss a pain referral with Dr. Wynelle LinkSun. She will follow up with Dr Everlena CooperJaffe in June.   Thank you for the opportunity to assist in caring for your patient.  Elspeth ChoPeter Bryna Razavi, DO

## 2013-08-10 ENCOUNTER — Ambulatory Visit: Payer: Medicaid Other | Admitting: Neurology

## 2013-09-10 ENCOUNTER — Encounter: Payer: Self-pay | Admitting: Gastroenterology

## 2013-10-22 ENCOUNTER — Emergency Department (HOSPITAL_BASED_OUTPATIENT_CLINIC_OR_DEPARTMENT_OTHER): Payer: Medicaid Other

## 2013-10-22 ENCOUNTER — Encounter (HOSPITAL_BASED_OUTPATIENT_CLINIC_OR_DEPARTMENT_OTHER): Payer: Self-pay | Admitting: Emergency Medicine

## 2013-10-22 ENCOUNTER — Emergency Department (HOSPITAL_BASED_OUTPATIENT_CLINIC_OR_DEPARTMENT_OTHER)
Admission: EM | Admit: 2013-10-22 | Discharge: 2013-10-22 | Disposition: A | Discharge status: Home / Self Care | Payer: Medicaid Other | Attending: Emergency Medicine | Admitting: Emergency Medicine

## 2013-10-22 DIAGNOSIS — F3289 Other specified depressive episodes: Secondary | ICD-10-CM | POA: Insufficient documentation

## 2013-10-22 DIAGNOSIS — I4891 Unspecified atrial fibrillation: Secondary | ICD-10-CM | POA: Insufficient documentation

## 2013-10-22 DIAGNOSIS — G43909 Migraine, unspecified, not intractable, without status migrainosus: Secondary | ICD-10-CM | POA: Insufficient documentation

## 2013-10-22 DIAGNOSIS — Z87442 Personal history of urinary calculi: Secondary | ICD-10-CM | POA: Diagnosis not present

## 2013-10-22 DIAGNOSIS — I10 Essential (primary) hypertension: Secondary | ICD-10-CM | POA: Diagnosis not present

## 2013-10-22 DIAGNOSIS — E7800 Pure hypercholesterolemia, unspecified: Secondary | ICD-10-CM | POA: Diagnosis not present

## 2013-10-22 DIAGNOSIS — Z8632 Personal history of gestational diabetes: Secondary | ICD-10-CM | POA: Insufficient documentation

## 2013-10-22 DIAGNOSIS — G2581 Restless legs syndrome: Secondary | ICD-10-CM | POA: Diagnosis not present

## 2013-10-22 DIAGNOSIS — F329 Major depressive disorder, single episode, unspecified: Secondary | ICD-10-CM | POA: Diagnosis not present

## 2013-10-22 DIAGNOSIS — Z862 Personal history of diseases of the blood and blood-forming organs and certain disorders involving the immune mechanism: Secondary | ICD-10-CM | POA: Insufficient documentation

## 2013-10-22 DIAGNOSIS — K219 Gastro-esophageal reflux disease without esophagitis: Secondary | ICD-10-CM | POA: Diagnosis not present

## 2013-10-22 DIAGNOSIS — Z79899 Other long term (current) drug therapy: Secondary | ICD-10-CM | POA: Insufficient documentation

## 2013-10-22 DIAGNOSIS — E669 Obesity, unspecified: Secondary | ICD-10-CM | POA: Insufficient documentation

## 2013-10-22 DIAGNOSIS — G8929 Other chronic pain: Secondary | ICD-10-CM | POA: Insufficient documentation

## 2013-10-22 DIAGNOSIS — F410 Panic disorder [episodic paroxysmal anxiety] without agoraphobia: Secondary | ICD-10-CM | POA: Insufficient documentation

## 2013-10-22 DIAGNOSIS — J069 Acute upper respiratory infection, unspecified: Secondary | ICD-10-CM | POA: Diagnosis not present

## 2013-10-22 DIAGNOSIS — R059 Cough, unspecified: Secondary | ICD-10-CM | POA: Diagnosis present

## 2013-10-22 DIAGNOSIS — R05 Cough: Secondary | ICD-10-CM | POA: Insufficient documentation

## 2013-10-22 HISTORY — DX: Panic disorder (episodic paroxysmal anxiety): F41.0

## 2013-10-22 HISTORY — DX: Depression, unspecified: F32.A

## 2013-10-22 HISTORY — DX: Obesity, unspecified: E66.9

## 2013-10-22 HISTORY — DX: Major depressive disorder, single episode, unspecified: F32.9

## 2013-10-22 NOTE — Discharge Instructions (Signed)
Upper Respiratory Infection, Adult An upper respiratory infection (URI) is also sometimes known as the common cold. The upper respiratory tract includes the nose, sinuses, throat, trachea, and bronchi. Bronchi are the airways leading to the lungs. Most people improve within 1 week, but symptoms can last up to 2 weeks. A residual cough may last even longer.  CAUSES Many different viruses can infect the tissues lining the upper respiratory tract. The tissues become irritated and inflamed and often become very moist. Mucus production is also common. A cold is contagious. You can easily spread the virus to others by oral contact. This includes kissing, sharing a glass, coughing, or sneezing. Touching your mouth or nose and then touching a surface, which is then touched by another person, can also spread the virus. SYMPTOMS  Symptoms typically develop 1 to 3 days after you come in contact with a cold virus. Symptoms vary from person to person. They may include:  Runny nose.  Sneezing.  Nasal congestion.  Sinus irritation.  Sore throat.  Loss of voice (laryngitis).  Cough.  Fatigue.  Muscle aches.  Loss of appetite.  Headache.  Low-grade fever. DIAGNOSIS  You might diagnose your own cold based on familiar symptoms, since most people get a cold 2 to 3 times a year. Your caregiver can confirm this based on your exam. Most importantly, your caregiver can check that your symptoms are not due to another disease such as strep throat, sinusitis, pneumonia, asthma, or epiglottitis. Blood tests, throat tests, and X-rays are not necessary to diagnose a common cold, but they may sometimes be helpful in excluding other more serious diseases. Your caregiver will decide if any further tests are required. RISKS AND COMPLICATIONS  You may be at risk for a more severe case of the common cold if you smoke cigarettes, have chronic heart disease (such as heart failure) or lung disease (such as asthma), or if  you have a weakened immune system. The very young and very old are also at risk for more serious infections. Bacterial sinusitis, middle ear infections, and bacterial pneumonia can complicate the common cold. The common cold can worsen asthma and chronic obstructive pulmonary disease (COPD). Sometimes, these complications can require emergency medical care and may be life-threatening. PREVENTION  The best way to protect against getting a cold is to practice good hygiene. Avoid oral or hand contact with people with cold symptoms. Wash your hands often if contact occurs. There is no clear evidence that vitamin C, vitamin E, echinacea, or exercise reduces the chance of developing a cold. However, it is always recommended to get plenty of rest and practice good nutrition. TREATMENT  Treatment is directed at relieving symptoms. There is no cure. Antibiotics are not effective, because the infection is caused by a virus, not by bacteria. Treatment may include:  Increased fluid intake. Sports drinks offer valuable electrolytes, sugars, and fluids.  Breathing heated mist or steam (vaporizer or shower).  Eating chicken soup or other clear broths, and maintaining good nutrition.  Getting plenty of rest.  Using gargles or lozenges for comfort.  Controlling fevers with ibuprofen or acetaminophen as directed by your caregiver.  Increasing usage of your inhaler if you have asthma. Zinc gel and zinc lozenges, taken in the first 24 hours of the common cold, can shorten the duration and lessen the severity of symptoms. Pain medicines may help with fever, muscle aches, and throat pain. A variety of non-prescription medicines are available to treat congestion and runny nose. Your caregiver   can make recommendations and may suggest nasal or lung inhalers for other symptoms.  HOME CARE INSTRUCTIONS   Only take over-the-counter or prescription medicines for pain, discomfort, or fever as directed by your  caregiver.  Use a warm mist humidifier or inhale steam from a shower to increase air moisture. This may keep secretions moist and make it easier to breathe.  Drink enough water and fluids to keep your urine clear or pale yellow.  Rest as needed.  Return to work when your temperature has returned to normal or as your caregiver advises. You may need to stay home longer to avoid infecting others. You can also use a face mask and careful hand washing to prevent spread of the virus. SEEK MEDICAL CARE IF:   After the first few days, you feel you are getting worse rather than better.  You need your caregiver's advice about medicines to control symptoms.  You develop chills, worsening shortness of breath, or brown or red sputum. These may be signs of pneumonia.  You develop yellow or brown nasal discharge or pain in the face, especially when you bend forward. These may be signs of sinusitis.  You develop a fever, swollen neck glands, pain with swallowing, or white areas in the back of your throat. These may be signs of strep throat. SEEK IMMEDIATE MEDICAL CARE IF:   You have a fever.  You develop severe or persistent headache, ear pain, sinus pain, or chest pain.  You develop wheezing, a prolonged cough, cough up blood, or have a change in your usual mucus (if you have chronic lung disease).  You develop sore muscles or a stiff neck. Document Released: 07/28/2000 Document Revised: 04/26/2011 Document Reviewed: 05/09/2013 [employer] Patient Information 2015 [employer]. This information is not intended to replace advice given to you by your health care provider. Make sure you discuss any questions you have with your health care provider.  

## 2013-10-22 NOTE — ED Notes (Signed)
Patient transported to X-ray 

## 2013-10-22 NOTE — ED Notes (Signed)
Deep cough x3 days.  Sts she feels like something needs to come up but nothing does.  Also some sinus congestion.

## 2013-10-22 NOTE — ED Provider Notes (Signed)
CSN: [id number]     Arrival date & time 10/22/13  2003 History   First MD Initiated Contact with Patient 10/22/13 2025    T Chief Complaint  Patient presents with  . Cough   HPI  HPI Comments: Sandra [name] is a 43 y.o. female who presents to the Emergency Department complaining of cough.  She began having uri symptoms three days ago with congestion and sore throat.  She now has cough that is nonproductive.  She is not a smoker.  She denies asthma and has some dyspnea with coughing.  She has take benadryl without relief.   Past Medical History  Diagnosis Date  . GERD (gastroesophageal reflux disease)   . Gastroparesis   . Hypertension   . Restless legs   . H/O echocardiogram     a. 02/2010: done for htn/tachy while pregnant - EF 55-60%, grade 1 d/dysf, mildly dilated LA.  . High cholesterol   . Atrial fibrillation with RVR     a. Dx 09/2012, spont conv to NSR.  Marland Kitchen Sleep apnea   . Gestational diabetes   . Migraines   . Chronic lower back pain   . Anxiety   . Kidney stones   . Anemia     a. Mild with Hgb 11.2, 09/2012. UA with mod Hgb. Instructed to f/u PCP.  Marland Kitchen Obesity   . Anxiety   . Depression   . Panic attack    Past Surgical History  Procedure Laterality Date  . Tubal ligation  2012  . Cesarean section  2012  . Dilation and curettage of uterus  2006; 2010  . Cystoscopy/retrograde/ureteroscopy/stone extraction with basket  1987  . Cystoscopy w/ ureteral stent placement  2006  . Back surgery     Family History  Problem Relation Age of Onset  . Diabetes Mother   . Hypertension Mother   . Migraines Father   . Migraines Brother    History  Substance Use Topics  . Smoking status: Never Smoker   . Smokeless tobacco: Never Used  . Alcohol Use: No   OB History   Grav Para Term Preterm Abortions TAB SAB Ect Mult Living                 Review of Systems  All other systems reviewed and are negative.     Allergies  Iodides; Imitrex; and Metoclopramide  hcl  Home Medications   Prior to Admission medications   Medication Sig Start Date End Date Taking? Authorizing Provider  buprenorphine (BUTRANS) 20 MCG/HR PTWK patch Place 20 mcg onto the skin once a week.   Yes Historical Provider, MD  albuterol (PROVENTIL HFA;VENTOLIN HFA) 108 (90 BASE) MCG/ACT inhaler Inhale 2 puffs into the lungs every 2 (two) hours as needed for wheezing or shortness of breath.    Historical Provider, MD  ALPRAZolam Prudy Feeler) 0.5 MG tablet Take 0.5 mg by mouth daily. For anxiety    Historical Provider, MD  atorvastatin (LIPITOR) 10 MG tablet Take 10 mg by mouth daily.    Historical Provider, MD  butalbital-acetaminophen-caffeine (FIORICET WITH CODEINE) 50-325-40-30 MG per capsule Take 1-2 capsules by mouth every 4 (four) hours as needed for headache or migraine.    Historical Provider, MD  dexlansoprazole (DEXILANT) 60 MG capsule Take 60 mg by mouth 2 (two) times daily.    Historical Provider, MD  diphenhydrAMINE (BENADRYL) 50 MG tablet Take 50 mg by mouth at bedtime as needed for itching.    Historical Provider, MD  diphenoxylate-atropine (LOMOTIL) 2.5-0.025 MG per tablet Take by mouth 4 (four) times daily as needed for diarrhea or loose stools.    Historical Provider, MD  escitalopram (LEXAPRO) 10 MG tablet Take 10 mg by mouth daily.    Historical Provider, MD  gabapentin (NEURONTIN) 800 MG tablet Take 1 tablet (800 mg total) by mouth 4 (four) times daily. 06/21/11   Ranelle Oyster, MD  ibuprofen (ADVIL,MOTRIN) 200 MG tablet Take 400 mg by mouth 2 (two) times daily as needed. For pain    Historical Provider, MD  labetalol (NORMODYNE) 300 MG tablet Take 600 mg by mouth 2 (two) times daily.     Historical Provider, MD  magnesium oxide (MAG-OX) 400 MG tablet Take 1 tablet (400 mg total) by mouth daily. 06/26/13   Omelia Blackwater, DO  oxyCODONE-acetaminophen (PERCOCET/ROXICET) 5-325 MG per tablet Take 1 tablet by mouth every 8 (eight) hours as needed. 02/14/13   Jeanella Craze, NP  promethazine (PHENERGAN) 25 MG tablet Take 25 mg by mouth every 6 (six) hours as needed for nausea or vomiting.    Historical Provider, MD  topiramate (TOPAMAX) 50 MG tablet Take 1-2 tablets (50-100 mg total) by mouth at bedtime. 01/18/12   Ranelle Oyster, MD  zolpidem (AMBIEN) 10 MG tablet Take 10 mg by mouth at bedtime as needed for sleep.    Historical Provider, MD   BP 128/87  Pulse 79  Temp(Src) 98 F (36.7 C)  Resp 16  Ht  (1.626 m)  Wt 230 lb (104.327 kg)  BMI 39.46 kg/m2  SpO2 100%  LMP 10/12/2013 Physical Exam  Nursing note and vitals reviewed. Constitutional: She is oriented to person, place, and time. She appears well-developed and well-nourished.  obese  HENT:  Head: Normocephalic and atraumatic.  Right Ear: External ear normal.  Left Ear: External ear normal.  Nose: Nose normal.  Mouth/Throat: Oropharynx is clear and moist.  Eyes: Conjunctivae and EOM are normal. Pupils are equal, round, and reactive to light.  Neck: Normal range of motion. Neck supple. No JVD present. No tracheal deviation present. No thyromegaly present.  Cardiovascular: Normal rate, regular rhythm, normal heart sounds and intact distal pulses.   Pulmonary/Chest: Effort normal and breath sounds normal. No respiratory distress. She has no wheezes.  Abdominal: Soft. Bowel sounds are normal. She exhibits no mass. There is no tenderness. There is no guarding.  Musculoskeletal: Normal range of motion.  Lymphadenopathy:    She has no cervical adenopathy.  Neurological: She is alert and oriented to person, place, and time. She has normal reflexes. No cranial nerve deficit or sensory deficit. Gait normal. GCS eye subscore is 4. GCS verbal subscore is 5. GCS motor subscore is 6.  Reflex Scores:      Bicep reflexes are 2+ on the right side and 2+ on the left side.      Patellar reflexes are 2+ on the right side and 2+ on the left side. Skin: Skin is warm and dry.  Psychiatric: She has a  normal mood and affect. Her behavior is normal. Judgment and thought content normal.    ED Course  Procedures (including critical care time) Labs Review Labs Reviewed - No data to display  Imaging Review Dg Chest 2 View  10/22/2013   CLINICAL DATA:  Cough, shortness of breath  EXAM: CHEST  2 VIEW  COMPARISON:  02/13/2013  FINDINGS: Lungs are clear.  No pleural effusion or pneumothorax.  The heart is normal in size.  Visualized osseous structures are within normal limits.  IMPRESSION: No evidence of acute cardiopulmonary disease.   Electronically Signed   By: Charline Bills M.D.   On: 10/22/2013 20:31     EKG Interpretation None     DIAGNOSTIC STUDIES: Oxygen Saturation is 100% on room air, normal by my interpretation.    COORDINATION OF CARE:    MDM   Rudene Anda, MD 10/22/13 [phone]

## 2014-01-14 ENCOUNTER — Emergency Department (HOSPITAL_COMMUNITY)
Admission: EM | Admit: 2014-01-14 | Discharge: 2014-01-14 | Disposition: A | Discharge status: Home / Self Care | Payer: Medicaid Other | Attending: Emergency Medicine | Admitting: Emergency Medicine

## 2014-01-14 ENCOUNTER — Emergency Department (HOSPITAL_COMMUNITY): Payer: Medicaid Other

## 2014-01-14 ENCOUNTER — Encounter (HOSPITAL_COMMUNITY): Payer: Self-pay | Admitting: Emergency Medicine

## 2014-01-14 DIAGNOSIS — Z87442 Personal history of urinary calculi: Secondary | ICD-10-CM | POA: Diagnosis not present

## 2014-01-14 DIAGNOSIS — Z862 Personal history of diseases of the blood and blood-forming organs and certain disorders involving the immune mechanism: Secondary | ICD-10-CM | POA: Diagnosis not present

## 2014-01-14 DIAGNOSIS — F329 Major depressive disorder, single episode, unspecified: Secondary | ICD-10-CM | POA: Diagnosis not present

## 2014-01-14 DIAGNOSIS — S301XXA Contusion of abdominal wall, initial encounter: Secondary | ICD-10-CM | POA: Insufficient documentation

## 2014-01-14 DIAGNOSIS — K219 Gastro-esophageal reflux disease without esophagitis: Secondary | ICD-10-CM | POA: Diagnosis not present

## 2014-01-14 DIAGNOSIS — Z79899 Other long term (current) drug therapy: Secondary | ICD-10-CM | POA: Diagnosis not present

## 2014-01-14 DIAGNOSIS — I10 Essential (primary) hypertension: Secondary | ICD-10-CM | POA: Diagnosis not present

## 2014-01-14 DIAGNOSIS — Z8632 Personal history of gestational diabetes: Secondary | ICD-10-CM | POA: Insufficient documentation

## 2014-01-14 DIAGNOSIS — S161XXA Strain of muscle, fascia and tendon at neck level, initial encounter: Secondary | ICD-10-CM | POA: Diagnosis not present

## 2014-01-14 DIAGNOSIS — Y998 Other external cause status: Secondary | ICD-10-CM | POA: Insufficient documentation

## 2014-01-14 DIAGNOSIS — F410 Panic disorder [episodic paroxysmal anxiety] without agoraphobia: Secondary | ICD-10-CM | POA: Diagnosis not present

## 2014-01-14 DIAGNOSIS — Z8679 Personal history of other diseases of the circulatory system: Secondary | ICD-10-CM | POA: Diagnosis not present

## 2014-01-14 DIAGNOSIS — Y9389 Activity, other specified: Secondary | ICD-10-CM | POA: Diagnosis not present

## 2014-01-14 DIAGNOSIS — Y92410 Unspecified street and highway as the place of occurrence of the external cause: Secondary | ICD-10-CM | POA: Diagnosis not present

## 2014-01-14 DIAGNOSIS — E669 Obesity, unspecified: Secondary | ICD-10-CM | POA: Diagnosis not present

## 2014-01-14 DIAGNOSIS — S20219A Contusion of unspecified front wall of thorax, initial encounter: Secondary | ICD-10-CM

## 2014-01-14 DIAGNOSIS — S0990XA Unspecified injury of head, initial encounter: Secondary | ICD-10-CM | POA: Diagnosis not present

## 2014-01-14 DIAGNOSIS — G8929 Other chronic pain: Secondary | ICD-10-CM | POA: Diagnosis not present

## 2014-01-14 LAB — COMPREHENSIVE METABOLIC PANEL
ALT: 8 U/L (ref 0–35)
AST: 10 U/L (ref 0–37)
Albumin: 3.5 g/dL (ref 3.5–5.2)
Alkaline Phosphatase: 40 U/L (ref 39–117)
Anion gap: 11 (ref 5–15)
BUN: 12 mg/dL (ref 6–23)
CALCIUM: 9.2 mg/dL (ref 8.4–10.5)
CO2: 24 mEq/L (ref 19–32)
Chloride: 105 mEq/L (ref 96–112)
Creatinine, Ser: 0.87 mg/dL (ref 0.50–1.10)
GFR calc non Af Amer: 82 mL/min — ABNORMAL LOW (ref >90)
GLUCOSE: 94 mg/dL (ref 70–99)
Potassium: 4.4 mEq/L (ref 3.7–5.3)
Sodium: 140 mEq/L (ref 137–147)
TOTAL PROTEIN: 6.9 g/dL (ref 6.0–8.3)
Total Bilirubin: 0.4 mg/dL (ref 0.3–1.2)

## 2014-01-14 LAB — CBC WITH DIFFERENTIAL/PLATELET
Basophils Absolute: 0 10*3/uL (ref 0.0–0.1)
Basophils Relative: 1 % (ref 0–1)
EOS ABS: 0.1 10*3/uL (ref 0.0–0.7)
Eosinophils Relative: 1 % (ref 0–5)
HCT: 33.7 % — ABNORMAL LOW (ref 36.0–46.0)
HEMOGLOBIN: 10.3 g/dL — AB (ref 12.0–15.0)
LYMPHS ABS: 1.2 10*3/uL (ref 0.7–4.0)
LYMPHS PCT: 29 % (ref 12–46)
MCH: 25.8 pg — AB (ref 26.0–34.0)
MCHC: 30.6 g/dL (ref 30.0–36.0)
MCV: 84.3 fL (ref 78.0–100.0)
Monocytes Absolute: 0.4 10*3/uL (ref 0.1–1.0)
Monocytes Relative: 9 % (ref 3–12)
NEUTROS PCT: 60 % (ref 43–77)
Neutro Abs: 2.5 10*3/uL (ref 1.7–7.7)
Platelets: 163 10*3/uL (ref 150–400)
RBC: 4 MIL/uL (ref 3.87–5.11)
RDW: 15.2 % (ref 11.5–15.5)
WBC: 4.1 10*3/uL (ref 4.0–10.5)

## 2014-01-14 LAB — URINE MICROSCOPIC-ADD ON

## 2014-01-14 LAB — RAPID URINE DRUG SCREEN, HOSP PERFORMED
Amphetamines: NOT DETECTED
BARBITURATES: NOT DETECTED
Benzodiazepines: POSITIVE — AB
Cocaine: NOT DETECTED
OPIATES: NOT DETECTED
Tetrahydrocannabinol: NOT DETECTED

## 2014-01-14 LAB — URINALYSIS, ROUTINE W REFLEX MICROSCOPIC
Bilirubin Urine: NEGATIVE
Glucose, UA: NEGATIVE mg/dL
Hgb urine dipstick: NEGATIVE
Ketones, ur: NEGATIVE mg/dL
LEUKOCYTES UA: NEGATIVE
NITRITE: NEGATIVE
Protein, ur: 30 mg/dL — AB
SPECIFIC GRAVITY, URINE: 1.018 (ref 1.005–1.030)
Urobilinogen, UA: 0.2 mg/dL (ref 0.0–1.0)
pH: 7.5 (ref 5.0–8.0)

## 2014-01-14 LAB — ETHANOL: Alcohol, Ethyl (B): <11 mg/dL (ref 0–11)

## 2014-01-14 LAB — PREGNANCY, URINE: Preg Test, Ur: NEGATIVE

## 2014-01-14 MED ORDER — HYDROCODONE-ACETAMINOPHEN 5-325 MG PO TABS: 1.0000 | ORAL_TABLET | ORAL | Status: DC | PRN
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
## 2014-01-14 NOTE — ED Notes (Addendum)
Pt is alert and tearful, requesting to be taken off of backboard. Backboard and collar removed by Dr. Judd Lienelo.

## 2014-01-15 ENCOUNTER — Encounter (HOSPITAL_COMMUNITY): Payer: Self-pay

## 2014-01-15 ENCOUNTER — Inpatient Hospital Stay (HOSPITAL_COMMUNITY): Payer: Medicaid Other

## 2014-01-15 ENCOUNTER — Inpatient Hospital Stay (HOSPITAL_COMMUNITY)
Admission: EM | Admit: 2014-01-15 | Discharge: 2014-01-15 | DRG: 948 | Disposition: A | Discharge status: Home / Self Care | Payer: Medicaid Other | Attending: Internal Medicine | Admitting: Internal Medicine

## 2014-01-15 ENCOUNTER — Emergency Department (HOSPITAL_COMMUNITY): Payer: Medicaid Other

## 2014-01-15 DIAGNOSIS — I480 Paroxysmal atrial fibrillation: Secondary | ICD-10-CM | POA: Diagnosis present

## 2014-01-15 DIAGNOSIS — M545 Low back pain: Secondary | ICD-10-CM | POA: Diagnosis present

## 2014-01-15 DIAGNOSIS — Z79899 Other long term (current) drug therapy: Secondary | ICD-10-CM

## 2014-01-15 DIAGNOSIS — E119 Type 2 diabetes mellitus without complications: Secondary | ICD-10-CM | POA: Diagnosis present

## 2014-01-15 DIAGNOSIS — F329 Major depressive disorder, single episode, unspecified: Secondary | ICD-10-CM | POA: Diagnosis present

## 2014-01-15 DIAGNOSIS — K219 Gastro-esophageal reflux disease without esophagitis: Secondary | ICD-10-CM | POA: Diagnosis present

## 2014-01-15 DIAGNOSIS — G4730 Sleep apnea, unspecified: Secondary | ICD-10-CM | POA: Diagnosis present

## 2014-01-15 DIAGNOSIS — F419 Anxiety disorder, unspecified: Secondary | ICD-10-CM | POA: Diagnosis present

## 2014-01-15 DIAGNOSIS — G8929 Other chronic pain: Secondary | ICD-10-CM | POA: Diagnosis present

## 2014-01-15 DIAGNOSIS — G43909 Migraine, unspecified, not intractable, without status migrainosus: Secondary | ICD-10-CM | POA: Diagnosis present

## 2014-01-15 DIAGNOSIS — E785 Hyperlipidemia, unspecified: Secondary | ICD-10-CM | POA: Diagnosis present

## 2014-01-15 DIAGNOSIS — I10 Essential (primary) hypertension: Secondary | ICD-10-CM | POA: Diagnosis present

## 2014-01-15 DIAGNOSIS — R4182 Altered mental status, unspecified: Principal | ICD-10-CM | POA: Diagnosis present

## 2014-01-15 DIAGNOSIS — T50905A Adverse effect of unspecified drugs, medicaments and biological substances, initial encounter: Secondary | ICD-10-CM

## 2014-01-15 LAB — BASIC METABOLIC PANEL
ANION GAP: 14 (ref 5–15)
BUN: 12 mg/dL (ref 6–23)
CALCIUM: 9.3 mg/dL (ref 8.4–10.5)
CO2: 19 meq/L (ref 19–32)
CREATININE: 0.84 mg/dL (ref 0.50–1.10)
Chloride: 105 mEq/L (ref 96–112)
GFR calc Af Amer: >90 mL/min (ref >90)
GFR calc non Af Amer: 85 mL/min — ABNORMAL LOW (ref >90)
Glucose, Bld: 92 mg/dL (ref 70–99)
Potassium: 4.6 mEq/L (ref 3.7–5.3)
Sodium: 138 mEq/L (ref 137–147)

## 2014-01-15 LAB — AMMONIA: AMMONIA: 52 umol/L (ref 11–60)

## 2014-01-15 LAB — ETHANOL

## 2014-01-15 MED ORDER — SODIUM CHLORIDE 0.9 % IJ SOLN: 3.0000 mL | Freq: Two times a day (BID) | INTRAMUSCULAR | Status: DC

## 2014-01-15 MED ORDER — SODIUM CHLORIDE 0.9 % IV SOLN: INTRAVENOUS | Status: DC

## 2014-01-15 MED ORDER — ONDANSETRON HCL 4 MG/2ML IJ SOLN: 4.0000 mg | Freq: Four times a day (QID) | INTRAMUSCULAR | Status: DC | PRN

## 2014-01-15 MED ORDER — NALOXONE HCL 0.4 MG/ML IJ SOLN
0.4000 mg | Freq: Once | INTRAMUSCULAR | Status: AC
  Administered 2014-01-15: 0.4 mg via INTRAVENOUS
  Filled 2014-01-15: qty 1

## 2014-01-15 MED ORDER — ACETAMINOPHEN 325 MG PO TABS: 650.0000 mg | ORAL_TABLET | Freq: Four times a day (QID) | ORAL | Status: DC | PRN

## 2014-01-15 MED ORDER — LEVALBUTEROL HCL 0.63 MG/3ML IN NEBU: 0.6300 mg | INHALATION_SOLUTION | Freq: Four times a day (QID) | RESPIRATORY_TRACT | Status: DC | PRN

## 2014-01-15 MED ORDER — ENOXAPARIN SODIUM 40 MG/0.4ML ~~LOC~~ SOLN
40.0000 mg | SUBCUTANEOUS | Status: DC
  Filled 2014-01-15: qty 0.4

## 2014-01-15 MED ORDER — ONDANSETRON HCL 4 MG PO TABS: 4.0000 mg | ORAL_TABLET | Freq: Four times a day (QID) | ORAL | Status: DC | PRN

## 2014-01-15 MED ORDER — ACETAMINOPHEN 650 MG RE SUPP: 650.0000 mg | Freq: Four times a day (QID) | RECTAL | Status: DC | PRN
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
## 2014-01-15 NOTE — ED Notes (Addendum)
PT INFORMS THIS WRITER SHE IS NOT BEING ADMITTED. SHE WILL BE SIGNING AMA FORMS. THIS WRITER REQUESTING ADMITTING MD TO BE PAGED.ICU MADE AWARE
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
## 2014-01-15 NOTE — ED Notes (Signed)
Notified Dr. Radford PaxBeaton of pt lethargic and confused state.

## 2014-01-15 NOTE — ED Notes (Signed)
Unable to get IV access until now

## 2014-01-15 NOTE — ED Notes (Signed)
MD at bedside. [facility]
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
## 2014-01-15 NOTE — Progress Notes (Signed)
CM visited pt. Assessed for needs.  Pt denied needs, DME, home services "I will be okay."

## 2014-01-15 NOTE — ED Notes (Signed)
Pt declined to be admitted. Pt discharged. Pt encouraged to follow up

## 2014-01-15 NOTE — ED Notes (Signed)
Patient transported to X-ray 

## 2014-01-29 ENCOUNTER — Other Ambulatory Visit: Payer: Self-pay | Admitting: Pain Medicine

## 2014-01-29 ENCOUNTER — Ambulatory Visit
Admission: RE | Admit: 2014-01-29 | Discharge: 2014-01-29 | Disposition: A | Discharge status: Home / Self Care | Payer: Medicaid Other | Source: Ambulatory Visit | Attending: Pain Medicine | Admitting: Pain Medicine

## 2014-01-29 DIAGNOSIS — M549 Dorsalgia, unspecified: Secondary | ICD-10-CM

## 2014-01-29 DIAGNOSIS — M542 Cervicalgia: Secondary | ICD-10-CM

## 2014-01-30 ENCOUNTER — Other Ambulatory Visit: Payer: Self-pay | Admitting: Pain Medicine

## 2014-01-30 ENCOUNTER — Other Ambulatory Visit: Payer: Self-pay | Admitting: Osteopathic Medicine

## 2014-01-30 ENCOUNTER — Ambulatory Visit
Admission: RE | Admit: 2014-01-30 | Discharge: 2014-01-30 | Disposition: A | Discharge status: Home / Self Care | Payer: Medicaid Other | Source: Ambulatory Visit | Attending: Pain Medicine | Admitting: Pain Medicine

## 2014-01-30 DIAGNOSIS — M549 Dorsalgia, unspecified: Secondary | ICD-10-CM

## 2014-04-10 ENCOUNTER — Encounter (HOSPITAL_BASED_OUTPATIENT_CLINIC_OR_DEPARTMENT_OTHER): Payer: Self-pay

## 2014-04-10 ENCOUNTER — Emergency Department (HOSPITAL_BASED_OUTPATIENT_CLINIC_OR_DEPARTMENT_OTHER)
Admission: EM | Admit: 2014-04-10 | Discharge: 2014-04-11 | Disposition: A | Discharge status: Home / Self Care | Payer: Medicaid Other | Attending: Emergency Medicine | Admitting: Emergency Medicine

## 2014-04-10 DIAGNOSIS — Z3202 Encounter for pregnancy test, result negative: Secondary | ICD-10-CM | POA: Insufficient documentation

## 2014-04-10 DIAGNOSIS — Z87442 Personal history of urinary calculi: Secondary | ICD-10-CM | POA: Insufficient documentation

## 2014-04-10 DIAGNOSIS — Z862 Personal history of diseases of the blood and blood-forming organs and certain disorders involving the immune mechanism: Secondary | ICD-10-CM | POA: Insufficient documentation

## 2014-04-10 DIAGNOSIS — Z79899 Other long term (current) drug therapy: Secondary | ICD-10-CM | POA: Insufficient documentation

## 2014-04-10 DIAGNOSIS — I10 Essential (primary) hypertension: Secondary | ICD-10-CM | POA: Insufficient documentation

## 2014-04-10 DIAGNOSIS — Z8632 Personal history of gestational diabetes: Secondary | ICD-10-CM | POA: Diagnosis not present

## 2014-04-10 DIAGNOSIS — G8929 Other chronic pain: Secondary | ICD-10-CM | POA: Insufficient documentation

## 2014-04-10 DIAGNOSIS — E785 Hyperlipidemia, unspecified: Secondary | ICD-10-CM | POA: Insufficient documentation

## 2014-04-10 DIAGNOSIS — K219 Gastro-esophageal reflux disease without esophagitis: Secondary | ICD-10-CM | POA: Insufficient documentation

## 2014-04-10 DIAGNOSIS — R400 Somnolence: Secondary | ICD-10-CM

## 2014-04-10 DIAGNOSIS — E669 Obesity, unspecified: Secondary | ICD-10-CM | POA: Insufficient documentation

## 2014-04-10 DIAGNOSIS — R4182 Altered mental status, unspecified: Secondary | ICD-10-CM | POA: Diagnosis present

## 2014-04-10 DIAGNOSIS — I4891 Unspecified atrial fibrillation: Secondary | ICD-10-CM | POA: Diagnosis not present

## 2014-04-10 LAB — URINALYSIS, ROUTINE W REFLEX MICROSCOPIC
Bilirubin Urine: NEGATIVE
GLUCOSE, UA: 250 mg/dL — AB
HGB URINE DIPSTICK: NEGATIVE
KETONES UR: NEGATIVE mg/dL
Leukocytes, UA: NEGATIVE
Nitrite: NEGATIVE
Protein, ur: NEGATIVE mg/dL
Specific Gravity, Urine: 1.021 (ref 1.005–1.030)
Urobilinogen, UA: 0.2 mg/dL (ref 0.0–1.0)
pH: 7 (ref 5.0–8.0)

## 2014-04-10 LAB — PREGNANCY, URINE: Preg Test, Ur: NEGATIVE

## 2014-04-10 LAB — RAPID URINE DRUG SCREEN, HOSP PERFORMED
AMPHETAMINES: NOT DETECTED
Barbiturates: NOT DETECTED
Benzodiazepines: POSITIVE — AB
Cocaine: NOT DETECTED
OPIATES: NOT DETECTED
Tetrahydrocannabinol: NOT DETECTED
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
## 2014-04-10 NOTE — ED Notes (Signed)
Pt answered all my ?s appropriately-she did nod off and was easily awakened throughout triage-neuro intact

## 2014-04-10 NOTE — ED Notes (Signed)
Pt was taken to tx area in w/c-stood and sat on stretcher then stated she needed to void-ambulated to BR w/o difficulty/assist

## 2014-04-11 NOTE — ED Notes (Signed)
MD at bedside. 
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
## 2014-06-16 ENCOUNTER — Emergency Department (HOSPITAL_COMMUNITY)
Admission: EM | Admit: 2014-06-16 | Discharge: 2014-06-16 | Disposition: A | Discharge status: Home / Self Care | Payer: Medicaid Other | Attending: Emergency Medicine | Admitting: Emergency Medicine

## 2014-06-16 ENCOUNTER — Encounter (HOSPITAL_COMMUNITY): Payer: Self-pay | Admitting: *Deleted

## 2014-06-16 DIAGNOSIS — M1990 Unspecified osteoarthritis, unspecified site: Secondary | ICD-10-CM | POA: Diagnosis not present

## 2014-06-16 DIAGNOSIS — I4891 Unspecified atrial fibrillation: Secondary | ICD-10-CM | POA: Diagnosis not present

## 2014-06-16 DIAGNOSIS — G43909 Migraine, unspecified, not intractable, without status migrainosus: Secondary | ICD-10-CM | POA: Diagnosis not present

## 2014-06-16 DIAGNOSIS — G2581 Restless legs syndrome: Secondary | ICD-10-CM | POA: Insufficient documentation

## 2014-06-16 DIAGNOSIS — E669 Obesity, unspecified: Secondary | ICD-10-CM | POA: Insufficient documentation

## 2014-06-16 DIAGNOSIS — M25561 Pain in right knee: Secondary | ICD-10-CM | POA: Insufficient documentation

## 2014-06-16 DIAGNOSIS — M25562 Pain in left knee: Secondary | ICD-10-CM | POA: Insufficient documentation

## 2014-06-16 DIAGNOSIS — Z87440 Personal history of urinary (tract) infections: Secondary | ICD-10-CM | POA: Diagnosis not present

## 2014-06-16 DIAGNOSIS — I959 Hypotension, unspecified: Secondary | ICD-10-CM | POA: Insufficient documentation

## 2014-06-16 DIAGNOSIS — F410 Panic disorder [episodic paroxysmal anxiety] without agoraphobia: Secondary | ICD-10-CM | POA: Insufficient documentation

## 2014-06-16 DIAGNOSIS — Z3202 Encounter for pregnancy test, result negative: Secondary | ICD-10-CM | POA: Diagnosis not present

## 2014-06-16 DIAGNOSIS — Z79899 Other long term (current) drug therapy: Secondary | ICD-10-CM | POA: Diagnosis not present

## 2014-06-16 DIAGNOSIS — Z862 Personal history of diseases of the blood and blood-forming organs and certain disorders involving the immune mechanism: Secondary | ICD-10-CM | POA: Diagnosis not present

## 2014-06-16 DIAGNOSIS — M25521 Pain in right elbow: Secondary | ICD-10-CM | POA: Diagnosis not present

## 2014-06-16 DIAGNOSIS — I10 Essential (primary) hypertension: Secondary | ICD-10-CM | POA: Insufficient documentation

## 2014-06-16 DIAGNOSIS — M549 Dorsalgia, unspecified: Secondary | ICD-10-CM | POA: Diagnosis present

## 2014-06-16 DIAGNOSIS — K219 Gastro-esophageal reflux disease without esophagitis: Secondary | ICD-10-CM | POA: Diagnosis not present

## 2014-06-16 DIAGNOSIS — E780 Pure hypercholesterolemia: Secondary | ICD-10-CM | POA: Diagnosis not present

## 2014-06-16 DIAGNOSIS — M25512 Pain in left shoulder: Secondary | ICD-10-CM | POA: Insufficient documentation

## 2014-06-16 DIAGNOSIS — F329 Major depressive disorder, single episode, unspecified: Secondary | ICD-10-CM | POA: Diagnosis not present

## 2014-06-16 DIAGNOSIS — Z8632 Personal history of gestational diabetes: Secondary | ICD-10-CM | POA: Insufficient documentation

## 2014-06-16 DIAGNOSIS — M25522 Pain in left elbow: Secondary | ICD-10-CM | POA: Insufficient documentation

## 2014-06-16 DIAGNOSIS — G8929 Other chronic pain: Secondary | ICD-10-CM | POA: Insufficient documentation

## 2014-06-16 LAB — POC URINE PREG, ED: Preg Test, Ur: NEGATIVE

## 2014-06-16 LAB — RAPID URINE DRUG SCREEN, HOSP PERFORMED
AMPHETAMINES: NOT DETECTED
Barbiturates: NOT DETECTED
Benzodiazepines: POSITIVE — AB
Cocaine: NOT DETECTED
Opiates: POSITIVE — AB
Tetrahydrocannabinol: NOT DETECTED

## 2014-06-16 LAB — BASIC METABOLIC PANEL
ANION GAP: 5 (ref 5–15)
BUN: 6 mg/dL (ref 6–20)
CO2: 27 mmol/L (ref 22–32)
Calcium: 8.3 mg/dL — ABNORMAL LOW (ref 8.9–10.3)
Chloride: 105 mmol/L (ref 101–111)
Creatinine, Ser: 0.67 mg/dL (ref 0.44–1.00)
Glucose, Bld: 127 mg/dL — ABNORMAL HIGH (ref 70–99)
POTASSIUM: 4.4 mmol/L (ref 3.5–5.1)
SODIUM: 137 mmol/L (ref 135–145)

## 2014-06-16 LAB — CBC
HCT: 33.5 % — ABNORMAL LOW (ref 36.0–46.0)
HEMOGLOBIN: 10.2 g/dL — AB (ref 12.0–15.0)
MCH: 25.4 pg — AB (ref 26.0–34.0)
MCHC: 30.4 g/dL (ref 30.0–36.0)
MCV: 83.5 fL (ref 78.0–100.0)
Platelets: 121 10*3/uL — ABNORMAL LOW (ref 150–400)
RBC: 4.01 MIL/uL (ref 3.87–5.11)
RDW: 14.4 % (ref 11.5–15.5)
WBC: 5.9 10*3/uL (ref 4.0–10.5)

## 2014-06-16 LAB — CBG MONITORING, ED: Glucose-Capillary: 124 mg/dL — ABNORMAL HIGH (ref 70–99)

## 2014-06-16 MED ORDER — SODIUM CHLORIDE 0.9 % IV BOLUS (SEPSIS)
2000.0000 mL | Freq: Once | INTRAVENOUS | Status: AC
Start: 2014-06-16 — End: 2014-06-16
  Administered 2014-06-16: 2000 mL via INTRAVENOUS

## 2014-06-16 NOTE — Discharge Instructions (Signed)
Orthostatic Hypotension Stop Xanax. Stop labetalol. Take your other medications as prescribed. Drink at least Six 8 ounce glasses of water daily. Call Dr. Wynelle LinkSun tomorrow to schedule an office appointment for this week. Return if you feel worse for any reason. Orthostatic hypotension is a sudden drop in blood pressure. It happens when you quickly stand up from a seated or lying position. You may feel dizzy or light-headed. This can last for just a few seconds or for up to a few minutes. It is usually not a serious problem. However, if this happens frequently or gets worse, it can be a sign of something more serious. CAUSES  Different things can cause orthostatic hypotension, including:   Loss of body fluids (dehydration).  Medicines that lower blood pressure.  Sudden changes in posture, such as standing up quickly after you have been sitting or lying down.  Taking too much of your medicine. SIGNS AND SYMPTOMS   Light-headedness or dizziness.   Fainting or near-fainting.   A fast heart rate.   Weakness.   Feeling tired (fatigue).  DIAGNOSIS  Your health care provider may do several things to help diagnose your condition and identify the cause. These may include:   Taking a medical history and doing a physical exam.  Checking your blood pressure. Your health care provider will check your blood pressure when you are:  Lying down.  Sitting.  Standing.  Using tilt table testing. In this test, you lie down on a table that moves from a lying position to a standing position. You will be strapped onto the table. This test monitors your blood pressure and heart rate when you are in different positions. TREATMENT  Treatment will vary depending on the cause. Possible treatments include:   Changing the dosage of your medicines.  Wearing compression stockings on your lower legs.  Standing up slowly after sitting or lying down.  Eating more salt.  Eating frequent, small  meals.  In some cases, getting IV fluids.  Taking medicine to enhance fluid retention. HOME CARE INSTRUCTIONS  Only take over-the-counter or prescription medicines as directed by your health care provider.  Follow your health care provider's instructions for changing the dosage of your current medicines.  Do not stop or adjust your medicine on your own.  Stand up slowly after sitting or lying down. This allows your body to adjust to the different position.  Wear compression stockings as directed.  Eat extra salt as directed.  Do not add extra salt to your diet unless directed to by your health care provider.  Eat frequent, small meals.  Avoid standing suddenly after eating.  Avoid hot showers or excessive heat as directed by your health care provider.  Keep all follow-up appointments. SEEK MEDICAL CARE IF:  You continue to feel dizzy or light-headed after standing.  You feel groggy or confused.  You feel cold, clammy, or sick to your stomach (nauseous).  You have blurred vision.  You feel short of breath. SEEK IMMEDIATE MEDICAL CARE IF:   You faint after standing.  You have chest pain.  You have difficulty breathing.   You lose feeling or movement in your arms or legs.   You have slurred speech or difficulty talking, or you are unable to talk.  MAKE SURE YOU:   Understand these instructions.  Will watch your condition.  Will get help right away if you are not doing well or get worse. Document Released: 01/22/2002 Document Revised: 02/06/2013 Document Reviewed: 11/24/2012 [employer] Patient Information  2015 [employer]. This information is not intended to replace advice given to you by your health care provider. Make sure you discuss any questions you have with your health care provider. ° °

## 2014-06-16 NOTE — ED Notes (Signed)
Patient declined wheelchair at time of DC

## 2014-06-16 NOTE — ED Notes (Signed)
Patient ambulatory around room without difficulty Patient threatening to pull out PIV site herself because she wants to go home Dr. Shela CommonsJ made aware and is currently at bedside speaking with patient

## 2014-06-16 NOTE — ED Notes (Signed)
Per EMS, pt from home, called out for joint stiffness when she woke up at 0500 today.  Upon arrival, pt was found lethargic and hypotensive but is A&Ox 4.  Pt reports taking her HS meds last night and none today.  Pt's 44yo daughter spoke to EMS and reported that pt has had episodes of "passing out out of nowhere" in the past.

## 2014-06-16 NOTE — ED Notes (Signed)
Patient demanding to go home, states that she feels better and has to take care of her kids Patient has been in NAD since assuming care at 1900 Will make EDP aware

## 2014-06-16 NOTE — ED Notes (Signed)
Per EDP, patient to be DC home

## 2014-06-16 NOTE — ED Notes (Signed)

## 2014-06-16 NOTE — ED Notes (Signed)
RN is obtaining labs when starting IV
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
## 2014-06-27 ENCOUNTER — Emergency Department (HOSPITAL_BASED_OUTPATIENT_CLINIC_OR_DEPARTMENT_OTHER): Payer: Medicaid Other

## 2014-06-27 ENCOUNTER — Emergency Department (HOSPITAL_BASED_OUTPATIENT_CLINIC_OR_DEPARTMENT_OTHER)
Admission: EM | Admit: 2014-06-27 | Discharge: 2014-06-27 | Disposition: A | Discharge status: Home / Self Care | Payer: Medicaid Other | Attending: Emergency Medicine | Admitting: Emergency Medicine

## 2014-06-27 DIAGNOSIS — Y9289 Other specified places as the place of occurrence of the external cause: Secondary | ICD-10-CM | POA: Insufficient documentation

## 2014-06-27 DIAGNOSIS — F329 Major depressive disorder, single episode, unspecified: Secondary | ICD-10-CM | POA: Insufficient documentation

## 2014-06-27 DIAGNOSIS — E780 Pure hypercholesterolemia: Secondary | ICD-10-CM | POA: Insufficient documentation

## 2014-06-27 DIAGNOSIS — S4991XA Unspecified injury of right shoulder and upper arm, initial encounter: Secondary | ICD-10-CM | POA: Insufficient documentation

## 2014-06-27 DIAGNOSIS — Y9389 Activity, other specified: Secondary | ICD-10-CM | POA: Diagnosis not present

## 2014-06-27 DIAGNOSIS — F419 Anxiety disorder, unspecified: Secondary | ICD-10-CM | POA: Diagnosis not present

## 2014-06-27 DIAGNOSIS — Z862 Personal history of diseases of the blood and blood-forming organs and certain disorders involving the immune mechanism: Secondary | ICD-10-CM | POA: Diagnosis not present

## 2014-06-27 DIAGNOSIS — G8929 Other chronic pain: Secondary | ICD-10-CM | POA: Insufficient documentation

## 2014-06-27 DIAGNOSIS — E669 Obesity, unspecified: Secondary | ICD-10-CM | POA: Insufficient documentation

## 2014-06-27 DIAGNOSIS — I10 Essential (primary) hypertension: Secondary | ICD-10-CM | POA: Insufficient documentation

## 2014-06-27 DIAGNOSIS — Z87442 Personal history of urinary calculi: Secondary | ICD-10-CM | POA: Diagnosis not present

## 2014-06-27 DIAGNOSIS — X58XXXA Exposure to other specified factors, initial encounter: Secondary | ICD-10-CM | POA: Diagnosis not present

## 2014-06-27 DIAGNOSIS — Z8632 Personal history of gestational diabetes: Secondary | ICD-10-CM | POA: Insufficient documentation

## 2014-06-27 DIAGNOSIS — Z79899 Other long term (current) drug therapy: Secondary | ICD-10-CM | POA: Diagnosis not present

## 2014-06-27 DIAGNOSIS — Y998 Other external cause status: Secondary | ICD-10-CM | POA: Insufficient documentation

## 2014-06-27 DIAGNOSIS — K219 Gastro-esophageal reflux disease without esophagitis: Secondary | ICD-10-CM | POA: Diagnosis not present

## 2014-06-27 MED ORDER — HYDROCODONE-ACETAMINOPHEN 5-325 MG PO TABS
2.0000 | ORAL_TABLET | Freq: Once | ORAL | Status: AC
  Administered 2014-06-27: 2 via ORAL
  Filled 2014-06-27: qty 2

## 2014-06-27 MED ORDER — HYDROCODONE-ACETAMINOPHEN 5-325 MG PO TABS: 1.0000 | ORAL_TABLET | ORAL | Status: DC | PRN

## 2014-06-27 MED ORDER — IBUPROFEN 800 MG PO TABS
800.0000 mg | ORAL_TABLET | Freq: Once | ORAL | Status: AC
  Administered 2014-06-27: 800 mg via ORAL
  Filled 2014-06-27: qty 1

## 2014-06-27 MED ORDER — IBUPROFEN 800 MG PO TABS: 800.0000 mg | ORAL_TABLET | Freq: Three times a day (TID) | ORAL | Status: DC | PRN

## 2014-06-27 NOTE — Discharge Instructions (Signed)
Shoulder Pain The shoulder is the joint that connects your arms to your body. The bones that form the shoulder joint include the upper arm bone (humerus), the shoulder blade (scapula), and the collarbone (clavicle). The top of the humerus is shaped like a ball and fits into a rather flat socket on the scapula (glenoid cavity). A combination of muscles and strong, fibrous tissues that connect muscles to bones (tendons) support your shoulder joint and hold the ball in the socket. Small, fluid-filled sacs (bursae) are located in different areas of the joint. They act as cushions between the bones and the overlying soft tissues and help reduce friction between the gliding tendons and the bone as you move your arm. Your shoulder joint allows a wide range of motion in your arm. This range of motion allows you to do things like scratch your back or throw a ball. However, this range of motion also makes your shoulder more prone to pain from overuse and injury. Causes of shoulder pain can originate from both injury and overuse and usually can be grouped in the following four categories:  Redness, swelling, and pain (inflammation) of the tendon (tendinitis) or the bursae (bursitis).  Instability, such as a dislocation of the joint.  Inflammation of the joint (arthritis).  Broken bone (fracture). HOME CARE INSTRUCTIONS   Apply ice to the sore area.  Put ice in a plastic bag.  Place a towel between your skin and the bag.  Leave the ice on for 15-20 minutes, 3-4 times per day for the first 2 days, or as directed by your health care provider.  Stop using cold packs if they do not help with the pain.  If you have a shoulder sling or immobilizer, wear it as long as your caregiver instructs. Only remove it to shower or bathe. Move your arm as little as possible, but keep your hand moving to prevent swelling.  Squeeze a soft ball or foam pad as much as possible to help prevent swelling.  Only take  over-the-counter or prescription medicines for pain, discomfort, or fever as directed by your caregiver. SEEK MEDICAL CARE IF:   Your shoulder pain increases, or new pain develops in your arm, hand, or fingers.  Your hand or fingers become cold and numb.  Your pain is not relieved with medicines. SEEK IMMEDIATE MEDICAL CARE IF:   Your arm, hand, or fingers are numb or tingling.  Your arm, hand, or fingers are significantly swollen or turn white or blue. MAKE SURE YOU:   Understand these instructions.  Will watch your condition.  Will get help right away if you are not doing well or get worse. Document Released: 11/11/2004 Document Revised: 06/18/2013 Document Reviewed: 01/16/2011 [facility] Patient Information 2015 [place], [place]. This information is not intended to replace advice given to you by your health care provider. Make sure you discuss any questions you have with your health care provider.   Possible Rotator Cuff Injury Rotator cuff injury is any type of injury to the set of muscles and tendons that make up the stabilizing unit of your shoulder. This unit holds the ball of your upper arm bone (humerus) in the socket of your shoulder blade (scapula).  CAUSES Injuries to your rotator cuff most commonly come from sports or activities that cause your arm to be moved repeatedly over your head. Examples of this include throwing, weight lifting, swimming, or racquet sports. Long lasting (chronic) irritation of your rotator cuff can cause soreness and swelling (inflammation), bursitis,  and eventual damage to your tendons, such as a tear (rupture). SIGNS AND SYMPTOMS Acute rotator cuff tear:  Sudden tearing sensation followed by severe pain shooting from your upper shoulder down your arm toward your elbow.  Decreased range of motion of your shoulder because of pain and muscle spasm.  Severe pain.  Inability to raise your arm out to the side because of pain and loss of muscle power  (large tears). Chronic rotator cuff tear:  Pain that usually is worse at night and may interfere with sleep.  Gradual weakness and decreased shoulder motion as the pain worsens.  Decreased range of motion. Rotator cuff tendinitis:  Deep ache in your shoulder and the outside upper arm over your shoulder.  Pain that comes on gradually and becomes worse when lifting your arm to the side or turning it inward. DIAGNOSIS Rotator cuff injury is diagnosed through a medical history, physical exam, and imaging exam. The medical history helps determine the type of rotator cuff injury. Your health care provider will look at your injured shoulder, feel the injured area, and ask you to move your shoulder in different positions. X-ray exams typically are done to rule out other causes of shoulder pain, such as fractures. MRI is the exam of choice for the most severe shoulder injuries because the images show muscles and tendons.  TREATMENT  Chronic tear:  Medicine for pain, such as acetaminophen or ibuprofen.  Physical therapy and range-of-motion exercises may be helpful in maintaining shoulder function and strength.  Steroid injections into your shoulder joint.  Surgical repair of the rotator cuff if the injury does not heal with noninvasive treatment. Acute tear:  Anti-inflammatory medicines such as ibuprofen and naproxen to help reduce pain and swelling.  A sling to help support your arm and rest your rotator cuff muscles. Long-term use of a sling is not advised. It may cause significant stiffening of the shoulder joint.  Surgery may be considered within a few weeks, especially in younger, active people, to return the shoulder to full function.  Indications for surgical treatment include the following:  Age younger than 60 years.  Rotator cuff tears that are complete.  Physical therapy, rest, and anti-inflammatory medicines have been used for 6-8 weeks, with no improvement.  Employment or  sporting activity that requires constant shoulder use. Tendinitis:  Anti-inflammatory medicines such as ibuprofen and naproxen to help reduce pain and swelling.  A sling to help support your arm and rest your rotator cuff muscles. Long-term use of a sling is not advised. It may cause significant stiffening of the shoulder joint.  Severe tendinitis may require:  Steroid injections into your shoulder joint.  Physical therapy.  Surgery. HOME CARE INSTRUCTIONS   Apply ice to your injury:  Put ice in a plastic bag.  Place a towel between your skin and the bag.  Leave the ice on for 20 minutes, 2-3 times a day.  If you have a shoulder immobilizer (sling and straps), wear it until told otherwise by your health care provider.  You may want to sleep on several pillows or in a recliner at night to lessen swelling and pain.  Only take over-the-counter or prescription medicines for pain, discomfort, or fever as directed by your health care provider.  Do simple hand squeezing exercises with a soft rubber ball to decrease hand swelling. SEEK MEDICAL CARE IF:   Your shoulder pain increases, or new pain or numbness develops in your arm, hand, or fingers.  Your hand or  fingers are colder than your other hand. SEEK IMMEDIATE MEDICAL CARE IF:   Your arm, hand, or fingers are numb or tingling.  Your arm, hand, or fingers are increasingly swollen and painful, or they turn white or blue. MAKE SURE YOU:  Understand these instructions.  Will watch your condition.  Will get help right away if you are not doing well or get worse. Document Released: 01/30/2000 Document Revised: 02/06/2013 Document Reviewed: 09/13/2012 [facility] Patient Information 2015 [place], [place]. This information is not intended to replace advice given to you by your health care provider. Make sure you discuss any questions you have with your health care provider.   RICE: Routine Care for Injuries The routine care of  many injuries includes Rest, Ice, Compression, and Elevation (RICE). HOME CARE INSTRUCTIONS  Rest is needed to allow your body to heal. Routine activities can usually be resumed when comfortable. Injured tendons and bones can take up to 6 weeks to heal. Tendons are the cord-like structures that attach muscle to bone.  Ice following an injury helps keep the swelling down and reduces pain.  Put ice in a plastic bag.  Place a towel between your skin and the bag.  Leave the ice on for 15-20 minutes, 3-4 times a day, or as directed by your health care provider. Do this while awake, for the first 24 to 48 hours. After that, continue as directed by your caregiver.  Compression helps keep swelling down. It also gives support and helps with discomfort. If an elastic bandage has been applied, it should be removed and reapplied every 3 to 4 hours. It should not be applied tightly, but firmly enough to keep swelling down. Watch fingers or toes for swelling, bluish discoloration, coldness, numbness, or excessive pain. If any of these problems occur, remove the bandage and reapply loosely. Contact your caregiver if these problems continue.  Elevation helps reduce swelling and decreases pain. With extremities, such as the arms, hands, legs, and feet, the injured area should be placed near or above the level of the heart, if possible. SEEK IMMEDIATE MEDICAL CARE IF:  You have persistent pain and swelling.  You develop redness, numbness, or unexpected weakness.  Your symptoms are getting worse rather than improving after several days. These symptoms may indicate that further evaluation or further X-rays are needed. Sometimes, X-rays may not show a small broken bone (fracture) until 1 week or 10 days later. Make a follow-up appointment with your caregiver. Ask when your X-ray results will be ready. Make sure you get your X-ray results. Document Released: 05/16/2000 Document Revised: 02/06/2013 Document  Reviewed: 07/03/2010 [facility] Patient Information 2015 [place], [place]. This information is not intended to replace advice given to you by your health care provider. Make sure you discuss any questions you have with your health care provider.

## 2014-06-27 NOTE — ED Provider Notes (Signed)
TIME SEEN: 5:50 PM  CHIEF COMPLAINT: Right shoulder pain  HPI: Patient is 44 y.o. right-hand-dominant female with history of hypertension, GERD who presents emergency department with an. States yesterday she was getting off the couch and used her right arm to push off with and felt a pop and started having pain over her right clavicle and right posterior shoulder. Pain is worse with movement and better with keeping the arm still. Denies prior history of injury to the shoulder. No numbness, tingling or focal weakness. No neck pain.  ROS: See HPI Constitutional: no fever  Eyes: no drainage  ENT: no runny nose   Cardiovascular:  no chest pain  Resp: no SOB  GI: no vomiting GU: no dysuria Integumentary: no rash  Allergy: no hives  Musculoskeletal: no leg swelling  Neurological: no slurred speech ROS otherwise negative  PAST MEDICAL HISTORY/PAST SURGICAL HISTORY:  Past Medical History  Diagnosis Date  . GERD (gastroesophageal reflux disease)   . Gastroparesis   . Hypertension   . Restless legs   . H/O echocardiogram     a. 02/2010: done for htn/tachy while pregnant - EF 55-60%, grade 1 d/dysf, mildly dilated LA.  . High cholesterol   . Atrial fibrillation with RVR     a. Dx 09/2012, spont conv to NSR.  Marland Kitchen. Sleep apnea   . Gestational diabetes   . Migraines   . Chronic lower back pain   . Anxiety   . Kidney stones   . Anemia     a. Mild with Hgb 11.2, 09/2012. UA with mod Hgb. Instructed to f/u PCP.  Marland Kitchen. Obesity   . Anxiety   . Depression   . Panic attack     MEDICATIONS:  Prior to Admission medications   Medication Sig Start Date End Date Taking? Authorizing Provider  ALPRAZolam Prudy Feeler(XANAX) 0.5 MG tablet Take 0.5 mg by mouth 2 (two) times daily. For anxiety    Historical Provider, MD  atorvastatin (LIPITOR) 10 MG tablet Take 10 mg by mouth daily.    Historical Provider, MD  buprenorphine (BUTRANS) 20 MCG/HR PTWK patch Place 20 mcg onto the skin daily as needed (Patient leaves the  patches on for max 3 days).     Historical Provider, MD  dexlansoprazole (DEXILANT) 60 MG capsule Take 60 mg by mouth 2 (two) times daily.     Historical Provider, MD  diphenhydrAMINE (BENADRYL) 50 MG tablet Take 50 mg by mouth at bedtime as needed for itching.    Historical Provider, MD  diphenoxylate-atropine (LOMOTIL) 2.5-0.025 MG per tablet Take by mouth 4 (four) times daily as needed for diarrhea or loose stools.    Historical Provider, MD  ibuprofen (ADVIL,MOTRIN) 200 MG tablet Take 400 mg by mouth 2 (two) times daily as needed. For pain    Historical Provider, MD  labetalol (NORMODYNE) 300 MG tablet Take 600 mg by mouth 2 (two) times daily.    Historical Provider, MD  pregabalin (LYRICA) 300 MG capsule Take 300 mg by mouth 2 (two) times daily. 1 supper, 2 bedtime    Historical Provider, MD  promethazine (PHENERGAN) 25 MG tablet Take 25 mg by mouth every 6 (six) hours as needed for nausea or vomiting.    Historical Provider, MD  Tapentadol HCl (NUCYNTA) 100 MG TABS Take 200 mg by mouth 3 (three) times daily.     Historical Provider, MD  tiZANidine (ZANAFLEX) 4 MG tablet Take 4 mg by mouth every 6 (six) hours as needed for muscle spasms.  Historical Provider, MD  traZODone (DESYREL) 100 MG tablet Take 300 mg by mouth at bedtime.    Historical Provider, MD    ALLERGIES:  Allergies  Allergen Reactions  . Iodides Hives  . Imitrex [Sumatriptan]     Pt states she takes imitrex injection-"sometimes it works and sometimes doesn't"  . Metoclopramide Hcl Anxiety    SOCIAL HISTORY:  History  Substance Use Topics  . Smoking status: Never Smoker   . Smokeless tobacco: Never Used  . Alcohol Use: No    FAMILY HISTORY: Family History  Problem Relation Age of Onset  . Diabetes Mother   . Hypertension Mother   . Migraines Father   . Migraines Brother     EXAM: BP 132/87 mmHg  Pulse 78  Temp(Src) 98.1 F (36.7 C) (Oral)  Resp 16  Ht 5\' 4"  (1.626 m)  Wt 210 lb (95.255 kg)  BMI  36.03 kg/m2  SpO2 99% CONSTITUTIONAL: Alert and oriented and responds appropriately to questions. Well-appearing; well-nourished HEAD: Normocephalic EYES: Conjunctivae clear, PERRL ENT: normal nose; no rhinorrhea; moist mucous membranes; pharynx without lesions noted NECK: Supple, no meningismus, no LAD  CARD: RRR; S1 and S2 appreciated; no murmurs, no clicks, no rubs, no gallops RESP: Normal chest excursion without splinting or tachypnea; breath sounds clear and equal bilaterally; no wheezes, no rhonchi, no rales, no hypoxia or respiratory distress, speaking full sentences ABD/GI: Normal bowel sounds; non-distended; soft, non-tender, no rebound, no guarding, no peritoneal signs BACK:  The back appears normal and is non-tender to palpation, there is no CVA tenderness EXT: Mild internal palpation over the right superior posterior shoulder without obvious deformity, no loss of fullness, no tenderness over the clavicle or deformity of the right clavicle, full range of motion in the shoulder patient does have pain with  Neer's testing but has normal strength of all of bilateral upper extremities and no pain with testing of the rotator cuff, Normal ROM in all joints; there is extremities are non-tender to palpation; no edema; normal capillary refill; no cyanosis, no calf tenderness or swelling but normal grip strength bilaterally with normal sensation diffusely, 2+ radial pulses bilaterally    SKIN: Normal color for age and race; warm NEURO: Moves all extremities equally, sensation to light touch intact diffusely, cranial nerves II through XII intact PSYCH: The patient's mood and manner are appropriate. Grooming and personal hygiene are appropriate.  MEDICAL DECISION MAKING: Patient here with right shoulder injury. No signs of septic arthritis on exam. X-ray shows no acute abnormality. I will discharge the patient home with orthopedic follow-up information, prescription for ibuprofen and Vicodin to take  as needed. Instructed patient to rest, elevate and apply ice to her shoulder. Discussed return precautions. She verbalizes understanding and is comfortable with plan.       Layla MawKristen N Ward, DO 06/27/14 1840

## 2014-11-14 ENCOUNTER — Encounter (HOSPITAL_COMMUNITY): Payer: Self-pay | Admitting: *Deleted

## 2014-11-14 ENCOUNTER — Emergency Department (HOSPITAL_COMMUNITY)
Admission: EM | Admit: 2014-11-14 | Discharge: 2014-11-15 | Disposition: A | Discharge status: Other Acute Inpatient Hospital | Payer: Medicaid Other | Attending: Emergency Medicine | Admitting: Emergency Medicine

## 2014-11-14 DIAGNOSIS — F332 Major depressive disorder, recurrent severe without psychotic features: Secondary | ICD-10-CM | POA: Diagnosis not present

## 2014-11-14 DIAGNOSIS — Z79899 Other long term (current) drug therapy: Secondary | ICD-10-CM | POA: Diagnosis not present

## 2014-11-14 DIAGNOSIS — Z8632 Personal history of gestational diabetes: Secondary | ICD-10-CM | POA: Diagnosis not present

## 2014-11-14 DIAGNOSIS — F1510 Other stimulant abuse, uncomplicated: Secondary | ICD-10-CM | POA: Insufficient documentation

## 2014-11-14 DIAGNOSIS — K219 Gastro-esophageal reflux disease without esophagitis: Secondary | ICD-10-CM | POA: Insufficient documentation

## 2014-11-14 DIAGNOSIS — E669 Obesity, unspecified: Secondary | ICD-10-CM | POA: Insufficient documentation

## 2014-11-14 DIAGNOSIS — F419 Anxiety disorder, unspecified: Secondary | ICD-10-CM | POA: Diagnosis not present

## 2014-11-14 DIAGNOSIS — Z87442 Personal history of urinary calculi: Secondary | ICD-10-CM | POA: Diagnosis not present

## 2014-11-14 DIAGNOSIS — I10 Essential (primary) hypertension: Secondary | ICD-10-CM | POA: Diagnosis not present

## 2014-11-14 DIAGNOSIS — Z862 Personal history of diseases of the blood and blood-forming organs and certain disorders involving the immune mechanism: Secondary | ICD-10-CM | POA: Insufficient documentation

## 2014-11-14 DIAGNOSIS — R45851 Suicidal ideations: Secondary | ICD-10-CM | POA: Diagnosis present

## 2014-11-14 DIAGNOSIS — G8929 Other chronic pain: Secondary | ICD-10-CM | POA: Insufficient documentation

## 2014-11-14 LAB — CBC
HCT: 34.9 % — ABNORMAL LOW (ref 36.0–46.0)
Hemoglobin: 10.3 g/dL — ABNORMAL LOW (ref 12.0–15.0)
MCH: 25.1 pg — ABNORMAL LOW (ref 26.0–34.0)
MCHC: 29.5 g/dL — ABNORMAL LOW (ref 30.0–36.0)
MCV: 84.9 fL (ref 78.0–100.0)
PLATELETS: 168 10*3/uL (ref 150–400)
RBC: 4.11 MIL/uL (ref 3.87–5.11)
RDW: 15.2 % (ref 11.5–15.5)
WBC: 4.6 10*3/uL (ref 4.0–10.5)

## 2014-11-14 LAB — COMPREHENSIVE METABOLIC PANEL
ALT: 20 U/L (ref 14–54)
ANION GAP: 6 (ref 5–15)
AST: 21 U/L (ref 15–41)
Albumin: 3.9 g/dL (ref 3.5–5.0)
Alkaline Phosphatase: 39 U/L (ref 38–126)
BILIRUBIN TOTAL: 0.5 mg/dL (ref 0.3–1.2)
BUN: 13 mg/dL (ref 6–20)
CO2: 29 mmol/L (ref 22–32)
Calcium: 9.1 mg/dL (ref 8.9–10.3)
Chloride: 103 mmol/L (ref 101–111)
Creatinine, Ser: 1.16 mg/dL — ABNORMAL HIGH (ref 0.44–1.00)
GFR calc Af Amer: >60 mL/min (ref >60)
GFR calc non Af Amer: 57 mL/min — ABNORMAL LOW (ref >60)
Glucose, Bld: 101 mg/dL — ABNORMAL HIGH (ref 65–99)
Potassium: 4.1 mmol/L (ref 3.5–5.1)
Sodium: 138 mmol/L (ref 135–145)
TOTAL PROTEIN: 7 g/dL (ref 6.5–8.1)

## 2014-11-14 LAB — RAPID URINE DRUG SCREEN, HOSP PERFORMED
AMPHETAMINES: POSITIVE — AB
BARBITURATES: NOT DETECTED
BENZODIAZEPINES: NOT DETECTED
Cocaine: NOT DETECTED
Opiates: NOT DETECTED
Tetrahydrocannabinol: NOT DETECTED

## 2014-11-14 LAB — ETHANOL: Alcohol, Ethyl (B): <5 mg/dL (ref <5)

## 2014-11-14 LAB — ACETAMINOPHEN LEVEL: Acetaminophen (Tylenol), Serum: <10 ug/mL — ABNORMAL LOW (ref 10–30)

## 2014-11-14 LAB — SALICYLATE LEVEL

## 2014-11-14 MED ORDER — ALUM & MAG HYDROXIDE-SIMETH 200-200-20 MG/5ML PO SUSP
30.0000 mL | ORAL | Status: DC | PRN
Start: 2014-11-14 — End: 2014-11-15
  Administered 2014-11-15: 30 mL via ORAL
  Filled 2014-11-14: qty 30

## 2014-11-14 MED ORDER — ONDANSETRON HCL 4 MG PO TABS: 4.0000 mg | ORAL_TABLET | Freq: Three times a day (TID) | ORAL | Status: DC | PRN

## 2014-11-14 MED ORDER — ACETAMINOPHEN 325 MG PO TABS
650.0000 mg | ORAL_TABLET | ORAL | Status: DC | PRN
  Administered 2014-11-15: 650 mg via ORAL
  Filled 2014-11-14: qty 2

## 2014-11-14 MED ORDER — IBUPROFEN 200 MG PO TABS: 600.0000 mg | ORAL_TABLET | Freq: Three times a day (TID) | ORAL | Status: DC | PRN

## 2014-11-14 MED ORDER — LORAZEPAM 1 MG PO TABS
1.0000 mg | ORAL_TABLET | Freq: Three times a day (TID) | ORAL | Status: DC | PRN
  Administered 2014-11-15: 1 mg via ORAL
  Filled 2014-11-14: qty 1

## 2014-11-14 NOTE — ED Notes (Addendum)
Per ems pt is homeless, pt was kicked out of [address] today. Pt reports at 1000 pt took 8 trazodone, 2 phenergan, and 8 tizanidine in a SI attempt. Pt reports she also on Tuesday 9/27 at 1100 and 2200, 8 trazodone, 2 phenergan, and 8 tizanidine and Wednesday 9/28 at 1100 and 2200, 8 trazodone, 2 phenergan, and 8 tizanidine.   Pt c/o dizziness, reports she has been "sleeping a lot". Pt reports it is typical for her to be dizzy when taking phenergan. Pt alert and oriented x4, no lethargy noted. Denies pain.   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 In past had taken psych medications, but stopped them completely 2 weeks ago because she did not want to take them anymore. Denies HI, AH/VH. Reports chronic back pain 6/10. Pt will not contract for safety.   Nurse checked dosages of meds Promethazine 25 mg tablet  Take 1 tablet by mouth as needed every 6-8 hours Tizanidine  tablet  Take 1 tablet 4 times a day tranzodone  tablet take 3 tablets at bedtime

## 2014-11-14 NOTE — ED Notes (Signed)
Attempted to obtain labs unsuccessful x 2

## 2014-11-14 NOTE — ED Notes (Signed)
Pt personal belongings was  Given to sap unit staff

## 2014-11-14 NOTE — ED Notes (Signed)
3 pt belonging bags and 1 blue bag at nurses station near room 12

## 2014-11-14 NOTE — ED Notes (Signed)
Nurse drawing labs. 

## 2014-11-14 NOTE — ED Notes (Signed)
Poison control called, if pts blood work and EKG is normal, then pt is cleared by poison control.

## 2014-11-14 NOTE — BH Assessment (Signed)
Consulted with Gray Bernhardt, NP who recommends inpatient at this time. Informed EDP of disposition.   Davina Poke, LCSW Therapeutic Triage Specialist [facility] 11/14/2014 6:18 PM

## 2014-11-14 NOTE — ED Notes (Signed)
All belongings signed for and stored in hall cabinet.

## 2014-11-14 NOTE — ED Notes (Signed)
Off the floor to EKG.

## 2014-11-14 NOTE — ED Notes (Signed)
Patient currently denies SI, HI and AVH at this time. Patient does admit prior suicide attempt over they last couple of days. Patient reports that one her stressors that led up to suicide attempt was "I became homeless on Friday". Patient has a flat affect. Patient oriented to unit. Plan of care discussed. Patient voices no complaints or concerns at this time. Encouragement and support provided and safety maintain. Q  15 min safety checks remain in place.

## 2014-11-14 NOTE — ED Notes (Signed)
CSW in room talking to pt, attemp another blood draw when CSW is done

## 2014-11-14 NOTE — Progress Notes (Addendum)
Referral faxed over to: [facility] - per Cathi Roan, d/c tomorrow, fax referral for review. [facility] - per Juliette Alcide and Press photographer, fax referral. [facility] - per intake, fax referral for the waitlist. [facility] - per intake, fax referral.  [facility] - left voicemail.  At capacity: [facility] - female adult only [facility] - per [place]. [facility] - per [facility] - per Terrilee Files, [place] Disposition staff 11/14/2014 8:54 PM

## 2014-11-14 NOTE — BH Assessment (Addendum)
Assessment Note  Sandra [name] is an 44 y.o. female who presents to [facility] voluntarily for suicidal ideations with a plan to overdose. Patient states that she took 8 Trazodone , 8 Tizanidine,and 2 Phrenigen  - 25 mg" Patient states that she is not aware of the dosage of Trazodone and Tizanidine. Patient states that she took the medication at around ten AM and came into the ED around 5PM. Patient states that she  has been taking this amount for the past three days and if she is released she plans to take the pills to overdose. Patient states that she is homeless and was "kicked out" of a motel earlier today. Patient states that she has been increasingly suicidal due to homelessness. Patient states that she stayed with her family but they "put me out" which is when she started staying in hotels. Patient states that she does not have a history of suicide attempts or self-injurious behavior. Patient states that she does not currently feel hopeless or helpless. Patient states that she isolates herself from others and she f eels guilt, fatigue, and has lost interest in pleasurable activities. Patient states that she "thinks" that she has a Veterinary surgeon with [employer]" by the name of Fleet Contras. Patient states that she is not sure what Fleet Contras does but she knows "that she will come see me no matter where I am and we talk." Patient denies having a psychiatrist at this time. Patient denies previous inpatient hospitalizations.  Patient states that she sees someone for anger managements through probation. Patient states that she is on probation for assault. Patient patient states that she has a group on Thursday and if she does not attend she will violate her probation and she will go to jail. Patient denies SI and AVH.  Patient states that she thinks that we can help her by "make me feel better" but states that she is not sure how that will happen.   Patient is alert and oriented x4. Patient was calm and  cooperative and sat upright in the bed during the assessment. Patient made fair eye contact and spoke logically and coherently. Patient states that she is unable to care for herself without assistance in some ways due to feeling weakness in her legs and hands. Patient states that she shakes when she tries to make a fist and she also feels weakness in her legs when she stands up for a certain period of time and feels that she may fall. Patient states that she has no complaints of concentration or memory. Patient states that her appetite is not good and she has lost 15 pounds in the past month. Patient states that she sleeps "all the time" because of the Trazodone. Patient denies history of trauma/abuse. Patient states that she does not have family support. Patient denies access to weapons and firearms. Patient states that she has court soon for custody of her children and is not sure of the exact date. Patient states that her children are with her husband and he "took them away."   Diagnosis:   Past Medical History:  Past Medical History  Diagnosis Date  . GERD (gastroesophageal reflux disease)   . Gastroparesis   . Hypertension   . Restless legs   . H/O echocardiogram     a. 02/2010: done for htn/tachy while pregnant - EF 55-60%, grade 1 d/dysf, mildly dilated LA.  . High cholesterol   . Atrial fibrillation with RVR     a. Dx 09/2012, spont  conv to NSR.  Marland Kitchen Sleep apnea   . Gestational diabetes   . Migraines   . Chronic lower back pain   . Anxiety   . Kidney stones   . Anemia     a. Mild with Hgb 11.2, 09/2012. UA with mod Hgb. Instructed to f/u PCP.  Marland Kitchen Obesity   . Anxiety   . Depression   . Panic attack     Past Surgical History  Procedure Laterality Date  . Tubal ligation  2012  . Cesarean section  2012  . Dilation and curettage of uterus  2006; 2010  . Cystoscopy/retrograde/ureteroscopy/stone extraction with basket  1987  . Cystoscopy w/ ureteral stent placement  2006  . Back  surgery      Family History:  Family History  Problem Relation Age of Onset  . Diabetes Mother   . Hypertension Mother   . Migraines Father   . Migraines Brother     Social History:  reports that she has never smoked. She has never used smokeless tobacco. She reports that she does not drink alcohol or use illicit drugs.  Additional Social History:  Alcohol / Drug Use Pain Medications: See PTA Prescriptions: See PTA Over the Counter: See PTA History of alcohol / drug use?: No history of alcohol / drug abuse  CIWA: CIWA-Ar BP: 133/85 mmHg Pulse Rate: 99 COWS:    Allergies:  Allergies  Allergen Reactions  . Iodides Hives  . Imitrex [Sumatriptan]     Pt states she takes imitrex injection-"sometimes it works and sometimes doesn't"  . Metoclopramide Hcl Anxiety    Home Medications:  (Not in a hospital admission)  OB/GYN Status:  No LMP recorded.  General Assessment Data Location of Assessment: [facility] TTS Assessment: In system Is this a Tele or Face-to-Face Assessment?: Face-to-Face Is this an Initial Assessment or a Re-assessment for this encounter?: Initial Assessment Marital status: Separated Maiden name: [place] Is patient pregnant?: No Pregnancy Status: No Living Arrangements: Other (Comment) (Homeless) Admission Status: Voluntary Is patient capable of signing voluntary admission?: Yes Referral Source: Self/Family/Friend     Crisis Care Plan Living Arrangements: Other (Comment) (Homeless) Name of Psychiatrist: None Name of Therapist: Fleet Contras  Hydrographic surveyor -  seen 2 months , [place] Monday)  Education Status Is patient currently in school?: No Highest grade of school patient has completed: 2 years of college  Risk to self with the past 6 months Suicidal Ideation: Yes-Currently Present Has patient been a risk to self within the past 6 months prior to admission? : No Suicidal Intent: Yes-Currently Present Has patient had any suicidal intent  within the past 6 months prior to admission? : No Is patient at risk for suicide?: Yes Suicidal Plan?: Yes-Currently Present Has patient had any suicidal plan within the past 6 months prior to admission? : No Specify Current Suicidal Plan: Overdose on prescriptions (Trazadone, Timazeplene, Phenigren) Access to Means: Yes Specify Access to Suicidal Means: Prescribed medications What has been your use of drugs/alcohol within the last 12 months?: Denies Previous Attempts/Gestures: No How many times?: 0 Other Self Harm Risks: Denies Triggers for Past Attempts: None known Intentional Self Injurious Behavior: None Family Suicide History: No Recent stressful life event(s): Other (Comment) ("because I'm homeless") Persecutory voices/beliefs?: No Depression: Yes Substance abuse history and/or treatment for substance abuse?: No Suicide prevention information given to non-admitted patients: Not applicable  Risk to Others within the past 6 months Homicidal Ideation: No Does patient have any lifetime risk of violence toward  others beyond the six months prior to admission? : No Thoughts of Harm to Others: No Current Homicidal Intent: No Current Homicidal Plan: No Access to Homicidal Means: No Identified Victim: Denies History of harm to others?: No Assessment of Violence: None Noted Violent Behavior Description: Denies Does patient have access to weapons?: No Criminal Charges Pending?: No Does patient have a court date: Yes Court Date: 12/04/14 Otho Bellows) Is patient on probation?: Yes (Assault and tresspassing)  Psychosis Hallucinations: None noted Delusions: None noted  Mental Status Report Appearance/Hygiene: In scrubs Eye Contact: Fair Motor Activity: Unable to assess Speech: Logical/coherent Level of Consciousness: Alert Mood: Pleasant Affect: Appropriate to circumstance Anxiety Level: None Thought Processes: Coherent, Relevant Judgement: Unimpaired Orientation: Person, Place,  Time, Situation, Appropriate for developmental age Obsessive Compulsive Thoughts/Behaviors: None  Cognitive Functioning Concentration: Normal Memory: Recent Intact, Remote Intact IQ: Average Insight: Fair Impulse Control: Fair Appetite: Poor Weight Loss: 15 Total Hours of Sleep:  ("all the time") Vegetative Symptoms: None  ADLScreening [facility] Services) Patient's cognitive ability adequate to safely complete daily activities?: Yes Patient able to express need for assistance with ADLs?: Yes Independently performs ADLs?: No  Prior Inpatient Therapy Prior Inpatient Therapy: No Prior Therapy Dates: Denies Prior Therapy Facilty/Provider(s): Denies Reason for Treatment: Denies  Prior Outpatient Therapy Prior Outpatient Therapy: Yes Prior Therapy Dates: Present Prior Therapy Facilty/Provider(s): [facility]  Does patient have an ACCT team?: No Does patient have Intensive In-House Services?  : No Does patient have [facility] services? : No Does patient have P4CC services?: No  ADL Screening (condition at time of admission) Patient's cognitive ability adequate to safely complete daily activities?: Yes Is the patient deaf or have difficulty hearing?: No Does the patient have difficulty seeing, even when wearing glasses/contacts?: No Does the patient have difficulty concentrating, remembering, or making decisions?: No Patient able to express need for assistance with ADLs?: Yes Does the patient have difficulty dressing or bathing?: No Independently performs ADLs?: No Grooming: Needs assistance Bathing: Needs assistance In/Out Bed: Needs assistance Walks in Home: Needs assistance Does the patient have difficulty walking or climbing stairs?: No Weakness of Legs: Both Weakness of Arms/Hands: Both  Home Assistive Devices/Equipment Home Assistive Devices/Equipment: None    Abuse/Neglect Assessment (Assessment to be complete while patient is alone) Physical Abuse:  Denies Verbal Abuse: Denies Sexual Abuse: Denies Exploitation of patient/patient's resources: Denies Self-Neglect: Denies Values / Beliefs Cultural Requests During Hospitalization: None Spiritual Requests During Hospitalization: None Consults Spiritual Care Consult Needed: No Social Work Consult Needed: No Merchant navy officer (For Healthcare) Does patient have an advance directive?: No Would patient like information on creating an advanced directive?: No - patient declined information    Additional Information 1:1 In Past 12 Months?: No CIRT Risk: No Elopement Risk: No Does patient have medical clearance?: Yes     Disposition:  Disposition Initial Assessment Completed for this Encounter: Yes  On Site Evaluation by:   Reviewed with Physician:    JoVea Herbin 11/14/2014 6:49 PM

## 2014-11-14 NOTE — ED Notes (Signed)
Patient has a bottle of medication in pharmacy.

## 2014-11-14 NOTE — ED Notes (Signed)
Deborah from poison control reports that patient is medically clear.

## 2014-11-14 NOTE — ED Provider Notes (Signed)
CSN: [id number]     Arrival date & time 11/14/14  1707 History   First MD Initiated Contact with Patient 11/14/14 1713     Chief Complaint  Patient presents with  . Ingestion  . Suicidal    HPI   Sandra [name] is a 44 y.o. female with a PMH of GERD, hypertension, hyperlipidemia, migraines, anxiety, depression who presents to the ED with suicidal ideation and ingestion. She states she was kicked out of her mother's home on Friday, and has been homeless since that time. She states she is "tired of being homeless," and for the last 3 days, she has taken 8 trazodone, 8 tizanidine, and 2 phenergan twice a day. She states when she takes these medications, she "passes out." She denies injury. She states her last ingestion was this morning around 10 or 11 AM. She reports worsening depression and anxiety. She denies visual or auditory hallucinations. She reports she ingested these medications in an attempt to kill herself, and states that she will continue to try to harm herself if she is discharged. She states she sees a counselor weekly for her depression and anxiety. She is supposed to be taking psych medications, but has not taken anything in the last 2 weeks. She denies headache, lightheadedness, dizziness, loss of consciousness, hitting her head, fall, chest pain, shortness of breath, abdominal pain, nausea, vomiting, fever, chills, numbness, paresthesia, weakness, alcohol use, drug use.   Past Medical History  Diagnosis Date  . GERD (gastroesophageal reflux disease)   . Gastroparesis   . Hypertension   . Restless legs   . H/O echocardiogram     a. 02/2010: done for htn/tachy while pregnant - EF 55-60%, grade 1 d/dysf, mildly dilated LA.  . High cholesterol   . Atrial fibrillation with RVR     a. Dx 09/2012, spont conv to NSR.  Marland Kitchen Sleep apnea   . Gestational diabetes   . Migraines   . Chronic lower back pain   . Anxiety   . Kidney stones   . Anemia     a. Mild with Hgb 11.2, 09/2012. UA  with mod Hgb. Instructed to f/u PCP.  Marland Kitchen Obesity   . Anxiety   . Depression   . Panic attack    Past Surgical History  Procedure Laterality Date  . Tubal ligation  2012  . Cesarean section  2012  . Dilation and curettage of uterus  2006; 2010  . Cystoscopy/retrograde/ureteroscopy/stone extraction with basket  1987  . Cystoscopy w/ ureteral stent placement  2006  . Back surgery     Family History  Problem Relation Age of Onset  . Diabetes Mother   . Hypertension Mother   . Migraines Father   . Migraines Brother    Social History  Substance Use Topics  . Smoking status: Never Smoker   . Smokeless tobacco: Never Used  . Alcohol Use: No   OB History    No data available      Review of Systems  Constitutional: Negative for fever and chills.  Eyes: Negative for visual disturbance.  Respiratory: Negative for shortness of breath.   Cardiovascular: Negative for chest pain.  Gastrointestinal: Negative for nausea, vomiting, abdominal pain, diarrhea and constipation.  Genitourinary: Negative for dysuria, urgency and frequency.  Musculoskeletal: Negative for myalgias, back pain, arthralgias, neck pain and neck stiffness.  Skin: Negative for color change, pallor, rash and wound.  Neurological: Negative for dizziness, syncope, weakness, light-headedness, numbness and headaches.  Psychiatric/Behavioral: Positive for  suicidal ideas. Negative for hallucinations. The patient is nervous/anxious.   All other systems reviewed and are negative.     Allergies  Iodides; Imitrex; and Metoclopramide hcl  Home Medications   Prior to Admission medications   Medication Sig Start Date End Date Taking? Authorizing Provider  ALPRAZolam Prudy Feeler) 0.5 MG tablet Take 0.5 mg by mouth 2 (two) times daily. For anxiety   Yes Historical Provider, MD  buprenorphine (BUTRANS) 20 MCG/HR PTWK patch Place 20 mcg onto the skin daily as needed (Patient leaves the patches on for max 3 days).    Yes Historical  Provider, MD  dexlansoprazole (DEXILANT) 60 MG capsule Take 60 mg by mouth 2 (two) times daily.    Yes Historical Provider, MD  labetalol (NORMODYNE) 300 MG tablet Take 600 mg by mouth 2 (two) times daily.   Yes Historical Provider, MD  pregabalin (LYRICA) 300 MG capsule Take 300 mg by mouth at bedtime.    Yes Historical Provider, MD  promethazine (PHENERGAN) 25 MG tablet Take 25 mg by mouth every 6 (six) hours as needed for nausea or vomiting.   Yes Historical Provider, MD  Tapentadol HCl (NUCYNTA) 100 MG TABS Take 200 mg by mouth 3 (three) times daily.    Yes Historical Provider, MD  tiZANidine (ZANAFLEX) 4 MG tablet Take 4 mg by mouth every 6 (six) hours as needed for muscle spasms.   Yes Historical Provider, MD  traZODone (DESYREL) 100 MG tablet Take 300 mg by mouth at bedtime.   Yes Historical Provider, MD  diphenhydrAMINE (BENADRYL) 50 MG tablet Take 50 mg by mouth at bedtime as needed for itching.    Historical Provider, MD  diphenoxylate-atropine (LOMOTIL) 2.5-0.025 MG per tablet Take by mouth 4 (four) times daily as needed for diarrhea or loose stools.    Historical Provider, MD  HYDROcodone-acetaminophen (NORCO/VICODIN) 5-325 MG per tablet Take 1 tablet by mouth every 4 (four) hours as needed. Patient not taking: Reported on 11/14/2014 06/27/14   Kristen N Ward, DO  ibuprofen (ADVIL,MOTRIN) 800 MG tablet Take 1 tablet (800 mg total) by mouth every 8 (eight) hours as needed for mild pain. Patient not taking: Reported on 11/14/2014 06/27/14   Kristen N Ward, DO    BP 130/70 mmHg  Pulse 82  Temp(Src) 98.4 F (36.9 C) (Oral)  Resp 18  SpO2 97% Physical Exam  Constitutional: She is oriented to person, place, and time. She appears well-developed and well-nourished. No distress.  HENT:  Head: Normocephalic and atraumatic.  Right Ear: External ear normal.  Left Ear: External ear normal.  Nose: Nose normal.  Mouth/Throat: Uvula is midline, oropharynx is clear and moist and mucous membranes  are normal.  Eyes: Conjunctivae, EOM and lids are normal. Pupils are equal, round, and reactive to light. Right eye exhibits no discharge. Left eye exhibits no discharge. No scleral icterus.  Neck: Normal range of motion. Neck supple.  Cardiovascular: Normal rate, regular rhythm, normal heart sounds, intact distal pulses and normal pulses.   Pulmonary/Chest: Effort normal and breath sounds normal. No respiratory distress. She has no wheezes. She has no rales. She exhibits no tenderness.  Abdominal: Soft. Normal appearance and bowel sounds are normal. She exhibits no distension and no mass. There is no tenderness. There is no rigidity, no rebound and no guarding.  Musculoskeletal: Normal range of motion. She exhibits no edema or tenderness.  Neurological: She is alert and oriented to person, place, and time. She has normal strength. No cranial nerve deficit or sensory  deficit.  Skin: Skin is warm, dry and intact. No rash noted. She is not diaphoretic. No erythema. No pallor.  Psychiatric: Her speech is normal and behavior is normal. She exhibits a depressed mood. She expresses suicidal ideation. She expresses suicidal plans.  Nursing note and vitals reviewed.   ED Course  Procedures (including critical care time)  Labs Review Labs Reviewed  COMPREHENSIVE METABOLIC PANEL - Abnormal; Notable for the following:    Glucose, Bld 101 (*)    Creatinine, Ser 1.16 (*)    GFR calc non Af Amer 57 (*)    All other components within normal limits  ACETAMINOPHEN LEVEL - Abnormal; Notable for the following:    Acetaminophen (Tylenol), Serum <10 (*)    All other components within normal limits  CBC - Abnormal; Notable for the following:    Hemoglobin 10.3 (*)    HCT 34.9 (*)    MCH 25.1 (*)    MCHC 29.5 (*)    All other components within normal limits  URINE RAPID DRUG SCREEN, HOSP PERFORMED - Abnormal; Notable for the following:    Amphetamines POSITIVE (*)    All other components within normal  limits  ETHANOL  SALICYLATE LEVEL    Imaging Review No results found.   I have personally reviewed and evaluated these lab results as part of my medical decision-making.   EKG Interpretation   Date/Time:  Thursday November 14 2014 18:46:54 EDT Ventricular Rate:  83 PR Interval:  127 QRS Duration: 84 QT Interval:  387 QTC Calculation: 455 R Axis:   36 Text Interpretation:  Sinus rhythm No significant change since last  tracing Confirmed by Juleen China  MD, STEPHEN ([id number]) on 11/14/2014 10:11:10 PM      MDM   Final diagnoses:  Suicidal ideation    43 year old female presents with reported ingestion and suicidal ideation. She states she has taken 8 trazodone, 8 tizanidine, and 2 phenergan twice a day for the past 3 days. Denies visual or auditory hallucinations, HI. Denies headache, lightheadedness, dizziness, loss of consciousness, hitting her head, fall, chest pain, shortness of breath, abdominal pain, nausea, vomiting, fever, chills, numbness, paresthesia, weakness, alcohol use, drug use. She states her last ingestion was around 10 or 11 AM today.  Patient is afebrile with stable vitals. She is alert and oriented x 4. Normal neuro exam with no focal deficit. Strength and sensation intact. Patient moves all extremities without difficulty. Heart regular rate and rhythm. Lungs clear to auscultation bilaterally. No respiratory distress. Abdomen soft, nontender, nondistended.  CBC with hemoglobin 10.3. CMP with creatinine slightly elevated at 1.16. Ethanol, salicylate, acetaminophen negative. UDS positive for amphetamines. EKG with no acute ischemia, sinus rhythm. Poison control contacted, patient medically cleared.  TTS consulted. Will place in psych hold. Spoke with TTS. Patient to stay given suicidal ideation. Plan to place patient tomorrow.  BP 130/70 mmHg  Pulse 82  Temp(Src) 98.4 F (36.9 C) (Oral)  Resp 18  SpO2 97%      Mady Gemma, PA-C 11/15/14  0136  Raeford Razor, MD 11/15/14 [phone]

## 2014-11-15 ENCOUNTER — Inpatient Hospital Stay (HOSPITAL_COMMUNITY)
Admission: AD | Admit: 2014-11-15 | Discharge: 2014-11-18 | DRG: 885 | Disposition: A | Discharge status: Home / Self Care | Payer: Medicaid Other | Source: Intra-hospital | Attending: Psychiatry | Admitting: Psychiatry

## 2014-11-15 ENCOUNTER — Encounter (HOSPITAL_COMMUNITY): Payer: Self-pay | Admitting: *Deleted

## 2014-11-15 DIAGNOSIS — G2581 Restless legs syndrome: Secondary | ICD-10-CM | POA: Diagnosis present

## 2014-11-15 DIAGNOSIS — Z590 Homelessness: Secondary | ICD-10-CM | POA: Diagnosis not present

## 2014-11-15 DIAGNOSIS — E7800 Pure hypercholesterolemia, unspecified: Secondary | ICD-10-CM | POA: Diagnosis present

## 2014-11-15 DIAGNOSIS — Z8249 Family history of ischemic heart disease and other diseases of the circulatory system: Secondary | ICD-10-CM

## 2014-11-15 DIAGNOSIS — R45851 Suicidal ideations: Secondary | ICD-10-CM | POA: Diagnosis present

## 2014-11-15 DIAGNOSIS — Z833 Family history of diabetes mellitus: Secondary | ICD-10-CM

## 2014-11-15 DIAGNOSIS — F322 Major depressive disorder, single episode, severe without psychotic features: Principal | ICD-10-CM | POA: Diagnosis present

## 2014-11-15 DIAGNOSIS — K219 Gastro-esophageal reflux disease without esophagitis: Secondary | ICD-10-CM | POA: Diagnosis present

## 2014-11-15 DIAGNOSIS — I10 Essential (primary) hypertension: Secondary | ICD-10-CM | POA: Diagnosis present

## 2014-11-15 DIAGNOSIS — F332 Major depressive disorder, recurrent severe without psychotic features: Secondary | ICD-10-CM | POA: Diagnosis not present

## 2014-11-15 DIAGNOSIS — G4700 Insomnia, unspecified: Secondary | ICD-10-CM | POA: Diagnosis present

## 2014-11-15 DIAGNOSIS — F410 Panic disorder [episodic paroxysmal anxiety] without agoraphobia: Secondary | ICD-10-CM | POA: Diagnosis present

## 2014-11-15 MED ORDER — ALUM & MAG HYDROXIDE-SIMETH 200-200-20 MG/5ML PO SUSP: 30.0000 mL | ORAL | Status: DC | PRN

## 2014-11-15 MED ORDER — MAGNESIUM HYDROXIDE 400 MG/5ML PO SUSP: 30.0000 mL | Freq: Every day | ORAL | Status: DC | PRN

## 2014-11-15 MED ORDER — CLONIDINE HCL 0.1 MG PO TABS
0.1000 mg | ORAL_TABLET | Freq: Four times a day (QID) | ORAL | Status: DC
  Administered 2014-11-15: 0.1 mg via ORAL
  Filled 2014-11-15: qty 1

## 2014-11-15 MED ORDER — LOPERAMIDE HCL 2 MG PO CAPS
2.0000 mg | ORAL_CAPSULE | ORAL | Status: DC | PRN
  Administered 2014-11-16: 4 mg via ORAL
  Filled 2014-11-15: qty 2

## 2014-11-15 MED ORDER — ACETAMINOPHEN 325 MG PO TABS
650.0000 mg | ORAL_TABLET | Freq: Four times a day (QID) | ORAL | Status: DC | PRN
  Administered 2014-11-16: 650 mg via ORAL
  Filled 2014-11-15: qty 2

## 2014-11-15 MED ORDER — ONDANSETRON 4 MG PO TBDP: 4.0000 mg | ORAL_TABLET | Freq: Four times a day (QID) | ORAL | Status: DC | PRN

## 2014-11-15 MED ORDER — NAPROXEN 500 MG PO TABS
500.0000 mg | ORAL_TABLET | Freq: Two times a day (BID) | ORAL | Status: DC | PRN
  Administered 2014-11-17: 500 mg via ORAL
  Filled 2014-11-15: qty 1

## 2014-11-15 MED ORDER — CLONIDINE HCL 0.1 MG PO TABS: 0.1000 mg | ORAL_TABLET | Freq: Every day | ORAL | Status: DC

## 2014-11-15 MED ORDER — METHOCARBAMOL 500 MG PO TABS
500.0000 mg | ORAL_TABLET | Freq: Three times a day (TID) | ORAL | Status: DC | PRN
  Administered 2014-11-15: 500 mg via ORAL
  Filled 2014-11-15: qty 1

## 2014-11-15 MED ORDER — CLONIDINE HCL 0.1 MG PO TABS
0.1000 mg | ORAL_TABLET | ORAL | Status: DC
  Filled 2014-11-15 (×2): qty 1

## 2014-11-15 MED ORDER — LORAZEPAM 1 MG PO TABS: 1.0000 mg | ORAL_TABLET | Freq: Four times a day (QID) | ORAL | Status: DC | PRN

## 2014-11-15 MED ORDER — DICYCLOMINE HCL 20 MG PO TABS: 20.0000 mg | ORAL_TABLET | Freq: Four times a day (QID) | ORAL | Status: DC | PRN

## 2014-11-15 MED ORDER — HYDROXYZINE HCL 25 MG PO TABS
25.0000 mg | ORAL_TABLET | Freq: Four times a day (QID) | ORAL | Status: DC | PRN
  Administered 2014-11-15 – 2014-11-17 (×3): 25 mg via ORAL
  Filled 2014-11-15 (×3): qty 1

## 2014-11-15 MED ORDER — HYDROXYZINE HCL 25 MG PO TABS
25.0000 mg | ORAL_TABLET | Freq: Four times a day (QID) | ORAL | Status: DC | PRN
  Administered 2014-11-15: 25 mg via ORAL
  Filled 2014-11-15: qty 1

## 2014-11-15 MED ORDER — METHOCARBAMOL 500 MG PO TABS: 500.0000 mg | ORAL_TABLET | Freq: Three times a day (TID) | ORAL | Status: DC | PRN

## 2014-11-15 MED ORDER — LOPERAMIDE HCL 2 MG PO CAPS
2.0000 mg | ORAL_CAPSULE | ORAL | Status: DC | PRN
Start: 2014-11-15 — End: 2014-11-15

## 2014-11-15 MED ORDER — LORAZEPAM 1 MG PO TABS
1.0000 mg | ORAL_TABLET | Freq: Four times a day (QID) | ORAL | Status: DC | PRN
  Administered 2014-11-15 – 2014-11-16 (×2): 1 mg via ORAL
  Filled 2014-11-15: qty 2
  Filled 2014-11-15: qty 1

## 2014-11-15 MED ORDER — CLONIDINE HCL 0.1 MG PO TABS: 0.1000 mg | ORAL_TABLET | ORAL | Status: DC

## 2014-11-15 MED ORDER — NAPROXEN 500 MG PO TABS: 500.0000 mg | ORAL_TABLET | Freq: Two times a day (BID) | ORAL | Status: DC | PRN

## 2014-11-15 MED ORDER — CLONIDINE HCL 0.1 MG PO TABS
0.1000 mg | ORAL_TABLET | Freq: Four times a day (QID) | ORAL | Status: DC
  Administered 2014-11-15 – 2014-11-16 (×4): 0.1 mg via ORAL
  Filled 2014-11-15 (×9): qty 1

## 2014-11-15 MED ORDER — DICYCLOMINE HCL 20 MG PO TABS
20.0000 mg | ORAL_TABLET | Freq: Four times a day (QID) | ORAL | Status: DC | PRN
  Administered 2014-11-15: 20 mg via ORAL
  Filled 2014-11-15: qty 1

## 2014-11-15 NOTE — Consult Note (Signed)
[facility] Consult   Reason for Consult:  Suicidal attempt Referring Physician:  EDP Patient Identification: [name] MRN:  [id number] Principal Diagnosis: major depression, recurrent, severe without psychosis Diagnosis:   Patient Active Problem List   Diagnosis Date Noted  . Severe recurrent major depression without psychotic features [F33.2] 11/15/2014    Priority: High    Class: Chronic  . Altered mental status [R41.82] 01/15/2014  . Acute encephalopathy [G93.40] 02/13/2013  . Acute respiratory failure [J96.00] 02/13/2013  . Aspiration pneumonia [J69.0] 02/13/2013  . Lumbar spondylosis [M47.816] 04/21/2011  . Trochanteric bursitis [M70.60] 04/21/2011  . Myofascial pain [M79.1] 04/21/2011  . TACHYCARDIA [R00.0] 03/06/2010  . DYSPNEA [R06.02] 01/06/2010  . RESTLESS LEG SYNDROME [G25.81] 12/11/2009  . MIGRAINE HEADACHE [G43.909] 12/11/2009  . HYPERTENSION [I10] 12/11/2009  . GERD [K21.9] 12/11/2009  . GASTROPARESIS [K31.84] 12/11/2009  . DIABETES MELLITUS, GESTATIONAL, HX OF [Z87.42] 12/11/2009    Total Time spent with patient: 30 minutes  Subjective:   Sandra [name] is a 44 y.o. female patient admitted with suicidal attempts.  HPI:  Ms Sandra [name] says her former husband took custody of all her kids ( 28 of 6 kids) and she lost her house at the same time.  She stayed with her mother for 2 weeks and got kicked out and was also kicked out of the motel she stayed in and has no place to go so she was homeless for the first time, she said.  Starting Monday she started taking extra pills to try to kill herself but has not succeeded so she came here.  Currently she is still suicidal.  Says she has no options and no hope. Feels sad, does not care about anything.  Past Psychiatric History: none she says  Risk to Self: Suicidal Ideation: Yes-Currently Present Suicidal Intent: Yes-Currently Present Is patient at risk for suicide?: Yes Suicidal Plan?: Yes-Currently  Present Specify Current Suicidal Plan: Overdose on prescriptions (Trazadone, Timazeplene, Phenigren) Access to Means: Yes Specify Access to Suicidal Means: Prescribed medications What has been your use of drugs/alcohol within the last 12 months?: Denies How many times?: 0 Other Self Harm Risks: Denies Triggers for Past Attempts: None known Intentional Self Injurious Behavior: None Risk to Others: Homicidal Ideation: No Thoughts of Harm to Others: No Current Homicidal Intent: No Current Homicidal Plan: No Access to Homicidal Means: No Identified Victim: Denies History of harm to others?: No Assessment of Violence: None Noted Violent Behavior Description: Denies Does patient have access to weapons?: No Criminal Charges Pending?: No Does patient have a court date: Yes Court Date: 12/04/14 Myer Haff) Prior Inpatient Therapy: Prior Inpatient Therapy: No Prior Therapy Dates: Denies Prior Therapy Facilty/Briany Aye(s): Denies Reason for Treatment: Denies Prior Outpatient Therapy: Prior Outpatient Therapy: Yes Prior Therapy Dates: Present Prior Therapy Facilty/Ryane Canavan(s): [facility]  Reason for Treatment: Anger Management Does patient have an ACCT team?: No Does patient have Intensive In-House Services?  : No Does patient have [facility] services? : No Does patient have P4CC services?: No  Past Medical History:  Past Medical History  Diagnosis Date  . GERD (gastroesophageal reflux disease)   . Gastroparesis   . Hypertension   . Restless legs   . H/O echocardiogram     a. 02/2010: done for htn/tachy while pregnant - EF 55-60%, grade 1 d/dysf, mildly dilated LA.  . High cholesterol   . Atrial fibrillation with RVR     a. Dx 09/2012, spont conv to NSR.  Marland Kitchen Sleep apnea   .  Gestational diabetes   . Migraines   . Chronic lower back pain   . Anxiety   . Kidney stones   . Anemia     a. Mild with Hgb 11.2, 09/2012. UA with mod Hgb. Instructed to f/u PCP.  Marland Kitchen Obesity   . Anxiety    . Depression   . Panic attack     Past Surgical History  Procedure Laterality Date  . Tubal ligation  2012  . Cesarean section  2012  . Dilation and curettage of uterus  2006; 2010  . Cystoscopy/retrograde/ureteroscopy/stone extraction with basket  1987  . Cystoscopy w/ ureteral stent placement  2006  . Back surgery     Family History:  Family History  Problem Relation Age of Onset  . Diabetes Mother   . Hypertension Mother   . Migraines Father   . Migraines Brother    Family Psychiatric  History: none that she is aware of Social History:  History  Alcohol Use No     History  Drug Use No    Social History   Social History  . Marital Status: Married    Spouse Name: N/A  . Number of Children: N/A  . Years of Education: N/A   Social History Main Topics  . Smoking status: Never Smoker   . Smokeless tobacco: Never Used  . Alcohol Use: No  . Drug Use: No  . Sexual Activity: Not Asked   Other Topics Concern  . None   Social History Narrative   Separated, 6 children   Right handed   2 yr college   3-4 cans of soda         Additional Social History:    Pain Medications: See PTA Prescriptions: See PTA Over the Counter: See PTA History of alcohol / drug use?: No history of alcohol / drug abuse                     Allergies:   Allergies  Allergen Reactions  . Iodides Hives  . Imitrex [Sumatriptan]     Pt states she takes imitrex injection-"sometimes it works and sometimes doesn't"  . Metoclopramide Hcl Anxiety    Labs:  Results for orders placed or performed during the hospital encounter of 11/14/14 (from the past 48 hour(s))  Urine rapid drug screen (hosp performed) (Not at [facility])     Status: Abnormal   Collection Time: 11/14/14  5:42 PM  Result Value Ref Range   Opiates NONE DETECTED NONE DETECTED   Cocaine NONE DETECTED NONE DETECTED   Benzodiazepines NONE DETECTED NONE DETECTED   Amphetamines POSITIVE (A) NONE DETECTED    Tetrahydrocannabinol NONE DETECTED NONE DETECTED   Barbiturates NONE DETECTED NONE DETECTED    Comment:        DRUG SCREEN FOR MEDICAL PURPOSES ONLY.  IF CONFIRMATION IS NEEDED FOR ANY PURPOSE, NOTIFY LAB WITHIN 5 DAYS.        LOWEST DETECTABLE LIMITS FOR URINE DRUG SCREEN Drug Class       Cutoff (ng/mL) Amphetamine      1000 Barbiturate      200 Benzodiazepine   106 Tricyclics       269 Opiates          300 Cocaine          300 THC              50   Comprehensive metabolic panel     Status: Abnormal   Collection Time: 11/14/14  6:14 PM  Result Value Ref Range   Sodium 138 135 - 145 mmol/L   Potassium 4.1 3.5 - 5.1 mmol/L   Chloride 103 101 - 111 mmol/L   CO2 29 22 - 32 mmol/L   Glucose, Bld 101 (H) 65 - 99 mg/dL   BUN 13 6 - 20 mg/dL   Creatinine, Ser 1.16 (H) 0.44 - 1.00 mg/dL   Calcium 9.1 8.9 - 10.3 mg/dL   Total Protein 7.0 6.5 - 8.1 g/dL   Albumin 3.9 3.5 - 5.0 g/dL   AST 21 15 - 41 U/L   ALT 20 14 - 54 U/L   Alkaline Phosphatase 39 38 - 126 U/L   Total Bilirubin 0.5 0.3 - 1.2 mg/dL   GFR calc non Af Amer 57 (L) >60 mL/min   GFR calc Af Amer >60 >60 mL/min    Comment: (NOTE) The eGFR has been calculated using the CKD EPI equation. This calculation has not been validated in all clinical situations. eGFR's persistently <60 mL/min signify possible Chronic Kidney Disease.    Anion gap 6 5 - 15  CBC     Status: Abnormal   Collection Time: 11/14/14  6:14 PM  Result Value Ref Range   WBC 4.6 4.0 - 10.5 K/uL   RBC 4.11 3.87 - 5.11 MIL/uL   Hemoglobin 10.3 (L) 12.0 - 15.0 g/dL   HCT 34.9 (L) 36.0 - 46.0 %   MCV 84.9 78.0 - 100.0 fL   MCH 25.1 (L) 26.0 - 34.0 pg   MCHC 29.5 (L) 30.0 - 36.0 g/dL   RDW 15.2 11.5 - 15.5 %   Platelets 168 150 - 400 K/uL  Ethanol (ETOH)     Status: None   Collection Time: 11/14/14  6:15 PM  Result Value Ref Range   Alcohol, Ethyl (B) <5 <5 mg/dL    Comment:        LOWEST DETECTABLE LIMIT FOR SERUM ALCOHOL IS 5 mg/dL FOR MEDICAL  PURPOSES ONLY   Salicylate level     Status: None   Collection Time: 11/14/14  6:15 PM  Result Value Ref Range   Salicylate Lvl <4.5 2.8 - 30.0 mg/dL  Acetaminophen level     Status: Abnormal   Collection Time: 11/14/14  6:15 PM  Result Value Ref Range   Acetaminophen (Tylenol), Serum <10 (L) 10 - 30 ug/mL    Comment:        THERAPEUTIC CONCENTRATIONS VARY SIGNIFICANTLY. A RANGE OF 10-30 ug/mL MAY BE AN EFFECTIVE CONCENTRATION FOR MANY PATIENTS. HOWEVER, SOME ARE BEST TREATED AT CONCENTRATIONS OUTSIDE THIS RANGE. ACETAMINOPHEN CONCENTRATIONS >150 ug/mL AT 4 HOURS AFTER INGESTION AND >50 ug/mL AT 12 HOURS AFTER INGESTION ARE OFTEN ASSOCIATED WITH TOXIC REACTIONS.     Current Facility-Administered Medications  Medication Dose Route Frequency Lakeyshia Tuckerman Last Rate Last Dose  . acetaminophen (TYLENOL) tablet 650 mg  650 mg Oral Q4H PRN Marella Chimes, PA-C      . alum & mag hydroxide-simeth (MAALOX/MYLANTA) 200-200-20 MG/5ML suspension 30 mL  30 mL Oral PRN Marella Chimes, PA-C      . ibuprofen (ADVIL,MOTRIN) tablet 600 mg  600 mg Oral Q8H PRN Marella Chimes, PA-C      . ondansetron [facility]) tablet 4 mg  4 mg Oral Q8H PRN Marella Chimes, PA-C       Current Outpatient Prescriptions  Medication Sig Dispense Refill  . ALPRAZolam (XANAX) 0.5 MG tablet Take 0.5 mg by mouth 2 (two) times daily. For anxiety    .  buprenorphine (BUTRANS) 20 MCG/HR PTWK patch Place 20 mcg onto the skin daily as needed (Patient leaves the patches on for max 3 days).     Marland Kitchen dexlansoprazole (DEXILANT) 60 MG capsule Take 60 mg by mouth 2 (two) times daily.     Marland Kitchen labetalol (NORMODYNE) 300 MG tablet Take 600 mg by mouth 2 (two) times daily.    . pregabalin (LYRICA) 300 MG capsule Take 300 mg by mouth at bedtime.     . promethazine (PHENERGAN) 25 MG tablet Take 25 mg by mouth every 6 (six) hours as needed for nausea or vomiting.    . Tapentadol HCl (NUCYNTA) 100 MG TABS Take 200 mg by mouth 3  (three) times daily.     Marland Kitchen tiZANidine (ZANAFLEX) 4 MG tablet Take 4 mg by mouth every 6 (six) hours as needed for muscle spasms.    . traZODone (DESYREL) 100 MG tablet Take 300 mg by mouth at bedtime.    . diphenhydrAMINE (BENADRYL) 50 MG tablet Take 50 mg by mouth at bedtime as needed for itching.    . diphenoxylate-atropine (LOMOTIL) 2.5-0.025 MG per tablet Take by mouth 4 (four) times daily as needed for diarrhea or loose stools.    Marland Kitchen HYDROcodone-acetaminophen (NORCO/VICODIN) 5-325 MG per tablet Take 1 tablet by mouth every 4 (four) hours as needed. (Patient not taking: Reported on 11/14/2014) 15 tablet 0  . ibuprofen (ADVIL,MOTRIN) 800 MG tablet Take 1 tablet (800 mg total) by mouth every 8 (eight) hours as needed for mild pain. (Patient not taking: Reported on 11/14/2014) 30 tablet 0    Musculoskeletal: Strength & Muscle Tone: within normal limits Gait & Station: normal Patient leans: N/A  Psychiatric Specialty Exam: ROS  Blood pressure 134/82, pulse 103, temperature 97.8 F (36.6 C), temperature source Oral, resp. rate 20, SpO2 96 %.There is no weight on file to calculate BMI.  General Appearance: Casual  Eye Contact::  Good  Speech:  Clear and Coherent  Volume:  Normal  Mood:  Depressed  Affect:  Congruent  Thought Process:  Coherent and Logical  Orientation:  Full (Time, Place, and Person)  Thought Content:  Negative  Suicidal Thoughts:  Yes.  with intent/plan  Homicidal Thoughts:  No  Memory:  Immediate;   Good Recent;   Good Remote;   Good  Judgement:  Fair  Insight:  Fair  Psychomotor Activity:  Normal  Concentration:  Good  Recall:  Good  Fund of Knowledge:Good  Language: Good  Akathisia:  Negative  Handed:  Right  AIMS (if indicated):     Assets:  Communication Skills Desire for Improvement  ADL's:  Intact  Cognition: WNL  Sleep:      Treatment Plan Summary: plan is to be admitted to inpatient for treatment of depression and suicidal threats  Disposition:  Recommend psychiatric Inpatient admission when medically cleared.  Donnelly Angelica 11/15/2014 10:35 AM

## 2014-11-15 NOTE — Progress Notes (Signed)
D Sandra [name] is a 44 yo caucasian female who is admitted to [facility] after going to [facility] cc suicidal ideation related to losing custody of her 6 children. Her story is somewhat disorganized..in that she shares she was in jail ( she doesn't say why) and her children are no longer with her . She is estranged from her exhusband and she says the fact that he has moved on with his life hurts  Her. While she shares that she has had suicidal ideations  , her affect is quite flat, depressed and  blunted but yet when she states she wants to die , she smiles broadly. 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 She says she has GERD and  high blood pressure . She says another one of her stressors is that her 2nd daughter has spina bifada, needs a kidney transplant, and according to the pt, the translplant is being held up do to family discord. She says because of her legal issues, losing custody of her children, losing her relationship with her husband and losing her home and now being homeless, she has had increasing thoughts of suicidality , which led her to begin to take " unknown" quanties of her medicines(?) , at which point she sought help at the ED. At this time she is contracting for safety, still has passive SI and is admitted to the hospital.

## 2014-11-15 NOTE — BH Assessment (Signed)
[facility] Assessment Progress Note  Per Carolanne Grumbling, MD, this pt requires psychiatric hospitalization at this time.  Thurman Coyer, RN, [facility] has assigned pt to [facility] Rm [id number].  Pt has signed Voluntary Admission and Consent for Treatment, as well as Consent to Release Information to [facility], her outpatient provider, as well as Jerel Shepherd, her probation officer.  A notification call has been placed to [facility], and signed forms have been faxed to [facility].  Pt's nurse, Lincoln Maxin, has been notified, and agrees to send original paperwork along with pt via Juel Burrow, and to call report to [phone].  Doylene Canning, MA Triage Specialist [phone]

## 2014-11-15 NOTE — Progress Notes (Signed)
Patient attended AA group.   

## 2014-11-15 NOTE — ED Notes (Signed)
Patient complains of increase anxiety and restlessness. Respirations equal and unlabored, skin warm and dry. NAD. Will medicate patient per MAR. Q 15 min safety checks remain in place.

## 2014-11-15 NOTE — Progress Notes (Addendum)
Pt d/c to [facility] as per MD's order. Pt is guarded, with poor eye contact, flat affect and depressed mood. Pt denies SI, HI, AVH and pain at this time. Pt cooperative with care, assessments and unit routines thus far. All belongings in locker 43 given to pt including medications (Lyrica 45 capsules) picked up from pharmacy by writer prior to d/c. Vitals WNL, no physical distress noted thus far. Continued support, availability and encouragement provided to this pt. Q 15 minutes checks maintained for safety as ordered till time of d/c.

## 2014-11-16 ENCOUNTER — Encounter (HOSPITAL_COMMUNITY): Payer: Self-pay | Admitting: Psychiatry

## 2014-11-16 DIAGNOSIS — F322 Major depressive disorder, single episode, severe without psychotic features: Principal | ICD-10-CM

## 2014-11-16 MED ORDER — DULOXETINE HCL 20 MG PO CPEP
20.0000 mg | ORAL_CAPSULE | Freq: Every day | ORAL | Status: DC
  Administered 2014-11-17: 20 mg via ORAL
  Filled 2014-11-16 (×3): qty 1

## 2014-11-16 MED ORDER — GABAPENTIN 100 MG PO CAPS
100.0000 mg | ORAL_CAPSULE | Freq: Three times a day (TID) | ORAL | Status: DC
  Administered 2014-11-16 – 2014-11-18 (×6): 100 mg via ORAL
  Filled 2014-11-16 (×12): qty 1

## 2014-11-16 MED ORDER — LORAZEPAM 1 MG PO TABS
1.0000 mg | ORAL_TABLET | Freq: Four times a day (QID) | ORAL | Status: DC | PRN
  Administered 2014-11-16: 1 mg via ORAL
  Filled 2014-11-16: qty 1

## 2014-11-16 MED ORDER — TRAZODONE HCL 50 MG PO TABS
50.0000 mg | ORAL_TABLET | Freq: Every evening | ORAL | Status: DC | PRN
  Administered 2014-11-16 – 2014-11-17 (×2): 50 mg via ORAL
  Filled 2014-11-16 (×2): qty 1

## 2014-11-16 MED ORDER — INFLUENZA VAC SPLIT QUAD 0.5 ML IM SUSY
0.5000 mL | PREFILLED_SYRINGE | INTRAMUSCULAR | Status: AC
  Administered 2014-11-17: 0.5 mL via INTRAMUSCULAR
  Filled 2014-11-16: qty 0.5

## 2014-11-16 MED ORDER — PANTOPRAZOLE SODIUM 40 MG PO TBEC
40.0000 mg | DELAYED_RELEASE_TABLET | Freq: Two times a day (BID) | ORAL | Status: DC
  Administered 2014-11-16 – 2014-11-18 (×4): 40 mg via ORAL
  Filled 2014-11-16 (×10): qty 1

## 2014-11-16 NOTE — Progress Notes (Signed)
D.  Pt pleasant on approach, new admission to unit this evening.  Denies SI/HI/hallucinations at this time.  Positive for evening AA group, interacting appropriately with peers on the unit.   Beginning to notice some s/s of withdrawal at this time.  A.  Support and encouragement offered, medication given as ordered for withdrawal.  R.  Pt remains safe on the unit, will continue to monitor.

## 2014-11-16 NOTE — Tx Team (Signed)
Initial Interdisciplinary Treatment Plan   PATIENT STRESSORS: Financial difficulties Loss of place to live   PATIENT STRENGTHS: Average or above average intelligence Motivation for treatment/growth   PROBLEM LIST: Problem List/Patient Goals Date to be addressed Date deferred Reason deferred Estimated date of resolution  Depression 11/15/14     Suicidal Ideation 11/15/14     "I want to feel better"                                           DISCHARGE CRITERIA:  Adequate post-discharge living arrangements Improved stabilization in mood, thinking, and/or behavior Motivation to continue treatment in a less acute level of care Verbal commitment to aftercare and medication compliance  PRELIMINARY DISCHARGE PLAN: Outpatient therapy Placement in alternative living arrangements  PATIENT/FAMIILY INVOLVEMENT: This treatment plan has been presented to and reviewed with the patient, Sandra [name].  The patient and family have been given the opportunity to ask questions and make suggestions.  Juliann Pares 11/16/2014, 1:18 AM
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
## 2014-11-16 NOTE — B[facility] Suicide Risk Assessment (Signed)
[facility] Admission Suicide Risk Assessment   Nursing information obtained from:   patient and chart  Demographic factors:   44 year old female, 6 children, currently separated, homeless, unemployed  Current Mental Status:   see below Loss Factors:   separation, homelessness  Historical Factors:   denies prior history of suicidal attempts  Risk Reduction Factors:   resilience, sense of responsibility to children Total Time spent with patient: 45 minutes Principal Problem: Major depressive disorder, single episode, severe without psychosis (HCC) Diagnosis:   Patient Active Problem List   Diagnosis Date Noted  . Severe recurrent major depression without psychotic features [F33.2] 11/15/2014    Class: Chronic  . Major depressive disorder, single episode, severe without psychosis [F32.2] 11/15/2014  . Altered mental status [R41.82] 01/15/2014  . Acute encephalopathy [G93.40] 02/13/2013  . Acute respiratory failure [J96.00] 02/13/2013  . Aspiration pneumonia [J69.0] 02/13/2013  . Lumbar spondylosis [M47.816] 04/21/2011  . Trochanteric bursitis [M70.60] 04/21/2011  . Myofascial pain [M79.1] 04/21/2011  . TACHYCARDIA [R00.0] 03/06/2010  . DYSPNEA [R06.02] 01/06/2010  . RESTLESS LEG SYNDROME [G25.81] 12/11/2009  . MIGRAINE HEADACHE [G43.909] 12/11/2009  . HYPERTENSION [I10] 12/11/2009  . GERD [K21.9] 12/11/2009  . GASTROPARESIS [K31.84] 12/11/2009  . DIABETES MELLITUS, GESTATIONAL, HX OF [Z87.42] 12/11/2009     Continued Clinical Symptoms:  Alcohol Use Disorder Identification Test Final Score (AUDIT): 0 The "Alcohol Use Disorders Identification Test", Guidelines for Use in Primary Care, Second Edition.  World Science writer [facility]). Score between 0-7:  no or low risk or alcohol related problems. Score between 8-15:  moderate risk of alcohol related problems. Score between 16-19:  high risk of alcohol related problems. Score 20 or above:  warrants further diagnostic evaluation for  alcohol dependence and treatment.   CLINICAL FACTORS:  44 year old female, reports significant /severe psychosocial stressors. States she is separated, has not been able to see her children recently, who are living with their father, has been homeless, and is currently unemployed. States she has been depressed due to these stressors and took an overdose of prescribed medications prior to her admission. She states " I don't even know if I was trying to kill myself because I took only some pills and not all the ones I had ".  Reports significant anxiety as well . She has been on Ambien and Xanax in the past but was not taking any psychiatric medications prior to admission. Denies drug or alcohol abuse . Dx- MDD, recurrent, severe, no psychotic features.  Plan - Inpatient admission.  At this time patient not endorsing recent opiate use/ abuse , UDS negative for opiates and she is not presenting with opiate WDL symptoms- Will D/C Clonidine detox protocol. Agrees to Cymbalta trial to address depression and also help with chronic pain issues - start low dose initially to minimize side effects - 20 mgrs QDAY Agrees to Neurontin trial to address anxiety and help with chronic pain - start 100 mgrs TID    Musculoskeletal: Strength & Muscle Tone: within normal limits Gait & Station: normal Patient leans: N/A  Psychiatric Specialty Exam: Physical Exam  ROS denies chest pain, denies shortness of breath  Blood pressure 113/75, pulse 108, temperature 97.9 F (36.6 C), temperature source Oral, resp. rate 16, height  (1.626 m), weight 183 lb (83.008 kg), SpO2 100 %.Body mass index is 31.4 kg/(m^2).  General Appearance: Fairly Groomed  Patent attorney::  Good  Speech:  Normal Rate  Volume:  Normal  Mood:  Depressed  Affect:  Constricted and but reactive   Thought Process:  Linear  Orientation:  Other:  fully alert and attentive   Thought Content:  ruminative about stressors, denies hallucinations,  no delusions, not internally preocupied   Suicidal Thoughts:  Yes.  without intent/plan- at this time denies any thoughts of hurting self or of SI, and contracts for safety on the unit   Homicidal Thoughts:  No- denies any thoughts of violence towards anybody  Memory:  recent and remote grossly intact   Judgement:  Fair  Insight:  Fair  Psychomotor Activity:  Normal  Concentration:  Good  Recall:  Good  Fund of Knowledge:Good  Language: Good  Akathisia:  Negative  Handed:  Right  AIMS (if indicated):     Assets:  Communication Skills Desire for Improvement Resilience  Sleep:  Number of Hours: 6.5  Cognition: WNL  ADL's:  Intact     COGNITIVE FEATURES THAT CONTRIBUTE TO RISK:  Closed-mindedness and Loss of executive function    SUICIDE RISK:   Moderate:  Frequent suicidal ideation with limited intensity, and duration, some specificity in terms of plans, no associated intent, good self-control, limited dysphoria/symptomatology, some risk factors present, and identifiable protective factors, including available and accessible social support.  PLAN OF CARE: Patient will be admitted to inpatient psychiatric unit for stabilization and safety. Will provide and encourage milieu participation. Provide medication management and maked adjustments as needed.  Will follow daily.    Medical Decision Making:  Review of Psycho-Social Stressors (1), Review or order clinical lab tests (1), Established Problem, Worsening (2) and Review of Medication Regimen & Side Effects (2)  I certify that inpatient services furnished can reasonably be expected to improve the patient's condition.   COBOS, FERNANDO 11/16/2014, 5:42 PM

## 2014-11-16 NOTE — Progress Notes (Signed)
D . Pt pleasant on approach, brief interaction.  Denies complaints at this time.  Positive for evening AA group, interacting appropriately with peers on the unit.  Day shift RN reported good participation and sharing in group during day.  Denies SI/HI/hallucinations at this time.  A.  Support and encouragement offered  R.  Pt remains safe on unit, will continue to monitor.

## 2014-11-16 NOTE — [facility] Group Notes (Signed)
[facility] Group Notes:  (Nursing--Healthy Coping Skills)  Date:  11/16/2014  Time:  1000 Type of Therapy:  Psychoeducational Skills  Participation Level:  Active  Participation Quality:  Attentive, Sharing and Supportive  Affect:  Flat  Cognitive:  Alert and Appropriate  Insight:  Good  Engagement in Group:  Engaged  Modes of Intervention:  Discussion, Education, Exploration and Support  Summary of Progress/Problems: Pt attended groups as scheduled and was engaged throughout the session.   Ouida Sills, Cartel Mauss 11/16/2014, 1000

## 2014-11-16 NOTE — H&P (Signed)
Psychiatric Admission Assessment Adult  Patient Identification: Sandra [name] MRN:  [id number] Date of Evaluation:  11/16/2014 Chief Complaint:  MDD Recurrent,Severe Principal Diagnosis: Major depressive disorder, single episode, severe without psychosis ([place]) Diagnosis:   Patient Active Problem List   Diagnosis Date Noted  . Severe recurrent major depression without psychotic features [F33.2] 11/15/2014    Class: Chronic  . Major depressive disorder, single episode, severe without psychosis [F32.2] 11/15/2014  . Altered mental status [R41.82] 01/15/2014  . Acute encephalopathy [G93.40] 02/13/2013  . Acute respiratory failure [J96.00] 02/13/2013  . Aspiration pneumonia [J69.0] 02/13/2013  . Lumbar spondylosis [M47.816] 04/21/2011  . Trochanteric bursitis [M70.60] 04/21/2011  . Myofascial pain [M79.1] 04/21/2011  . TACHYCARDIA [R00.0] 03/06/2010  . DYSPNEA [R06.02] 01/06/2010  . RESTLESS LEG SYNDROME [G25.81] 12/11/2009  . MIGRAINE HEADACHE [G43.909] 12/11/2009  . HYPERTENSION [I10] 12/11/2009  . GERD [K21.9] 12/11/2009  . GASTROPARESIS [K31.84] 12/11/2009  . DIABETES MELLITUS, GESTATIONAL, HX OF [Z87.42] 12/11/2009   History of Present Illness:  Provided by TTS counselor and information verified below: Sandra [name] is an 44 y.o. female who presents to [facility] voluntarily for suicidal ideations with a plan to overdose. Patient states that she took 8 Trazodone , 8 Tizanidine,and 2 Phrenigen - 25 mg" Patient states that she is not aware of the dosage of Trazodone and Tizanidine. Patient states that she took the medication at around ten AM and came into the ED around [place]. Patient states that she has been taking this amount for the past three days and if she is released she plans to take the pills to overdose. Patient states that she is homeless and was "kicked out" of a motel earlier today. Patient states that she has been increasingly suicidal due to homelessness. Patient states that  she stayed with her family but they "put me out" which is when she started staying in hotels. Patient states that she does not have a history of suicide attempts or self-injurious behavior. Patient states that she does not currently feel hopeless or helpless. Patient states that she isolates herself from others and she f eels guilt, fatigue, and has lost interest in pleasurable activities. Patient states that she "thinks" that she has a Social worker with English as a second language teacher" by the name of Sandra [name].   Today, patient was seen and verified that she did take the overdose of pills.  Her emotional trigger is her current state in life, she is homeless.  She feel that if her mother can take her back she would be able to find employment and get on her feet.  She denies that she is depressed.  She dais that she has been laughing since getting here, "it is not my mood."  She denies SI HI AVH or paranoia.   Associated Signs/Symptoms: Depression Symptoms:  depressed mood, hopelessness, anxiety, (Hypo) Manic Symptoms:  Irritable Mood, Anxiety Symptoms:  Excessive Worry, Psychotic Symptoms:  NA PTSD Symptoms:  NA  Total Time spent with patient: 30 minutes  Past Psychiatric History: denies  Risk to Self: Is patient at risk for suicide?: Yes Risk to Others:   Prior Inpatient Therapy:   Prior Outpatient Therapy:    Alcohol Screening: Patient refused Alcohol Screening Tool: Yes 1. How often do you have a drink containing alcohol?: Never 9. Have you or someone else been injured as a result of your drinking?: No 10. Has a relative or friend or a doctor or another health worker been concerned about your drinking or suggested you cut down?: No  Alcohol Use Disorder Identification Test Final Score (AUDIT): 0 Brief Intervention: Patient declined brief intervention Substance Abuse History in the last 12 months:  yes Consequences of Substance Abuse: inpatient admission Previous Psychotropic Medications: Yes   Psychological Evaluations: Yes  Past Medical History:  Past Medical History  Diagnosis Date  . GERD (gastroesophageal reflux disease)   . Gastroparesis   . Hypertension   . Restless legs   . H/O echocardiogram     a. 02/2010: done for htn/tachy while pregnant - EF 55-60%, grade 1 d/dysf, mildly dilated LA.  . High cholesterol   . Atrial fibrillation with RVR     a. Dx 09/2012, spont conv to NSR.  Marland Kitchen Sleep apnea   . Gestational diabetes   . Migraines   . Chronic lower back pain   . Anxiety   . Kidney stones   . Anemia     a. Mild with Hgb 11.2, 09/2012. UA with mod Hgb. Instructed to f/u PCP.  Marland Kitchen Obesity   . Anxiety   . Depression   . Panic attack     Past Surgical History  Procedure Laterality Date  . Tubal ligation  2012  . Cesarean section  2012  . Dilation and curettage of uterus  2006; 2010  . Cystoscopy/retrograde/ureteroscopy/stone extraction with basket  1987  . Cystoscopy w/ ureteral stent placement  2006  . Back surgery     Family History:  Family History  Problem Relation Age of Onset  . Diabetes Mother   . Hypertension Mother   . Migraines Father   . Migraines Brother    Family Psychiatric  History: Family "has issues' Social History:  History  Alcohol Use No     History  Drug Use No    Social History   Social History  . Marital Status: Married    Spouse Name: N/A  . Number of Children: N/A  . Years of Education: N/A   Social History Main Topics  . Smoking status: Never Smoker   . Smokeless tobacco: Never Used  . Alcohol Use: No  . Drug Use: No  . Sexual Activity: Not Asked   Other Topics Concern  . None   Social History Narrative   Separated, 6 children   Right handed   2 yr college   3-4 cans of soda         Additional Social History:    Pain Medications: NONE Withdrawal Symptoms: Aggressive/Assaultive, Cramps, Fever / Chills, Seizures                    Allergies:   Allergies  Allergen Reactions  . Iodides Hives   . Imitrex [Sumatriptan]     Pt states she takes imitrex injection-"sometimes it works and sometimes doesn't"  . Metoclopramide Hcl Anxiety   Lab Results:  Results for orders placed or performed during the hospital encounter of 11/14/14 (from the past 48 hour(s))  Urine rapid drug screen (hosp performed) (Not at [facility])     Status: Abnormal   Collection Time: 11/14/14  5:42 PM  Result Value Ref Range   Opiates NONE DETECTED NONE DETECTED   Cocaine NONE DETECTED NONE DETECTED   Benzodiazepines NONE DETECTED NONE DETECTED   Amphetamines POSITIVE (A) NONE DETECTED   Tetrahydrocannabinol NONE DETECTED NONE DETECTED   Barbiturates NONE DETECTED NONE DETECTED    Comment:        DRUG SCREEN FOR MEDICAL PURPOSES ONLY.  IF CONFIRMATION IS NEEDED FOR ANY PURPOSE, NOTIFY LAB WITHIN  5 DAYS.        LOWEST DETECTABLE LIMITS FOR URINE DRUG SCREEN Drug Class       Cutoff (ng/mL) Amphetamine      1000 Barbiturate      200 Benzodiazepine   342 Tricyclics       876 Opiates          300 Cocaine          300 THC              50   Comprehensive metabolic panel     Status: Abnormal   Collection Time: 11/14/14  6:14 PM  Result Value Ref Range   Sodium 138 135 - 145 mmol/L   Potassium 4.1 3.5 - 5.1 mmol/L   Chloride 103 101 - 111 mmol/L   CO2 29 22 - 32 mmol/L   Glucose, Bld 101 (H) 65 - 99 mg/dL   BUN 13 6 - 20 mg/dL   Creatinine, Ser 1.16 (H) 0.44 - 1.00 mg/dL   Calcium 9.1 8.9 - 10.3 mg/dL   Total Protein 7.0 6.5 - 8.1 g/dL   Albumin 3.9 3.5 - 5.0 g/dL   AST 21 15 - 41 U/L   ALT 20 14 - 54 U/L   Alkaline Phosphatase 39 38 - 126 U/L   Total Bilirubin 0.5 0.3 - 1.2 mg/dL   GFR calc non Af Amer 57 (L) >60 mL/min   GFR calc Af Amer >60 >60 mL/min    Comment: (NOTE) The eGFR has been calculated using the CKD EPI equation. This calculation has not been validated in all clinical situations. eGFR's persistently <60 mL/min signify possible Chronic Kidney Disease.    Anion gap 6 5 - 15  CBC      Status: Abnormal   Collection Time: 11/14/14  6:14 PM  Result Value Ref Range   WBC 4.6 4.0 - 10.5 K/uL   RBC 4.11 3.87 - 5.11 MIL/uL   Hemoglobin 10.3 (L) 12.0 - 15.0 g/dL   HCT 34.9 (L) 36.0 - 46.0 %   MCV 84.9 78.0 - 100.0 fL   MCH 25.1 (L) 26.0 - 34.0 pg   MCHC 29.5 (L) 30.0 - 36.0 g/dL   RDW 15.2 11.5 - 15.5 %   Platelets 168 150 - 400 K/uL  Ethanol (ETOH)     Status: None   Collection Time: 11/14/14  6:15 PM  Result Value Ref Range   Alcohol, Ethyl (B) <5 <5 mg/dL    Comment:        LOWEST DETECTABLE LIMIT FOR SERUM ALCOHOL IS 5 mg/dL FOR MEDICAL PURPOSES ONLY   Salicylate level     Status: None   Collection Time: 11/14/14  6:15 PM  Result Value Ref Range   Salicylate Lvl <8.1 2.8 - 30.0 mg/dL  Acetaminophen level     Status: Abnormal   Collection Time: 11/14/14  6:15 PM  Result Value Ref Range   Acetaminophen (Tylenol), Serum <10 (L) 10 - 30 ug/mL    Comment:        THERAPEUTIC CONCENTRATIONS VARY SIGNIFICANTLY. A RANGE OF 10-30 ug/mL MAY BE AN EFFECTIVE CONCENTRATION FOR MANY PATIENTS. HOWEVER, SOME ARE BEST TREATED AT CONCENTRATIONS OUTSIDE THIS RANGE. ACETAMINOPHEN CONCENTRATIONS >150 ug/mL AT 4 HOURS AFTER INGESTION AND >50 ug/mL AT 12 HOURS AFTER INGESTION ARE OFTEN ASSOCIATED WITH TOXIC REACTIONS.     Metabolic Disorder Labs:  Lab Results  Component Value Date   HGBA1C 5.9* 10/04/2012   MPG 123* 10/04/2012   MPG  134* 04/20/2010   No results found for: PROLACTIN Lab Results  Component Value Date   TRIG 573* 02/13/2013    Current Medications: Current Facility-Administered Medications  Medication Dose Route Frequency Provider Last Rate Last Dose  . acetaminophen (TYLENOL) tablet 650 mg  650 mg Oral Q6H PRN Patrecia Pour, NP      . alum & mag hydroxide-simeth (MAALOX/MYLANTA) 200-200-20 MG/5ML suspension 30 mL  30 mL Oral Q4H PRN Patrecia Pour, NP      . cloNIDine (CATAPRES) tablet 0.1 mg  0.1 mg Oral QID Patrecia Pour, NP   0.1 mg at  11/16/14 1222   Followed by  . [START ON 11/17/2014] cloNIDine (CATAPRES) tablet 0.1 mg  0.1 mg Oral BH-qamhs Patrecia Pour, NP       Followed by  . [START ON 11/20/2014] cloNIDine (CATAPRES) tablet 0.1 mg  0.1 mg Oral QAC breakfast Patrecia Pour, NP      . dicyclomine (BENTYL) tablet 20 mg  20 mg Oral Q6H PRN Patrecia Pour, NP   20 mg at 11/15/14 2131  . hydrOXYzine (ATARAX/VISTARIL) tablet 25 mg  25 mg Oral Q6H PRN Patrecia Pour, NP   25 mg at 11/15/14 2130  . [START ON 11/17/2014] Influenza vac split quadrivalent PF (FLUARIX) injection 0.5 mL  0.5 mL Intramuscular Tomorrow-1000 Nicholaus Bloom, MD      . loperamide (IMODIUM) capsule 2-4 mg  2-4 mg Oral PRN Patrecia Pour, NP      . LORazepam (ATIVAN) tablet 1 mg  1 mg Oral Q6H PRN Patrecia Pour, NP   1 mg at 11/15/14 2130  . magnesium hydroxide (MILK OF MAGNESIA) suspension 30 mL  30 mL Oral Daily PRN Patrecia Pour, NP      . methocarbamol (ROBAXIN) tablet 500 mg  500 mg Oral Q8H PRN Patrecia Pour, NP   500 mg at 11/15/14 2131  . naproxen (NAPROSYN) tablet 500 mg  500 mg Oral BID PRN Patrecia Pour, NP      . ondansetron (ZOFRAN-ODT) disintegrating tablet 4 mg  4 mg Oral Q6H PRN Patrecia Pour, NP      . pantoprazole (PROTONIX) EC tablet 40 mg  40 mg Oral BID AC Kerrie Buffalo, NP       PTA Medications: Prescriptions prior to admission  Medication Sig Dispense Refill Last Dose  . ibuprofen (ADVIL,MOTRIN) 800 MG tablet Take 1 tablet (800 mg total) by mouth every 8 (eight) hours as needed for mild pain. (Patient not taking: Reported on 11/14/2014) 30 tablet 0 Unknown at Unknown time  . labetalol (NORMODYNE) 300 MG tablet Take 600 mg by mouth 2 (two) times daily.   Unknown at Unknown time    Musculoskeletal: Strength & Muscle Tone: within normal limits Gait & Station: normal Patient leans: N/A  Psychiatric Specialty Exam: Physical Exam  Vitals reviewed. Psychiatric: Her mood appears anxious. Thought content is not paranoid. She  exhibits a depressed mood. She expresses no homicidal and no suicidal ideation.    Review of Systems  All other systems reviewed and are negative.   Blood pressure 104/75, pulse 112, temperature 97.9 F (36.6 C), temperature source Oral, resp. rate 16, height 5' 4"  (1.626 m), weight 83.008 kg (183 lb), SpO2 100 %.Body mass index is 31.4 kg/(m^2).  General Appearance: Neat  Eye Contact::  Good  Speech:  Clear and Coherent  Volume:  Normal  Mood:  Anxious  Affect:  Congruent  Thought Process:  Goal Directed  Orientation:  Full (Time, Place, and Person)  Thought Content:  Rumination  Suicidal Thoughts:  No  Homicidal Thoughts:  No  Memory:  Immediate;   Fair Recent;   Fair Remote;   Fair  Judgement:  Fair  Insight:  Fair  Psychomotor Activity:  Normal  Concentration:  Good  Recall:  Good  Fund of Knowledge:Good  Language: Good  Akathisia:  Negative  Handed:  Right  AIMS (if indicated):     Assets:  Desire for Improvement Physical Health Resilience  ADL's:  Intact  Cognition: WNL  Sleep:  Number of Hours: 6.5     Treatment Plan Summary: Daily contact with patient to assess and evaluate symptoms and progress in treatment and Medication management  Observation Level/Precautions:  15 minute checks  Laboratory:  per ED  Psychotherapy:  group  Medications:  As per medlist  Consultations:  psych  Discharge Concerns:  safety  Estimated LOS:  5-7 days  Other:     I certify that inpatient services furnished can reasonably be expected to improve the patient's condition.   Freda Munro May Agustin AGNP-BC 10/1/20162:39 PM  Patient case discussed with NP and patient seen by me  Agree with NP assessment and note 44 year old female, reports significant /severe psychosocial stressors. States she is separated, has not been able to see her children recently, who are living with their father, has been homeless, and is currently unemployed. States she has been depressed due to these  stressors and took an overdose of prescribed medications prior to her admission. She states " I don't even know if I was trying to kill myself because I took only some pills and not all the ones I had ".  Reports significant anxiety as well . She has been on Ambien and Xanax in the past but was not taking any psychiatric medications prior to admission. Denies drug or alcohol abuse . Dx- MDD, recurrent, severe, no psychotic features.  Plan - Inpatient admission.  At this time patient not endorsing recent opiate use/ abuse , UDS negative for opiates and she is not presenting with opiate WDL symptoms- Will D/C Clonidine detox protocol. Agrees to Cymbalta trial to address depression and also help with chronic pain issues - start low dose initially to minimize side effects - 20 mgrs QDAY Agrees to Neurontin trial to address anxiety and help with chronic pain - start 100 mgrs TID

## 2014-11-16 NOTE — Progress Notes (Signed)
D: Pt alert and oriented to self, place, time and events leading to admission. Pt presents with flat affect and depressed mood. Denies SI, HI, AVH and pain at this time. Pt cooperative with unit routines. Compliant with medications as prescribed. Pt attended scheduled groups and was verbally interactive during group session. Pt rated her depression, hopelessness and anxiety all 0/10 on self inventory sheet.  A: 1:1 contact made with pt to conduct shift assessment. All medications administered as prescribed and verbal education done at the time. Continued support, availability and encouragement offered. Writer did contacted chaplin on call for spiritual consult as ordered and pt informed.  Q 15 minutes checks maintained for pt's safety as ordered without gestures of self harm to note at this time.  R: Pt remains safe on and off unit. Denies adverse drug reactions when assessed. Continue plan of care.

## 2014-11-16 NOTE — Progress Notes (Signed)
Patient attended AA group and participated. 

## 2014-11-16 NOTE — Tx Team (Signed)
Initial Interdisciplinary Treatment Plan   PATIENT STRESSORS: Financial difficulties Legal issue Marital or family conflict Traumatic event   PATIENT STRENGTHS: Ability for insight Active sense of humor Average or above average intelligence Capable of independent living   PROBLEM LIST: Problem List/Patient Goals Date to be addressed Date deferred Reason deferred Estimated date of resolution  Depression with SI 11/15/2014     HTN 11/15/2014     GERD 11/15/2014     " I'm a good mom"      "I enjoy being with my family"                               DISCHARGE CRITERIA:  Ability to meet basic life and health needs Adequate post-discharge living arrangements Improved stabilization in mood, thinking, and/or behavior Medical problems require only outpatient monitoring  PRELIMINARY DISCHARGE PLAN: Attend aftercare/continuing [name] group Attend PHP/IOP Placement in alternative living arrangements Return to previous living arrangement  PATIENT/FAMIILY INVOLVEMENT: This treatment plan has been presented to and reviewed with the patient, Sandra [name], and/or family member,   The patient and family have been given the opportunity to ask questions and make suggestions.  Rich Brave 11/16/2014, 8:09 AM

## 2014-11-16 NOTE — [facility] Group Notes (Signed)
[facility] Group Notes:  (Clinical Social Work)  11/16/2014     10-11AM  Summary of Progress/Problems:   The main focus of today's process group was to learn how to use a decisional balance exercise to move forward in the Stages of Change, which were described and discussed.  Patients listed needs on the whiteboard and unhealthy coping techniques often used to fill needs.  Motivational Interviewing and the whiteboard were utilized to help patients explore in depth the perceived benefits and costs of unhealthy coping techniques, as well as the  benefits and costs of replacing that with a healthy coping skills.  The patient expressed herself little in group, but did participate by listening, writing on the whiteboard and remaining alert/engaged.  Type of Therapy:  Group Therapy - Process   Participation Level:  Active  Participation Quality:  Attentive  Affect:  Blunted  Cognitive:  Alert  Insight:  Improving  Engagement in Therapy:  Engaged  Modes of Intervention:  Education, Motivational Interviewing  Ambrose Mantle, LCSW 11/16/2014, 12:32 PM

## 2014-11-16 NOTE — Plan of Care (Signed)
Problem: Alteration in mood & ability to function due to Goal: STG-Patient will comply with prescribed medication regimen (Patient will comply with prescribed medication regimen)  Outcome: Progressing Pt takes her medications as ordered when offer. Denies adverse drug reactions at this time.

## 2014-11-17 LAB — TSH: TSH: 2.223 u[IU]/mL (ref 0.350–4.500)

## 2014-11-17 MED ORDER — LORAZEPAM 1 MG PO TABS: 1.0000 mg | ORAL_TABLET | Freq: Three times a day (TID) | ORAL | Status: DC | PRN

## 2014-11-17 MED ORDER — DULOXETINE HCL 20 MG PO CPEP
40.0000 mg | ORAL_CAPSULE | Freq: Every day | ORAL | Status: DC
  Administered 2014-11-18: 40 mg via ORAL
  Filled 2014-11-17 (×3): qty 2

## 2014-11-17 MED ORDER — DULOXETINE HCL 20 MG PO CPEP
20.0000 mg | ORAL_CAPSULE | Freq: Once | ORAL | Status: AC
  Administered 2014-11-17: 20 mg via ORAL
  Filled 2014-11-17 (×2): qty 1
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
## 2014-11-17 NOTE — Plan of Care (Signed)
Problem: Alteration in mood & ability to function due to Goal: STG-Patient will attend groups Outcome: Progressing Pt did attend evening AA group     

## 2014-11-17 NOTE — Progress Notes (Signed)
[facility] MD Progress Note  11/17/2014 1:21 PM Sandra [name]  MRN:  [id number] Subjective:  Pt states: "I'm still a little anxious but not really depressed. I slept really well. I'm worried that they haven't ordered my blood pressure meds and would like to look into that."  Objective: Pt seen and chart reviewed. Pt is alert/oriented x4, calm, cooperative, and appropriate. Pt minimizes her depression, reports mild anxiety, and cites good sleep and appetite. She denies suicidal/homicidal ideation and psychosis and does not appear to responding to internal stimuli.   Principal Problem: Major depressive disorder, single episode, severe without psychosis (HCC) Diagnosis:   Patient Active Problem List   Diagnosis Date Noted  . Severe recurrent major depression without psychotic features (HCC) [F33.2] 11/15/2014    Class: Chronic  . Major depressive disorder, single episode, severe without psychosis (HCC) [F32.2] 11/15/2014  . Altered mental status [R41.82] 01/15/2014  . Acute encephalopathy [G93.40] 02/13/2013  . Acute respiratory failure (HCC) [J96.00] 02/13/2013  . Aspiration pneumonia (HCC) [J69.0] 02/13/2013  . Lumbar spondylosis [M47.816] 04/21/2011  . Trochanteric bursitis [M70.60] 04/21/2011  . Myofascial pain [M79.1] 04/21/2011  . TACHYCARDIA [R00.0] 03/06/2010  . DYSPNEA [R06.02] 01/06/2010  . RESTLESS LEG SYNDROME [G25.81] 12/11/2009  . MIGRAINE HEADACHE [G43.909] 12/11/2009  . HYPERTENSION [I10] 12/11/2009  . GERD [K21.9] 12/11/2009  . GASTROPARESIS [K31.84] 12/11/2009  . DIABETES MELLITUS, GESTATIONAL, HX OF [Z87.42] 12/11/2009   Total Time spent with patient: 15 minutes  Past Psychiatric History: SEE H&P  Past Medical History:  Past Medical History  Diagnosis Date  . GERD (gastroesophageal reflux disease)   . Gastroparesis   . Hypertension   . Restless legs   . H/O echocardiogram     a. 02/2010: done for htn/tachy while pregnant - EF 55-60%, grade 1 d/dysf, mildly dilated  LA.  . High cholesterol   . Atrial fibrillation with RVR (HCC)     a. Dx 09/2012, spont conv to NSR.  Marland Kitchen Sleep apnea   . Gestational diabetes   . Migraines   . Chronic lower back pain   . Anxiety   . Kidney stones   . Anemia     a. Mild with Hgb 11.2, 09/2012. UA with mod Hgb. Instructed to f/u PCP.  Marland Kitchen Obesity   . Anxiety   . Depression   . Panic attack     Past Surgical History  Procedure Laterality Date  . Tubal ligation  2012  . Cesarean section  2012  . Dilation and curettage of uterus  2006; 2010  . Cystoscopy/retrograde/ureteroscopy/stone extraction with basket  1987  . Cystoscopy w/ ureteral stent placement  2006  . Back surgery     Family History:  Family History  Problem Relation Age of Onset  . Diabetes Mother   . Hypertension Mother   . Migraines Father   . Migraines Brother    Family Psychiatric  History: SEE H&P Social History:  History  Alcohol Use No     History  Drug Use No    Social History   Social History  . Marital Status: Married    Spouse Name: N/A  . Number of Children: N/A  . Years of Education: N/A   Social History Main Topics  . Smoking status: Never Smoker   . Smokeless tobacco: Never Used  . Alcohol Use: No  . Drug Use: No  . Sexual Activity: Not Asked   Other Topics Concern  . None   Social History Narrative   Separated, 6 children  Right handed   2 yr college   3-4 cans of soda         Additional Social History:    Pain Medications: NONE Withdrawal Symptoms: Aggressive/Assaultive, Cramps, Fever / Chills, Seizures                    Sleep: Fair  Appetite:  Fair  Current Medications: Current Facility-Administered Medications  Medication Dose Route Frequency Provider Last Rate Last Dose  . acetaminophen (TYLENOL) tablet 650 mg  650 mg Oral Q6H PRN Charm Rings, NP   650 mg at 11/16/14 1655  . alum & mag hydroxide-simeth (MAALOX/MYLANTA) 200-200-20 MG/5ML suspension 30 mL  30 mL Oral Q4H PRN Charm Rings, NP      . DULoxetine (CYMBALTA) DR capsule 20 mg  20 mg Oral Daily [name], MD   20 mg at 11/17/14 1610  . gabapentin (NEURONTIN) capsule 100 mg  100 mg Oral TID [name], MD   100 mg at 11/17/14 1132  . hydrOXYzine (ATARAX/VISTARIL) tablet 25 mg  25 mg Oral Q6H PRN Charm Rings, NP   25 mg at 11/16/14 2126  . loperamide (IMODIUM) capsule 2-4 mg  2-4 mg Oral PRN Charm Rings, NP   4 mg at 11/16/14 1655  . LORazepam (ATIVAN) tablet 1 mg  1 mg Oral Q6H PRN [name], MD   1 mg at 11/16/14 2126  . magnesium hydroxide (MILK OF MAGNESIA) suspension 30 mL  30 mL Oral Daily PRN Charm Rings, NP      . naproxen (NAPROSYN) tablet 500 mg  500 mg Oral BID PRN Charm Rings, NP   500 mg at 11/17/14 0834  . ondansetron (ZOFRAN-ODT) disintegrating tablet 4 mg  4 mg Oral Q6H PRN Charm Rings, NP      . pantoprazole (PROTONIX) EC tablet 40 mg  40 mg Oral BID AC Adonis Brook, NP   40 mg at 11/17/14 0604  . traZODone (DESYREL) tablet 50 mg  50 mg Oral QHS PRN,MR X 1 Court Joy, PA-C   50 mg at 11/16/14 2126    Lab Results:  Results for orders placed or performed during the hospital encounter of 11/15/14 (from the past 48 hour(s))  TSH     Status: None   Collection Time: 11/17/14  6:28 AM  Result Value Ref Range   TSH 2.223 0.350 - 4.500 uIU/mL    Comment: Performed at [facility]    Physical Findings: AIMS: Facial and Oral Movements Muscles of Facial Expression: None, normal Lips and Perioral Area: None, normal Jaw: None, normal Tongue: None, normal,Extremity Movements Upper (arms, wrists, hands, fingers): None, normal Lower (legs, knees, ankles, toes): None, normal, Trunk Movements Neck, shoulders, hips: None, normal, Overall Severity Severity of abnormal movements (highest score from questions above): None, normal Incapacitation due to abnormal movements: None, normal Patient's awareness of abnormal movements (rate only patient's  report): No Awareness, Dental Status Current problems with teeth and/or dentures?: No Does patient usually wear dentures?: No  CIWA:  CIWA-Ar Total: 5 COWS:  COWS Total Score: 1  Musculoskeletal: Strength & Muscle Tone: within normal limits Gait & Station: normal Patient leans: N/A  Psychiatric Specialty Exam: Review of Systems  Psychiatric/Behavioral: Positive for depression. Negative for suicidal ideas and hallucinations. The patient is nervous/anxious and has insomnia.   All other systems reviewed and are negative.   Blood pressure 110/69, pulse 97, temperature 97.5 F (36.4  C), temperature source Oral, resp. rate 16, height  (1.626 m), weight 83.008 kg (183 lb), SpO2 100 %.Body mass index is 31.4 kg/(m^2).  General Appearance: Casual and Fairly Groomed  Patent attorney::  Good  Speech:  Clear and Coherent and Normal Rate  Volume:  Normal  Mood:  Anxious and Depressed  Affect:  Appropriate and Congruent  Thought Process:  Coherent, Goal Directed and Linear  Orientation:  Full (Time, Place, and Person)  Thought Content:  WDL  Suicidal Thoughts:  No  Homicidal Thoughts:  No  Memory:  Immediate;   Fair Recent;   Fair Remote;   Fair  Judgement:  Fair  Insight:  Fair  Psychomotor Activity:  Normal  Concentration:  Fair  Recall:  [employer] of Knowledge:Fair  Language: Fair  Akathisia:  No  Handed:    AIMS (if indicated):     Assets:  Communication Skills Desire for Improvement Resilience Social Support  ADL's:  Intact  Cognition: WNL  Sleep:  Number of Hours: 6.5   Treatment Plan Summary:  Daily contact with patient to assess and evaluate symptoms and progress in treatment  Medications:  -Continue Cymbalta daily, increase to  daily for MDD starting dose -Continue Gabapentin  tid for chronic pain and mood stabilization, consider titration tomorrow if tolerating well -Continue Vistaril  16h prn mild anxiety -Continue Ativan  po q8h prn severe  anxiety (moved from q6h to q8h)  Withrow, Everardo All, FNP-BC 11/17/2014, 1:21 PM Agree with Progress Note as above  Nehemiah Massed, MD

## 2014-11-17 NOTE — Plan of Care (Signed)
Problem: Alteration in mood; excessive anxiety as evidenced by: Goal: STG-Pt will report an absence of self-harm thoughts/actions (Patient will report an absence of self-harm thoughts or actions)  Outcome: Progressing Patient denies SI, no urges to self harm.  Problem: Diagnosis: Increased Risk For Suicide Attempt Goal: STG-Patient Will Comply With Medication Regime Outcome: Progressing Patient is med compliant.

## 2014-11-17 NOTE — B[facility] Group Notes (Signed)
[facility] Group Notes:  (Nursing/MHT/Case Management/Adjunct)  Date:  11/17/2014  Time:  0900  Type of Therapy:  Nurse Education  Participation Level:  Active  Participation Quality:  Attentive  Affect:  Appropriate  Cognitive:  Alert  Insight:  Improving  Engagement in Group:  Engaged  Modes of Intervention:  Education  Summary of Progress/Problems: Patient attended and participated in educational group.  Merian Capron [facility] 11/17/2014, 0930

## 2014-11-17 NOTE — BHH Group Notes (Signed)
[facility] Group Notes:  (Clinical Social Work)  11/17/2014  10:00-11:00AM  Summary of Progress/Problems:   The main focus of today's process group was to   1)  discuss the importance of adding supports  2)  define health supports versus unhealthy supports  3)  identify the patient's current unhealthy supports and plan how to handle them  4)  Identify the patient's current healthy supports and plan what to add.  An emphasis was placed on using counselor, doctor, therapy groups, 12-step groups, and problem-specific support groups to expand supports.    The patient expressed full comprehension of the concepts presented, and agreed that there is a need to add more supports.  The patient stated she does not know who in her life might be a healthy or unhealthy support.  She was attentive but did not contribute during group.  Type of Therapy:  Process Group with Motivational Interviewing  Participation Level:  Active  Participation Quality:  Attentive  Affect:  Anxious  Cognitive:  Appropriate  Insight:  Developing/Improving  Engagement in Therapy:  Engaged  Modes of Intervention:   Education, Support and Processing, Activity  Ambrose Mantle, LCSW 11/17/2014

## 2014-11-17 NOTE — Progress Notes (Addendum)
Patient up and visible in the milieu. Affect appropriate. Mood pleasant. Rating her depression and hopelessness both at a 0/10, anxiety at a 1/10. She is hopeful for discharge tomorrow. Did complain of low back pain and was given naproxen without relief. Refused tylenol. Medicated per orders, support and encouragement offered. Patient made high fall risk as chart indicates hx of fall prior to admit. Teaching completed and patient verbalizes understanding. Denies SI/HI and remains safe. Sandra [name]

## 2014-11-18 MED ORDER — DULOXETINE HCL 40 MG PO CPEP: 40.0000 mg | ORAL_CAPSULE | Freq: Every day | ORAL | Status: DC

## 2014-11-18 MED ORDER — HYDROXYZINE HCL 25 MG PO TABS: 25.0000 mg | ORAL_TABLET | Freq: Four times a day (QID) | ORAL | Status: DC | PRN

## 2014-11-18 MED ORDER — GABAPENTIN 100 MG PO CAPS: 100.0000 mg | ORAL_CAPSULE | Freq: Three times a day (TID) | ORAL | Status: DC

## 2014-11-18 MED ORDER — TRAZODONE HCL 50 MG PO TABS: 50.0000 mg | ORAL_TABLET | Freq: Every evening | ORAL | Status: DC | PRN

## 2014-11-18 NOTE — BHH Suicide Risk Assessment (Signed)
[facility] INPATIENT:  Family/Significant Other Suicide Prevention Education  Suicide Prevention Education:  Contact Attempts: Drucilla Schmidt (Pt's mother) [phone] has been identified by the patient as the family member/significant other with whom the patient will be residing, and identified as the person(s) who will aid the patient in the event of a mental health crisis.  With written consent from the patient, two attempts were made to provide suicide prevention education, prior to and/or following the patient's discharge.  We were unsuccessful in providing suicide prevention education.  A suicide education pamphlet was given to the patient to share with family/significant other.  Date and time of first attempt: 9:05AM on 10/3 Date and time of second attempt: 10:13AM on 10/3 (unable to leave voicemail)   Smart, Carlea Badour LCSWA 11/18/2014, 10:12 AM

## 2014-11-18 NOTE — Progress Notes (Signed)
The patient attended last evening's A.A. Meeting and was appropriate.  

## 2014-11-18 NOTE — Tx Team (Signed)
Interdisciplinary Treatment Plan Update (Adult)  Date:  11/18/2014  Time Reviewed:  8:30 AM   Progress in Treatment: Attending groups: Yes. Participating in groups:  Yes. Taking medication as prescribed:  Yes. Tolerating medication:  Yes. Family/Significant othe contact made:  Contact attempts made with pt's mother. SPE completed with pt.  Patient understands diagnosis:  Yes. and As evidenced by:  seeking treatment for SI, Overdose, depression Discussing patient identified problems/goals with staff:  Yes. Medical problems stabilized or resolved:  Yes Denies suicidal/homicidal ideation: Yes. Issues/concerns per patient self-inventory:  Other:  Discharge Plan or Barriers: Pt plans to stay with her mother at discharge and was given bus pass per her request (in chart). Pt has followup with [facility] for counseling and [facility] for med management.   Reason for Continuation of Hospitalization: none  Comments:  Sandra [name] is an 44 y.o. female who presents to [facility] voluntarily for suicidal ideations with a plan to overdose. Patient states that she took 8 Trazodone , 8 Tizanidine,and 2 Phrenigen - 25 mg" Patient states that she is not aware of the dosage of Trazodone and Tizanidine. Patient states that she took the medication at around ten AM and came into the ED around [place]. Patient states that she has been taking this amount for the past three days and if she is released she plans to take the pills to overdose. Patient states that she is homeless and was "kicked out" of a motel earlier today. Patient states that she has been increasingly suicidal due to homelessness. Patient states that she stayed with her family but they "put me out" which is when she started staying in hotels. Patient states that she does not have a history of suicide attempts or self-injurious behavior. Patient states that she does not currently feel hopeless or helpless. Patient states that she isolates  herself from others and she f eels guilt, fatigue, and has lost interest in pleasurable activities. Patient states that she "thinks" that she has a Social worker with English as a second language teacher" by the name of Apolonio Schneiders.  Her emotional trigger is her current state in life, she is homeless. She feel that if her mother can take her back she would be able to find employment and get on her feet. She denies that she is depressed. She says that she has been laughing since getting here, "it is not my mood." She denies SI HI AVH or paranoia.  Estimated length of stay:  D/c today   Additional Comments:  Patient and CSW reviewed pt's identified goals and treatment plan. Patient verbalized understanding and agreed to treatment plan. CSW reviewed [facility] "Discharge Process and Patient Involvement" Form. Pt verbalized understanding of information provided and signed form.    Review of initial/current patient goals per problem list:  1. Goal(s): Patient will participate in aftercare plan  Met: Yes   Target date: at discharge  As evidenced by: Patient will participate within aftercare plan AEB aftercare provider and housing plan at discharge being identified.  10/3: Pt plans to stay with her mother at discharge and has followup with pain clinic and has counselor at [facility].   2. Goal (s): Patient will exhibit decreased depressive symptoms and suicidal ideations.  Met: Yes    Target date: at discharge  As evidenced by: Patient will utilize self rating of depression at 3 or below and demonstrate decreased signs of depression or be deemed stable for discharge by MD.  10/3: Pt rates depression as 0/10 and presents  with pleasant mood/calm affect. Denies SI/HI/AVH.    Attendees: Patient:   11/18/2014 8:30 AM   Family:   11/18/2014 8:30 AM   Physician:  Dr. Carlton Adam, MD 11/18/2014 8:30 AM   Nursing:   Warner Mccreedy RN 11/18/2014 8:30 AM   Clinical Social Worker: Maxie Better, [place]  11/18/2014  8:30 AM   Clinical Social Worker: Peri Maris LCSWA 11/18/2014 8:30 AM   Other:  Gerline Legacy Nurse Case Manager 11/18/2014 8:30 AM   Other:  Lucinda Dell; [facility]  11/18/2014 8:30 AM   Other:   11/18/2014 8:30 AM   Other:  11/18/2014 8:30 AM   Other:  11/18/2014 8:30 AM   Other:  11/18/2014 8:30 AM    11/18/2014 8:30 AM    11/18/2014 8:30 AM    11/18/2014 8:30 AM    11/18/2014 8:30 AM    Scribe for Treatment Team:   Maxie Better, LCSWA  11/18/2014 8:30 AM

## 2014-11-18 NOTE — Progress Notes (Signed)
  [facility] Adult Case Management Discharge Plan :  Will you be returning to the same living situation after discharge:  Yes,  home At discharge, do you have transportation home?: Yes,  bus pass Do you have the ability to pay for your medications: Yes,  [place] medicaid  Release of information consent forms completed and submitted to medical records by CSW.  Patient to Follow up at: Follow-up Information    Follow up with [facility] On 11/25/2014.   Why:  Follow-up appointment on this date at 3:40PM for pain management and depression medications.   Contact information:   [address] [place]  [address] Telephone:  [phone] Fax:  [phone]      Follow up with [facility].   Why:  Clinical Social Worker left message with office in attempt to make appt. The office will call you to schedule follow-up. Please call office of they have not reached you by 10/4. Thank you.    Contact information:   [address] [place]  [address] Telephone:  [phone] Fax:  [phone]      Patient denies SI/HI: Yes,  during group/self report.    Safety Planning and Suicide Prevention discussed: Yes,  Contact attempts made with pt's mother. SPE completd with pt. Pt also given SPI pamphlet and encouraged to share information with support network, ask questions, and talk about any concerns relating to SPE.  Have you used any form of tobacco in the last 30 days? (Cigarettes, Smokeless Tobacco, Cigars, and/or Pipes): No  Has patient been referred to the Quitline?: N/A patient is not a smoker  Smart, [employer] LCSWA  11/18/2014, 10:13 AM

## 2014-11-18 NOTE — Progress Notes (Signed)
Pt states she has been fine today.  She denies SI/HI/AVH.  Her only complaint this evening was having to watch football this afternoon.  "I've been very bored today."  Pt states she may be discharged tomorrow, although she did not disclose where she would be going.  Pt attended evening group on the hall.  Pt voiced no needs or concerns during the assessment.  Support and encouragement offered.  Safety maintained with q15 minute checks.

## 2014-11-18 NOTE — Progress Notes (Signed)
Patient ID: Sandra [name], female   DOB: [birth year], 44 y.o.   MRN: [id number]  D: Patient reports she is anxious but feels ready for discharge. Denies any SI at this time.  A: Obtained all belongings, prescriptions, and follow up appointment R: Bus pass given per request
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
## 2014-11-18 NOTE — Discharge Summary (Signed)
Physician Discharge Summary Note  Patient:  Sandra [name] is an 44 y.o., female MRN:  [id number] DOB:  1970-06-13 Patient phone:  [phone] (home)  Patient address:   [address] [place] [address],  Total Time spent with patient: Greater than 30 minutes  Date of Admission:  11/15/2014 Date of Discharge: 11-18-14  Reason for Admission: Worsening symptoms of depression  Principal Problem: Major depressive disorder, single episode, severe without psychosis [facility]) Discharge Diagnoses: Patient Active Problem List   Diagnosis Date Noted  . Severe recurrent major depression without psychotic features (HCC) [F33.2] 11/15/2014    Class: Chronic  . Major depressive disorder, single episode, severe without psychosis (HCC) [F32.2] 11/15/2014  . Altered mental status [R41.82] 01/15/2014  . Acute encephalopathy [G93.40] 02/13/2013  . Acute respiratory failure (HCC) [J96.00] 02/13/2013  . Aspiration pneumonia (HCC) [J69.0] 02/13/2013  . Lumbar spondylosis [M47.816] 04/21/2011  . Trochanteric bursitis [M70.60] 04/21/2011  . Myofascial pain [M79.1] 04/21/2011  . TACHYCARDIA [R00.0] 03/06/2010  . DYSPNEA [R06.02] 01/06/2010  . RESTLESS LEG SYNDROME [G25.81] 12/11/2009  . MIGRAINE HEADACHE [G43.909] 12/11/2009  . HYPERTENSION [I10] 12/11/2009  . GERD [K21.9] 12/11/2009  . GASTROPARESIS [K31.84] 12/11/2009  . DIABETES MELLITUS, GESTATIONAL, HX OF [Z87.42] 12/11/2009   Musculoskeletal: Strength & Muscle Tone: within normal limits Gait & Station: normal Patient leans: N/A  Psychiatric Specialty Exam: Physical Exam  Psychiatric: Her speech is normal and behavior is normal. Judgment and thought content normal. Her mood appears not anxious. Her affect is not angry, not blunt, not labile and not inappropriate. Cognition and memory are normal. She does not exhibit a depressed mood.    Review of Systems  Constitutional: Negative.   HENT: Negative.   Eyes: Negative.   Respiratory:  Negative.   Cardiovascular: Negative.   Gastrointestinal: Negative.   Genitourinary: Negative.   Musculoskeletal: Negative.   Skin: Negative.   Neurological: Negative.   Endo/Heme/Allergies: Negative.   Psychiatric/Behavioral: Positive for depression (Stable). Negative for suicidal ideas, hallucinations, memory loss and substance abuse. The patient has insomnia (Stable). The patient is not nervous/anxious.     Blood pressure 119/95, pulse 105, temperature 97.7 F (36.5 C), temperature source Oral, resp. rate 16, height  (1.626 m), weight 83.008 kg (183 lb), SpO2 100 %.Body mass index is 31.4 kg/(m^2).  See Md's SRA  Have you used any form of tobacco in the last 30 days? (Cigarettes, Smokeless Tobacco, Cigars, and/or Pipes): No  Has this patient used any form of tobacco in the last 30 days? (Cigarettes, Smokeless Tobacco, Cigars, and/or Pipes) No  Past Medical History:  Past Medical History  Diagnosis Date  . GERD (gastroesophageal reflux disease)   . Gastroparesis   . Hypertension   . Restless legs   . H/O echocardiogram     a. 02/2010: done for htn/tachy while pregnant - EF 55-60%, grade 1 d/dysf, mildly dilated LA.  . High cholesterol   . Atrial fibrillation with RVR (HCC)     a. Dx 09/2012, spont conv to NSR.  Marland Kitchen Sleep apnea   . Gestational diabetes   . Migraines   . Chronic lower back pain   . Anxiety   . Kidney stones   . Anemia     a. Mild with Hgb 11.2, 09/2012. UA with mod Hgb. Instructed to f/u PCP.  Marland Kitchen Obesity   . Anxiety   . Depression   . Panic attack     Past Surgical History  Procedure Laterality Date  . Tubal ligation  2012  .  Cesarean section  2012  . Dilation and curettage of uterus  2006; 2010  . Cystoscopy/retrograde/ureteroscopy/stone extraction with basket  1987  . Cystoscopy w/ ureteral stent placement  2006  . Back surgery     Family History:  Family History  Problem Relation Age of Onset  . Diabetes Mother   . Hypertension Mother   .  Migraines Father   . Migraines Brother    Social History:  History  Alcohol Use No     History  Drug Use No    Social History   Social History  . Marital Status: Married    Spouse Name: N/A  . Number of Children: N/A  . Years of Education: N/A   Social History Main Topics  . Smoking status: Never Smoker   . Smokeless tobacco: Never Used  . Alcohol Use: No  . Drug Use: No  . Sexual Activity: Not Asked   Other Topics Concern  . None   Social History Narrative   Separated, 6 children   Right handed   2 yr college   3-4 cans of soda         Risk to Self: Is patient at risk for suicide?: Yes What has been your use of drugs/alcohol within the last 12 months?: None Risk to Others: No Prior Inpatient Therapy: Yes Prior Outpatient Therapy: Yes  Level of Care:  OP  Hospital Course:  Sandra [name] is an 44 y.o. female who presents to [facility]ED voluntarily for suicidal ideations with a plan to overdose. Patient states that she took 8 Trazodone , 8 Tizanidine,and 2 Phrenigen - 25 mg" Patient states that she is not aware of the dosage of Trazodone and Tizanidine. Patient states that she took the medication at around ten AM and came into the ED around 5PM. Patient states that she has been taking this amount for the past three days and if she is released she plans to take the pills to overdose. Patient states that she is homeless and was "kicked out" of a motel earlier today. Patient states that she has been increasingly suicidal due to homelessness. Patient states that she stayed with her family but they "put me out" which is when she started staying in hotels. Patient states that she does not have a history of suicide attempts or self-injurious behavior. Patient states that she does not currently feel hopeless or helpless.  Reiley's stay in this hospital was rather very brief. She came in to the hospital with worsening symptoms of anxiety, depression & inability to cope, leading to  suicidal thoughts & attempt by overdose. She apparently has an on going child custody case, in the process of getting a divorce, finding a job & being homeless. This led to her getting more depressed & suicidal. She was admitted to the hospital for treatments. After admission assessment, her symptoms were evaluated & noted. Medication regimen targeting those symptoms were initiated. She was also enrolled in the group counseling sessions to learn coping skills that should help her after discharge to cope better & manage her symptoms effectively to maintain mood stability. Lynlee was then medicated & discharged on; Cymbalta 40 mg for depression, Gabapentin 100 mg for agitation, Hydroxyzine 25 mg for anxiety & Trazodone 50 mg for insomnia. She presented no other significant pre-existing medical issues that required treatment & or monitoring. She tolerated her treatment regimen without any significant adverse effects & or reactions.  Heran's symptoms responded well to her treatment regimen. It is now apparent  that the combination of medication & group counseling sessions proved effective in her mood stability, even for short period of of time. She is currently being discharged to her home to follow-up care on an outpatient basis as noted below. Upon discharge, she appears much more in control of her mood & behavior than upon admission. Her symptoms were reported as significantly improved or completely resolved There are currently, no active SI plans or intent, AVH, delusional thoughts or paranoia.  Yuleidy was provided with a 7 days worth, supply samples of her [facility] discharge medications. She left [facility] with all personal belongings in no apparent distress. Transportation per city bus. [facility] assisted with bus pass.  Consults:  psychiatry  Significant Diagnostic Studies:  labs: CBC with diff, CMP, UDS, toxicology tests, U/A, results reviewed, stable  Discharge Vitals:   Blood pressure 119/95, pulse 105,  temperature 97.7 F (36.5 C), temperature source Oral, resp. rate 16, height  (1.626 m), weight 83.008 kg (183 lb), SpO2 100 %. Body mass index is 31.4 kg/(m^2). Lab Results:   Results for orders placed or performed during the hospital encounter of 11/15/14 (from the past 72 hour(s))  TSH     Status: None   Collection Time: 11/17/14  6:28 AM  Result Value Ref Range   TSH 2.223 0.350 - 4.500 uIU/mL    Comment: Performed at [facility]    Physical Findings: AIMS: Facial and Oral Movements Muscles of Facial Expression: None, normal Lips and Perioral Area: None, normal Jaw: None, normal Tongue: None, normal,Extremity Movements Upper (arms, wrists, hands, fingers): None, normal Lower (legs, knees, ankles, toes): None, normal, Trunk Movements Neck, shoulders, hips: None, normal, Overall Severity Severity of abnormal movements (highest score from questions above): None, normal Incapacitation due to abnormal movements: None, normal Patient's awareness of abnormal movements (rate only patient's report): No Awareness, Dental Status Current problems with teeth and/or dentures?: No Does patient usually wear dentures?: No  CIWA:  CIWA-Ar Total: 5 COWS:  COWS Total Score: 0  See Psychiatric Specialty Exam and Suicide Risk Assessment completed by Attending Physician prior to discharge.  Discharge destination:  Home  Is patient on multiple antipsychotic therapies at discharge:  No   Has Patient had three or more failed trials of antipsychotic monotherapy by history:  No   Recommended Plan for Multiple Antipsychotic Therapies: NA    Medication List    STOP taking these medications        ibuprofen 800 MG tablet  Commonly known as:  ADVIL,MOTRIN     labetalol 300 MG tablet  Commonly known as:  NORMODYNE      TAKE these medications      Indication   DULoxetine HCl 40 MG Cpep  Take 40 mg by mouth daily. For depression   Indication:  Major Depressive Disorder      gabapentin 100 MG capsule  Commonly known as:  NEURONTIN  Take 1 capsule (100 mg total) by mouth 3 (three) times daily. For agitation   Indication:  Agitation     hydrOXYzine 25 MG tablet  Commonly known as:  ATARAX/VISTARIL  Take 1 tablet (25 mg total) by mouth every 6 (six) hours as needed for anxiety.   Indication:  Anxiety     traZODone 50 MG tablet  Commonly known as:  DESYREL  Take 1 tablet (50 mg total) by mouth at bedtime as needed and may repeat dose one time if needed for sleep.   Indication:  Trouble Sleeping  Follow-up Information    Follow up with [facility] On 11/25/2014.   Why:  Follow-up appointment on this date at 3:40PM for pain management and depression medications.   Contact information:   [address] [place]  [address] Telephone:  [phone] Fax:  [phone]      Follow up with [facility].   Why:  Clinical Social Worker left message with office in attempt to make appt. The office will call you to schedule follow-up. Please call office of they have not reached you by 10/4. Thank you.    Contact information:   [address] [place]  [address] Telephone:  [phone] Fax:  [phone]     Follow-up recommendations:  Activity:  As tolerated Diet: As recommended by your primary care doctor. Keep all scheduled follow-up appointments as recommended.  Comments: Take all your medications as prescribed by your mental healthcare provider. Report any adverse effects and or reactions from your medicines to your outpatient provider promptly. Patient is instructed and cautioned to not engage in alcohol and or illegal drug use while on prescription medicines. In the event of worsening symptoms, patient is instructed to call the crisis hotline, 911 and or go to the nearest ED for appropriate evaluation and treatment of symptoms. Follow-up with your primary care provider for your other medical  issues, concerns and or health care needs.  . Total Discharge Time: Greater than 30 minutes  Signed: Sanjuana Kava, PMHNP, FNP-BC 11/18/2014, 10:18 AM  I personally assessed the patient and formulated the plan Madie Reno A. Dub Mikes, M.D.

## 2015-01-01 ENCOUNTER — Encounter (HOSPITAL_COMMUNITY): Payer: Self-pay | Admitting: *Deleted

## 2015-01-01 ENCOUNTER — Emergency Department (HOSPITAL_COMMUNITY)
Admission: EM | Admit: 2015-01-01 | Discharge: 2015-01-01 | Disposition: A | Discharge status: Home / Self Care | Payer: Medicaid Other | Attending: Emergency Medicine | Admitting: Emergency Medicine

## 2015-01-01 DIAGNOSIS — G8929 Other chronic pain: Secondary | ICD-10-CM | POA: Insufficient documentation

## 2015-01-01 DIAGNOSIS — Z862 Personal history of diseases of the blood and blood-forming organs and certain disorders involving the immune mechanism: Secondary | ICD-10-CM | POA: Insufficient documentation

## 2015-01-01 DIAGNOSIS — M546 Pain in thoracic spine: Secondary | ICD-10-CM | POA: Insufficient documentation

## 2015-01-01 DIAGNOSIS — Z79899 Other long term (current) drug therapy: Secondary | ICD-10-CM | POA: Diagnosis not present

## 2015-01-01 DIAGNOSIS — Z760 Encounter for issue of repeat prescription: Secondary | ICD-10-CM | POA: Diagnosis not present

## 2015-01-01 DIAGNOSIS — K219 Gastro-esophageal reflux disease without esophagitis: Secondary | ICD-10-CM | POA: Insufficient documentation

## 2015-01-01 DIAGNOSIS — E669 Obesity, unspecified: Secondary | ICD-10-CM | POA: Diagnosis not present

## 2015-01-01 DIAGNOSIS — Z87442 Personal history of urinary calculi: Secondary | ICD-10-CM | POA: Insufficient documentation

## 2015-01-01 DIAGNOSIS — Z8632 Personal history of gestational diabetes: Secondary | ICD-10-CM | POA: Diagnosis not present

## 2015-01-01 DIAGNOSIS — I10 Essential (primary) hypertension: Secondary | ICD-10-CM | POA: Diagnosis not present

## 2015-01-01 DIAGNOSIS — M545 Low back pain, unspecified: Secondary | ICD-10-CM

## 2015-01-01 DIAGNOSIS — F419 Anxiety disorder, unspecified: Secondary | ICD-10-CM | POA: Insufficient documentation

## 2015-01-01 DIAGNOSIS — E782 Mixed hyperlipidemia: Secondary | ICD-10-CM | POA: Insufficient documentation

## 2015-01-01 DIAGNOSIS — F329 Major depressive disorder, single episode, unspecified: Secondary | ICD-10-CM | POA: Insufficient documentation

## 2015-01-01 MED ORDER — KETOROLAC TROMETHAMINE 60 MG/2ML IM SOLN
60.0000 mg | Freq: Once | INTRAMUSCULAR | Status: AC
  Administered 2015-01-01: 60 mg via INTRAMUSCULAR
  Filled 2015-01-01: qty 2

## 2015-01-01 MED ORDER — HYDROXYZINE HCL 25 MG PO TABS: 25.0000 mg | ORAL_TABLET | Freq: Four times a day (QID) | ORAL | Status: DC | PRN

## 2015-01-01 MED ORDER — DEXAMETHASONE SODIUM PHOSPHATE 10 MG/ML IJ SOLN
6.0000 mg | Freq: Once | INTRAMUSCULAR | Status: AC
  Administered 2015-01-01: 6 mg via INTRAMUSCULAR
  Filled 2015-01-01: qty 1

## 2015-01-01 MED ORDER — HYDROCODONE-ACETAMINOPHEN 5-325 MG PO TABS
2.0000 | ORAL_TABLET | Freq: Once | ORAL | Status: AC
Start: 2015-01-01 — End: 2015-01-01
  Administered 2015-01-01: 2 via ORAL
  Filled 2015-01-01: qty 2

## 2015-01-01 NOTE — Discharge Instructions (Signed)

## 2015-01-01 NOTE — ED Notes (Signed)
Pt reports lower back pain and spasms, hx of same. Also requesting a prescription refill for atarax, has not missed any doses. Denies SI or HI.

## 2015-01-01 NOTE — ED Notes (Signed)
Pt. Stated, Im homeless and stay in my brothers garage but not until my brother gets home.  Given a Happy Meal and Coke.

## 2015-01-01 NOTE — ED Provider Notes (Signed)
CSN: [id number]     Arrival date & time 01/01/15  1527 History  By signing my name below, I, Evon Slack, attest that this documentation has been prepared under the direction and in the presence of Danelle Berry, PA-C. Electronically Signed: Evon Slack, ED Scribe. 01/01/2015. 6:04 PM.      Chief Complaint  Patient presents with  . Back Pain  . Medication Refill   The history is provided by the patient. No language interpreter was used.   HPI Comments: Sandra [name] is a 44 y.o. female with PMHX of chronic back pain who presents to the Emergency Department complaining of low back pain onset today that began while she was walking around, wearing her heavy backpack.  Pt rates the severity of her back pain 8/10 with radiation up her back. Pt describes the pain as a sharp spasm. Pt denies any alleviating factors. Pt states that she has tried nucenta with no relief. Pt does report previous back injury in 1999, and is reportedly in pain management. Pt states that when he back pain flares up she normally treats with rest, applying ice and heat and pain medication, with mild improvement.  Denies fever chills numbness or weakness. Pt denies any new injury or trauma to the back.  Denies HX of cancer. Deniess Hx IV drug use.   Past Medical History  Diagnosis Date  . GERD (gastroesophageal reflux disease)   . Gastroparesis   . Hypertension   . Restless legs   . H/O echocardiogram     a. 02/2010: done for htn/tachy while pregnant - EF 55-60%, grade 1 d/dysf, mildly dilated LA.  . High cholesterol   . Atrial fibrillation with RVR (HCC)     a. Dx 09/2012, spont conv to NSR.  Marland Kitchen Sleep apnea   . Gestational diabetes   . Migraines   . Chronic lower back pain   . Anxiety   . Kidney stones   . Anemia     a. Mild with Hgb 11.2, 09/2012. UA with mod Hgb. Instructed to f/u PCP.  Marland Kitchen Obesity   . Anxiety   . Depression   . Panic attack    Past Surgical History  Procedure Laterality Date  . Tubal  ligation  2012  . Cesarean section  2012  . Dilation and curettage of uterus  2006; 2010  . Cystoscopy/retrograde/ureteroscopy/stone extraction with basket  1987  . Cystoscopy w/ ureteral stent placement  2006  . Back surgery     Family History  Problem Relation Age of Onset  . Diabetes Mother   . Hypertension Mother   . Migraines Father   . Migraines Brother    Social History  Substance Use Topics  . Smoking status: Never Smoker   . Smokeless tobacco: Never Used  . Alcohol Use: No   OB History    No data available     Review of Systems  Constitutional: Negative for fever and chills.  Musculoskeletal: Positive for back pain.  Neurological: Negative for weakness and numbness.     Allergies  Iodides; Imitrex; and Metoclopramide hcl  Home Medications   Prior to Admission medications   Medication Sig Start Date End Date Taking? Authorizing Provider  buprenorphine (BUTRANS) 20 MCG/HR PTWK patch Place 20 mcg onto the skin once a week.   Yes Historical Provider, MD  dexlansoprazole (DEXILANT) 60 MG capsule Take 60 mg by mouth 2 (two) times daily.   Yes Historical Provider, MD  DULoxetine 40 MG CPEP Take 40  mg by mouth daily. For depression 11/18/14  Yes Sanjuana Kava, NP  hydrOXYzine (ATARAX/VISTARIL) 25 MG tablet Take 25 mg by mouth every 6 (six) hours as needed for anxiety.   Yes Historical Provider, MD  labetalol (NORMODYNE) 100 MG tablet Take 100 mg by mouth daily.   Yes Historical Provider, MD  pregabalin (LYRICA) 300 MG capsule Take 300 mg by mouth 3 (three) times daily.   Yes Historical Provider, MD  promethazine (PHENERGAN) 25 MG tablet Take 25 mg by mouth every 6 (six) hours as needed for nausea or vomiting.   Yes Historical Provider, MD  Tapentadol HCl (NUCYNTA) 100 MG TABS Take 700 mg by mouth daily. Patient states she takes seven of the  tablets every day per patient   Yes Historical Provider, MD  tizanidine (ZANAFLEX) 6 MG capsule Take 6 mg by mouth 4 (four)  times daily. Take 4 times every day per patient   Yes Historical Provider, MD  traZODone (DESYREL) 100 MG tablet Take 400 mg by mouth at bedtime.   Yes Historical Provider, MD  gabapentin (NEURONTIN) 100 MG capsule Take 1 capsule (100 mg total) by mouth 3 (three) times daily. For agitation Patient not taking: Reported on 01/01/2015 11/18/14   Sanjuana Kava, NP  hydrOXYzine (ATARAX/VISTARIL) 25 MG tablet Take 1 tablet (25 mg total) by mouth every 6 (six) hours as needed for anxiety. 01/01/15   Danelle Berry, PA-C  traZODone (DESYREL) 50 MG tablet Take 1 tablet (50 mg total) by mouth at bedtime as needed and may repeat dose one time if needed for sleep. Patient not taking: Reported on 01/01/2015 11/18/14   Sanjuana Kava, NP   BP 102/65 mmHg  Pulse 78  Temp(Src) 98.5 F (36.9 C) (Oral)  Resp 16  SpO2 100%   Physical Exam  Constitutional: She is oriented to person, place, and time. Vital signs are normal. She appears well-developed and well-nourished. She is cooperative.  Non-toxic appearance. She does not have a sickly appearance. No distress.  HENT:  Head: Normocephalic and atraumatic.  Right Ear: External ear normal.  Left Ear: External ear normal.  Nose: Nose normal.  Mouth/Throat: Oropharynx is clear and moist. No oropharyngeal exudate.  Eyes: Conjunctivae and EOM are normal. Pupils are equal, round, and reactive to light. Right eye exhibits no discharge. Left eye exhibits no discharge. No scleral icterus.  Neck: Normal range of motion. Neck supple. No JVD present. No tracheal deviation present.  Cardiovascular: Normal rate and regular rhythm.   Pulmonary/Chest: Effort normal and breath sounds normal. No stridor. No respiratory distress.  Musculoskeletal: Normal range of motion. She exhibits tenderness. She exhibits no edema.  TTP of low thoracic through sacral spine on spinal processes to paraspinal muscles, bilateral SI joint TTP Normal sensation to light touch in bilateral LE & UE,  normal strength bilaterally including dorsi flexion, plantar flexion and extension flexion at the knee, flexion at hips, and grip strength Normal gait.    Lymphadenopathy:    She has no cervical adenopathy.  Neurological: She is alert and oriented to person, place, and time. She has normal strength. She displays no tremor. No sensory deficit. She exhibits normal muscle tone. She displays no seizure activity. Coordination and gait normal.  Reflex Scores:      Patellar reflexes are 2+ on the right side and 2+ on the left side.      Achilles reflexes are 2+ on the right side and 2+ on the left side. Skin: Skin is warm  and dry. No rash noted. She is not diaphoretic. No erythema. No pallor.  Psychiatric: Her speech is normal. Judgment and thought content normal. Her affect is blunt.    ED Course  Procedures (including critical care time) DIAGNOSTIC STUDIES: Oxygen Saturation is 100% on RA, normal by my interpretation.    COORDINATION OF CARE: 4:29 PM-Discussed treatment plan with pt at bedside and pt agreed to plan.     Labs Review Labs Reviewed - No data to display  Imaging Review No results found.    EKG Interpretation None      MDM   Final diagnoses:  Bilateral thoracic back pain  Bilateral low back pain without sciatica   Patient with chronic back pain, in pain management, presents with increased pain without injury. No neurological deficits and normal neuro exam.  Patient can walk but states is painful.  No loss of bowel or bladder control.  No concern for cauda equina.  No fever, night sweats, weight loss, h/o cancer, IVDU.  Back pain has some sciatica features, given toradol/decadrone shot in ED.  Will not prescribe narcotics, as pt already has oral and dermal patch opiods prescribed.  RICE protocol and pain medicine indicated and discussed with patient.  She asked for refill of atarax, which was given.  Pt denies SI, HI, AVH.  Stable for discharge.   I personally  performed the services described in this documentation, which was scribed in my presence. The recorded information has been reviewed and is accurate.        Danelle BerryLeisa Edge Mauger, PA-C 01/01/15 1807  Melene Planan Floyd, DO 01/01/15 2312

## 2015-05-15 DIAGNOSIS — Z139 Encounter for screening, unspecified: Secondary | ICD-10-CM

## 2015-05-20 NOTE — Congregational Nurse Program (Signed)
Congregational Nurse Program Note  Date of Encounter: 05/15/2015  Past Medical History: Past Medical History  Diagnosis Date  . GERD (gastroesophageal reflux disease)   . Gastroparesis   . Hypertension   . Restless legs   . H/O echocardiogram     a. 02/2010: done for htn/tachy while pregnant - EF 55-60%, grade 1 d/dysf, mildly dilated LA.  . High cholesterol   . Atrial fibrillation with RVR (HCC)     a. Dx 09/2012, spont conv to NSR.  Marland Kitchen. Sleep apnea   . Gestational diabetes   . Migraines   . Chronic lower back pain   . Anxiety   . Kidney stones   . Anemia     a. Mild with Hgb 11.2, 09/2012. UA with mod Hgb. Instructed to f/u PCP.  Marland Kitchen. Obesity   . Anxiety   . Depression   . Panic attack     Encounter Details:     CNP Questionnaire - 05/15/15 1348    Patient Demographics   Is this a new or existing patient? New   Patient is considered a/an Not Applicable   Race Caucasian/White   Patient Assistance   Location of Patient Assistance Not Applicable   Patient's financial/insurance status Low Income;Medicaid   Uninsured Patient No   Patient referred to apply for the following financial assistance Not Applicable   Food insecurities addressed Provided food supplies   Transportation assistance No   Assistance securing medications No   Educational health offerings Behavioral health;Chronic disease;Hypertension   Encounter Details   Primary purpose of visit Chronic Illness/Condition Visit;Education/Health Concerns   Was an Emergency Department visit averted? Not Applicable   Does patient have a medical provider? No   Patient referred to Clinic   Was a mental health screening completed? (GAINS tool) No   Does patient have dental issues? No   Does patient have vision issues? No   Since previous encounter, have you referred patient for abnormal blood pressure that resulted in a new diagnosis or medication change? No   Since previous encounter, have you referred patient for abnormal  blood glucose that resulted in a new diagnosis or medication change? No   For Abstraction Use Only   Does patient have insurance? Yes       B/P screening

## 2015-05-22 DIAGNOSIS — Z139 Encounter for screening, unspecified: Secondary | ICD-10-CM

## 2015-05-27 NOTE — Congregational Nurse Program (Signed)
Congregational Nurse Program Note  Date of Encounter: 05/22/2015  Past Medical History: Past Medical History  Diagnosis Date  . GERD (gastroesophageal reflux disease)   . Gastroparesis   . Hypertension   . Restless legs   . H/O echocardiogram     a. 02/2010: done for htn/tachy while pregnant - EF 55-60%, grade 1 d/dysf, mildly dilated LA.  . High cholesterol   . Atrial fibrillation with RVR (HCC)     a. Dx 09/2012, spont conv to NSR.  Marland Kitchen. Sleep apnea   . Gestational diabetes   . Migraines   . Chronic lower back pain   . Anxiety   . Kidney stones   . Anemia     a. Mild with Hgb 11.2, 09/2012. UA with mod Hgb. Instructed to f/u PCP.  Marland Kitchen. Obesity   . Anxiety   . Depression   . Panic attack     Encounter Details:     CNP Questionnaire - 05/22/15 1552    Patient Demographics   Is this a new or existing patient? Existing   Patient is considered a/an Not Applicable   Race Caucasian/White   Patient Assistance   Location of Patient Assistance Not Applicable   Patient's financial/insurance status Low Income;Medicaid   Uninsured Patient No   Patient referred to apply for the following financial assistance Not Applicable   Food insecurities addressed Provided food supplies   Transportation assistance No   Assistance securing medications No   Educational health offerings Behavioral health;Chronic disease;Hypertension   Encounter Details   Primary purpose of visit Chronic Illness/Condition Visit;Education/Health Concerns   Was an Emergency Department visit averted? Not Applicable   Does patient have a medical provider? No   Patient referred to Clinic   Was a mental health screening completed? (GAINS tool) No   Does patient have dental issues? No   Does patient have vision issues? No   Since previous encounter, have you referred patient for abnormal blood pressure that resulted in a new diagnosis or medication change? No   Since previous encounter, have you referred patient for  abnormal blood glucose that resulted in a new diagnosis or medication change? No   For Abstraction Use Only   Does patient have insurance? Yes       B/P check

## 2015-05-30 ENCOUNTER — Emergency Department (HOSPITAL_COMMUNITY)
Admission: EM | Admit: 2015-05-30 | Discharge: 2015-05-30 | Disposition: A | Discharge status: Home / Self Care | Payer: Medicaid Other | Attending: Emergency Medicine | Admitting: Emergency Medicine

## 2015-05-30 ENCOUNTER — Emergency Department (HOSPITAL_COMMUNITY): Payer: Medicaid Other

## 2015-05-30 ENCOUNTER — Encounter (HOSPITAL_COMMUNITY): Payer: Self-pay | Admitting: Family Medicine

## 2015-05-30 DIAGNOSIS — F410 Panic disorder [episodic paroxysmal anxiety] without agoraphobia: Secondary | ICD-10-CM | POA: Insufficient documentation

## 2015-05-30 DIAGNOSIS — K219 Gastro-esophageal reflux disease without esophagitis: Secondary | ICD-10-CM | POA: Diagnosis not present

## 2015-05-30 DIAGNOSIS — Z87442 Personal history of urinary calculi: Secondary | ICD-10-CM | POA: Diagnosis not present

## 2015-05-30 DIAGNOSIS — G2581 Restless legs syndrome: Secondary | ICD-10-CM | POA: Diagnosis not present

## 2015-05-30 DIAGNOSIS — I10 Essential (primary) hypertension: Secondary | ICD-10-CM | POA: Insufficient documentation

## 2015-05-30 DIAGNOSIS — G43909 Migraine, unspecified, not intractable, without status migrainosus: Secondary | ICD-10-CM | POA: Diagnosis not present

## 2015-05-30 DIAGNOSIS — G8929 Other chronic pain: Secondary | ICD-10-CM | POA: Diagnosis not present

## 2015-05-30 DIAGNOSIS — Z789 Other specified health status: Secondary | ICD-10-CM

## 2015-05-30 DIAGNOSIS — Z7289 Other problems related to lifestyle: Secondary | ICD-10-CM

## 2015-05-30 DIAGNOSIS — Z8632 Personal history of gestational diabetes: Secondary | ICD-10-CM | POA: Insufficient documentation

## 2015-05-30 DIAGNOSIS — F1010 Alcohol abuse, uncomplicated: Secondary | ICD-10-CM | POA: Insufficient documentation

## 2015-05-30 DIAGNOSIS — Z79899 Other long term (current) drug therapy: Secondary | ICD-10-CM

## 2015-05-30 DIAGNOSIS — E669 Obesity, unspecified: Secondary | ICD-10-CM | POA: Insufficient documentation

## 2015-05-30 DIAGNOSIS — F1090 Alcohol use, unspecified, uncomplicated: Secondary | ICD-10-CM

## 2015-05-30 DIAGNOSIS — N390 Urinary tract infection, site not specified: Secondary | ICD-10-CM | POA: Diagnosis not present

## 2015-05-30 DIAGNOSIS — E7800 Pure hypercholesterolemia, unspecified: Secondary | ICD-10-CM | POA: Insufficient documentation

## 2015-05-30 DIAGNOSIS — Z862 Personal history of diseases of the blood and blood-forming organs and certain disorders involving the immune mechanism: Secondary | ICD-10-CM | POA: Diagnosis not present

## 2015-05-30 DIAGNOSIS — F329 Major depressive disorder, single episode, unspecified: Secondary | ICD-10-CM | POA: Insufficient documentation

## 2015-05-30 DIAGNOSIS — R4182 Altered mental status, unspecified: Secondary | ICD-10-CM | POA: Diagnosis present

## 2015-05-30 LAB — URINE MICROSCOPIC-ADD ON

## 2015-05-30 LAB — CBC WITH DIFFERENTIAL/PLATELET
Basophils Absolute: 0 K/uL (ref 0.0–0.1)
Basophils Relative: 0 %
Eosinophils Absolute: 0 K/uL (ref 0.0–0.7)
Eosinophils Relative: 0 %
HCT: 34 % — ABNORMAL LOW (ref 36.0–46.0)
Hemoglobin: 11 g/dL — ABNORMAL LOW (ref 12.0–15.0)
Lymphocytes Relative: 27 %
Lymphs Abs: 1.2 K/uL (ref 0.7–4.0)
MCH: 27.5 pg (ref 26.0–34.0)
MCHC: 32.4 g/dL (ref 30.0–36.0)
MCV: 85 fL (ref 78.0–100.0)
Monocytes Absolute: 0.5 K/uL (ref 0.1–1.0)
Monocytes Relative: 10 %
Neutro Abs: 2.9 K/uL (ref 1.7–7.7)
Neutrophils Relative %: 63 %
Platelets: 145 K/uL — ABNORMAL LOW (ref 150–400)
RBC: 4 MIL/uL (ref 3.87–5.11)
RDW: 14.4 % (ref 11.5–15.5)
WBC: 4.6 K/uL (ref 4.0–10.5)

## 2015-05-30 LAB — URINALYSIS, ROUTINE W REFLEX MICROSCOPIC
Bilirubin Urine: NEGATIVE
Glucose, UA: NEGATIVE mg/dL
Ketones, ur: NEGATIVE mg/dL
Nitrite: POSITIVE — AB
Protein, ur: NEGATIVE mg/dL
Specific Gravity, Urine: 1.007 (ref 1.005–1.030)
pH: 6 (ref 5.0–8.0)

## 2015-05-30 LAB — COMPREHENSIVE METABOLIC PANEL WITH GFR
ALT: 10 U/L — ABNORMAL LOW (ref 14–54)
AST: 11 U/L — ABNORMAL LOW (ref 15–41)
Albumin: 3 g/dL — ABNORMAL LOW (ref 3.5–5.0)
Alkaline Phosphatase: 36 U/L — ABNORMAL LOW (ref 38–126)
Anion gap: 10 (ref 5–15)
BUN: 7 mg/dL (ref 6–20)
CO2: 25 mmol/L (ref 22–32)
Calcium: 8.3 mg/dL — ABNORMAL LOW (ref 8.9–10.3)
Chloride: 106 mmol/L (ref 101–111)
Creatinine, Ser: 0.82 mg/dL (ref 0.44–1.00)
GFR calc Af Amer: >60 mL/min
GFR calc non Af Amer: >60 mL/min
Glucose, Bld: 153 mg/dL — ABNORMAL HIGH (ref 65–99)
Potassium: 3.7 mmol/L (ref 3.5–5.1)
Sodium: 141 mmol/L (ref 135–145)
Total Bilirubin: 0.5 mg/dL (ref 0.3–1.2)
Total Protein: 5.7 g/dL — ABNORMAL LOW (ref 6.5–8.1)

## 2015-05-30 LAB — RAPID URINE DRUG SCREEN, HOSP PERFORMED
Amphetamines: NOT DETECTED
Barbiturates: NOT DETECTED
Benzodiazepines: NOT DETECTED
Cocaine: NOT DETECTED
Opiates: NOT DETECTED
Tetrahydrocannabinol: NOT DETECTED

## 2015-05-30 LAB — I-STAT TROPONIN, ED: Troponin i, poc: 0 ng/mL (ref 0.00–0.08)

## 2015-05-30 LAB — CBG MONITORING, ED: GLUCOSE-CAPILLARY: 143 mg/dL — AB (ref 65–99)

## 2015-05-30 LAB — ETHANOL: Alcohol, Ethyl (B): 59 mg/dL — ABNORMAL HIGH

## 2015-05-30 LAB — ACETAMINOPHEN LEVEL: Acetaminophen (Tylenol), Serum: <10 ug/mL — ABNORMAL LOW (ref 10–30)

## 2015-05-30 MED ORDER — CEPHALEXIN 500 MG PO CAPS: 500.0000 mg | ORAL_CAPSULE | Freq: Four times a day (QID) | ORAL | Status: DC

## 2015-05-30 MED ORDER — ASPIRIN 300 MG RE SUPP
300.0000 mg | Freq: Once | RECTAL | Status: DC
  Filled 2015-05-30: qty 1

## 2015-05-30 MED ORDER — DEXTROSE 5 % IV SOLN
1.0000 g | Freq: Once | INTRAVENOUS | Status: AC
  Administered 2015-05-30: 1 g via INTRAVENOUS
  Filled 2015-05-30: qty 10

## 2015-05-30 MED ORDER — NALOXONE HCL 2 MG/2ML IJ SOSY
2.0000 mg | PREFILLED_SYRINGE | Freq: Once | INTRAMUSCULAR | Status: AC
  Administered 2015-05-30: 2 mg via INTRAVENOUS
  Filled 2015-05-30: qty 2

## 2015-05-30 MED ORDER — ASPIRIN 81 MG PO CHEW
324.0000 mg | CHEWABLE_TABLET | Freq: Once | ORAL | Status: AC
  Administered 2015-05-30: 324 mg via ORAL
  Filled 2015-05-30: qty 4

## 2015-05-30 NOTE — ED Notes (Addendum)
EDPA and EDMD made aware of change in mental status. Will hold ASA until Head CT performed per EDPA

## 2015-05-30 NOTE — Discharge Instructions (Signed)
Urinary Tract Infection Urinary tract infections (UTIs) can develop anywhere along your urinary tract. Your urinary tract is your body's drainage system for removing wastes and extra water. Your urinary tract includes two kidneys, two ureters, a bladder, and a urethra. Your kidneys are a pair of bean-shaped organs. Each kidney is about the size of your fist. They are located below your ribs, one on each side of your spine. CAUSES Infections are caused by microbes, which are microscopic organisms, including fungi, viruses, and bacteria. These organisms are so small that they can only be seen through a microscope. Bacteria are the microbes that most commonly cause UTIs. SYMPTOMS  Symptoms of UTIs may vary by age and gender of the patient and by the location of the infection. Symptoms in young women typically include a frequent and intense urge to urinate and a painful, burning feeling in the bladder or urethra during urination. Older women and men are more likely to be tired, shaky, and weak and have muscle aches and abdominal pain. A fever may mean the infection is in your kidneys. Other symptoms of a kidney infection include pain in your back or sides below the ribs, nausea, and vomiting. DIAGNOSIS To diagnose a UTI, your caregiver will ask you about your symptoms. Your caregiver will also ask you to provide a urine sample. The urine sample will be tested for bacteria and white blood cells. White blood cells are made by your body to help fight infection. TREATMENT  Typically, UTIs can be treated with medication. Because most UTIs are caused by a bacterial infection, they usually can be treated with the use of antibiotics. The choice of antibiotic and length of treatment depend on your symptoms and the type of bacteria causing your infection. HOME CARE INSTRUCTIONS  If you were prescribed antibiotics, take them exactly as your caregiver instructs you. Finish the medication even if you feel better after  you have only taken some of the medication.  Drink enough water and fluids to keep your urine clear or pale yellow.  Avoid caffeine, tea, and carbonated beverages. They tend to irritate your bladder.  Empty your bladder often. Avoid holding urine for long periods of time.  Empty your bladder before and after sexual intercourse.  After a bowel movement, women should cleanse from front to back. Use each tissue only once. SEEK MEDICAL CARE IF:   You have back pain.  You develop a fever.  Your symptoms do not begin to resolve within 3 days. SEEK IMMEDIATE MEDICAL CARE IF:   You have severe back pain or lower abdominal pain.  You develop chills.  You have nausea or vomiting.  You have continued burning or discomfort with urination. MAKE SURE YOU:   Understand these instructions.  Will watch your condition.  Will get help right away if you are not doing well or get worse.   This information is not intended to replace advice given to you by your health care provider. Make sure you discuss any questions you have with your health care provider.  Follow up with your primary care provider as soon as possible. DO NOT DRINK ALCOHOL. Avoid taking any of your sedating medications today including muscle relaxers and trazodone. Take your antibiotics as prescribed.  Please allow patient to return to the homeless shelter tonight. She was seen and evaluated in the Emergency Room today. She was discharged at 5:45 PM.

## 2015-05-30 NOTE — ED Notes (Signed)
No change in LOC s/p Narcan x445min. VSS

## 2015-05-30 NOTE — ED Notes (Signed)
Pt presents via [facility] parking lot of homeless shelter w/ c/o AMS.  She began feeling lethargic this morning and a friend called EMS.  She was oriented when aroused by EMS. Upon arrival pt will open eyes to pain, but is nonverbal and has snoring respirations otherwise.  EDPA at bedside.

## 2015-05-30 NOTE — ED Provider Notes (Signed)
CSN: [id number]     Arrival date & time 05/30/15  1119 History   First MD Initiated Contact with Patient 05/30/15 1130     Chief Complaint  Patient presents with  . Altered Mental Status     (Consider location/radiation/quality/duration/timing/severity/associated sxs/prior Treatment) HPI   Sandra [name] is a 45 y.o F with a pmhx of HTN, HLD., multiple psych disorders who presents to the ED due to AMS. Level V caveat, AMS. Per EMS pt [facility] the homeless shelter with her boyfriend who told them that she began getting increasingly more lethargic and then fell asleep on the sidewalk. Pt was initially unresponsive to verbal stimuli so the boyfriend called EMS. Per EMS pts boyfriend did not know pts last name. Pt was oriented and arousable upon EMS arrival. Pt denies taking drugs, alcohol use, pain, HA.  Past Medical History  Diagnosis Date  . GERD (gastroesophageal reflux disease)   . Gastroparesis   . Hypertension   . Restless legs   . H/O echocardiogram     a. 02/2010: done for htn/tachy while pregnant - EF 55-60%, grade 1 d/dysf, mildly dilated LA.  . High cholesterol   . Atrial fibrillation with RVR (HCC)     a. Dx 09/2012, spont conv to NSR.  Marland Kitchen. Sleep apnea   . Gestational diabetes   . Migraines   . Chronic lower back pain   . Anxiety   . Kidney stones   . Anemia     a. Mild with Hgb 11.2, 09/2012. UA with mod Hgb. Instructed to f/u PCP.  Marland Kitchen. Obesity   . Anxiety   . Depression   . Panic attack    Past Surgical History  Procedure Laterality Date  . Tubal ligation  2012  . Cesarean section  2012  . Dilation and curettage of uterus  2006; 2010  . Cystoscopy/retrograde/ureteroscopy/stone extraction with basket  1987  . Cystoscopy w/ ureteral stent placement  2006  . Back surgery     Family History  Problem Relation Age of Onset  . Diabetes Mother   . Hypertension Mother   . Migraines Father   . Migraines Brother    Social History  Substance Use Topics  . Smoking  status: Never Smoker   . Smokeless tobacco: Never Used  . Alcohol Use: No   OB History    No data available     Review of Systems  All other systems reviewed and are negative.     Allergies  Iodides; Imitrex; and Metoclopramide hcl  Home Medications   Prior to Admission medications   Medication Sig Start Date End Date Taking? Authorizing Provider  buprenorphine (BUTRANS) 20 MCG/HR PTWK patch Place 20 mcg onto the skin once a week.    Historical Provider, MD  dexlansoprazole (DEXILANT) 60 MG capsule Take 60 mg by mouth 2 (two) times daily.    Historical Provider, MD  DULoxetine 40 MG CPEP Take 40 mg by mouth daily. For depression 11/18/14   Sanjuana KavaAgnes I Nwoko, NP  gabapentin (NEURONTIN) 100 MG capsule Take 1 capsule (100 mg total) by mouth 3 (three) times daily. For agitation Patient not taking: Reported on 01/01/2015 11/18/14   Sanjuana KavaAgnes I Nwoko, NP  hydrOXYzine (ATARAX/VISTARIL) 25 MG tablet Take 1 tablet (25 mg total) by mouth every 6 (six) hours as needed for anxiety. 01/01/15   [name], PA-C  hydrOXYzine (ATARAX/VISTARIL) 25 MG tablet Take 25 mg by mouth every 6 (six) hours as needed for anxiety.  Historical Provider, MD  labetalol (NORMODYNE) 100 MG tablet Take 100 mg by mouth daily.    Historical Provider, MD  pregabalin (LYRICA) 300 MG capsule Take 300 mg by mouth 3 (three) times daily.    Historical Provider, MD  promethazine (PHENERGAN) 25 MG tablet Take 25 mg by mouth every 6 (six) hours as needed for nausea or vomiting.    Historical Provider, MD  Tapentadol HCl (NUCYNTA) 100 MG TABS Take 700 mg by mouth daily. Patient states she takes seven of the  tablets every day per patient    Historical Provider, MD  tizanidine (ZANAFLEX) 6 MG capsule Take 6 mg by mouth 4 (four) times daily. Take 4 times every day per patient    Historical Provider, MD  traZODone (DESYREL) 100 MG tablet Take 400 mg by mouth at bedtime.    Historical Provider, MD  traZODone (DESYREL) 50 MG tablet  Take 1 tablet (50 mg total) by mouth at bedtime as needed and may repeat dose one time if needed for sleep. Patient not taking: Reported on 01/01/2015 11/18/14   Sanjuana Kava, NP   BP 102/72 mmHg  Pulse 92  Temp(Src) 98.4 F (36.9 C) (Rectal)  Resp 26  SpO2 99% Physical Exam  Constitutional: She appears well-developed and well-nourished. She appears lethargic. No distress.  Pt snoring, very lethargic  HENT:  Head: Normocephalic and atraumatic.  Mouth/Throat: Oropharynx is clear and moist. No oropharyngeal exudate.  Eyes: Conjunctivae and EOM are normal. Pupils are equal, round, and reactive to light. Right eye exhibits no discharge. Left eye exhibits no discharge. No scleral icterus.  Neck: Neck supple.  Cardiovascular: Normal rate, regular rhythm, normal heart sounds and intact distal pulses.  Exam reveals no gallop and no friction rub.   No murmur heard. Pulmonary/Chest: Effort normal and breath sounds normal. No respiratory distress. She has no wheezes. She has no rales. She exhibits no tenderness.  Abdominal: Soft. She exhibits no distension. There is no tenderness. There is no guarding.  Musculoskeletal: Normal range of motion. She exhibits no edema.  Lymphadenopathy:    She has no cervical adenopathy.  Neurological: She has normal reflexes. She appears lethargic. No cranial nerve deficit or sensory deficit. GCS eye subscore is 2. GCS verbal subscore is 4. GCS motor subscore is 5.  Skin: Skin is warm and dry. No rash noted. She is not diaphoretic. No erythema. No pallor.  Psychiatric: She has a normal mood and affect. Her behavior is normal.  Nursing note and vitals reviewed.   ED Course  Procedures (including critical care time) Labs Review Labs Reviewed  URINE CULTURE - Abnormal; Notable for the following:    Culture >=100,000 COLONIES/mL ESCHERICHIA COLI (*)    Organism ID, Bacteria ESCHERICHIA COLI (*)    All other components within normal limits  ACETAMINOPHEN LEVEL -  Abnormal; Notable for the following:    Acetaminophen (Tylenol), Serum <10 (*)    All other components within normal limits  COMPREHENSIVE METABOLIC PANEL - Abnormal; Notable for the following:    Glucose, Bld 153 (*)    Calcium 8.3 (*)    Total Protein 5.7 (*)    Albumin 3.0 (*)    AST 11 (*)    ALT 10 (*)    Alkaline Phosphatase 36 (*)    All other components within normal limits  CBC WITH DIFFERENTIAL/PLATELET - Abnormal; Notable for the following:    Hemoglobin 11.0 (*)    HCT 34.0 (*)    Platelets 145 (*)  All other components within normal limits  URINALYSIS, ROUTINE W REFLEX MICROSCOPIC (NOT AT [facility]) - Abnormal; Notable for the following:    APPearance TURBID (*)    Hgb urine dipstick SMALL (*)    Nitrite POSITIVE (*)    Leukocytes, UA LARGE (*)    All other components within normal limits  ETHANOL - Abnormal; Notable for the following:    Alcohol, Ethyl (B) 59 (*)    All other components within normal limits  URINE MICROSCOPIC-ADD ON - Abnormal; Notable for the following:    Squamous Epithelial / LPF 6-30 (*)    Bacteria, UA MANY (*)    All other components within normal limits  CBG MONITORING, ED - Abnormal; Notable for the following:    Glucose-Capillary 143 (*)    All other components within normal limits  URINE RAPID DRUG SCREEN, HOSP PERFORMED  I-STAT TROPOININ, ED    Imaging Review No results found. I have personally reviewed and evaluated these images and lab results as part of my medical decision-making.   EKG Interpretation   Date/Time:  Friday May 30 2015 11:24:50 EDT Ventricular Rate:  87 PR Interval:  135 QRS Duration: 96 QT Interval:  385 QTC Calculation: 463 R Axis:   29 Text Interpretation:  Sinus rhythm ED PHYSICIAN INTERPRETATION AVAILABLE  IN [facility] Confirmed by TEST, Record ([id number]) on 05/31/2015 9:08:15  AM      MDM   Final diagnoses:  UTI (lower urinary tract infection)  Alcohol use (HCC)  Polypharmacy    45 y.o  F presents to the ED with acute altered mental status. In ED, pt is lethargic, will not spontaneously open her eyes. However, pt responds to to verbal and pain stimuli. Patient is currently taking multiple sedating medications and anticholinergics. She adamantly denies that she has not taken more than she is prescribed. Blood sugar is 143. She is hemodynamically stable. Denies pain. Pupils equal and reactive. Concern for drug overdose. Upon review of patient's records. She has been previously intubated due to drug overdose and has had several episodes with similar presentation with acute encephalopathy. She also has history of seizures per chart review. Patient given 2 of Narcan without change in mental status for 5 minutes. We'll send for CT head and obtain toxicology labs, basic labs. Patient receiving IV fluids.  15 minutes after receiving Narcan patient is now awake and alert and having a conversation with nursing staff. Patient now states that she took 2 additional muscle relaxants and took additional antianxiety medications today.  UA reveals large leukocytes, nitrite positive, TNTC WBC and many bacteria. Pt give 1g IV rocephin in ED. Ethanol level 59. Pts AMS likely a combination of alcohol intoxication, polypharmacy and UTI. CT head negative and all other blood work wnl. Pt is alert and oriented. Able to answer all questions. Pt ambulatory in ED, drinking juice and eating crackers. All VSS. Pt will be d/c home on Keflex. Encouraged pt to d/c all sedating medications. Avoid alcohol. Follow up with PCP. Return precautions outlined in patient discharge instructions.   Patient was discussed with and seen by Dr. Clydene Pugh who agrees with the treatment plan.    Lester Kinsman Uehling, PA-C 06/02/15 0272  Lyndal Pulley, MD 06/04/15 [phone]

## 2015-06-01 LAB — URINE CULTURE
Culture: >=100000 — AB
Special Requests: NORMAL

## 2015-06-02 ENCOUNTER — Telehealth (HOSPITAL_BASED_OUTPATIENT_CLINIC_OR_DEPARTMENT_OTHER): Payer: Self-pay | Admitting: Emergency Medicine

## 2015-06-02 NOTE — Telephone Encounter (Signed)
Post ED Visit - Positive Culture Follow-up  Culture report reviewed by antimicrobial stewardship pharmacist:  []  Sandra [name], Pharm.D. []  Sandra [name], Pharm.D., BCPS []  Sandra [name], Pharm.D. []  Sandra [name], Pharm.D., BCPS []  Sandra [name], [address]Ph[address].D., BCPS, AAHIVP []  Sandra [name], Pharm.D., BCPS, AAHIVP []  Sandra [name], Pharm.D. []  Sandra [name], [place]Sandra [name]. Sandra [name] PharmD  Positive urine culture Treated with cephalexin, organism sensitive to the same and no further patient follow-up is required at this time.  Sandra [name], Sandra [name] 06/02/2015, 11:07 AM

## 2015-06-03 ENCOUNTER — Encounter (HOSPITAL_COMMUNITY): Payer: Self-pay | Admitting: *Deleted

## 2015-06-03 ENCOUNTER — Emergency Department (HOSPITAL_COMMUNITY)
Admission: EM | Admit: 2015-06-03 | Discharge: 2015-06-04 | Disposition: A | Discharge status: Home / Self Care | Payer: Medicaid Other | Attending: Emergency Medicine | Admitting: Emergency Medicine

## 2015-06-03 DIAGNOSIS — I10 Essential (primary) hypertension: Secondary | ICD-10-CM | POA: Diagnosis not present

## 2015-06-03 DIAGNOSIS — R251 Tremor, unspecified: Secondary | ICD-10-CM | POA: Diagnosis not present

## 2015-06-03 DIAGNOSIS — Z139 Encounter for screening, unspecified: Secondary | ICD-10-CM

## 2015-06-03 DIAGNOSIS — F329 Major depressive disorder, single episode, unspecified: Secondary | ICD-10-CM | POA: Diagnosis not present

## 2015-06-03 DIAGNOSIS — R42 Dizziness and giddiness: Secondary | ICD-10-CM | POA: Diagnosis present

## 2015-06-03 LAB — COMPREHENSIVE METABOLIC PANEL
ALBUMIN: 3.7 g/dL (ref 3.5–5.0)
ALT: 21 U/L (ref 14–54)
AST: 40 U/L (ref 15–41)
Alkaline Phosphatase: 42 U/L (ref 38–126)
Anion gap: 9 (ref 5–15)
BILIRUBIN TOTAL: 0.3 mg/dL (ref 0.3–1.2)
BUN: 11 mg/dL (ref 6–20)
CALCIUM: 8.6 mg/dL — AB (ref 8.9–10.3)
CO2: 27 mmol/L (ref 22–32)
CREATININE: 0.88 mg/dL (ref 0.44–1.00)
Chloride: 102 mmol/L (ref 101–111)
GFR calc Af Amer: >60 mL/min (ref >60)
Glucose, Bld: 107 mg/dL — ABNORMAL HIGH (ref 65–99)
Potassium: 4.1 mmol/L (ref 3.5–5.1)
Sodium: 138 mmol/L (ref 135–145)
Total Protein: 6.8 g/dL (ref 6.5–8.1)

## 2015-06-03 LAB — CBC
HCT: 34.4 % — ABNORMAL LOW (ref 36.0–46.0)
Hemoglobin: 11.1 g/dL — ABNORMAL LOW (ref 12.0–15.0)
MCH: 26.7 pg (ref 26.0–34.0)
MCHC: 32.3 g/dL (ref 30.0–36.0)
MCV: 82.7 fL (ref 78.0–100.0)
PLATELETS: 213 10*3/uL (ref 150–400)
RBC: 4.16 MIL/uL (ref 3.87–5.11)
RDW: 14.4 % (ref 11.5–15.5)
WBC: 5.2 10*3/uL (ref 4.0–10.5)

## 2015-06-03 LAB — RAPID URINE DRUG SCREEN, HOSP PERFORMED
Amphetamines: POSITIVE — AB
BARBITURATES: NOT DETECTED
Benzodiazepines: NOT DETECTED
Cocaine: NOT DETECTED
Opiates: NOT DETECTED
Tetrahydrocannabinol: NOT DETECTED

## 2015-06-03 LAB — I-STAT BETA HCG BLOOD, ED (MC, WL, AP ONLY)

## 2015-06-03 LAB — ETHANOL: ALCOHOL ETHYL (B): 27 mg/dL — AB (ref <5)

## 2015-06-03 LAB — SALICYLATE LEVEL: Salicylate Lvl: <4 mg/dL (ref 2.8–30.0)

## 2015-06-03 LAB — ACETAMINOPHEN LEVEL: Acetaminophen (Tylenol), Serum: <10 ug/mL — ABNORMAL LOW (ref 10–30)

## 2015-06-03 NOTE — ED Notes (Signed)
Pt denies hx of Substance/ETOH Abuse. Pt states she drinks 1-2 beers a day. Pt states hx of HTN, but has not been taking her BP >26months due to not having a PCP.

## 2015-06-03 NOTE — ED Notes (Addendum)
[facility]; per [facility] pt needs to have a psych eval; pt states that she has been waking up every morning with dizziness and not feeling like her self; pt states that the symptoms last for a short time then go away; pt denies symptoms currently; pt states that she feels confused when she wakes up; pt denies SI/ HI or depression; pt states that she was seen a few days ago for the same thing but is still having symptoms; pt states that her friends thought she needed to come be evaluated

## 2015-06-04 NOTE — ED Provider Notes (Signed)
CSN: [id number]     Arrival date & time 06/03/15  1929 History   First MD Initiated Contact with Patient 06/03/15 2203     Chief Complaint  Patient presents with  . Dizziness     (Consider location/radiation/quality/duration/timing/severity/associated sxs/prior Treatment) Patient is a 45 y.o. female presenting with dizziness. The history is provided by the patient and medical records. No language interpreter was used.  Dizziness Associated symptoms: headaches   Associated symptoms: no chest pain, no nausea, no shortness of breath and no vomiting    Sandra [name] is a 45 y.o. female  who presents to the Emergency Department [facility]. Per chart review, [facility] sent her over for a psych evaluation. On my initial interview, patient states that she came to the Emergency Department for bilateral hand tremors which occurs intermittently over the last year. Associated symptoms include headache. Patient initially informed nursing staff that she was dizzy, however when asked by me, patient states dizziness has resolved. She states she does not feel like herself. Denies auditory/visual hallucinations, SI/HI. Denies drug use. Admits to occasional EtOH use, most recently around 2 PM today. She denies daily drinking, stating that she drinks about twice a week. She denies history of alcohol or drug abuse.  Past Medical History  Diagnosis Date  . GERD (gastroesophageal reflux disease)   . Gastroparesis   . Hypertension   . Restless legs   . H/O echocardiogram     a. 02/2010: done for htn/tachy while pregnant - EF 55-60%, grade 1 d/dysf, mildly dilated LA.  . High cholesterol   . Atrial fibrillation with RVR (HCC)     a. Dx 09/2012, spont conv to NSR.  Marland Kitchen. Sleep apnea   . Gestational diabetes   . Migraines   . Chronic lower back pain   . Anxiety   . Kidney stones   . Anemia     a. Mild with Hgb 11.2, 09/2012. UA with mod Hgb. Instructed to f/u PCP.  Marland Kitchen. Obesity   .  Anxiety   . Depression   . Panic attack    Past Surgical History  Procedure Laterality Date  . Tubal ligation  2012  . Cesarean section  2012  . Dilation and curettage of uterus  2006; 2010  . Cystoscopy/retrograde/ureteroscopy/stone extraction with basket  1987  . Cystoscopy w/ ureteral stent placement  2006  . Back surgery     Family History  Problem Relation Age of Onset  . Diabetes Mother   . Hypertension Mother   . Migraines Father   . Migraines Brother    Social History  Substance Use Topics  . Smoking status: Never Smoker   . Smokeless tobacco: Never Used  . Alcohol Use: No   OB History    No data available     Review of Systems  Constitutional: Negative for fever.  HENT: Negative for congestion.   Eyes: Negative for visual disturbance.  Respiratory: Negative for cough and shortness of breath.   Cardiovascular: Negative for chest pain.  Gastrointestinal: Negative for nausea, vomiting and abdominal pain.  Musculoskeletal: Negative for myalgias and neck pain.  Skin: Negative for rash.  Neurological: Positive for tremors and headaches. Negative for dizziness.  Psychiatric/Behavioral: Negative for suicidal ideas and self-injury.      Allergies  Iodides; Imitrex; and Metoclopramide hcl  Home Medications   Prior to Admission medications   Medication Sig Start Date End Date Taking? Authorizing Provider  busPIRone (BUSPAR) 10 MG tablet Take  10 mg by mouth 2 (two) times daily.   Yes Historical Provider, MD  cephALEXin (KEFLEX) 500 MG capsule Take 1 capsule (500 mg total) by mouth 4 (four) times daily. 05/30/15  Yes Samantha Tripp Dowless, PA-C  hydrOXYzine (ATARAX/VISTARIL) 25 MG tablet Take 1 tablet (25 mg total) by mouth every 6 (six) hours as needed for anxiety. 01/01/15  Yes Danelle Berry, PA-C  mirtazapine (REMERON) 15 MG tablet Take 15 mg by mouth at bedtime.   Yes Historical Provider, MD  prazosin (MINIPRESS) 1 MG capsule Take 1 mg by mouth at bedtime.   Yes  Historical Provider, MD  pregabalin (LYRICA) 300 MG capsule Take 300 mg by mouth 3 (three) times daily.   Yes Historical Provider, MD  Tapentadol HCl (NUCYNTA) 100 MG TABS Take 300 mg by mouth 2 (two) times daily.    Yes Historical Provider, MD  traZODone (DESYREL) 100 MG tablet Take 100 mg by mouth at bedtime.    Yes Historical Provider, MD  DULoxetine 40 MG CPEP Take 40 mg by mouth daily. For depression Patient not taking: Reported on 06/03/2015 11/18/14   Sanjuana Kava, NP  gabapentin (NEURONTIN) 100 MG capsule Take 1 capsule (100 mg total) by mouth 3 (three) times daily. For agitation Patient not taking: Reported on 01/01/2015 11/18/14   Sanjuana Kava, NP  promethazine (PHENERGAN) 25 MG tablet Take 25 mg by mouth every 6 (six) hours as needed for nausea or vomiting.    Historical Provider, MD  traZODone (DESYREL) 50 MG tablet Take 1 tablet (50 mg total) by mouth at bedtime as needed and may repeat dose one time if needed for sleep. Patient not taking: Reported on 06/03/2015 11/18/14   Sanjuana Kava, NP   BP 107/76 mmHg  Pulse 85  Temp(Src) 98.1 F (36.7 C) (Oral)  Resp 14  SpO2 94%  LMP  (LMP Unknown) Physical Exam  Constitutional: She is oriented to person, place, and time. She appears well-developed and well-nourished.  Alert and in no acute distress  HENT:  Head: Normocephalic and atraumatic.  Cardiovascular: Normal rate, regular rhythm and normal heart sounds.  Exam reveals no gallop and no friction rub.   No murmur heard. Pulmonary/Chest: Effort normal and breath sounds normal. No respiratory distress. She has no wheezes. She has no rales.  Abdominal: Soft. She exhibits no distension. There is no tenderness.  Musculoskeletal: Normal range of motion.  No tremors appreciated.   Neurological: She is alert and oriented to person, place, and time. No cranial nerve deficit.  Skin: Skin is warm and dry.  Nursing note and vitals reviewed.   ED Course  Procedures (including critical  care time) Labs Review Labs Reviewed  COMPREHENSIVE METABOLIC PANEL - Abnormal; Notable for the following:    Glucose, Bld 107 (*)    Calcium 8.6 (*)    All other components within normal limits  ETHANOL - Abnormal; Notable for the following:    Alcohol, Ethyl (B) 27 (*)    All other components within normal limits  ACETAMINOPHEN LEVEL - Abnormal; Notable for the following:    Acetaminophen (Tylenol), Serum <10 (*)    All other components within normal limits  CBC - Abnormal; Notable for the following:    Hemoglobin 11.1 (*)    HCT 34.4 (*)    All other components within normal limits  URINE RAPID DRUG SCREEN, HOSP PERFORMED - Abnormal; Notable for the following:    Amphetamines POSITIVE (*)    All other components within  normal limits  SALICYLATE LEVEL  I-STAT BETA HCG BLOOD, ED ([facility], [facility], [facility] ONLY)    Imaging Review No results found. I have personally reviewed and evaluated these images and lab results as part of my medical decision-making.   EKG Interpretation None      MDM   Final diagnoses:  Tremor of both hands   Sandra [name] presents to ED sent from [facility] for a psych evaluation. Patient denies history of drug use, UDS positive for amphetamines. EtOH 27. All other labs reviewed and reassuring. Patient initially complained of dizziness to nursing staff, on my evaluation she denied dizziness stating that it had resolved. She complains of bilateral hand tremors which occur intermittently for the last year. I did not appreciate any tremors on my examination. Patient denies suicidal or homicidal ideations. Denies auditory and visual hallucinations. TTS evaluated patient and states the patient does not meet inpatient criteria. It was offered to patient to stay in ED tonight for observation and to see the psychiatrist in the morning here in the emergency Department, however patient declined and would like to be discharged.   [facility] Ward, PA-C 06/04/15  0105  Paula Libra, MD 06/04/15 [phone]

## 2015-06-04 NOTE — BH Assessment (Signed)
Spoke with patient to inform her of the recommendation from Donell SievertSpencer Simon, [place]PA-C. Patient declined and reports "I feel like this all the time." Patient states that she does not want to walk to the shelter and cannot ride the bus because the buses have stopped running for the night. Informed EDP of recommendation and patient refusal to sign in.   Sandra PokeJoVea Wendle Kina, LCSW Therapeutic Triage Specialist [facility] 06/04/2015 12:48 AM
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
## 2015-06-04 NOTE — Discharge Instructions (Signed)
Please follow-up with your primary care provider in regards to today's visit.  Please return to the ER for any new or worsening symptoms, any additional concerns.

## 2015-06-04 NOTE — BH Assessment (Signed)
Assessment completed. Consulted with Donell SievertSpencer Simon, PA-C who states that patient does not meet inpatient criteria but patient can be considered for observation in the observation unit at this time.    Davina PokeJoVea Cam Harnden, LCSW Therapeutic Triage Specialist  Health 06/04/2015 12:38 AM

## 2015-06-05 NOTE — Congregational Nurse Program (Signed)
Congregational Nurse Program Note  Date of Encounter: 06/03/2015  Past Medical History: Past Medical History  Diagnosis Date  . GERD (gastroesophageal reflux disease)   . Gastroparesis   . Hypertension   . Restless legs   . H/O echocardiogram     a. 02/2010: done for htn/tachy while pregnant - EF 55-60%, grade 1 d/dysf, mildly dilated LA.  . High cholesterol   . Atrial fibrillation with RVR (HCC)     a. Dx 09/2012, spont conv to NSR.  Marland Kitchen. Sleep apnea   . Gestational diabetes   . Migraines   . Chronic lower back pain   . Anxiety   . Kidney stones   . Anemia     a. Mild with Hgb 11.2, 09/2012. UA with mod Hgb. Instructed to f/u PCP.  Marland Kitchen. Obesity   . Anxiety   . Depression   . Panic attack     Encounter Details:     CNP Questionnaire - 06/03/15 1442    Patient Demographics   Is this a new or existing patient? Existing   Patient is considered a/an Not Applicable   Race Caucasian/White   Patient Assistance   Location of Patient Assistance Not Applicable   Patient's financial/insurance status Low Income;Medicaid   Uninsured Patient No   Patient referred to apply for the following financial assistance Not Applicable   Food insecurities addressed Provided food supplies   Transportation assistance No   Assistance securing medications No   Educational health offerings Behavioral health;Chronic disease;Hypertension;Navigating the healthcare system   Encounter Details   Primary purpose of visit Chronic Illness/Condition Visit;Education/Health Concerns;Post ED/Hospitalization Visit   Was an Emergency Department visit averted? No   Does patient have a medical provider? No   Patient referred to Clinic   Was a mental health screening completed? (GAINS tool) No   Does patient have dental issues? No   Does patient have vision issues? No   Since previous encounter, have you referred patient for abnormal blood pressure that resulted in a new diagnosis or medication change? No   Since  previous encounter, have you referred patient for abnormal blood glucose that resulted in a new diagnosis or medication change? No   For Abstraction Use Only   Does patient have insurance? Yes       B/P check.  States when to the ED on the 14 because she was dizzy and B/P low.  Discussed with client the need to increase fluids.  Discussed with her the need to [facility] to establish with a PCP

## 2015-06-12 DIAGNOSIS — Z139 Encounter for screening, unspecified: Secondary | ICD-10-CM

## 2015-06-12 LAB — GLUCOSE, POCT (MANUAL RESULT ENTRY): POC Glucose: 118 mg/dl — AB (ref 70–99)

## 2015-06-12 NOTE — Congregational Nurse Program (Signed)
Congregational Nurse Program Note  Date of Encounter: 06/12/2015  Past Medical History: Past Medical History  Diagnosis Date  . GERD (gastroesophageal reflux disease)   . Gastroparesis   . Hypertension   . Restless legs   . H/O echocardiogram     a. 02/2010: done for htn/tachy while pregnant - EF 55-60%, grade 1 d/dysf, mildly dilated LA.  . High cholesterol   . Atrial fibrillation with RVR (HCC)     a. Dx 09/2012, spont conv to NSR.  Marland Kitchen. Sleep apnea   . Gestational diabetes   . Migraines   . Chronic lower back pain   . Anxiety   . Kidney stones   . Anemia     a. Mild with Hgb 11.2, 09/2012. UA with mod Hgb. Instructed to f/u PCP.  Marland Kitchen. Obesity   . Anxiety   . Depression   . Panic attack     Encounter Details:     CNP Questionnaire - 06/12/15 1419    Patient Demographics   Is this a new or existing patient? Existing   Patient is considered a/an Not Applicable   Race Caucasian/White   Patient Assistance   Location of Patient Assistance Not Applicable   Patient's financial/insurance status Low Income;Medicaid   Uninsured Patient No   Patient referred to apply for the following financial assistance Not Applicable   Food insecurities addressed Provided food supplies   Transportation assistance No   Assistance securing medications No   Educational health offerings Behavioral health;Chronic disease;Hypertension;Navigating the healthcare system   Encounter Details   Primary purpose of visit Chronic Illness/Condition Visit;Education/Health Concerns;Post ED/Hospitalization Visit;Navigating the Healthcare System   Was an Emergency Department visit averted? Not Applicable   Does patient have a medical provider? No   Patient referred to Clinic   Was a mental health screening completed? (GAINS tool) No   Does patient have dental issues? No   Does patient have vision issues? No   Since previous encounter, have you referred patient for abnormal blood pressure that resulted in a new  diagnosis or medication change? No   Since previous encounter, have you referred patient for abnormal blood glucose that resulted in a new diagnosis or medication change? No   For Abstraction Use Only   Does patient have insurance? Yes       B/P screening.  Requested a glucometer stating she was told to "monitor her blood sugars".  Denies having a dx of diabetes.  CBG's taken at the clinic have been within a normal range.  Discussed with client the need to establish with a PCP.  Referred to [facility].  Informed client that if she had a diagnosis of diabetes, we would then discuss a glucometer and diabetic teaching

## 2015-07-01 ENCOUNTER — Emergency Department (HOSPITAL_COMMUNITY): Payer: Medicaid Other

## 2015-07-01 ENCOUNTER — Encounter (HOSPITAL_COMMUNITY): Payer: Self-pay | Admitting: Emergency Medicine

## 2015-07-01 ENCOUNTER — Emergency Department (HOSPITAL_COMMUNITY)
Admission: EM | Admit: 2015-07-01 | Discharge: 2015-07-01 | Disposition: A | Discharge status: Home / Self Care | Payer: Medicaid Other | Attending: Emergency Medicine | Admitting: Emergency Medicine

## 2015-07-01 DIAGNOSIS — T50904A Poisoning by unspecified drugs, medicaments and biological substances, undetermined, initial encounter: Secondary | ICD-10-CM | POA: Diagnosis present

## 2015-07-01 DIAGNOSIS — I4891 Unspecified atrial fibrillation: Secondary | ICD-10-CM | POA: Diagnosis not present

## 2015-07-01 DIAGNOSIS — Z79899 Other long term (current) drug therapy: Secondary | ICD-10-CM | POA: Insufficient documentation

## 2015-07-01 DIAGNOSIS — R5383 Other fatigue: Secondary | ICD-10-CM | POA: Diagnosis not present

## 2015-07-01 DIAGNOSIS — I10 Essential (primary) hypertension: Secondary | ICD-10-CM | POA: Insufficient documentation

## 2015-07-01 DIAGNOSIS — K219 Gastro-esophageal reflux disease without esophagitis: Secondary | ICD-10-CM | POA: Diagnosis not present

## 2015-07-01 DIAGNOSIS — E7800 Pure hypercholesterolemia, unspecified: Secondary | ICD-10-CM | POA: Insufficient documentation

## 2015-07-01 DIAGNOSIS — Z79891 Long term (current) use of opiate analgesic: Secondary | ICD-10-CM | POA: Diagnosis not present

## 2015-07-01 DIAGNOSIS — Z7982 Long term (current) use of aspirin: Secondary | ICD-10-CM | POA: Insufficient documentation

## 2015-07-01 LAB — COMPREHENSIVE METABOLIC PANEL
ALK PHOS: 51 U/L (ref 38–126)
ALT: 13 U/L — ABNORMAL LOW (ref 14–54)
AST: 13 U/L — AB (ref 15–41)
Albumin: 4.1 g/dL (ref 3.5–5.0)
Anion gap: 5 (ref 5–15)
BILIRUBIN TOTAL: 0.6 mg/dL (ref 0.3–1.2)
BUN: 15 mg/dL (ref 6–20)
CO2: 28 mmol/L (ref 22–32)
Calcium: 8.8 mg/dL — ABNORMAL LOW (ref 8.9–10.3)
Chloride: 106 mmol/L (ref 101–111)
Creatinine, Ser: 1.01 mg/dL — ABNORMAL HIGH (ref 0.44–1.00)
GFR calc Af Amer: >60 mL/min (ref >60)
GFR calc non Af Amer: >60 mL/min (ref >60)
GLUCOSE: 89 mg/dL (ref 65–99)
POTASSIUM: 4.1 mmol/L (ref 3.5–5.1)
Sodium: 139 mmol/L (ref 135–145)
TOTAL PROTEIN: 7.5 g/dL (ref 6.5–8.1)

## 2015-07-01 LAB — ETHANOL: Alcohol, Ethyl (B): <5 mg/dL (ref <5)

## 2015-07-01 LAB — CBC
HEMATOCRIT: 39.8 % (ref 36.0–46.0)
HEMOGLOBIN: 12.6 g/dL (ref 12.0–15.0)
MCH: 27.2 pg (ref 26.0–34.0)
MCHC: 31.7 g/dL (ref 30.0–36.0)
MCV: 86 fL (ref 78.0–100.0)
Platelets: 153 10*3/uL (ref 150–400)
RBC: 4.63 MIL/uL (ref 3.87–5.11)
RDW: 14.5 % (ref 11.5–15.5)
WBC: 5.9 10*3/uL (ref 4.0–10.5)

## 2015-07-01 LAB — RAPID URINE DRUG SCREEN, HOSP PERFORMED
AMPHETAMINES: NOT DETECTED
BARBITURATES: NOT DETECTED
BENZODIAZEPINES: NOT DETECTED
COCAINE: NOT DETECTED
Opiates: NOT DETECTED
TETRAHYDROCANNABINOL: NOT DETECTED

## 2015-07-01 LAB — SALICYLATE LEVEL: Salicylate Lvl: <4 mg/dL (ref 2.8–30.0)

## 2015-07-01 LAB — CBG MONITORING, ED: GLUCOSE-CAPILLARY: 99 mg/dL (ref 65–99)

## 2015-07-01 LAB — ACETAMINOPHEN LEVEL

## 2015-07-01 MED ORDER — ACETAMINOPHEN 325 MG PO TABS
650.0000 mg | ORAL_TABLET | Freq: Once | ORAL | Status: AC
  Administered 2015-07-01: 650 mg via ORAL
  Filled 2015-07-01: qty 2

## 2015-07-01 NOTE — ED Notes (Addendum)
Per EMS, patient is very drowsy; possible unintentional overdose; patient recently had a sleep medication added to her other medications. Patient states she did not take more than prescribed. Denies SI/HI. Responsive, alert, oriented x4, follows commands Patient is from homeless shelter [facility]

## 2015-07-01 NOTE — ED Notes (Addendum)
Poison Control notified. States it could be the compounded effect of her other medications. Obtain: EKG, acetaminophen, CMP, cardiac monitor, observe for 6 hours or until patient is awake/alert

## 2015-07-01 NOTE — ED Provider Notes (Signed)
CSN: [id number]     Arrival date & time 07/01/15  8295 History   First MD Initiated Contact with Patient 07/01/15 0935     Chief Complaint  Patient presents with  . Drug Overdose     (Consider location/radiation/quality/duration/timing/severity/associated sxs/prior Treatment) Patient is a 45 y.o. female presenting with general illness.  Illness Location:  Body Quality:  Sleepy Severity:  Moderate Onset quality:  Unable to specify Duration:  1 day Timing:  Unable to specify Progression:  Unable to specify Chronicity:  New   Past Medical History  Diagnosis Date  . GERD (gastroesophageal reflux disease)   . Gastroparesis   . Hypertension   . Restless legs   . H/O echocardiogram     a. 02/2010: done for htn/tachy while pregnant - EF 55-60%, grade 1 d/dysf, mildly dilated LA.  . High cholesterol   . Atrial fibrillation with RVR (HCC)     a. Dx 09/2012, spont conv to NSR.  Marland Kitchen Sleep apnea   . Gestational diabetes   . Migraines   . Chronic lower back pain   . Anxiety   . Kidney stones   . Anemia     a. Mild with Hgb 11.2, 09/2012. UA with mod Hgb. Instructed to f/u PCP.  Marland Kitchen Obesity   . Anxiety   . Depression   . Panic attack    Past Surgical History  Procedure Laterality Date  . Tubal ligation  2012  . Cesarean section  2012  . Dilation and curettage of uterus  2006; 2010  . Cystoscopy/retrograde/ureteroscopy/stone extraction with basket  1987  . Cystoscopy w/ ureteral stent placement  2006  . Back surgery     Family History  Problem Relation Age of Onset  . Diabetes Mother   . Hypertension Mother   . Migraines Father   . Migraines Brother    Social History  Substance Use Topics  . Smoking status: Never Smoker   . Smokeless tobacco: Never Used  . Alcohol Use: No   OB History    No data available     Review of Systems  Unable to perform ROS: Other      Allergies  Iodides; Imitrex; and Metoclopramide hcl  Home Medications   Prior to Admission  medications   Medication Sig Start Date End Date Taking? Authorizing Provider  aspirin-acetaminophen-caffeine (EXCEDRIN MIGRAINE) [phone] MG tablet Take 2 tablets by mouth every 6 (six) hours as needed for headache.   Yes Historical Provider, MD  atorvastatin (LIPITOR) 40 MG tablet Take 40 mg by mouth daily.   Yes Historical Provider, MD  buprenorphine (BUTRANS) 20 MCG/HR PTWK patch Place 20 mcg onto the skin once a week.   Yes Historical Provider, MD  busPIRone (BUSPAR) 10 MG tablet Take 10 mg by mouth 2 (two) times daily.   Yes Historical Provider, MD  citalopram (CELEXA) 20 MG tablet Take 20 mg by mouth daily.   Yes Historical Provider, MD  esomeprazole (NEXIUM) 20 MG capsule Take 20 mg by mouth daily at 12 noon.   Yes Historical Provider, MD  hydrOXYzine (ATARAX/VISTARIL) 25 MG tablet Take 1 tablet (25 mg total) by mouth every 6 (six) hours as needed for anxiety. Patient taking differently: Take 25-50 mg by mouth every 6 (six) hours as needed for anxiety.  01/01/15  Yes Danelle Berry, PA-C  loratadine (CLARITIN) 10 MG tablet Take 10 mg by mouth daily.   Yes Historical Provider, MD  mirtazapine (REMERON) 15 MG tablet Take 15 mg by mouth at  bedtime.   Yes Historical Provider, MD  prazosin (MINIPRESS) 1 MG capsule Take 1 mg by mouth at bedtime.   Yes Historical Provider, MD  pregabalin (LYRICA) 300 MG capsule Take 300 mg by mouth 3 (three) times daily.   Yes Historical Provider, MD  Tapentadol HCl (NUCYNTA) 100 MG TABS Take 200 mg by mouth every 8 (eight) hours. With an additional 100mg  daily.   Yes Historical Provider, MD  tiZANidine (ZANAFLEX) 4 MG tablet Take 4 mg by mouth 4 (four) times daily.   Yes Historical Provider, MD  traZODone (DESYREL) 100 MG tablet Take 100 mg by mouth at bedtime. Can take up to 400mg  at bedtime   Yes Historical Provider, MD  cephALEXin (KEFLEX) 500 MG capsule Take 1 capsule (500 mg total) by mouth 4 (four) times daily. Patient not taking: Reported on 07/01/2015  05/30/15   Samantha Tripp Dowless, PA-C  DULoxetine 40 MG CPEP Take 40 mg by mouth daily. For depression Patient not taking: Reported on 06/03/2015 11/18/14   Sanjuana KavaAgnes I Nwoko, NP  gabapentin (NEURONTIN) 100 MG capsule Take 1 capsule (100 mg total) by mouth 3 (three) times daily. For agitation Patient not taking: Reported on 01/01/2015 11/18/14   Sanjuana KavaAgnes I Nwoko, NP  traZODone (DESYREL) 50 MG tablet Take 1 tablet (50 mg total) by mouth at bedtime as needed and may repeat dose one time if needed for sleep. Patient not taking: Reported on 06/03/2015 11/18/14   Sanjuana KavaAgnes I Nwoko, NP   BP 136/96 mmHg  Pulse 102  Temp(Src) 98.3 F (36.8 C) (Oral)  Resp 16  SpO2 93%  LMP  Physical Exam  Constitutional: She appears well-developed and well-nourished.  HENT:  Head: Normocephalic and atraumatic.  Neck: Normal range of motion.  Cardiovascular: Normal rate and regular rhythm.   Pulmonary/Chest: Effort normal. No stridor. No respiratory distress. She has no wheezes.  Abdominal: She exhibits no distension.  Musculoskeletal: She exhibits no edema or tenderness.  Neurological: No cranial nerve deficit. Coordination normal.  Skin: Skin is warm and dry.  Nursing note and vitals reviewed.   ED Course  Procedures (including critical care time) Labs Review Labs Reviewed  COMPREHENSIVE METABOLIC PANEL - Abnormal; Notable for the following:    Creatinine, Ser 1.01 (*)    Calcium 8.8 (*)    AST 13 (*)    ALT 13 (*)    All other components within normal limits  ACETAMINOPHEN LEVEL - Abnormal; Notable for the following:    Acetaminophen (Tylenol), Serum <10 (*)    All other components within normal limits  ETHANOL  SALICYLATE LEVEL  CBC  URINE RAPID DRUG SCREEN, HOSP PERFORMED  CBG MONITORING, ED    Imaging Review Ct Head Wo Contrast  07/01/2015  CLINICAL DATA:  Drowsy, possible unintentional overdose EXAM: CT HEAD WITHOUT CONTRAST TECHNIQUE: Contiguous axial images were obtained from the base of the skull  through the vertex without intravenous contrast. COMPARISON:  None. FINDINGS: There is no evidence of mass effect, midline shift or extra-axial fluid collections. There is no evidence of a space-occupying lesion or intracranial hemorrhage. There is no evidence of a cortical-based area of acute infarction. The ventricles and sulci are appropriate for the patient's age. The basal cisterns are patent. Visualized portions of the orbits are unremarkable. The visualized portions of the paranasal sinuses and mastoid air cells are unremarkable. The osseous structures are unremarkable. IMPRESSION: No acute intracranial pathology. Electronically Signed   By: Elige KoHetal  Patel   On: 07/01/2015 16:11   I  have personally reviewed and evaluated these images and lab results as part of my medical decision-making.   EKG Interpretation   Date/Time:  Tuesday Jul 01 2015 09:31:22 EDT Ventricular Rate:  87 PR Interval:  117 QRS Duration: 86 QT Interval:  344 QTC Calculation: 414 R Axis:   10 Text Interpretation:  Sinus rhythm Borderline short PR interval Confirmed  by [facility] MD, Barbara Cower [phone]) on 07/01/2015 9:55:29 AM      MDM   Final diagnoses:  Other fatigue   Sleepy on exam. Reportedly started some new sedative medications. No obvious physiologic abnormalities otherwise, will monitor until patient starts to wake up and obtain more history.   Patient more awake, seems to be polypharmacy as the cause of her symptoms. Able to ambulate. No memory issues. Ct and labs ok, doubt meningitis or other ICH. Will advise holding on celexa (new medication last night) and fu pain doc and PCP to reconcile medications as appropriate.   New Prescriptions: New Prescriptions   No medications on file     I have personally and contemperaneously reviewed labs and imaging and used in my decision making as above.   A medical screening exam was performed and I feel the patient has had an appropriate workup for their chief  complaint at this time and likelihood of emergent condition existing is low and thus workup can continue on an outpatient basis.. Their vital signs are stable. They have been counseled on decision, discharge, follow up and which symptoms necessitate immediate return to the emergency department.  They verbally stated understanding and agreement with plan and discharged in stable condition.     Marily Memos, MD 07/01/15 [phone]

## 2015-07-01 NOTE — Discharge Instructions (Signed)
I am unsure of the cause of yoru sleepiness however it could be related to taking too many sedating medications at night. I would suggest following up with your prescribing doctors and reconcile your medications to reduce some of them.

## 2015-07-01 NOTE — ED Notes (Signed)
MD at bedside. 

## 2015-07-01 NOTE — ED Notes (Signed)
Poison Control closed patient's case.

## 2015-07-01 NOTE — ED Notes (Signed)
Patient ambulated in hallway with NT without difficulty.

## 2015-07-01 NOTE — ED Notes (Signed)
Pt cannot use restroom at this time, aware urine specimen is needed.  

## 2015-07-01 NOTE — ED Notes (Signed)
Blood draw x 2 unsuccessful 

## 2015-07-01 NOTE — ED Notes (Signed)
Discharge instructions and follow up care reviewed with patient. Patient verbalized understanding. 

## 2015-07-01 NOTE — ED Notes (Signed)
Bed: WA02 Expected date:  Expected time:  Means of arrival:  Comments: 3243 - possible OD/sleeping pills

## 2015-07-15 ENCOUNTER — Emergency Department (HOSPITAL_COMMUNITY): Payer: Medicaid Other

## 2015-07-15 ENCOUNTER — Emergency Department (HOSPITAL_COMMUNITY)
Admission: EM | Admit: 2015-07-15 | Discharge: 2015-07-15 | Disposition: A | Discharge status: Home / Self Care | Payer: Medicaid Other | Attending: Emergency Medicine | Admitting: Emergency Medicine

## 2015-07-15 ENCOUNTER — Encounter (HOSPITAL_COMMUNITY): Payer: Self-pay | Admitting: Emergency Medicine

## 2015-07-15 DIAGNOSIS — S0003XA Contusion of scalp, initial encounter: Secondary | ICD-10-CM | POA: Diagnosis not present

## 2015-07-15 DIAGNOSIS — Y9389 Activity, other specified: Secondary | ICD-10-CM | POA: Insufficient documentation

## 2015-07-15 DIAGNOSIS — I10 Essential (primary) hypertension: Secondary | ICD-10-CM | POA: Insufficient documentation

## 2015-07-15 DIAGNOSIS — S0990XA Unspecified injury of head, initial encounter: Secondary | ICD-10-CM | POA: Diagnosis present

## 2015-07-15 DIAGNOSIS — W0110XA Fall on same level from slipping, tripping and stumbling with subsequent striking against unspecified object, initial encounter: Secondary | ICD-10-CM | POA: Insufficient documentation

## 2015-07-15 DIAGNOSIS — Z79899 Other long term (current) drug therapy: Secondary | ICD-10-CM | POA: Diagnosis not present

## 2015-07-15 DIAGNOSIS — Y999 Unspecified external cause status: Secondary | ICD-10-CM | POA: Diagnosis not present

## 2015-07-15 DIAGNOSIS — Y9285 Railroad track as the place of occurrence of the external cause: Secondary | ICD-10-CM | POA: Diagnosis not present

## 2015-07-15 DIAGNOSIS — F329 Major depressive disorder, single episode, unspecified: Secondary | ICD-10-CM | POA: Diagnosis not present

## 2015-07-15 DIAGNOSIS — Z7982 Long term (current) use of aspirin: Secondary | ICD-10-CM | POA: Insufficient documentation

## 2015-07-15 LAB — BASIC METABOLIC PANEL
Anion gap: 5 (ref 5–15)
BUN: 9 mg/dL (ref 6–20)
CALCIUM: 8.8 mg/dL — AB (ref 8.9–10.3)
CO2: 27 mmol/L (ref 22–32)
Chloride: 106 mmol/L (ref 101–111)
Creatinine, Ser: 0.76 mg/dL (ref 0.44–1.00)
GFR calc Af Amer: >60 mL/min (ref >60)
GLUCOSE: 126 mg/dL — AB (ref 65–99)
Potassium: 3.9 mmol/L (ref 3.5–5.1)
Sodium: 138 mmol/L (ref 135–145)

## 2015-07-15 LAB — CBC
HEMATOCRIT: 38.2 % (ref 36.0–46.0)
Hemoglobin: 11.8 g/dL — ABNORMAL LOW (ref 12.0–15.0)
MCH: 25.6 pg — AB (ref 26.0–34.0)
MCHC: 30.9 g/dL (ref 30.0–36.0)
MCV: 82.9 fL (ref 78.0–100.0)
Platelets: 253 10*3/uL (ref 150–400)
RBC: 4.61 MIL/uL (ref 3.87–5.11)
RDW: 14.2 % (ref 11.5–15.5)
WBC: 6.5 10*3/uL (ref 4.0–10.5)

## 2015-07-15 LAB — I-STAT CHEM 8, ED
BUN: 10 mg/dL (ref 6–20)
CALCIUM ION: 1.08 mmol/L — AB (ref 1.12–1.23)
CREATININE: 0.8 mg/dL (ref 0.44–1.00)
Chloride: 103 mmol/L (ref 101–111)
GLUCOSE: 127 mg/dL — AB (ref 65–99)
HCT: 38 % (ref 36.0–46.0)
HEMOGLOBIN: 12.9 g/dL (ref 12.0–15.0)
Potassium: 4 mmol/L (ref 3.5–5.1)
Sodium: 141 mmol/L (ref 135–145)
TCO2: 27 mmol/L (ref 0–100)

## 2015-07-15 MED ORDER — PROMETHAZINE HCL 25 MG PO TABS: 25.0000 mg | ORAL_TABLET | Freq: Four times a day (QID) | ORAL | Status: DC | PRN

## 2015-07-15 MED ORDER — PROMETHAZINE HCL 25 MG PO TABS
25.0000 mg | ORAL_TABLET | Freq: Once | ORAL | Status: AC
  Administered 2015-07-15: 25 mg via ORAL
  Filled 2015-07-15: qty 1

## 2015-07-15 NOTE — Discharge Instructions (Signed)
1. Medications: Alternate between ibuprofen and tylenol as needed for pain. Phenergan as needed for nausea.  2. Treatment: Rest, ice on head.  Concussion precautions given: Stay in a quiet, non-simulating, dark environment. No TV, computer use, video games until headache is resolved completely.  3. Follow Up: If symptoms persist longer than one week, follow up with the neurologist listed.  Return to the emergency department if you begin vomiting or other change in mental status.  HEAD INJURY If any of the following occur notify your physician or go to the Hospital Emergency Department:  Increased drowsiness, stupor or loss of consciousness  Restlessness or convulsions (fits)  Paralysis in arms or legs  Temperature above 100 F  Vomiting  Severe headache  Blood or clear fluid dripping from the nose or ears  Stiffness of the neck  Dizziness or blurred vision  Pulsating pain in the eye  Unequal pupils of eye  Personality changes  Any other unusual symptoms  PRECAUTIONS  Keep head elevated at all times for the first 24 hours (Elevate mattress if pillow is ineffective)  Do not take sedatives, narcotics or alcohol  Avoid aspirin. Use only acetaminophen (e.g. Tylenol) or ibuprofen (e.g. Advil) for relief of pain. Follow directions on the bottle for dosage.  Use ice packs for comfort  MEDICATIONS Use medications only as directed by your physician.

## 2015-07-15 NOTE — ED Provider Notes (Signed)
CSN: [id number]     Arrival date & time 07/15/15  1630 History   First MD Initiated Contact with Patient 07/15/15 2058     Chief Complaint  Patient presents with  . Fall  . Head Injury    (Consider location/radiation/quality/duration/timing/severity/associated sxs/prior Treatment) Patient is a 45 y.o. female presenting with fall and head injury. The history is provided by the patient and medical records. No language interpreter was used.  Fall Associated symptoms include headaches and nausea. Pertinent negatives include no abdominal pain, chills, congestion, coughing, fever, neck pain, vomiting or weakness.  Head Injury Associated symptoms: headache and nausea   Associated symptoms: no neck pain and no vomiting    Sandra [name] is a 45 y.o. female  who presents to the Emergency Department complaining of head injury. Patient states she [place] tracks earlier today when she accidentally fell backwards and hit her posterior head. Denies LOC. Admits to nausea, dizziness, and headache since incident. No vomiting, no bleeding. Not on blood thinners. No change in mental status. Tylenol taken for pain with no relief. No alleviating or aggravating factors noted.    Past Medical History  Diagnosis Date  . GERD (gastroesophageal reflux disease)   . Gastroparesis   . Hypertension   . Restless legs   . H/O echocardiogram     a. 02/2010: done for htn/tachy while pregnant - EF 55-60%, grade 1 d/dysf, mildly dilated LA.  . High cholesterol   . Atrial fibrillation with RVR (HCC)     a. Dx 09/2012, spont conv to NSR.  Marland Kitchen Sleep apnea   . Gestational diabetes   . Migraines   . Chronic lower back pain   . Anxiety   . Kidney stones   . Anemia     a. Mild with Hgb 11.2, 09/2012. UA with mod Hgb. Instructed to f/u PCP.  Marland Kitchen Obesity   . Anxiety   . Depression   . Panic attack    Past Surgical History  Procedure Laterality Date  . Tubal ligation  2012  . Cesarean section  2012    . Dilation and curettage of uterus  2006; 2010  . Cystoscopy/retrograde/ureteroscopy/stone extraction with basket  1987  . Cystoscopy w/ ureteral stent placement  2006  . Back surgery     Family History  Problem Relation Age of Onset  . Diabetes Mother   . Hypertension Mother   . Migraines Father   . Migraines Brother    Social History  Substance Use Topics  . Smoking status: Never Smoker   . Smokeless tobacco: Never Used  . Alcohol Use: No   OB History    No data available     Review of Systems  Constitutional: Negative for fever and chills.  HENT: Negative for congestion.   Eyes: Negative for visual disturbance.  Respiratory: Negative for cough and shortness of breath.   Cardiovascular: Negative.   Gastrointestinal: Positive for nausea. Negative for vomiting and abdominal pain.  Genitourinary: Negative for dysuria.  Musculoskeletal: Negative for back pain and neck pain.  Skin: Positive for wound (hematoma of posterior head).  Neurological: Positive for dizziness and headaches. Negative for weakness.      Allergies  Iodides; Imitrex; and Metoclopramide hcl  Home Medications   Prior to Admission medications   Medication Sig Start Date End Date Taking? Authorizing Provider  aspirin-acetaminophen-caffeine (EXCEDRIN MIGRAINE) [phone] MG tablet Take 2 tablets by mouth every 6 (six) hours as needed for headache.   Yes  Historical Provider, MD  atorvastatin (LIPITOR) 40 MG tablet Take 40 mg by mouth daily.   Yes Historical Provider, MD  buprenorphine (BUTRANS) 20 MCG/HR PTWK patch Place 20 mcg onto the skin once a week.   Yes Historical Provider, MD  busPIRone (BUSPAR) 10 MG tablet Take 10 mg by mouth 2 (two) times daily.   Yes Historical Provider, MD  citalopram (CELEXA) 20 MG tablet Take 20 mg by mouth daily.   Yes Historical Provider, MD  hydrOXYzine (ATARAX/VISTARIL) 25 MG tablet Take 1 tablet (25 mg total) by mouth every 6 (six) hours as needed for  anxiety. Patient taking differently: Take 25-50 mg by mouth every 6 (six) hours as needed for anxiety.  01/01/15  Yes [name], PA-C  loratadine (CLARITIN) 10 MG tablet Take 10 mg by mouth daily.   Yes Historical Provider, MD  mirtazapine (REMERON) 15 MG tablet Take 15 mg by mouth at bedtime.   Yes Historical Provider, MD  pregabalin (LYRICA) 300 MG capsule Take 300 mg by mouth 3 (three) times daily.   Yes Historical Provider, MD  Tapentadol HCl (NUCYNTA) 100 MG TABS Take 200 mg by mouth every 8 (eight) hours. With an additional 100mg  daily.   Yes Historical Provider, MD  tiZANidine (ZANAFLEX) 4 MG tablet Take 4 mg by mouth 4 (four) times daily.   Yes Historical Provider, MD  traZODone (DESYREL) 100 MG tablet Take 100 mg by mouth at bedtime. Can take up to 400mg  at bedtime   Yes Historical Provider, MD  promethazine (PHENERGAN) 25 MG tablet Take 1 tablet (25 mg total) by mouth every 6 (six) hours as needed for nausea. 07/15/15   [name], PA-C   BP 144/97 mmHg  Pulse 87  Temp(Src) 98.4 F (36.9 C) (Oral)  Resp 18  SpO2 97%  LMP 07/15/2015 Physical Exam  Constitutional: She is oriented to person, place, and time. She appears well-developed and well-nourished.  Alert and in no acute distress  HENT:  Head: Normocephalic. Head is without raccoon's eyes and without Battle's sign.    Right Ear: No hemotympanum.  Left Ear: No hemotympanum.  Neck:  No midline or paraspinal tenderness. Full ROM without pain.   Cardiovascular: Normal rate, regular rhythm, normal heart sounds and intact distal pulses.  Exam reveals no gallop and no friction rub.   No murmur heard. Pulmonary/Chest: Effort normal and breath sounds normal. No respiratory distress.  Abdominal: Soft. Bowel sounds are normal. She exhibits no distension. There is no tenderness.  Musculoskeletal: Normal range of motion.  Neurological: She is alert and oriented to person, place, and time.  Alert, oriented, thought content  appropriate, able to give a coherent history. Speech is clear and goal oriented, able to follow commands.  CN II-XII grossly intact.  Normal gait and balance.   Skin: Skin is warm and dry.  Nursing note and vitals reviewed.   ED Course  Procedures (including critical care time) Labs Review Labs Reviewed  CBC - Abnormal; Notable for the following:    Hemoglobin 11.8 (*)    MCH 25.6 (*)    All other components within normal limits  BASIC METABOLIC PANEL - Abnormal; Notable for the following:    Glucose, Bld 126 (*)    Calcium 8.8 (*)    All other components within normal limits  I-STAT CHEM 8, ED - Abnormal; Notable for the following:    Glucose, Bld 127 (*)    Calcium, Ion 1.08 (*)    All other components within  normal limits    Imaging Review Ct Head Wo Contrast  07/15/2015  CLINICAL DATA:  Status post fall backwards off railroad tie, hitting back of head on rock. Loss of consciousness. Dizziness and nausea. Initial encounter. EXAM: CT HEAD WITHOUT CONTRAST TECHNIQUE: Contiguous axial images were obtained from the base of the skull through the vertex without intravenous contrast. COMPARISON:  CT of the head performed 07/01/2015 FINDINGS: There is no evidence of acute infarction, mass lesion, or intra- or extra-axial hemorrhage on CT. The posterior fossa, including the cerebellum, brainstem and fourth ventricle, is within normal limits. The third and lateral ventricles, and basal ganglia are unremarkable in appearance. The cerebral hemispheres are symmetric in appearance, with normal gray-white differentiation. No mass effect or midline shift is seen. There is no evidence of fracture; visualized osseous structures are unremarkable in appearance. Volume averaging is noted at the left parietal lobe. The orbits are within normal limits. The paranasal sinuses and mastoid air cells are well-aerated. Mild soft tissue swelling is noted at the occiput. IMPRESSION: 1. No acute intracranial pathology  seen on CT. 2. Mild soft tissue swelling at the occiput. Electronically Signed   By: Roanna Raider M.D.   On: 07/15/2015 19:43   I have personally reviewed and evaluated these images and lab results as part of my medical decision-making.   EKG Interpretation None      MDM   Final diagnoses:  Head injury, initial encounter   Sandra [name] presents to ED for head injury which occurred just PTA. Was sitting on railroad tracks and fell backward hitting head. No LOC. No focal neuro deficits on exam. + dizziness, nausea, no emesis. CT head with no acute intracranial abnormality. Mild occipital soft tissue swelling. CBC & BMP reassuring. Patient states zofran does not work for nausea, one dose phenergan given. Patient re-evaluated and states nausea and dizziness improved. Ambulated without difficulty. Concussion home care symptoms discussed. Return precautions given. Neuro follow up if symptoms persist > 1 week. Short course Phenergan given for nausea. All questions answered.   [facility], PA-C 07/15/15 1610  Bethann Berkshire, MD 07/17/15 [phone]

## 2015-07-15 NOTE — ED Notes (Signed)
Pt sts sitting on rail road tracks today and fell backwards hitting head on metal; pt appears sleepy and requires sternal rub x 2 with effect; pt answers all questions appropriately; small scratch noted to back of head; pt denies any substance use today

## 2015-07-15 NOTE — ED Notes (Signed)
Pt ambulated to bathroom independently without difficulty.

## 2015-08-04 ENCOUNTER — Emergency Department (HOSPITAL_COMMUNITY)
Admission: EM | Admit: 2015-08-04 | Discharge: 2015-08-04 | Disposition: A | Discharge status: Home / Self Care | Payer: Medicaid Other | Attending: Emergency Medicine | Admitting: Emergency Medicine

## 2015-08-04 ENCOUNTER — Encounter (HOSPITAL_COMMUNITY): Payer: Self-pay | Admitting: Emergency Medicine

## 2015-08-04 DIAGNOSIS — Z7982 Long term (current) use of aspirin: Secondary | ICD-10-CM | POA: Diagnosis not present

## 2015-08-04 DIAGNOSIS — Y929 Unspecified place or not applicable: Secondary | ICD-10-CM | POA: Insufficient documentation

## 2015-08-04 DIAGNOSIS — I4891 Unspecified atrial fibrillation: Secondary | ICD-10-CM | POA: Diagnosis not present

## 2015-08-04 DIAGNOSIS — Y999 Unspecified external cause status: Secondary | ICD-10-CM | POA: Diagnosis not present

## 2015-08-04 DIAGNOSIS — S61412A Laceration without foreign body of left hand, initial encounter: Secondary | ICD-10-CM | POA: Insufficient documentation

## 2015-08-04 DIAGNOSIS — Y9389 Activity, other specified: Secondary | ICD-10-CM | POA: Insufficient documentation

## 2015-08-04 DIAGNOSIS — Z79899 Other long term (current) drug therapy: Secondary | ICD-10-CM | POA: Diagnosis not present

## 2015-08-04 DIAGNOSIS — F329 Major depressive disorder, single episode, unspecified: Secondary | ICD-10-CM | POA: Diagnosis not present

## 2015-08-04 DIAGNOSIS — I10 Essential (primary) hypertension: Secondary | ICD-10-CM | POA: Insufficient documentation

## 2015-08-04 DIAGNOSIS — W268XXA Contact with other sharp object(s), not elsewhere classified, initial encounter: Secondary | ICD-10-CM | POA: Diagnosis not present

## 2015-08-04 MED ORDER — LIDOCAINE HCL (PF) 1 % IJ SOLN
10.0000 mL | Freq: Once | INTRAMUSCULAR | Status: AC
  Administered 2015-08-04: 10 mL
  Filled 2015-08-04: qty 30

## 2015-08-04 MED ORDER — TETANUS-DIPHTH-ACELL PERTUSSIS 5-2.5-18.5 LF-MCG/0.5 IM SUSP
0.5000 mL | Freq: Once | INTRAMUSCULAR | Status: AC
  Administered 2015-08-04: 0.5 mL via INTRAMUSCULAR
  Filled 2015-08-04: qty 0.5

## 2015-08-04 MED ORDER — LIDOCAINE HCL (PF) 1 % IJ SOLN
10.0000 mL | Freq: Once | INTRAMUSCULAR | Status: AC
  Administered 2015-08-04: 10 mL via INTRADERMAL
  Filled 2015-08-04: qty 30

## 2015-08-04 NOTE — ED Provider Notes (Signed)
CSN: [id number]     Arrival date & time 08/04/15  [id number] History   First MD Initiated Contact with Patient 08/04/15 1013     Chief Complaint  Patient presents with  . Extremity Laceration     (Consider location/radiation/quality/duration/timing/severity/associated sxs/prior Treatment) HPI   Patient is a 45 year old female who presents the ED with a laceration to her left hand that occurred roughly around 9:30 this morning. Patient states that she was breaking wood when she stepped on a piece of wood that she had in her left hand and a nail cut her hand. Patient states throbbing, constant pain that is nonradiating. She isn't taking anything for the pain. Bleeding is well controlled. Patient denies numbness/tingling or weakness of her hand or fingers.  Past Medical History  Diagnosis Date  . GERD (gastroesophageal reflux disease)   . Gastroparesis   . Hypertension   . Restless legs   . H/O echocardiogram     a. 02/2010: done for htn/tachy while pregnant - EF 55-60%, grade 1 d/dysf, mildly dilated LA.  . High cholesterol   . Atrial fibrillation with RVR (HCC)     a. Dx 09/2012, spont conv to NSR.  Marland Kitchen. Sleep apnea   . Gestational diabetes   . Migraines   . Chronic lower back pain   . Anxiety   . Kidney stones   . Anemia     a. Mild with Hgb 11.2, 09/2012. UA with mod Hgb. Instructed to f/u PCP.  Marland Kitchen. Obesity   . Anxiety   . Depression   . Panic attack    Past Surgical History  Procedure Laterality Date  . Tubal ligation  2012  . Cesarean section  2012  . Dilation and curettage of uterus  2006; 2010  . Cystoscopy/retrograde/ureteroscopy/stone extraction with basket  1987  . Cystoscopy w/ ureteral stent placement  2006  . Back surgery     Family History  Problem Relation Age of Onset  . Diabetes Mother   . Hypertension Mother   . Migraines Father   . Migraines Brother    Social History  Substance Use Topics  . Smoking status: Never Smoker   . Smokeless tobacco: Never Used  .  Alcohol Use: No   OB History    No data available     Review of Systems  Gastrointestinal: Negative for nausea and vomiting.  Musculoskeletal: Negative for joint swelling.  Skin: Positive for wound.  Neurological: Negative for weakness and numbness.      Allergies  Iodides; Imitrex; and Metoclopramide hcl  Home Medications   Prior to Admission medications   Medication Sig Start Date End Date Taking? Authorizing Provider  aspirin-acetaminophen-caffeine (EXCEDRIN MIGRAINE) 724 [id number] MG tablet Take 2 tablets by mouth every 6 (six) hours as needed for headache.   Yes Historical Provider, MD  buprenorphine (BUTRANS) 20 MCG/HR PTWK patch Place 20 mcg onto the skin once a week. When needed for pain or addiction to opiods   Yes Historical Provider, MD  busPIRone (BUSPAR) 10 MG tablet Take 10 mg by mouth 2 (two) times daily.   Yes Historical Provider, MD  citalopram (CELEXA) 20 MG tablet Take 20 mg by mouth daily.   Yes Historical Provider, MD  hydrOXYzine (ATARAX/VISTARIL) 25 MG tablet Take 1 tablet (25 mg total) by mouth every 6 (six) hours as needed for anxiety. Patient taking differently: Take 25-50 mg by mouth every 6 (six) hours as needed for anxiety.  01/01/15  Yes [name], PA-C  loratadine (CLARITIN) 10  MG tablet Take 10 mg by mouth daily as needed for allergies.    Yes Historical Provider, MD  mirtazapine (REMERON) 15 MG tablet Take 15 mg by mouth at bedtime.   Yes Historical Provider, MD  promethazine (PHENERGAN) 25 MG tablet Take 1 tablet (25 mg total) by mouth every 6 (six) hours as needed for nausea. 07/15/15  Yes [name], PA-C  tiZANidine (ZANAFLEX) 4 MG tablet Take 4 mg by mouth 4 (four) times daily.   Yes Historical Provider, MD  traZODone (DESYREL) 100 MG tablet Take 100-400 mg by mouth at bedtime. And if needed Can take up to 400mg  at bedtime for sleep   Yes Historical Provider, MD  atorvastatin (LIPITOR) 40 MG tablet Take 40 mg by mouth daily. Reported on  08/04/2015    Historical Provider, MD  pregabalin (LYRICA) 300 MG capsule Take 300 mg by mouth 3 (three) times daily as needed (pain).     Historical Provider, MD  Tapentadol HCl (NUCYNTA) 100 MG TABS Take 200 mg by mouth every 8 (eight) hours as needed (pain).     Historical Provider, MD   BP 129/94 mmHg  Pulse 80  Temp(Src) 98.3 F (36.8 C) (Oral)  Resp 18  Ht 5\' 4"  (1.626 m)  Wt 81.647 kg  BMI 30.88 kg/m2  SpO2 100%  LMP 07/01/2015 Physical Exam  Constitutional: She appears well-developed and well-nourished. No distress.  HENT:  Head: Normocephalic and atraumatic.  Eyes: Conjunctivae are normal.  Cardiovascular:  Pulses:      Radial pulses are 2+ on the right side, and 2+ on the left side.  Pulmonary/Chest: Effort normal.  Musculoskeletal:  Examination of left hand reveals a roughly a 3 cm straight laceration on the left palm, no surrounding erythema or edema, neurovascularly intact distally.  Neurological: She is alert. Coordination normal.  Skin: Skin is warm and dry. She is not diaphoretic.  Psychiatric: She has a normal mood and affect. Her behavior is normal.  Nursing note and vitals reviewed.   ED Course  .Marland KitchenLaceration Repair Date/Time: 08/04/2015 1:00 PM Performed by: Rhona Raider, JESSICA L Authorized by: Mattie Marlin L Consent: Verbal consent obtained. Risks and benefits: risks, benefits and alternatives were discussed Consent given by: patient Patient understanding: patient states understanding of the procedure being performed Required items: required blood products, implants, devices, and special equipment available Patient identity confirmed: verbally with patient and arm band Time out: Immediately prior to procedure a "time out" was called to verify the correct patient, procedure, equipment, support staff and site/side marked as required. Body area: upper extremity Location details: left hand Laceration length: 3 cm Foreign bodies: no foreign bodies Tendon  involvement: none Nerve involvement: none Vascular damage: no Anesthesia: local infiltration Local anesthetic: lidocaine 1% with epinephrine Anesthetic total: 7 ml Patient sedated: no Preparation: Patient was prepped and draped in the usual sterile fashion. Irrigation solution: saline Irrigation method: jet lavage Amount of cleaning: standard Debridement: none Degree of undermining: none Skin closure: 3-0 Prolene Number of sutures: 6 Technique: simple Approximation: close Approximation difficulty: simple Dressing: antibiotic ointment and 4x4 sterile gauze Patient tolerance: Patient tolerated the procedure well with no immediate complications   (including critical care time) Labs Review Labs Reviewed - No data to display  Imaging Review No results found. I have personally reviewed and evaluated these images and lab results as part of my medical decision-making.   EKG Interpretation None      MDM   Final diagnoses:  Laceration of hand, left, initial  encounter    Tdap booster given.Pressure irrigation performed. Laceration occurred < 8 hours prior to repair which was well tolerated. Pt has no co morbidities to effect normal wound healing. Discussed suture home care w pt and answered questions. Pt to f-u for wound check and suture removal in 7-10 days. Discussed strict return precautions to include signs of infection. Pt is hemodynamically stable w no complaints prior to dc and expressed understanding to the discharge instructions.      Jerre Simon, PA 08/04/15 1345  Lorre Nick, MD 08/04/15 1525

## 2015-08-04 NOTE — ED Notes (Signed)
Per patient, patient states she was breaking wood in half and split hand on nail.  Tetanus shot is not update.

## 2015-08-04 NOTE — Discharge Instructions (Signed)
Follow-up with your primary care provider or here at the emergency department to have your sutures removed within 7-10 days.  Follow-up sooner if you experience signs of infection to include increased redness around the wound, swelling, increased pain, red streaks, fever, numbness/tingling or weakness of your hand or fingers.  Laceration Care, Adult A laceration is a cut that goes through all of the layers of the skin and into the tissue that is right under the skin. Some lacerations heal on their own. Others need to be closed with stitches (sutures), staples, skin adhesive strips, or skin glue. Proper laceration care minimizes the risk of infection and helps the laceration to heal better. HOW TO CARE FOR YOUR LACERATION If sutures or staples were used:  Keep the wound clean and dry.  If you were given a bandage (dressing), you should change it at least one time per day or as told by your health care provider. You should also change it if it becomes wet or dirty.  Keep the wound completely dry for the first 24 hours or as told by your health care provider. After that time, you may shower or bathe. However, make sure that the wound is not soaked in water until after the sutures or staples have been removed.  Clean the wound one time each day or as told by your health care provider:  Wash the wound with soap and water.  Rinse the wound with water to remove all soap.  Pat the wound dry with a clean towel. Do not rub the wound.  After cleaning the wound, apply a thin layer of antibiotic ointmentas told by your health care provider. This will help to prevent infection and keep the dressing from sticking to the wound.  Have the sutures or staples removed as told by your health care provider. If skin adhesive strips were used:  Keep the wound clean and dry.  If you were given a bandage (dressing), you should change it at least one time per day or as told by your health care provider. You  should also change it if it becomes dirty or wet.  Do not get the skin adhesive strips wet. You may shower or bathe, but be careful to keep the wound dry.  If the wound gets wet, pat it dry with a clean towel. Do not rub the wound.  Skin adhesive strips fall off on their own. You may trim the strips as the wound heals. Do not remove skin adhesive strips that are still stuck to the wound. They will fall off in time. If skin glue was used:  Try to keep the wound dry, but you may briefly wet it in the shower or bath. Do not soak the wound in water, such as by swimming.  After you have showered or bathed, gently pat the wound dry with a clean towel. Do not rub the wound.  Do not do any activities that will make you sweat heavily until the skin glue has fallen off on its own.  Do not apply liquid, cream, or ointment medicine to the wound while the skin glue is in place. Using those may loosen the film before the wound has healed.  If you were given a bandage (dressing), you should change it at least one time per day or as told by your health care provider. You should also change it if it becomes dirty or wet.  If a dressing is placed over the wound, be careful not to apply tape  directly over the skin glue. Doing that may cause the glue to be pulled off before the wound has healed.  Do not pick at the glue. The skin glue usually remains in place for 5-10 days, then it falls off of the skin. General Instructions  Take over-the-counter and prescription medicines only as told by your health care provider.  If you were prescribed an antibiotic medicine or ointment, take or apply it as told by your doctor. Do not stop using it even if your condition improves.  To help prevent scarring, make sure to cover your wound with sunscreen whenever you are outside after stitches are removed, after adhesive strips are removed, or when glue remains in place and the wound is healed. Make sure to wear a sunscreen  of at least 30 SPF.  Do not scratch or pick at the wound.  Keep all follow-up visits as told by your health care provider. This is important.  Check your wound every day for signs of infection. Watch for:  Redness, swelling, or pain.  Fluid, blood, or pus.  Raise (elevate) the injured area above the level of your heart while you are sitting or lying down, if possible. SEEK MEDICAL CARE IF:  You received a tetanus shot and you have swelling, severe pain, redness, or bleeding at the injection site.  You have a fever.  A wound that was closed breaks open.  You notice a bad smell coming from your wound or your dressing.  You notice something coming out of the wound, such as wood or glass.  Your pain is not controlled with medicine.  You have increased redness, swelling, or pain at the site of your wound.  You have fluid, blood, or pus coming from your wound.  You notice a change in the color of your skin near your wound.  You need to change the dressing frequently due to fluid, blood, or pus draining from the wound.  You develop a new rash.  You develop numbness around the wound. SEEK IMMEDIATE MEDICAL CARE IF:  You develop severe swelling around the wound.  Your pain suddenly increases and is severe.  You develop painful lumps near the wound or on skin that is anywhere on your body.  You have a red streak going away from your wound.  The wound is on your hand or foot and you cannot properly move a finger or toe.  The wound is on your hand or foot and you notice that your fingers or toes look pale or bluish.   This information is not intended to replace advice given to you by your health care provider. Make sure you discuss any questions you have with your health care provider.   Document Released: 02/01/2005 Document Revised: 06/18/2014 Document Reviewed: 01/28/2014 Elsevier Interactive Patient Education [employer]

## 2015-08-05 DIAGNOSIS — Z79899 Other long term (current) drug therapy: Secondary | ICD-10-CM

## 2015-08-12 NOTE — Congregational Nurse Program (Signed)
Congregational Nurse Program Note  Date of Encounter: 08/05/2015  Past Medical History: No past medical history on file.  Encounter Details:     CNP Questionnaire - 08/12/15 1737    Patient Demographics   Is this a new or existing patient? New   Race Caucasian/White   Patient Assistance   Location of Patient Assistance Not Applicable   Patient's financial/insurance status Medicaid   Uninsured Patient Yes   Interventions Counseled to make appt. with provider   Patient referred to apply for the following financial assistance Not Applicable;Medicaid   Food insecurities addressed Not Applicable   Transportation assistance No   Assistance securing medications No   Educational health offerings Medications   Encounter Details   Primary purpose of visit Education/Health Concerns   Was an Emergency Department visit averted? Not Applicable   Patient referred to Establish PCP   Was a mental health screening completed? (GAINS tool) No   Does patient have dental issues? No   Does patient have vision issues? No   Does your patient have an abnormal blood pressure today? No   Since previous encounter, have you referred patient for abnormal blood pressure that resulted in a new diagnosis or medication change? No   Does your patient have an abnormal blood glucose today? No   Since previous encounter, have you referred patient for abnormal blood glucose that resulted in a new diagnosis or medication change? No   Was there a life-saving intervention made? No     Case manager  for intake needed assistance identifying  Medications  Client is taking  . Nurse unable to  Identify  Will  Take  By  pharmacy to  Get assistance with identifying.Pharmacist assisted with  Identifying one of the medications Liptor  40  Mg and returned  That  medication to the client . Pharmacist  And nurse unable  To identify other medication and we were unable to  Give that  Medication back to  Client. [facility] that   [facility] . If  It is  Still here we will return it to her.  Client taking  Nexum, Butrans 20mg , Sertaline HCL , Citalopram, Liptor,  Trazdone, promethazine 25mg , Prazosin HCL, Doxepin,  Lyrica,  Miriazapine 15mg ,  Hydroxyzine,Clartin, Biotin 100mg  . Client states she knows  What al her medications are,but it was in an unmarked  Bottle .    Encouraged to call  To schedule appointment with a primary care provider ,gave client Jovita KussmaulEvans Blount  Number and flier and was instructed to come back and see the nurse next week after making appointment.  Follow  For compliance. Client taking a lot of medications ,but no consistent  Medical care.

## 2015-08-16 ENCOUNTER — Emergency Department (HOSPITAL_COMMUNITY)
Admission: EM | Admit: 2015-08-16 | Discharge: 2015-08-16 | Disposition: A | Discharge status: Home / Self Care | Payer: Medicaid Other | Attending: Emergency Medicine | Admitting: Emergency Medicine

## 2015-08-16 DIAGNOSIS — Z760 Encounter for issue of repeat prescription: Secondary | ICD-10-CM | POA: Diagnosis not present

## 2015-08-16 DIAGNOSIS — Z7982 Long term (current) use of aspirin: Secondary | ICD-10-CM | POA: Diagnosis not present

## 2015-08-16 DIAGNOSIS — X58XXXA Exposure to other specified factors, initial encounter: Secondary | ICD-10-CM | POA: Insufficient documentation

## 2015-08-16 DIAGNOSIS — T148XXA Other injury of unspecified body region, initial encounter: Secondary | ICD-10-CM

## 2015-08-16 DIAGNOSIS — Y939 Activity, unspecified: Secondary | ICD-10-CM | POA: Insufficient documentation

## 2015-08-16 DIAGNOSIS — L089 Local infection of the skin and subcutaneous tissue, unspecified: Secondary | ICD-10-CM

## 2015-08-16 DIAGNOSIS — I480 Paroxysmal atrial fibrillation: Secondary | ICD-10-CM | POA: Insufficient documentation

## 2015-08-16 DIAGNOSIS — I10 Essential (primary) hypertension: Secondary | ICD-10-CM | POA: Insufficient documentation

## 2015-08-16 DIAGNOSIS — F329 Major depressive disorder, single episode, unspecified: Secondary | ICD-10-CM | POA: Diagnosis not present

## 2015-08-16 DIAGNOSIS — Y999 Unspecified external cause status: Secondary | ICD-10-CM | POA: Diagnosis not present

## 2015-08-16 DIAGNOSIS — Z79899 Other long term (current) drug therapy: Secondary | ICD-10-CM | POA: Diagnosis not present

## 2015-08-16 DIAGNOSIS — Y929 Unspecified place or not applicable: Secondary | ICD-10-CM | POA: Diagnosis not present

## 2015-08-16 DIAGNOSIS — S61412A Laceration without foreign body of left hand, initial encounter: Secondary | ICD-10-CM | POA: Insufficient documentation

## 2015-08-16 MED ORDER — DEXLANSOPRAZOLE 30 MG PO CPDR: 30.0000 mg | DELAYED_RELEASE_CAPSULE | Freq: Every day | ORAL | Status: DC

## 2015-08-16 MED ORDER — CLINDAMYCIN HCL 150 MG PO CAPS
450.0000 mg | ORAL_CAPSULE | Freq: Three times a day (TID) | ORAL | Status: DC
Start: 2015-08-16 — End: 2016-03-16

## 2015-08-16 MED ORDER — LIDOCAINE-EPINEPHRINE-TETRACAINE (LET) SOLUTION
3.0000 mL | Freq: Once | NASAL | Status: AC
  Administered 2015-08-16: 3 mL via TOPICAL
  Filled 2015-08-16: qty 3

## 2015-08-16 MED ORDER — PANTOPRAZOLE SODIUM 40 MG PO TBEC
40.0000 mg | DELAYED_RELEASE_TABLET | Freq: Once | ORAL | Status: AC
  Administered 2015-08-16: 40 mg via ORAL
  Filled 2015-08-16: qty 1

## 2015-08-16 MED ORDER — BUPIVACAINE HCL (PF) 0.5 % IJ SOLN
50.0000 mL | Freq: Once | INTRAMUSCULAR | Status: AC
  Administered 2015-08-16: 30 mL
  Filled 2015-08-16: qty 60

## 2015-08-16 MED ORDER — LIDOCAINE HCL (PF) 1 % IJ SOLN
5.0000 mL | Freq: Once | INTRAMUSCULAR | Status: AC
  Administered 2015-08-16: 14:00:00 via INTRADERMAL
  Filled 2015-08-16: qty 30

## 2015-08-16 MED ORDER — MORPHINE SULFATE (PF) 4 MG/ML IV SOLN
4.0000 mg | Freq: Once | INTRAVENOUS | Status: AC
  Administered 2015-08-16: 4 mg via INTRAMUSCULAR
  Filled 2015-08-16: qty 1

## 2015-08-16 MED ORDER — CLINDAMYCIN HCL 300 MG PO CAPS
450.0000 mg | ORAL_CAPSULE | Freq: Once | ORAL | Status: AC
  Administered 2015-08-16: 450 mg via ORAL
  Filled 2015-08-16: qty 1

## 2015-08-16 NOTE — ED Provider Notes (Signed)
CSN: [id number]     Arrival date & time 08/16/15  1219 History   First MD Initiated Contact with Patient 08/16/15 1257     Chief Complaint  Patient presents with  . Suture / Staple Removal  . Medication Refill     (Consider location/radiation/quality/duration/timing/severity/associated sxs/prior Treatment) HPI   Blood pressure 134/103, pulse 93, temperature 98.1 F (36.7 C), temperature source Oral, resp. rate 18, last menstrual period 07/01/2015, SpO2 99 %.  Sandra [name] is a 45 y.o. female complaining of infected laceration to palm of left hand. Suture was placed here proximally 10 days ago, tetanus was updated. States that the area has been becoming more red and painful, there is purulent discharge from the distal end of the wound onset 2 days ago she denies streaking up the arm, fever, chills. Patient is right-hand dominant. She is also requesting refill on her Dexilant, she was dismissed from [facility] for multiple missed visits.  Past Medical History  Diagnosis Date  . GERD (gastroesophageal reflux disease)   . Gastroparesis   . Hypertension   . Restless legs   . H/O echocardiogram     a. 02/2010: done for htn/tachy while pregnant - EF 55-60%, grade 1 d/dysf, mildly dilated LA.  . High cholesterol   . Atrial fibrillation with RVR (HCC)     a. Dx 09/2012, spont conv to NSR.  Marland Kitchen. Sleep apnea   . Gestational diabetes   . Migraines   . Chronic lower back pain   . Anxiety   . Kidney stones   . Anemia     a. Mild with Hgb 11.2, 09/2012. UA with mod Hgb. Instructed to f/u PCP.  Marland Kitchen. Obesity   . Anxiety   . Depression   . Panic attack    Past Surgical History  Procedure Laterality Date  . Tubal ligation  2012  . Cesarean section  2012  . Dilation and curettage of uterus  2006; 2010  . Cystoscopy/retrograde/ureteroscopy/stone extraction with basket  1987  . Cystoscopy w/ ureteral stent placement  2006  . Back surgery     Family History  Problem Relation Age of  Onset  . Diabetes Mother   . Hypertension Mother   . Migraines Father   . Migraines Brother    Social History  Substance Use Topics  . Smoking status: Never Smoker   . Smokeless tobacco: Never Used  . Alcohol Use: No   OB History    No data available     Review of Systems  10 systems reviewed and found to be negative, except as noted in the HPI.   Allergies  Iodides; Imitrex; and Metoclopramide hcl  Home Medications   Prior to Admission medications   Medication Sig Start Date End Date Taking? Authorizing Provider  aspirin-acetaminophen-caffeine (EXCEDRIN MIGRAINE) [phone] MG tablet Take 2 tablets by mouth every 6 (six) hours as needed for headache.   Yes Historical Provider, MD  atorvastatin (LIPITOR) 40 MG tablet Take 40 mg by mouth daily. Reported on 08/04/2015   Yes Historical Provider, MD  buprenorphine (BUTRANS) 20 MCG/HR PTWK patch Place 20 mcg onto the skin once a week. When needed for pain or addiction to opiods   Yes Historical Provider, MD  busPIRone (BUSPAR) 10 MG tablet Take 10 mg by mouth 2 (two) times daily.   Yes Historical Provider, MD  citalopram (CELEXA) 20 MG tablet Take 20 mg by mouth daily.   Yes Historical Provider, MD  hydrOXYzine (ATARAX/VISTARIL) 25 MG tablet  Take 1 tablet (25 mg total) by mouth every 6 (six) hours as needed for anxiety. Patient taking differently: Take 25-50 mg by mouth every 6 (six) hours as needed for anxiety.  01/01/15  Yes [name], PA-C  loratadine (CLARITIN) 10 MG tablet Take 10 mg by mouth daily as needed for allergies.    Yes Historical Provider, MD  mirtazapine (REMERON) 15 MG tablet Take 15 mg by mouth at bedtime.   Yes Historical Provider, MD  pregabalin (LYRICA) 300 MG capsule Take 300 mg by mouth 3 (three) times daily as needed (pain).    Yes Historical Provider, MD  promethazine (PHENERGAN) 25 MG tablet Take 1 tablet (25 mg total) by mouth every 6 (six) hours as needed for nausea. 07/15/15  Yes [name], PA-C   Tapentadol HCl (NUCYNTA) 100 MG TABS Take 200 mg by mouth every 8 (eight) hours as needed (pain).    Yes Historical Provider, MD  tiZANidine (ZANAFLEX) 4 MG tablet Take 4 mg by mouth 4 (four) times daily.   Yes Historical Provider, MD  traZODone (DESYREL) 100 MG tablet Take 100-400 mg by mouth at bedtime. And if needed Can take up to 400mg  at bedtime for sleep   Yes Historical Provider, MD  clindamycin (CLEOCIN) 150 MG capsule Take 3 capsules (450 mg total) by mouth 3 (three) times daily. 08/16/15   Nihaal Friesen, PA-C  Dexlansoprazole 30 MG capsule Take 1 capsule (30 mg total) by mouth daily. 08/16/15   Shonice Wrisley, PA-C   BP 133/102 mmHg  Pulse 80  Temp(Src) 98.7 F (37.1 C) (Oral)  Resp 20  SpO2 97%  LMP 07/01/2015 Physical Exam  Constitutional: She is oriented to person, place, and time. She appears well-developed and well-nourished. No distress.  HENT:  Head: Normocephalic.  Eyes: Conjunctivae and EOM are normal.  Cardiovascular: Normal rate, regular rhythm and intact distal pulses.   Pulmonary/Chest: Effort normal and breath sounds normal. No stridor. No respiratory distress. She has no wheezes. She has no rales. She exhibits no tenderness.  Abdominal: Soft.  Musculoskeletal: Normal range of motion.  Neurological: She is alert and oriented to person, place, and time.  Skin:  Laceration to the thenar eminence of left hand actively draining purulent material, distally neurovascularly intact.  Psychiatric: She has a normal mood and affect.  Nursing note and vitals reviewed.     ED Course  .Marland Kitchen.Incision and Drainage Date/Time: 08/16/2015 4:24 PM Performed by: Wynetta EmeryPISCIOTTA, Trung Wenzl Authorized by: Wynetta EmeryPISCIOTTA, Grant Swager Consent: Verbal consent obtained. Risks and benefits: risks, benefits and alternatives were discussed Consent given by: patient Required items: required blood products, implants, devices, and special equipment available Type: abscess Anesthesia: local  infiltration Local anesthetic: lidocaine 1% without epinephrine Anesthetic total: 3 ml Patient sedated: no Scalpel size: 11 Incision type: single straight Incision depth: dermal Complexity: complex Drainage: purulent and  bloody Drainage amount: scant Wound treatment: wound left open Packing material: none Patient tolerance: Patient tolerated the procedure well with no immediate complications   (including critical care time) Labs Review Labs Reviewed - No data to display  Imaging Review No results found. I have personally reviewed and evaluated these images and lab results as part of my medical decision-making.   EKG Interpretation None      MDM   Final diagnoses:  Infected laceration   Filed Vitals:   08/16/15 1224 08/16/15 1446 08/16/15 1639  BP: 134/103 135/97 133/102  Pulse: 93 94 80  Temp: 98.1 F (36.7 C)  98.7 F (37.1 C)  TempSrc: Oral  Oral  Resp: SpO2: 99% 99% 97%    Medications  lidocaine-EPINEPHrine-tetracaine (LET) solution (3 mLs Topical Given 08/16/15 1338)  lidocaine (PF) (XYLOCAINE) 1 % injection 5 mL ( Intradermal Given by Other 08/16/15 1339)  clindamycin (CLEOCIN) capsule 450 mg (450 mg Oral Given 08/16/15 1353)  morphine 4 MG/ML injection 4 mg (4 mg Intramuscular Given 08/16/15 1418)  bupivacaine (MARCAINE) 0.5 % injection 50 mL (30 mLs Infiltration Given by Other 08/16/15 1419)  pantoprazole (PROTONIX) EC tablet 40 mg (40 mg Oral Given 08/16/15 1636)    Sandra [name] is 45 y.o. female presenting with Infected laceration to palm of nondominant hand. No systemic signs of infection, is actively draining purulent cereal. No systemic signs of infection, we had a very difficult time obtaining local analgesia so I and D could be performed. Attending physician performed ultrasound guided median nerve block. Wound is reopened  Evaluation does not show pathology that would require ongoing emergent intervention or inpatient treatment. Pt is  hemodynamically stable and mentating appropriately. Discussed findings and plan with patient/guardian, who agrees with care plan. All questions answered. Return precautions discussed and outpatient follow up given.   New Prescriptions   CLINDAMYCIN (CLEOCIN) 150 MG CAPSULE    Take 3 capsules (450 mg total) by mouth 3 (three) times daily.   DEXLANSOPRAZOLE 30 MG CAPSULE    Take 1 capsule (30 mg total) by mouth daily.        Wynetta Emery, PA-C 08/16/15 1645  Richardean Canal, MD 08/16/15 [phone]

## 2015-08-16 NOTE — Discharge Instructions (Signed)
If you see signs of worsening infection (warmth, redness, tenderness, pus, sharp increase in pain, fever, red streaking) immediately return to the emergency department.  Do not hesitate to return to the emergency room for any new, worsening or concerning symptoms.  Please obtain primary care using resource guide below. Let them know that you were seen in the emergency room and that they will need to obtain records for further outpatient management.    AllstateCommunity Resource Guide Financial Assistance The United Ways 211 is a great source of information about community services available.  Access by dialing 2-1-1 from anywhere in [place], or by website -  PooledIncome.plwww.nc211.org.   Other Local Resources (Updated 02/2015)  Financial Assistance   Services    Phone Number and Address  [facility]  Low-cost medical care - 1st and 3rd Saturday of every month  Must not qualify for public or private insurance and must have limited income [phone] [address] [place], [place]    [place] Department of Social Services  Child care  Emergency assistance for housing and Kimberly-Clarkutilities  Food stamps  Medicaid [phone] [address] [place], [place] [address]   [facility]  Low-cost medical care for children, communicable diseases, sexually-transmitted diseases, immunizations, maternity care, womens health and family planning [phone] [address] [place], [place] [address]  [facility]   Medication assistance for [facility] [place] residents  Must meet income requirements [phone] [address] [place], [place].    [facility]  Child care  Emergency assistance for housing and Kimberly-Clarkutilities  Food stamps  Medicaid [phone] [address] [place], [place] [address]  [facility]   Low-cost medical care,   Monday  through Friday, 9 am to 6 pm.   Accepts Medicare/Medicaid, and self-pay [phone] [address] [place], [place] [address]  [facility]  Low-cost medical care - Monday through Friday, 8:30 am - 5:30 pm  Accepts Medicaid and self-pay [phone] [address] [place], [place] [address]   [facility]  Primary medical care, including for those with sickle cell disease  Accepts Medicare, Medicaid, insurance and self-pay [phone] [address] [place], [place]  [facility]   Primary medical care  Accepts Medicare, [place], insurance and self-pay [phone] [address] [place], [place] [address]   [facility] Department of Social Services  Child care  Emergency assistance for housing and Kimberly-Clarkutilities  Food stamps  Medicaid [phone] [address] [place], [place] [address]  [facility] of Health and CarMaxHuman Services  Child care  Emergency assistance for housing and Kimberly-Clarkutilities  Food stamps  Medicaid [phone] [address] [place], [place] [address]   [facility] Medication Assistance Program  Medication assistance for [facility] [place] residents with no insurance only  Must have a primary care doctor [phone] [address], [address] [place], [place]  [facility]   Primary medical care  [place]s Medicare, [place]icaid, insurance  [phone] [address] [place], [place]  MedAssist   Medication assistance [phone]  [facility]   Primary medical care  Accepts Medicare, [place]icaid, insurance and self-pay [phone] [address] [place], [place] [address]  [facility]   Primary medical care  Accepts Medicare, [place]icaid, insurance and self-pay [phone] [address] [place], [place] [address]  [facility]  For [facility] [place] residents between the  ages of 1518 and 6764 who do not have any form of health insurance, Medicare, [place]icaid, or [place] benefits.  Services are provided free of charge to uninsured patients who fall within federal poverty guidelines.    Hours: Tuesdays and Thursdays, 4:15 - 8 pm [phone] [address], [address] [place], [place] [address]  [facility]     Primary medical care  Dental care  Nutritional counseling  Pharmacy  Accepts Medicaid, Medicare, most insurance.  Fees are adjusted based on ability to pay.   [phone] [facility] [address] [place], [place]  [phone] [facility] [address] [place], [place]  [phone] [facility] [place], [place]  [phone] [facility], [address] [place], [place]  [phone] [facility] [address] [place], [place]  [facility]  Womens health and family planning [phone] [address] [place], [place]  [facility] Department of Social Services  Child care  Emergency assistance for housing and Kimberly-Clarkutilities  Food stamps  Medicaid [phone] [address], [place], [place] [address]   [facility]    Ages 3218 and older  Hours: Mondays and Thursdays, 7:00 am - 9:00 am Patients are seen on a first come, first served basis. [phone] [address] [place], [place]  [facility] Division of Social Services  Child care  Emergency assistance for housing and Kimberly-Clarkutilities  Food stamps  Medicaid [phone] [address] [place], [place] [address]  [employer]  Medication assistance  Rental assistance  Food pantry  Medication assistance  Housing assistance  Emergency food distribution  Utility assistance [phone] [address] [place], [place]  [phone]  [address] [place], [place] [address] Hours:  Tuesdays and Thursdays from 9am - 12 noon by appointment only  [phone] [address] [place], [place] [address]  [facility] Lanae Boastlara F. Gunn   Accepts private insurance, [place] IslandMedicare, and [place]Medicaid.  Payment is based on a sliding scale for those without insurance.  Hours: Mondays, Tuesdays and Thursdays, 8:30 am - 5:30 pm.   [phone] [address] [place], [place]  [facility] - Family Medicine at [facility]    Accepts private insurance, [place] IslandMedicare, and [place]icaid.  Payment is based on a sliding scale for those without insurance. [phone] [address] [place], [place]  [facility] - Pediatrics at E. Scientist, research (physical sciences)[place]  Accepts private insurance, Harrah[employer] EntertainmentMedicare, and [place]icaid.  Payment is based on a sliding scale for those without insurance [phone] [address], [place], [place]  [facility] - Pediatrics at [place] [facility]  Accepts private insurance, Universal CityMedicare, and [place]icaid.  Payment is based on a sliding scale for those without insurance. [phone] [address] [place], [place]  [facility] - Pediatrics at [facility]  Accepts private insurance, [place] IslandMedicare, and [place]icaid.  Payment is based on a sliding scale for those without insurance. [phone], [phone] [place], [place].    [facility]  Maternity care.  Accepts Medicaid and self-pay. [phone] [address] [place], [place]

## 2015-08-16 NOTE — ED Notes (Signed)
Pt states she was supposed to get her stitches on left hand removed the beginning of the week, it is not red and has a pus like discharge. Pt states she would like a refill on her medication Dexalat. Pt also adds that her urine has been having a "vinegar" like smell to it since the beginning of the week.

## 2015-08-19 ENCOUNTER — Emergency Department (HOSPITAL_COMMUNITY)
Admission: EM | Admit: 2015-08-19 | Discharge: 2015-08-19 | Disposition: A | Discharge status: Home / Self Care | Payer: Medicaid Other | Attending: Emergency Medicine | Admitting: Emergency Medicine

## 2015-08-19 ENCOUNTER — Encounter (HOSPITAL_COMMUNITY): Payer: Self-pay | Admitting: Emergency Medicine

## 2015-08-19 DIAGNOSIS — Z4801 Encounter for change or removal of surgical wound dressing: Secondary | ICD-10-CM | POA: Diagnosis present

## 2015-08-19 DIAGNOSIS — I10 Essential (primary) hypertension: Secondary | ICD-10-CM | POA: Insufficient documentation

## 2015-08-19 DIAGNOSIS — X58XXXD Exposure to other specified factors, subsequent encounter: Secondary | ICD-10-CM | POA: Insufficient documentation

## 2015-08-19 DIAGNOSIS — Z79899 Other long term (current) drug therapy: Secondary | ICD-10-CM | POA: Diagnosis not present

## 2015-08-19 DIAGNOSIS — Y939 Activity, unspecified: Secondary | ICD-10-CM | POA: Insufficient documentation

## 2015-08-19 DIAGNOSIS — F329 Major depressive disorder, single episode, unspecified: Secondary | ICD-10-CM | POA: Diagnosis not present

## 2015-08-19 DIAGNOSIS — Y999 Unspecified external cause status: Secondary | ICD-10-CM | POA: Diagnosis not present

## 2015-08-19 DIAGNOSIS — S61412D Laceration without foreign body of left hand, subsequent encounter: Secondary | ICD-10-CM | POA: Diagnosis not present

## 2015-08-19 DIAGNOSIS — E669 Obesity, unspecified: Secondary | ICD-10-CM | POA: Diagnosis not present

## 2015-08-19 DIAGNOSIS — S61412A Laceration without foreign body of left hand, initial encounter: Secondary | ICD-10-CM

## 2015-08-19 DIAGNOSIS — I4891 Unspecified atrial fibrillation: Secondary | ICD-10-CM | POA: Insufficient documentation

## 2015-08-19 DIAGNOSIS — Y929 Unspecified place or not applicable: Secondary | ICD-10-CM | POA: Diagnosis not present

## 2015-08-19 DIAGNOSIS — Z7982 Long term (current) use of aspirin: Secondary | ICD-10-CM | POA: Insufficient documentation

## 2015-08-19 LAB — CBG MONITORING, ED: GLUCOSE-CAPILLARY: 117 mg/dL — AB (ref 65–99)

## 2015-08-19 NOTE — ED Notes (Signed)
Pt returned to have wound on l/hand re-evaluated 2 cm suture line resolving. No new drainage reported. Area around laceration site remains red and tender to touch

## 2015-08-19 NOTE — ED Notes (Signed)
PT DISCHARGED. INSTRUCTIONS GIVEN. AAOX4. PT IN NO APPARENT DISTRESS. THE OPPORTUNITY TO ASK QUESTIONS WAS PROVIDED. 

## 2015-08-19 NOTE — ED Provider Notes (Signed)
CSN: [id number]     Arrival date & time 08/19/15  1416 History   First MD Initiated Contact with Patient 08/19/15 1431     Chief Complaint  Patient presents with  . Wound Check    l/hand sutures removed 3 days ago     (Consider location/radiation/quality/duration/timing/severity/associated sxs/prior Treatment) Patient is a 45 y.o. female presenting with wound check. The history is provided by the patient. No language interpreter was used.  Wound Check This is a new problem. The current episode started today. The problem occurs constantly. The problem has been unchanged. Pertinent negatives include no vomiting. Nothing aggravates the symptoms. She has tried nothing for the symptoms. The treatment provided moderate relief.  Pt is here for laceration recheck.  Pt had sutures for laceration and wound became infected and drained.  Pt is here for wound check.  Pt reports no further drainage. Open area has closed.    Past Medical History  Diagnosis Date  . GERD (gastroesophageal reflux disease)   . Gastroparesis   . Hypertension   . Restless legs   . H/O echocardiogram     a. 02/2010: done for htn/tachy while pregnant - EF 55-60%, grade 1 d/dysf, mildly dilated LA.  . High cholesterol   . Atrial fibrillation with RVR (HCC)     a. Dx 09/2012, spont conv to NSR.  Marland Kitchen. Sleep apnea   . Gestational diabetes   . Migraines   . Chronic lower back pain   . Anxiety   . Kidney stones   . Anemia     a. Mild with Hgb 11.2, 09/2012. UA with mod Hgb. Instructed to f/u PCP.  Marland Kitchen. Obesity   . Anxiety   . Depression   . Panic attack    Past Surgical History  Procedure Laterality Date  . Tubal ligation  2012  . Cesarean section  2012  . Dilation and curettage of uterus  2006; 2010  . Cystoscopy/retrograde/ureteroscopy/stone extraction with basket  1987  . Cystoscopy w/ ureteral stent placement  2006  . Back surgery     Family History  Problem Relation Age of Onset  . Diabetes Mother   . Hypertension  Mother   . Migraines Father   . Migraines Brother    Social History  Substance Use Topics  . Smoking status: Never Smoker   . Smokeless tobacco: Never Used  . Alcohol Use: No   OB History    No data available     Review of Systems  Gastrointestinal: Negative for vomiting.  All other systems reviewed and are negative.     Allergies  Iodides; Imitrex; and Metoclopramide hcl  Home Medications   Prior to Admission medications   Medication Sig Start Date End Date Taking? Authorizing Provider  aspirin-acetaminophen-caffeine (EXCEDRIN MIGRAINE) [phone] MG tablet Take 2 tablets by mouth every 6 (six) hours as needed for headache.    Historical Provider, MD  atorvastatin (LIPITOR) 40 MG tablet Take 40 mg by mouth daily. Reported on 08/04/2015    Historical Provider, MD  buprenorphine (BUTRANS) 20 MCG/HR PTWK patch Place 20 mcg onto the skin once a week. When needed for pain or addiction to opiods    Historical Provider, MD  busPIRone (BUSPAR) 10 MG tablet Take 10 mg by mouth 2 (two) times daily.    Historical Provider, MD  citalopram (CELEXA) 20 MG tablet Take 20 mg by mouth daily.    Historical Provider, MD  clindamycin (CLEOCIN) 150 MG capsule Take 3 capsules (450 mg total)  by mouth 3 (three) times daily. 08/16/15   Nicole Pisciotta, PA-C  Dexlansoprazole 30 MG capsule Take 1 capsule (30 mg total) by mouth daily. 08/16/15   Nicole Pisciotta, PA-C  hydrOXYzine (ATARAX/VISTARIL) 25 MG tablet Take 1 tablet (25 mg total) by mouth every 6 (six) hours as needed for anxiety. Patient taking differently: Take 25-50 mg by mouth every 6 (six) hours as needed for anxiety.  01/01/15   [name], PA-C  loratadine (CLARITIN) 10 MG tablet Take 10 mg by mouth daily as needed for allergies.     Historical Provider, MD  mirtazapine (REMERON) 15 MG tablet Take 15 mg by mouth at bedtime.    Historical Provider, MD  pregabalin (LYRICA) 300 MG capsule Take 300 mg by mouth 3 (three) times daily as needed  (pain).     Historical Provider, MD  promethazine (PHENERGAN) 25 MG tablet Take 1 tablet (25 mg total) by mouth every 6 (six) hours as needed for nausea. 07/15/15   Chase PicketJaime Pilcher Ward, PA-C  Tapentadol HCl (NUCYNTA) 100 MG TABS Take 200 mg by mouth every 8 (eight) hours as needed (pain).     Historical Provider, MD  tiZANidine (ZANAFLEX) 4 MG tablet Take 4 mg by mouth 4 (four) times daily.    Historical Provider, MD  traZODone (DESYREL) 100 MG tablet Take 100-400 mg by mouth at bedtime. And if needed Can take up to 400mg  at bedtime for sleep    Historical Provider, MD   BP 136/92 mmHg  Pulse 72  Temp(Src) 98.5 F (36.9 C) (Oral)  Resp 18  SpO2 99%  LMP 07/20/2015 (Approximate) Physical Exam  Constitutional: She is oriented to person, place, and time. She appears well-developed and well-nourished.  Eyes: EOM are normal.  Neck: Normal range of motion.  Pulmonary/Chest: Effort normal.  Abdominal: She exhibits no distension.  Musculoskeletal: She exhibits tenderness.  Healing laceration,  No sign of infection    Neurological: She is alert and oriented to person, place, and time.  Skin: Skin is warm.  Psychiatric: She has a normal mood and affect.  Nursing note and vitals reviewed.   ED Course  Procedures (including critical care time) Labs Review Labs Reviewed  CBG MONITORING, ED - Abnormal; Notable for the following:    Glucose-Capillary 117 (*)    All other components within normal limits    Imaging Review No results found. I have personally reviewed and evaluated these images and lab results as part of my medical decision-making.   EKG Interpretation None      MDM Pt was gestation diabetic.  I checked glucose today.  Glucose is 117.   Final diagnoses:  Laceration of hand, left, initial encounter    Pt advised to continue antibiotics. Wound care    Elson AreasLeslie K Sofia, PA-C 08/19/15 1546  Lorre NickAnthony Allen, MD 08/19/15 1710

## 2015-08-19 NOTE — Discharge Instructions (Signed)

## 2016-03-09 ENCOUNTER — Emergency Department (HOSPITAL_COMMUNITY)
Admission: EM | Admit: 2016-03-09 | Discharge: 2016-03-10 | Disposition: A | Discharge status: Home / Self Care | Payer: Medicaid Other | Attending: Emergency Medicine | Admitting: Emergency Medicine

## 2016-03-09 ENCOUNTER — Encounter (HOSPITAL_COMMUNITY): Payer: Self-pay | Admitting: Emergency Medicine

## 2016-03-09 ENCOUNTER — Emergency Department (HOSPITAL_COMMUNITY): Payer: Medicaid Other

## 2016-03-09 DIAGNOSIS — Z79899 Other long term (current) drug therapy: Secondary | ICD-10-CM | POA: Insufficient documentation

## 2016-03-09 DIAGNOSIS — N3000 Acute cystitis without hematuria: Secondary | ICD-10-CM | POA: Diagnosis not present

## 2016-03-09 DIAGNOSIS — R112 Nausea with vomiting, unspecified: Secondary | ICD-10-CM

## 2016-03-09 DIAGNOSIS — I10 Essential (primary) hypertension: Secondary | ICD-10-CM | POA: Diagnosis not present

## 2016-03-09 DIAGNOSIS — E876 Hypokalemia: Secondary | ICD-10-CM | POA: Insufficient documentation

## 2016-03-09 LAB — COMPREHENSIVE METABOLIC PANEL
ALBUMIN: 4.6 g/dL (ref 3.5–5.0)
ALT: 34 U/L (ref 14–54)
AST: 18 U/L (ref 15–41)
Alkaline Phosphatase: 43 U/L (ref 38–126)
Anion gap: 8 (ref 5–15)
BUN: 14 mg/dL (ref 6–20)
CHLORIDE: 107 mmol/L (ref 101–111)
CO2: 24 mmol/L (ref 22–32)
Calcium: 9.5 mg/dL (ref 8.9–10.3)
Creatinine, Ser: 0.94 mg/dL (ref 0.44–1.00)
GFR calc Af Amer: >60 mL/min (ref >60)
GFR calc non Af Amer: >60 mL/min (ref >60)
GLUCOSE: 183 mg/dL — AB (ref 65–99)
POTASSIUM: 3 mmol/L — AB (ref 3.5–5.1)
Sodium: 139 mmol/L (ref 135–145)
Total Bilirubin: 0.7 mg/dL (ref 0.3–1.2)
Total Protein: 7.6 g/dL (ref 6.5–8.1)

## 2016-03-09 LAB — LIPASE, BLOOD: LIPASE: 36 U/L (ref 11–51)

## 2016-03-09 LAB — CBC
HEMATOCRIT: 42.3 % (ref 36.0–46.0)
Hemoglobin: 14 g/dL (ref 12.0–15.0)
MCH: 27.1 pg (ref 26.0–34.0)
MCHC: 33.1 g/dL (ref 30.0–36.0)
MCV: 81.8 fL (ref 78.0–100.0)
Platelets: 219 10*3/uL (ref 150–400)
RBC: 5.17 MIL/uL — ABNORMAL HIGH (ref 3.87–5.11)
RDW: 14.5 % (ref 11.5–15.5)
WBC: 11.4 10*3/uL — ABNORMAL HIGH (ref 4.0–10.5)

## 2016-03-09 LAB — URINALYSIS, ROUTINE W REFLEX MICROSCOPIC
Bilirubin Urine: NEGATIVE
GLUCOSE, UA: NEGATIVE mg/dL
KETONES UR: 5 mg/dL — AB
Nitrite: POSITIVE — AB
PROTEIN: 100 mg/dL — AB
Specific Gravity, Urine: 1.017 (ref 1.005–1.030)
pH: 5 (ref 5.0–8.0)

## 2016-03-09 LAB — RAPID URINE DRUG SCREEN, HOSP PERFORMED
Amphetamines: NOT DETECTED
BENZODIAZEPINES: POSITIVE — AB
Barbiturates: NOT DETECTED
COCAINE: NOT DETECTED
Opiates: POSITIVE — AB
Tetrahydrocannabinol: NOT DETECTED

## 2016-03-09 MED ORDER — SODIUM CHLORIDE 0.9 % IV SOLN
Freq: Once | INTRAVENOUS | Status: AC
  Administered 2016-03-09: 20:00:00 via INTRAVENOUS

## 2016-03-09 MED ORDER — ONDANSETRON HCL 4 MG/2ML IJ SOLN
4.0000 mg | Freq: Once | INTRAMUSCULAR | Status: AC
  Administered 2016-03-09: 4 mg via INTRAVENOUS
  Filled 2016-03-09: qty 2

## 2016-03-09 MED ORDER — SODIUM CHLORIDE 0.9 % IV BOLUS (SEPSIS)
1000.0000 mL | Freq: Once | INTRAVENOUS | Status: AC
  Administered 2016-03-09: 1000 mL via INTRAVENOUS

## 2016-03-09 MED ORDER — DEXTROSE 5 % IV SOLN
1.0000 g | Freq: Once | INTRAVENOUS | Status: AC
  Administered 2016-03-10: 1 g via INTRAVENOUS
  Filled 2016-03-09: qty 10

## 2016-03-09 MED ORDER — PROMETHAZINE HCL 25 MG/ML IJ SOLN
25.0000 mg | Freq: Once | INTRAMUSCULAR | Status: AC
  Administered 2016-03-09: 25 mg via INTRAMUSCULAR
  Filled 2016-03-09: qty 1

## 2016-03-09 NOTE — ED Provider Notes (Signed)
Patient signed out at end of shift by Langston MaskerKaren Sofia, PA-C. Patient presented to ED with complaint of diarrhea, vomiting, with h/o gastroparesis, per patient. She states no problems with this is over 4 years. She denies urinary symptoms, cough, fever. The patient is wearing a diaper because she states she is passing stool every few minutes.   IVF's provided, Phenergan with relief of nausea. A single dose morphine was provided. She is drinking ginger ale without further vomiting. She is found to have a UTI and rocephin has been provided in the ED. Will send home on Keflex. Discussed discharge with the patient and she is comfortable going home.    Elpidio AnisShari Tamarra Geiselman, PA-C 03/10/16 0110    Shaune Pollackameron Isaacs, MD 03/11/16 1155

## 2016-03-09 NOTE — ED Notes (Signed)
Main lab contacted to draw patient's labs.

## 2016-03-09 NOTE — ED Notes (Signed)
Patient return from X-ray 

## 2016-03-09 NOTE — ED Notes (Addendum)
Offered patient zofran. Patient states "You have to give me phenergan and some morphine or something with it. I can't have zofran." EDP made aware of patient's request. Patient states she cannot urinate at this time.

## 2016-03-09 NOTE — ED Notes (Signed)
Pt reports she spun around from bed and slid to floor causing right elbow pain provider notified.

## 2016-03-09 NOTE — ED Notes (Signed)
Patient verbally yelled out, this nurse entered and saw patient lying in floor on her rt side by the bed. Pt stated she slid to the floor after attempting to stand. Denies hitting her head, states she feels week but is not having any pain in extremities. Strength equal bilaterally. Patient assisted slowly back into bed by Clinical research associatewriter, another Psychiatric nurseN and nurse tech.

## 2016-03-09 NOTE — ED Provider Notes (Signed)
[facility]-EMERGENCY DEPT Provider Note   CSN: [id number] Arrival date & time: 03/09/16  1709     History   Chief Complaint Chief Complaint  Patient presents with  . NVD    HPI Sandra [name] is a 46 y.o. female.  The history is provided by the patient. No language interpreter was used.  Emesis   This is a recurrent problem. The current episode started 12 to 24 hours ago. The problem has been gradually worsening. The emesis has an appearance of stomach contents. There has been no fever. Associated symptoms include diarrhea. Pertinent negatives include no fever. Risk factors include ill contacts.  Pt complains of vomiting.  Pt reports she has gastroparesis.  Pt requesting morphine and phenergan.  Pt reports she has restless leg syndrome and take nyucenta.  Pt has not been able to take her medications.  Past Medical History:  Diagnosis Date  . Anemia    a. Mild with Hgb 11.2, 09/2012. UA with mod Hgb. Instructed to f/u PCP.  Marland Kitchen Anxiety   . Anxiety   . Atrial fibrillation with RVR (HCC)    a. Dx 09/2012, spont conv to NSR.  Marland Kitchen Chronic lower back pain   . Depression   . Gastroparesis   . GERD (gastroesophageal reflux disease)   . Gestational diabetes   . H/O echocardiogram    a. 02/2010: done for htn/tachy while pregnant - EF 55-60%, grade 1 d/dysf, mildly dilated LA.  . High cholesterol   . Hypertension   . Kidney stones   . Migraines   . Obesity   . Panic attack   . Restless legs   . Sleep apnea     Patient Active Problem List   Diagnosis Date Noted  . Severe recurrent major depression without psychotic features (HCC) 11/15/2014    Class: Chronic  . Major depressive disorder, single episode, severe without psychosis (HCC) 11/15/2014  . Altered mental status 01/15/2014  . Acute encephalopathy 02/13/2013  . Acute respiratory failure (HCC) 02/13/2013  . Aspiration pneumonia (HCC) 02/13/2013  . Lumbar spondylosis 04/21/2011  . Trochanteric bursitis 04/21/2011  . Myofascial  pain 04/21/2011  . TACHYCARDIA 03/06/2010  . DYSPNEA 01/06/2010  . RESTLESS LEG SYNDROME 12/11/2009  . MIGRAINE HEADACHE 12/11/2009  . HYPERTENSION 12/11/2009  . GERD 12/11/2009  . GASTROPARESIS 12/11/2009  . DIABETES MELLITUS, GESTATIONAL, HX OF 12/11/2009    Past Surgical History:  Procedure Laterality Date  . BACK SURGERY    . CESAREAN SECTION  2012  . CYSTOSCOPY W/ URETERAL STENT PLACEMENT  2006  . CYSTOSCOPY/RETROGRADE/URETEROSCOPY/STONE EXTRACTION WITH BASKET  1987  . DILATION AND CURETTAGE OF UTERUS  2006; 2010  . TUBAL LIGATION  2012    OB History    No data available       Home Medications    Prior to Admission medications   Medication Sig Start Date End Date Taking? Authorizing Provider  busPIRone (BUSPAR) 10 MG tablet Take 10 mg by mouth 3 (three) times daily.    Yes Historical Provider, MD  citalopram (CELEXA) 20 MG tablet Take 20 mg by mouth daily.   Yes Historical Provider, MD  Dexlansoprazole 30 MG capsule Take 1 capsule (30 mg total) by mouth daily. Patient taking differently: Take 30 mg by mouth 2 (two) times daily.  08/16/15  Yes Nicole Pisciotta, PA-C  mirtazapine (REMERON) 15 MG tablet Take 15 mg by mouth at bedtime.   Yes Historical Provider, MD  pregabalin (LYRICA) 300 MG capsule Take 300 mg by mouth  3 (three) times daily as needed (pain).    Yes Historical Provider, MD  promethazine (PHENERGAN) 25 MG tablet Take 1 tablet (25 mg total) by mouth every 6 (six) hours as needed for nausea. 07/15/15  Yes [name], PA-C  Tapentadol HCl (NUCYNTA) 100 MG TABS Take 100 mg by mouth 4 (four) times daily as needed (pain).    Yes Historical Provider, MD  tiZANidine (ZANAFLEX) 4 MG tablet Take 4 mg by mouth 4 (four) times daily.   Yes Historical Provider, MD  traZODone (DESYREL) 100 MG tablet Take 100-400 mg by mouth at bedtime. And if needed Can take up to 400mg  at bedtime for sleep   Yes Historical Provider, MD  zolpidem (AMBIEN) 10 MG tablet Take 10 mg by  mouth at bedtime.   Yes Historical Provider, MD  aspirin-acetaminophen-caffeine (EXCEDRIN MIGRAINE) [phone] MG tablet Take 2 tablets by mouth every 6 (six) hours as needed for headache.    Historical Provider, MD  atorvastatin (LIPITOR) 40 MG tablet Take 40 mg by mouth daily. Reported on 08/04/2015    Historical Provider, MD  buprenorphine (BUTRANS) 20 MCG/HR PTWK patch Place 20 mcg onto the skin once a week. When needed for pain or addiction to opiods    Historical Provider, MD  clindamycin (CLEOCIN) 150 MG capsule Take 3 capsules (450 mg total) by mouth 3 (three) times daily. Patient not taking: Reported on 03/09/2016 08/16/15   Joni Reining Pisciotta, PA-C  hydrOXYzine (ATARAX/VISTARIL) 25 MG tablet Take 1 tablet (25 mg total) by mouth every 6 (six) hours as needed for anxiety. Patient not taking: Reported on 03/09/2016 01/01/15   Danelle Berry, PA-C    Family History Family History  Problem Relation Age of Onset  . Diabetes Mother   . Hypertension Mother   . Migraines Father   . Migraines Brother     Social History Social History  Substance Use Topics  . Smoking status: Never Smoker  . Smokeless tobacco: Never Used  . Alcohol use No     Allergies   Iodides; Imitrex [sumatriptan]; Toradol [ketorolac tromethamine]; and Metoclopramide hcl   Review of Systems Review of Systems  Constitutional: Negative for fever.  Gastrointestinal: Positive for diarrhea and vomiting.  All other systems reviewed and are negative.    Physical Exam Updated Vital Signs BP (!) 149/105 (BP Location: Right Arm)   Pulse 89   Temp 98.3 F (36.8 C) (Oral)   Resp 18   SpO2 100%   Physical Exam  Constitutional: She appears well-developed and well-nourished. No distress.  HENT:  Head: Normocephalic and atraumatic.  Eyes: Conjunctivae are normal.  Neck: Neck supple.  Cardiovascular: Normal rate and regular rhythm.   No murmur heard. Pulmonary/Chest: Effort normal and breath sounds normal. No  respiratory distress.  Abdominal: Soft. There is no tenderness.  Musculoskeletal: She exhibits no edema.  Neurological: She is alert.  Skin: Skin is warm and dry.  Psychiatric: She has a normal mood and affect.  Nursing note and vitals reviewed.    ED Treatments / Results  Labs (all labs ordered are listed, but only abnormal results are displayed) Labs Reviewed  COMPREHENSIVE METABOLIC PANEL - Abnormal; Notable for the following:       Result Value   Potassium 3.0 (*)    Glucose, Bld 183 (*)    All other components within normal limits  CBC - Abnormal; Notable for the following:    WBC 11.4 (*)    RBC 5.17 (*)    All other  components within normal limits  LIPASE, BLOOD  URINALYSIS, ROUTINE W REFLEX MICROSCOPIC    EKG  EKG Interpretation None       Radiology No results found.  Procedures Procedures (including critical care time)  Medications Ordered in ED Medications  promethazine (PHENERGAN) injection 25 mg (25 mg Intramuscular Given 03/09/16 2008)  0.9 %  sodium chloride infusion ( Intravenous New Bag/Given 03/09/16 2009)     Initial Impression / Assessment and Plan / ED Course  I have reviewed the triage vital signs and the nursing notes.  Pertinent labs & imaging results that were available during my care of the patient were reviewed by me and considered in my medical decision making (see chart for details).     Pt reports she slid off bed and hit her right elbow.  Pt has superficial abrasion.  Xray elbow pending.  Pt given Iv fluids and phenergan IM.  Pt has history of overdose/substance abuse on old records.    Final Clinical Impressions(s) / ED Diagnoses   Final diagnoses:  Nausea and vomiting, intractability of vomiting not specified, unspecified vomiting type    New Prescriptions New Prescriptions   No medications on file     Elson AreasLeslie K Sofia, Cordelia Poche-C 03/09/16 2033    Shaune Pollackameron Isaacs, MD 03/11/16 1153

## 2016-03-09 NOTE — ED Notes (Signed)
Unable to collect labs because I did not see or feel anything to collect labs from

## 2016-03-09 NOTE — ED Triage Notes (Signed)
Pt reports NVD  X 1 day. alet and oriented x 4.

## 2016-03-09 NOTE — ED Notes (Signed)
ED Provider at bedside. 

## 2016-03-09 NOTE — ED Notes (Signed)
Pt was offered Zofran for emesis, sts was allergic to it .

## 2016-03-10 MED ORDER — MORPHINE SULFATE (PF) 4 MG/ML IV SOLN
6.0000 mg | Freq: Once | INTRAVENOUS | Status: AC
  Administered 2016-03-10: 6 mg via INTRAVENOUS
  Filled 2016-03-10: qty 2

## 2016-03-10 MED ORDER — PROMETHAZINE HCL 25 MG PO TABS: 25.0000 mg | ORAL_TABLET | Freq: Four times a day (QID) | ORAL | 0 refills | Status: DC | PRN

## 2016-03-10 MED ORDER — CEPHALEXIN 500 MG PO CAPS: 500.0000 mg | ORAL_CAPSULE | Freq: Four times a day (QID) | ORAL | 0 refills | Status: DC

## 2016-03-10 MED ORDER — POTASSIUM CHLORIDE CRYS ER 20 MEQ PO TBCR: 20.0000 meq | EXTENDED_RELEASE_TABLET | Freq: Two times a day (BID) | ORAL | 0 refills | Status: DC

## 2016-03-10 MED ORDER — PROMETHAZINE HCL 25 MG/ML IJ SOLN
12.5000 mg | Freq: Once | INTRAMUSCULAR | Status: AC
  Administered 2016-03-10: 12.5 mg via INTRAVENOUS
  Filled 2016-03-10: qty 1

## 2016-03-13 ENCOUNTER — Emergency Department (HOSPITAL_COMMUNITY): Payer: Medicaid Other

## 2016-03-13 ENCOUNTER — Inpatient Hospital Stay (HOSPITAL_COMMUNITY)
Admission: EM | Admit: 2016-03-13 | Discharge: 2016-03-16 | DRG: 872 | Disposition: A | Discharge status: Home / Self Care | Payer: Medicaid Other | Attending: Family Medicine | Admitting: Family Medicine

## 2016-03-13 ENCOUNTER — Encounter (HOSPITAL_COMMUNITY): Payer: Self-pay

## 2016-03-13 DIAGNOSIS — K219 Gastro-esophageal reflux disease without esophagitis: Secondary | ICD-10-CM | POA: Diagnosis present

## 2016-03-13 DIAGNOSIS — G4733 Obstructive sleep apnea (adult) (pediatric): Secondary | ICD-10-CM | POA: Diagnosis present

## 2016-03-13 DIAGNOSIS — N179 Acute kidney failure, unspecified: Secondary | ICD-10-CM | POA: Diagnosis present

## 2016-03-13 DIAGNOSIS — E876 Hypokalemia: Secondary | ICD-10-CM | POA: Diagnosis not present

## 2016-03-13 DIAGNOSIS — Z833 Family history of diabetes mellitus: Secondary | ICD-10-CM

## 2016-03-13 DIAGNOSIS — Z6831 Body mass index (BMI) 31.0-31.9, adult: Secondary | ICD-10-CM

## 2016-03-13 DIAGNOSIS — N12 Tubulo-interstitial nephritis, not specified as acute or chronic: Secondary | ICD-10-CM | POA: Diagnosis not present

## 2016-03-13 DIAGNOSIS — G2581 Restless legs syndrome: Secondary | ICD-10-CM | POA: Diagnosis present

## 2016-03-13 DIAGNOSIS — F32A Depression, unspecified: Secondary | ICD-10-CM | POA: Diagnosis present

## 2016-03-13 DIAGNOSIS — I10 Essential (primary) hypertension: Secondary | ICD-10-CM | POA: Diagnosis not present

## 2016-03-13 DIAGNOSIS — Z8249 Family history of ischemic heart disease and other diseases of the circulatory system: Secondary | ICD-10-CM

## 2016-03-13 DIAGNOSIS — G43909 Migraine, unspecified, not intractable, without status migrainosus: Secondary | ICD-10-CM | POA: Diagnosis present

## 2016-03-13 DIAGNOSIS — Z23 Encounter for immunization: Secondary | ICD-10-CM

## 2016-03-13 DIAGNOSIS — E860 Dehydration: Secondary | ICD-10-CM | POA: Diagnosis present

## 2016-03-13 DIAGNOSIS — K3184 Gastroparesis: Secondary | ICD-10-CM | POA: Diagnosis present

## 2016-03-13 DIAGNOSIS — G8929 Other chronic pain: Secondary | ICD-10-CM | POA: Diagnosis present

## 2016-03-13 DIAGNOSIS — E669 Obesity, unspecified: Secondary | ICD-10-CM | POA: Diagnosis present

## 2016-03-13 DIAGNOSIS — R112 Nausea with vomiting, unspecified: Secondary | ICD-10-CM

## 2016-03-13 DIAGNOSIS — Z79899 Other long term (current) drug therapy: Secondary | ICD-10-CM

## 2016-03-13 DIAGNOSIS — Z888 Allergy status to other drugs, medicaments and biological substances status: Secondary | ICD-10-CM

## 2016-03-13 DIAGNOSIS — A419 Sepsis, unspecified organism: Principal | ICD-10-CM | POA: Diagnosis present

## 2016-03-13 DIAGNOSIS — F329 Major depressive disorder, single episode, unspecified: Secondary | ICD-10-CM | POA: Diagnosis present

## 2016-03-13 DIAGNOSIS — F410 Panic disorder [episodic paroxysmal anxiety] without agoraphobia: Secondary | ICD-10-CM | POA: Diagnosis present

## 2016-03-13 LAB — INFLUENZA PANEL BY PCR (TYPE A & B)
Influenza A By PCR: NEGATIVE
Influenza B By PCR: NEGATIVE

## 2016-03-13 LAB — COMPREHENSIVE METABOLIC PANEL
ALBUMIN: 4.5 g/dL (ref 3.5–5.0)
ALT: 136 U/L — ABNORMAL HIGH (ref 14–54)
ANION GAP: 13 (ref 5–15)
AST: 51 U/L — ABNORMAL HIGH (ref 15–41)
Alkaline Phosphatase: 47 U/L (ref 38–126)
BUN: 19 mg/dL (ref 6–20)
CALCIUM: 10 mg/dL (ref 8.9–10.3)
CHLORIDE: 102 mmol/L (ref 101–111)
CO2: 23 mmol/L (ref 22–32)
Creatinine, Ser: 1.18 mg/dL — ABNORMAL HIGH (ref 0.44–1.00)
GFR calc non Af Amer: 55 mL/min — ABNORMAL LOW (ref >60)
Glucose, Bld: 123 mg/dL — ABNORMAL HIGH (ref 65–99)
POTASSIUM: 3 mmol/L — AB (ref 3.5–5.1)
SODIUM: 138 mmol/L (ref 135–145)
Total Bilirubin: 1 mg/dL (ref 0.3–1.2)
Total Protein: 8.2 g/dL — ABNORMAL HIGH (ref 6.5–8.1)

## 2016-03-13 LAB — CBC
HEMATOCRIT: 42.3 % (ref 36.0–46.0)
HEMOGLOBIN: 14.2 g/dL (ref 12.0–15.0)
MCH: 27.4 pg (ref 26.0–34.0)
MCHC: 33.6 g/dL (ref 30.0–36.0)
MCV: 81.5 fL (ref 78.0–100.0)
Platelets: 257 10*3/uL (ref 150–400)
RBC: 5.19 MIL/uL — AB (ref 3.87–5.11)
RDW: 14.2 % (ref 11.5–15.5)
WBC: 8.9 10*3/uL (ref 4.0–10.5)

## 2016-03-13 LAB — URINALYSIS, ROUTINE W REFLEX MICROSCOPIC
GLUCOSE, UA: NEGATIVE mg/dL
HGB URINE DIPSTICK: NEGATIVE
KETONES UR: 5 mg/dL — AB
Nitrite: NEGATIVE
PROTEIN: 100 mg/dL — AB
Specific Gravity, Urine: 1.027 (ref 1.005–1.030)
pH: 5 (ref 5.0–8.0)

## 2016-03-13 LAB — DIFFERENTIAL
Basophils Absolute: 0 10*3/uL (ref 0.0–0.1)
Basophils Relative: 0 %
EOS ABS: 0 10*3/uL (ref 0.0–0.7)
EOS PCT: 0 %
Lymphocytes Relative: 21 %
Lymphs Abs: 1.9 10*3/uL (ref 0.7–4.0)
MONOS PCT: 8 %
Monocytes Absolute: 0.7 10*3/uL (ref 0.1–1.0)
NEUTROS PCT: 71 %
Neutro Abs: 6.4 10*3/uL (ref 1.7–7.7)

## 2016-03-13 LAB — LIPASE, BLOOD: LIPASE: 37 U/L (ref 11–51)

## 2016-03-13 LAB — I-STAT BETA HCG BLOOD, ED (MC, WL, AP ONLY): I-stat hCG, quantitative: <5 m[IU]/mL (ref <5)

## 2016-03-13 LAB — I-STAT CG4 LACTIC ACID, ED: Lactic Acid, Venous: 1.06 mmol/L (ref 0.5–1.9)

## 2016-03-13 LAB — POC URINE PREG, ED: PREG TEST UR: NEGATIVE

## 2016-03-13 MED ORDER — PROMETHAZINE HCL 25 MG/ML IJ SOLN
12.5000 mg | Freq: Once | INTRAMUSCULAR | Status: AC
  Administered 2016-03-13: 12.5 mg via INTRAVENOUS
  Filled 2016-03-13: qty 1

## 2016-03-13 MED ORDER — HYDROMORPHONE HCL 2 MG/ML IJ SOLN
1.0000 mg | Freq: Once | INTRAMUSCULAR | Status: AC
  Administered 2016-03-13: 1 mg via INTRAVENOUS
  Filled 2016-03-13: qty 1

## 2016-03-13 MED ORDER — SODIUM CHLORIDE 0.9 % IV BOLUS (SEPSIS)
500.0000 mL | Freq: Once | INTRAVENOUS | Status: AC
  Administered 2016-03-13: 500 mL via INTRAVENOUS

## 2016-03-13 MED ORDER — SODIUM CHLORIDE 0.9 % IV BOLUS (SEPSIS)
1000.0000 mL | Freq: Once | INTRAVENOUS | Status: AC
  Administered 2016-03-13: 1000 mL via INTRAVENOUS

## 2016-03-13 MED ORDER — LORAZEPAM 2 MG/ML IJ SOLN
0.5000 mg | Freq: Once | INTRAMUSCULAR | Status: AC
  Administered 2016-03-13: 0.5 mg via INTRAVENOUS
  Filled 2016-03-13: qty 1

## 2016-03-13 MED ORDER — HYDROMORPHONE HCL 1 MG/ML IJ SOLN
1.0000 mg | Freq: Once | INTRAMUSCULAR | Status: AC
  Administered 2016-03-13: 1 mg via INTRAVENOUS
  Filled 2016-03-13: qty 1

## 2016-03-13 MED ORDER — ACETAMINOPHEN 650 MG RE SUPP
650.0000 mg | Freq: Once | RECTAL | Status: AC
  Administered 2016-03-13: 650 mg via RECTAL
  Filled 2016-03-13: qty 1

## 2016-03-13 MED ORDER — DEXTROSE 5 % IV SOLN
2.0000 g | Freq: Once | INTRAVENOUS | Status: AC
  Administered 2016-03-13: 2 g via INTRAVENOUS
  Filled 2016-03-13: qty 2

## 2016-03-13 MED ORDER — DEXTROSE 5 % IV SOLN
1.0000 g | INTRAVENOUS | Status: DC
  Filled 2016-03-13: qty 10

## 2016-03-13 MED ORDER — HYDROMORPHONE HCL 1 MG/ML IJ SOLN: 1.0000 mg | Freq: Once | INTRAMUSCULAR | Status: DC

## 2016-03-13 MED ORDER — MORPHINE SULFATE (PF) 4 MG/ML IV SOLN
6.0000 mg | Freq: Once | INTRAVENOUS | Status: AC
  Administered 2016-03-13: 6 mg via INTRAVENOUS
  Filled 2016-03-13: qty 2

## 2016-03-13 MED ORDER — ONDANSETRON HCL 4 MG/2ML IJ SOLN
4.0000 mg | Freq: Once | INTRAMUSCULAR | Status: AC
  Administered 2016-03-13: 4 mg via INTRAVENOUS
  Filled 2016-03-13: qty 2

## 2016-03-13 NOTE — ED Notes (Signed)
No respiratory or acute distress noted alert and oriented x 3 call light in reach no reaction to medication noted. 

## 2016-03-13 NOTE — ED Triage Notes (Signed)
Patient came in with nausea and vomiting, BP180/120 HR 130, temp 101.7. Patient was see here on wed with nausea vomiting and UTI. Patient received Zofran 4 mg at 1559 and 200 ml of NS by EMS.

## 2016-03-13 NOTE — ED Notes (Signed)
No respiratory or acute distress noted alert and oriented x 3 no reaction to medication noted call light in reach. 

## 2016-03-13 NOTE — H&P (Signed)
History and Physical    Sandra [name]:[id number] DOB: [birth year] DOA: 03/13/2016  Referring MD/NP/PA:   PCP: No PCP Per Patient   Patient coming from:  The patient is coming from home.  At baseline, pt is independent for most of ADL.   Chief Complaint:  Fever, nausea, vomiting, suprapubic abdominal pain, left flank pain  HPI: Sandra [name] is a 46 y.o. female with medical history significant of hypertension, hyperlipidemia, GERD, depression, anxiety, OSA not on CPAP, obesity, panic attack, gastroparesis, who presents with nausea, vomiting, suprapubic abdominal pain in the left flank pain.  Patient states that she has fever, nausea, vomiting, suprapubic pain and left flank pain for several days. No diarrhea. She was seen on [place] in ED, and diagnosed with UTI. She was started with Keflex. She continues to have nausea and vomiting, could barely take any pills or food orally. Her abdominal and left flank pain is constant, 7 out of 10 in severity, nonradiating. Patient denies increased urinary frequency, dysuria or burning on urination. No hematuria. Patient does not have for respiratory symptoms. No chest pain, unilateral weakness.  ED Course: pt was found to have positive urinalysis with small amount of leukocytes, negative pregnancy test, lipase is 37, WBC 8.9, lactate 1.06, potassium 3.0, mild acute renal injury, temperature 101.2, tachycardia, tachypnea, oxygen saturation 92-95% on room air. Chest x-ray negative. 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 Ct-renal stone showed medullary nephrocalcinosis, mild fatty liver and small amount of gallbladder sludge. No evidence of cholecystitis.     Review of Systems:   General: has fevers, chills, no changes in body weight, has poor appetite, has fatigue HEENT: no blurry vision, hearing changes or sore throat Respiratory: no dyspnea, coughing, wheezing CV: no chest pain, no  palpitations GI: has nausea, vomiting, abdominal pain, no diarrhea, constipation GU: no dysuria, burning on urination, increased urinary frequency, hematuria. Has left flank pain. Ext: no leg edema Neuro: no unilateral weakness, numbness, or tingling, no vision change or hearing loss Skin: no rash, no skin tear. MSK: No muscle spasm, no deformity, no limitation of range of movement in spin Heme: No easy bruising.  Travel history: No recent long distant travel.  Allergy:  Allergies  Allergen Reactions  . Iodides Hives  . Imitrex [Sumatriptan]     Pt states she takes imitrex injection-"sometimes it works and sometimes doesn't"  . Toradol [Ketorolac Tromethamine] Hives  . Metoclopramide Hcl Anxiety    Past Medical History:  Diagnosis Date  . Anemia    a. Mild with Hgb 11.2, 09/2012. UA with mod Hgb. Instructed to f/u PCP.  Marland Kitchen Anxiety   . Anxiety   . Atrial fibrillation with RVR (HCC)    a. Dx 09/2012, spont conv to NSR.  Marland Kitchen Chronic lower back pain   . Depression   . Gastroparesis   . GERD (gastroesophageal reflux disease)   . Gestational diabetes   . H/O echocardiogram    a. 02/2010: done for htn/tachy while pregnant - EF 55-60%, grade 1 d/dysf, mildly dilated LA.  . High cholesterol   . Hypertension   . Kidney stones   . Migraines   . Obesity   . Panic attack   . Restless legs   . Sleep apnea     Past Surgical History:  Procedure Laterality Date  . BACK SURGERY    . CESAREAN SECTION  2012  . CYSTOSCOPY W/ URETERAL STENT PLACEMENT  2006  . CYSTOSCOPY/RETROGRADE/URETEROSCOPY/STONE EXTRACTION WITH BASKET  1987  .  DILATION AND CURETTAGE OF UTERUS  2006; 2010  . TUBAL LIGATION  2012    Social History:  reports that she has never smoked. She has never used smokeless tobacco. She reports that she does not drink alcohol or use drugs.  Family History:  Family History  Problem Relation Age of Onset  . Diabetes Mother   . Hypertension Mother   . Migraines Father   .  Migraines Brother      Prior to Admission medications   Medication Sig Start Date End Date Taking? Authorizing Provider  busPIRone (BUSPAR) 10 MG tablet Take 10 mg by mouth 3 (three) times daily.    Yes Historical Provider, MD  cephALEXin (KEFLEX) 500 MG capsule Take 1 capsule (500 mg total) by mouth 4 (four) times daily. 03/10/16  Yes Shari Upstill, PA-C  citalopram (CELEXA) 20 MG tablet Take 20 mg by mouth daily.   Yes Historical Provider, MD  Dexlansoprazole 30 MG capsule Take 1 capsule (30 mg total) by mouth daily. Patient taking differently: Take 30 mg by mouth 2 (two) times daily.  08/16/15  Yes Nicole Pisciotta, PA-C  mirtazapine (REMERON) 15 MG tablet Take 15 mg by mouth at bedtime.   Yes Historical Provider, MD  potassium chloride SA (K-DUR,KLOR-CON) 20 MEQ tablet Take 1 tablet (20 mEq total) by mouth 2 (two) times daily. 03/10/16 03/13/16 Yes Shari Upstill, PA-C  pregabalin (LYRICA) 300 MG capsule Take 300 mg by mouth 3 (three) times daily as needed (pain).    Yes Historical Provider, MD  promethazine (PHENERGAN) 25 MG tablet Take 1 tablet (25 mg total) by mouth every 6 (six) hours as needed for nausea or vomiting. 03/10/16  Yes Elpidio Anis, PA-C  Tapentadol HCl (NUCYNTA) 100 MG TABS Take 100 mg by mouth 4 (four) times daily as needed (pain).    Yes Historical Provider, MD  tiZANidine (ZANAFLEX) 4 MG tablet Take 4 mg by mouth 4 (four) times daily.   Yes Historical Provider, MD  traZODone (DESYREL) 100 MG tablet Take 100-400 mg by mouth at bedtime. And if needed Can take up to 400mg  at bedtime for sleep   Yes Historical Provider, MD  zolpidem (AMBIEN) 10 MG tablet Take 10 mg by mouth at bedtime.   Yes Historical Provider, MD  buprenorphine (BUTRANS) 20 MCG/HR PTWK patch Place 20 mcg onto the skin once a week. When needed for pain or addiction to opiods    Historical Provider, MD  clindamycin (CLEOCIN) 150 MG capsule Take 3 capsules (450 mg total) by mouth 3 (three) times daily. Patient not  taking: Reported on 03/09/2016 08/16/15   Joni Reining Pisciotta, PA-C  hydrOXYzine (ATARAX/VISTARIL) 25 MG tablet Take 1 tablet (25 mg total) by mouth every 6 (six) hours as needed for anxiety. Patient not taking: Reported on 03/09/2016 01/01/15   Danelle Berry, PA-C    Physical Exam: Vitals:   03/13/16 2032 03/13/16 2100 03/13/16 2200 03/13/16 2230  BP: 134/95 140/96 117/84 121/89  Pulse: 94 91 90 87  Resp: 21 23 (!) 27 18  Temp:      TempSrc:      SpO2: 93% 95% 95% 95%  Weight:       General: Not in acute distress HEENT:       Eyes: PERRL, EOMI, no scleral icterus.       ENT: No discharge from the ears and nose, no pharynx injection, no tonsillar enlargement.        Neck: No JVD, no bruit, no mass felt. Heme: No  neck lymph node enlargement. Cardiac: S1/S2, RRR, No murmurs, No gallops or rubs. Respiratory: No rales, wheezing, rhonchi or rubs. GI: Soft, nondistended, Has mild tenderness over suprapubic area, no rebound pain, no organomegaly, BS present. GU: No hematuria. Has left CVA tenderness. Ext: No pitting leg edema bilaterally. 2+DP/PT pulse bilaterally. Musculoskeletal: No joint deformities, No joint redness or warmth, no limitation of ROM in spin. Skin: No rashes.  Neuro: Alert, oriented X3, cranial nerves II-XII grossly intact, moves all extremities normally.  Psych: Patient is not psychotic, no suicidal or hemocidal ideation.  Labs on Admission: I have personally reviewed following labs and imaging studies  CBC:  Recent Labs Lab 03/09/16 1908 03/13/16 1803  WBC 11.4* 8.9  NEUTROABS  --  6.4  HGB 14.0 14.2  HCT 42.3 42.3  MCV 81.8 81.5  PLT 219 257   Basic Metabolic Panel:  Recent Labs Lab 03/09/16 1908 03/13/16 1803  NA 139 138  K 3.0* 3.0*  CL 107 102  CO2 24 23  GLUCOSE 183* 123*  BUN 14 19  CREATININE 0.94 1.18*  CALCIUM 9.5 10.0   GFR: CrCl cannot be calculated (Unknown ideal weight.). Liver Function Tests:  Recent Labs Lab 03/09/16 1908  03/13/16 1803  AST 18 51*  ALT 34 136*  ALKPHOS 43 47  BILITOT 0.7 1.0  PROT 7.6 8.2*  ALBUMIN 4.6 4.5    Recent Labs Lab 03/09/16 1908 03/13/16 1803  LIPASE 36 37   No results for input(s): AMMONIA in the last 168 hours. Coagulation Profile: No results for input(s): INR, PROTIME in the last 168 hours. Cardiac Enzymes: No results for input(s): CKTOTAL, CKMB, CKMBINDEX, TROPONINI in the last 168 hours. BNP (last 3 results) No results for input(s): PROBNP in the last 8760 hours. HbA1C: No results for input(s): HGBA1C in the last 72 hours. CBG: No results for input(s): GLUCAP in the last 168 hours. Lipid Profile: No results for input(s): CHOL, HDL, LDLCALC, TRIG, CHOLHDL, LDLDIRECT in the last 72 hours. Thyroid Function Tests: No results for input(s): TSH, T4TOTAL, FREET4, T3FREE, THYROIDAB in the last 72 hours. Anemia Panel: No results for input(s): VITAMINB12, FOLATE, FERRITIN, TIBC, IRON, RETICCTPCT in the last 72 hours. Urine analysis:    Component Value Date/Time   COLORURINE AMBER (A) 03/13/2016 1653   APPEARANCEUR CLOUDY (A) 03/13/2016 1653   LABSPEC 1.027 03/13/2016 1653   PHURINE 5.0 03/13/2016 1653   GLUCOSEU NEGATIVE 03/13/2016 1653   HGBUR NEGATIVE 03/13/2016 1653   BILIRUBINUR SMALL (A) 03/13/2016 1653   KETONESUR 5 (A) 03/13/2016 1653   PROTEINUR 100 (A) 03/13/2016 1653   UROBILINOGEN 0.2 04/10/2014 2229   NITRITE NEGATIVE 03/13/2016 1653   LEUKOCYTESUR MODERATE (A) 03/13/2016 1653   Sepsis Labs: @LABRCNTIP (procalcitonin:4,lacticidven:4) )No results found for this or any previous visit (from the past 240 hour(s)).   Radiological Exams on Admission: Dg Chest 2 View  Result Date: 03/13/2016 CLINICAL DATA:  Nausea and vomiting and shortness of Breath EXAM: CHEST  2 VIEW COMPARISON:  01/15/2014 FINDINGS: The heart size and mediastinal contours are within normal limits. Both lungs are clear. The visualized skeletal structures are unremarkable. IMPRESSION:  No active cardiopulmonary disease. Electronically Signed   By: Alcide CleverMark  Lukens M.D.   On: 03/13/2016 18:30   [place] Abdomen Complete  Result Date: 03/13/2016 CLINICAL DATA:  Patient with nausea, vomiting and abdominal pain. EXAM: ABDOMEN ULTRASOUND COMPLETE COMPARISON:  CT abdomen pelvis 01/14/2014. FINDINGS: Gallbladder: No gallstones or wall thickening visualized. No sonographic Murphy sign noted by sonographer. Common bile  duct: Diameter: 4 mm Liver: Diffusely increased in echogenicity. No focal lesion identified. IVC: No abnormality visualized. Pancreas: Visualized portion unremarkable. Spleen: Size and appearance within normal limits. Right Kidney: Length: 11.6 cm. No hydronephrosis. Renal cortical thinning. Medullary echogenic foci. Left Kidney: Length: 12.0 cm. Renal cortical thinning. No hydronephrosis. Medullary echogenic foci. Abdominal aorta: No aneurysm visualized. Other findings: None. IMPRESSION: No acute process. No cholelithiasis or sonographic evidence for acute cholecystitis. No hydronephrosis. Findings suggestive of medullary nephrocalcinosis. Electronically Signed   By: Annia Belt M.D.   On: 03/13/2016 20:40   Ct Renal Stone Study  Result Date: 03/13/2016 CLINICAL DATA:  46 year old female with nausea vomiting. Recent UTI. EXAM: CT ABDOMEN AND PELVIS WITHOUT CONTRAST TECHNIQUE: Multidetector CT imaging of the abdomen and pelvis was performed following the standard protocol without IV contrast. COMPARISON:  Abdominal ultrasound dated 03/13/2016 and CT dated 01/14/2014 FINDINGS: Evaluation of this exam is limited in the absence of intravenous contrast. Lower chest: Minimal bibasilar atelectasis/ scarring. The visualized lung bases are otherwise clear. No intra-abdominal free air or free fluid. Hepatobiliary: Mild diffuse fatty infiltration of the liver. Small amount of layering sludge noted within the gallbladder. No pericholecystic fluid. Pancreas: Unremarkable. No pancreatic ductal dilatation  or surrounding inflammatory changes. Spleen: Normal in size without focal abnormality. Adrenals/Urinary Tract: The adrenal glands appear unremarkable. There is mild bilateral renal atrophy. There is calcifications of the renal medulla bilaterally as seen on the prior studies and most compatible with medullary nephrocalcinosis. There is no hydronephrosis on either side. No obstructing stone identified. The visualized ureters and urinary bladder appear unremarkable. Stomach/Bowel: There is no evidence of bowel obstruction or active inflammation. Normal appendix. Vascular/Lymphatic: The abdominal aorta and IVC are grossly unremarkable on this noncontrast study. No portal venous gas identified. There is no adenopathy. Reproductive: The uterus is anteverted and grossly unremarkable. The ovaries appear unremarkable as well. Other: None Musculoskeletal: No acute or significant osseous findings. IMPRESSION: No acute intra-abdominopelvic pathology. No evidence of bowel obstruction or active inflammation. Normal appendix. Medullary nephrocalcinosis.  No hydronephrosis or obstructing stone. Mild fatty liver and small amount of gallbladder sludge. Electronically Signed   By: Elgie Collard M.D.   On: 03/13/2016 23:31     EKG: Independently reviewed.  Sinus tachycardia, QTC 434, LAD, no ischemic change.  Assessment/Plan Principal Problem:   Pyelonephritis Active Problems:   Essential hypertension   GERD (gastroesophageal reflux disease)   Gastroparesis   Sepsis (HCC)   Depression   AKI (acute kidney injury) (HCC)   Hypokalemia   Pyelonephritis and sepsis: Patient has nausea, vomiting, left CVA tenderness, positive urinalysis for UTI, consistent with pyelonephritis. Patient meets criteria for sepsis with fever, tachycardia and tachypnea. Lactate is normal. Hemodynamically stable currently. No obstructive stone on CT scan. -  Place on med-surg bed for obs -  Ceftriaxone by IV - Follow up results of urine  and blood cx and amend antibiotic regimen if needed per sensitivity results - prn Zofran for nausea - will get Procalcitonin and trend lactic acid levels per sepsis protocol. -  IVF: 4.5L of NS bolus in ED, followed by 125 cc/h   GERD: -Protonix  Gastroparesis: -prn zofran  Hypokalemia: K= 3.0 on admission. - Repleted - Check Mg level  Mild AKI: Cre 1/18. Likely due to UTI and prerenal secondary to dehydration - IVF as above - Follow up renal function by BMP  Depression and anxiety: Stable, no suicidal or homicidal ideations. -Continue home medications: Celexa, BuSpar, Remeron -prn Ativan  DVT  ppx: SQ Lovenox Code Status: Full code Family Communication: None at bed side. Disposition Plan:  Anticipate discharge back to previous home environment Consults called:  none Admission status: Obs / tele   Date of Service 03/14/2016    Lorretta Harp [facility] Pager [phone]  If 7PM-7AM, please contact night-coverage [email or web address] Password [facility] 03/14/2016, 12:15 AM

## 2016-03-13 NOTE — ED Triage Notes (Signed)
Patient stated she have not been able to keep her  Antibiotic down due to nausea and vomiting. Pt stated she goes to the pain clinic and she does not have anything for pain. Pt is having nausea and vomiting.

## 2016-03-13 NOTE — ED Notes (Signed)
Bed: WA18 Expected date:  Expected time:  Means of arrival:  Comments: ems 

## 2016-03-13 NOTE — Progress Notes (Signed)
Pharmacy Antibiotic Note  Clairessa Karolee OhsK [place] is a 46 y.o. female admitted on 03/13/2016 with UTI.  Pharmacy has been consulted for ceftriaxone dosing.  Plan: ceftriaxone 2 gr IV x1, then ceftriaxone 1 gr IV q24h    Dosage will likely remain stable at above dosage  and need for further dosage adjustment appears unlikely at present.    Will sign off at this time.  Please reconsult if a change in clinical status warrants re-evaluation of dosage.     Temp (24hrs), Avg:100.2 F (37.9 C), Min:99.1 F (37.3 C), Max:101.2 F (38.4 C)   Recent Labs Lab 03/09/16 1908  WBC 11.4*  CREATININE 0.94    CrCl cannot be calculated (Unknown ideal weight.).    Allergies  Allergen Reactions  . Iodides Hives  . Imitrex [Sumatriptan]     Pt states she takes imitrex injection-"sometimes it works and sometimes doesn't"  . Toradol [Ketorolac Tromethamine] Hives  . Metoclopramide Hcl Anxiety    Antimicrobials this admission: 1/27 ceftriaxone >>    Dose adjustments this admission:   Microbiology results: 1/27 UCx: sent  1/27 influenza panel: sent    Thank you for allowing pharmacy to be a part of this patient's care.   Adalberto ColeNikola Garyn Arlotta, PharmD, BCPS Pager [phone] 03/13/2016 6:28 PM

## 2016-03-13 NOTE — ED Provider Notes (Signed)
[facility]-EMERGENCY DEPT Provider Note   CSN: [id number] Arrival date & time: 03/13/16  1613     History   Chief Complaint Chief Complaint  Patient presents with  . Nausea  . Shortness of Breath  . Emesis    HPI Sandra [name] is a 46 y.o. female.  HPI Sandra [name] is a 46 y.o. female with history of chronic pain, depression, anxiety, A. fib, gastroparesis, presents to emergency department complaining of nausea, vomiting, fever, chills, diarrhea, lower back pain, abdominal pain, shortness of breath. Symptoms started approximately 1 week ago. She was seen here 4 days ago for the same, was diagnosed with UTI, given Rocephin emergency department along with IV fluids, was given pain medications and anti-emetics, she states she felt better when in emergency department went home. Since yesterday her symptoms him to get worse. She is now reporting fever. She denies any sore throat, congestion, cough. She does report some shortness of breath. She denies any chest pain. She states she has not been able to keep down her antibiotics, her anti-medics, her regular chronic pain medications.  Past Medical History:  Diagnosis Date  . Anemia    a. Mild with Hgb 11.2, 09/2012. UA with mod Hgb. Instructed to f/u PCP.  Marland Kitchen Anxiety   . Anxiety   . Atrial fibrillation with RVR (HCC)    a. Dx 09/2012, spont conv to NSR.  Marland Kitchen Chronic lower back pain   . Depression   . Gastroparesis   . GERD (gastroesophageal reflux disease)   . Gestational diabetes   . H/O echocardiogram    a. 02/2010: done for htn/tachy while pregnant - EF 55-60%, grade 1 d/dysf, mildly dilated LA.  . High cholesterol   . Hypertension   . Kidney stones   . Migraines   . Obesity   . Panic attack   . Restless legs   . Sleep apnea     Patient Active Problem List   Diagnosis Date Noted  . Severe recurrent major depression without psychotic features (HCC) 11/15/2014    Class: Chronic  . Major depressive disorder, single episode,  severe without psychosis (HCC) 11/15/2014  . Altered mental status 01/15/2014  . Acute encephalopathy 02/13/2013  . Acute respiratory failure (HCC) 02/13/2013  . Aspiration pneumonia (HCC) 02/13/2013  . Lumbar spondylosis 04/21/2011  . Trochanteric bursitis 04/21/2011  . Myofascial pain 04/21/2011  . TACHYCARDIA 03/06/2010  . DYSPNEA 01/06/2010  . RESTLESS LEG SYNDROME 12/11/2009  . MIGRAINE HEADACHE 12/11/2009  . HYPERTENSION 12/11/2009  . GERD 12/11/2009  . GASTROPARESIS 12/11/2009  . DIABETES MELLITUS, GESTATIONAL, HX OF 12/11/2009    Past Surgical History:  Procedure Laterality Date  . BACK SURGERY    . CESAREAN SECTION  2012  . CYSTOSCOPY W/ URETERAL STENT PLACEMENT  2006  . CYSTOSCOPY/RETROGRADE/URETEROSCOPY/STONE EXTRACTION WITH BASKET  1987  . DILATION AND CURETTAGE OF UTERUS  2006; 2010  . TUBAL LIGATION  2012    OB History    No data available       Home Medications    Prior to Admission medications   Medication Sig Start Date End Date Taking? Authorizing Provider  aspirin-acetaminophen-caffeine (EXCEDRIN MIGRAINE) [phone] MG tablet Take 2 tablets by mouth every 6 (six) hours as needed for headache.    Historical Provider, MD  atorvastatin (LIPITOR) 40 MG tablet Take 40 mg by mouth daily. Reported on 08/04/2015    Historical Provider, MD  buprenorphine (BUTRANS) 20 MCG/HR PTWK patch Place 20 mcg onto the skin once a  week. When needed for pain or addiction to opiods    Historical Provider, MD  busPIRone (BUSPAR) 10 MG tablet Take 10 mg by mouth 3 (three) times daily.     Historical Provider, MD  cephALEXin (KEFLEX) 500 MG capsule Take 1 capsule (500 mg total) by mouth 4 (four) times daily. 03/10/16   Elpidio AnisShari Upstill, PA-C  citalopram (CELEXA) 20 MG tablet Take 20 mg by mouth daily.    Historical Provider, MD  clindamycin (CLEOCIN) 150 MG capsule Take 3 capsules (450 mg total) by mouth 3 (three) times daily. Patient not taking: Reported on 03/09/2016 08/16/15   Joni ReiningNicole  Pisciotta, PA-C  Dexlansoprazole 30 MG capsule Take 1 capsule (30 mg total) by mouth daily. Patient taking differently: Take 30 mg by mouth 2 (two) times daily.  08/16/15   Nicole Pisciotta, PA-C  hydrOXYzine (ATARAX/VISTARIL) 25 MG tablet Take 1 tablet (25 mg total) by mouth every 6 (six) hours as needed for anxiety. Patient not taking: Reported on 03/09/2016 01/01/15   [name], PA-C  mirtazapine (REMERON) 15 MG tablet Take 15 mg by mouth at bedtime.    Historical Provider, MD  potassium chloride SA (K-DUR,KLOR-CON) 20 MEQ tablet Take 1 tablet (20 mEq total) by mouth 2 (two) times daily. 03/10/16 03/13/16  Elpidio AnisShari Upstill, PA-C  pregabalin (LYRICA) 300 MG capsule Take 300 mg by mouth 3 (three) times daily as needed (pain).     Historical Provider, MD  promethazine (PHENERGAN) 25 MG tablet Take 1 tablet (25 mg total) by mouth every 6 (six) hours as needed for nausea or vomiting. 03/10/16   Elpidio AnisShari Upstill, PA-C  Tapentadol HCl (NUCYNTA) 100 MG TABS Take 100 mg by mouth 4 (four) times daily as needed (pain).     Historical Provider, MD  tiZANidine (ZANAFLEX) 4 MG tablet Take 4 mg by mouth 4 (four) times daily.    Historical Provider, MD  traZODone (DESYREL) 100 MG tablet Take 100-400 mg by mouth at bedtime. And if needed Can take up to 400mg  at bedtime for sleep    Historical Provider, MD  zolpidem (AMBIEN) 10 MG tablet Take 10 mg by mouth at bedtime.    Historical Provider, MD    Family History Family History  Problem Relation Age of Onset  . Diabetes Mother   . Hypertension Mother   . Migraines Father   . Migraines Brother     Social History Social History  Substance Use Topics  . Smoking status: Never Smoker  . Smokeless tobacco: Never Used  . Alcohol use No     Allergies   Iodides; Imitrex [sumatriptan]; Toradol [ketorolac tromethamine]; and Metoclopramide hcl   Review of Systems Review of Systems  Constitutional: Positive for chills. Negative for fever.  HENT: Negative for  congestion and sore throat.   Respiratory: Positive for shortness of breath. Negative for cough and chest tightness.   Cardiovascular: Negative for chest pain, palpitations and leg swelling.  Gastrointestinal: Positive for abdominal pain, diarrhea, nausea and vomiting.  Genitourinary: Positive for flank pain. Negative for dysuria, pelvic pain, vaginal bleeding, vaginal discharge and vaginal pain.  Musculoskeletal: Positive for arthralgias and myalgias. Negative for neck pain and neck stiffness.  Skin: Negative for rash.  Neurological: Positive for headaches. Negative for dizziness and weakness.  All other systems reviewed and are negative.    Physical Exam Updated Vital Signs BP (!) 157/112 (BP Location: Left Arm)   Pulse 111   Temp 101.2 F (38.4 C) (Rectal)   Resp 19   LMP  03/04/2016   SpO2 98%   Physical Exam  Constitutional: She is oriented to person, place, and time. She appears well-developed and well-nourished.  Actively vomiting  HENT:  Head: Normocephalic.  Right Ear: External ear normal.  Left Ear: External ear normal.  Nose: Nose normal.  Mouth/Throat: No oropharyngeal exudate.  Eyes: Conjunctivae are normal.  Neck: Normal range of motion. Neck supple.  No meningismus  Cardiovascular: Normal rate, regular rhythm and normal heart sounds.   Pulmonary/Chest: Effort normal and breath sounds normal. No respiratory distress. She has no wheezes. She has no rales.  Abdominal: Soft. Bowel sounds are normal. She exhibits no distension. There is tenderness. There is no rebound.  Diffuse tenderness over abdomen. Bilateral CVA tenderness  Musculoskeletal: She exhibits no edema.  Neurological: She is alert and oriented to person, place, and time.  Skin: Skin is warm and dry.  Psychiatric: She has a normal mood and affect. Her behavior is normal.  Nursing note and vitals reviewed.    ED Treatments / Results  Labs (all labs ordered are listed, but only abnormal results are  displayed) Labs Reviewed  CULTURE, BLOOD (ROUTINE X 2)  CULTURE, BLOOD (ROUTINE X 2)  LIPASE, BLOOD  COMPREHENSIVE METABOLIC PANEL  CBC  URINALYSIS, ROUTINE W REFLEX MICROSCOPIC  DIFFERENTIAL  POC URINE PREG, ED  I-STAT CG4 LACTIC ACID, ED  I-STAT BETA HCG BLOOD, ED ([facility], [facility], [facility] ONLY)    EKG  EKG Interpretation None       Radiology No results found.  Procedures Procedures (including critical care time)  Medications Ordered in ED Medications  HYDROmorphone (DILAUDID) injection 1 mg (not administered)  ondansetron (ZOFRAN) injection 4 mg (not administered)  sodium chloride 0.9 % bolus 1,000 mL (not administered)     Initial Impression / Assessment and Plan / ED Course  I have reviewed the triage vital signs and the nursing notes.  Pertinent labs & imaging results that were available during my care of the patient were reviewed by me and considered in my medical decision making (see chart for details).     Patient in emergency department with fever, nausea, vomiting, diarrhea, abdominal pain, flank pain for approximately a week. Seen here 4 days ago, diagnosed with UTI. Started on Keflex. She is unable to keep anything down at this time including antibiotics and her regular medications. Patient is on pain medications regularly for chronic pain and chronic migraines. Her urine from 4 days ago does show too numerous skeletal blood cells and many bacteria, however urine culture was not sent. She denies any urinary symptoms. She does report urinary infections in the past with no symptoms. We'll repeat urinalysis and cultures today, will do blood cultures and labs. Patient is tachycardic and febrile, however has normal blood pressure at this time. IV fluids started. I do question some of the withdrawal component to her not feeling well from not having her periods for several days. Will give a dose of dilaudid.  Patient continues to have pain and nausea and vomiting. Her LFTs  slightly elevated. She has not had any recent workup for gallbladder problems, we will get ultrasound abdomen. We'll try some more pain medications and antiemetics.   CODE sepsis is called, patient is febrile 101.2, tachycardic up into 130s, tachypnea, blood pressure remains normal. We'll give fluids and antibiotics presumably for pyelonephritis. 2 g of Rocephin given.  Patient lactic acid and low blood cell count are normal. Patient's heart rate improved. Blood pressure remains normal. I question whether patient's  vital signs were abnormal due to some withdrawal from opiates and benzos, in addition from her fever. She is feeling slightly better, however continues to have nausea. Suspect pyelonephritis as the cause of her symptoms. Will admit. I am worried that pt will be unable to keep down oral antibiotics onces she goes home again.   Vitals:   03/13/16 2100 03/13/16 2200 03/13/16 2230 03/14/16 0051  BP: 140/96 117/84 121/89 129/96  Pulse: 91 90 87 91  Resp: 23 (!) 27 18 20   Temp:      TempSrc:      SpO2: 95% 95% 95% 98%  Weight:         Final Clinical Impressions(s) / ED Diagnoses   Final diagnoses:  Pyelonephritis  Nausea and vomiting, intractability of vomiting not specified, unspecified vomiting type    New Prescriptions New Prescriptions   No medications on file     Jaynie Crumble, PA-C 03/14/16 0054    Azalia Bilis, MD 03/14/16 [phone]

## 2016-03-14 DIAGNOSIS — G8929 Other chronic pain: Secondary | ICD-10-CM | POA: Diagnosis present

## 2016-03-14 DIAGNOSIS — G4733 Obstructive sleep apnea (adult) (pediatric): Secondary | ICD-10-CM | POA: Diagnosis present

## 2016-03-14 DIAGNOSIS — Z8249 Family history of ischemic heart disease and other diseases of the circulatory system: Secondary | ICD-10-CM | POA: Diagnosis not present

## 2016-03-14 DIAGNOSIS — Z23 Encounter for immunization: Secondary | ICD-10-CM | POA: Diagnosis not present

## 2016-03-14 DIAGNOSIS — Z6831 Body mass index (BMI) 31.0-31.9, adult: Secondary | ICD-10-CM | POA: Diagnosis not present

## 2016-03-14 DIAGNOSIS — I10 Essential (primary) hypertension: Secondary | ICD-10-CM | POA: Diagnosis present

## 2016-03-14 DIAGNOSIS — N179 Acute kidney failure, unspecified: Secondary | ICD-10-CM

## 2016-03-14 DIAGNOSIS — N12 Tubulo-interstitial nephritis, not specified as acute or chronic: Secondary | ICD-10-CM

## 2016-03-14 DIAGNOSIS — F329 Major depressive disorder, single episode, unspecified: Secondary | ICD-10-CM | POA: Diagnosis present

## 2016-03-14 DIAGNOSIS — Z888 Allergy status to other drugs, medicaments and biological substances status: Secondary | ICD-10-CM | POA: Diagnosis not present

## 2016-03-14 DIAGNOSIS — K219 Gastro-esophageal reflux disease without esophagitis: Secondary | ICD-10-CM

## 2016-03-14 DIAGNOSIS — F410 Panic disorder [episodic paroxysmal anxiety] without agoraphobia: Secondary | ICD-10-CM | POA: Diagnosis present

## 2016-03-14 DIAGNOSIS — E876 Hypokalemia: Secondary | ICD-10-CM | POA: Diagnosis present

## 2016-03-14 DIAGNOSIS — Z833 Family history of diabetes mellitus: Secondary | ICD-10-CM | POA: Diagnosis not present

## 2016-03-14 DIAGNOSIS — E669 Obesity, unspecified: Secondary | ICD-10-CM | POA: Diagnosis present

## 2016-03-14 DIAGNOSIS — G2581 Restless legs syndrome: Secondary | ICD-10-CM | POA: Diagnosis present

## 2016-03-14 DIAGNOSIS — K3184 Gastroparesis: Secondary | ICD-10-CM | POA: Diagnosis present

## 2016-03-14 DIAGNOSIS — Z79899 Other long term (current) drug therapy: Secondary | ICD-10-CM | POA: Diagnosis not present

## 2016-03-14 DIAGNOSIS — E860 Dehydration: Secondary | ICD-10-CM | POA: Diagnosis present

## 2016-03-14 DIAGNOSIS — G43909 Migraine, unspecified, not intractable, without status migrainosus: Secondary | ICD-10-CM | POA: Diagnosis present

## 2016-03-14 DIAGNOSIS — A419 Sepsis, unspecified organism: Secondary | ICD-10-CM | POA: Diagnosis not present

## 2016-03-14 LAB — BASIC METABOLIC PANEL
Anion gap: 6 (ref 5–15)
BUN: 16 mg/dL (ref 6–20)
CALCIUM: 7.8 mg/dL — AB (ref 8.9–10.3)
CO2: 25 mmol/L (ref 22–32)
Chloride: 106 mmol/L (ref 101–111)
Creatinine, Ser: 0.81 mg/dL (ref 0.44–1.00)
Glucose, Bld: 96 mg/dL (ref 65–99)
Potassium: 3.2 mmol/L — ABNORMAL LOW (ref 3.5–5.1)
SODIUM: 137 mmol/L (ref 135–145)

## 2016-03-14 LAB — CBC
HCT: 31.2 % — ABNORMAL LOW (ref 36.0–46.0)
HEMOGLOBIN: 10.2 g/dL — AB (ref 12.0–15.0)
MCH: 27.6 pg (ref 26.0–34.0)
MCHC: 32.7 g/dL (ref 30.0–36.0)
MCV: 84.6 fL (ref 78.0–100.0)
Platelets: 149 10*3/uL — ABNORMAL LOW (ref 150–400)
RBC: 3.69 MIL/uL — AB (ref 3.87–5.11)
RDW: 14.5 % (ref 11.5–15.5)
WBC: 5 10*3/uL (ref 4.0–10.5)

## 2016-03-14 LAB — PROCALCITONIN

## 2016-03-14 LAB — MAGNESIUM: MAGNESIUM: 1.3 mg/dL — AB (ref 1.7–2.4)

## 2016-03-14 MED ORDER — SODIUM CHLORIDE 0.9 % IV SOLN
INTRAVENOUS | Status: AC
  Administered 2016-03-14: 125 mL/h via INTRAVENOUS

## 2016-03-14 MED ORDER — PANTOPRAZOLE SODIUM 40 MG PO TBEC
40.0000 mg | DELAYED_RELEASE_TABLET | Freq: Every day | ORAL | Status: DC
  Administered 2016-03-14: 40 mg via ORAL
  Filled 2016-03-14: qty 1

## 2016-03-14 MED ORDER — ZOLPIDEM TARTRATE 5 MG PO TABS
5.0000 mg | ORAL_TABLET | Freq: Every evening | ORAL | Status: DC | PRN
  Administered 2016-03-14: 5 mg via ORAL
  Filled 2016-03-14: qty 1

## 2016-03-14 MED ORDER — ONDANSETRON HCL 4 MG/2ML IJ SOLN: 4.0000 mg | Freq: Three times a day (TID) | INTRAMUSCULAR | Status: DC | PRN

## 2016-03-14 MED ORDER — BUPRENORPHINE 20 MCG/HR TD PTWK: 20.0000 ug | MEDICATED_PATCH | TRANSDERMAL | Status: DC

## 2016-03-14 MED ORDER — POTASSIUM CHLORIDE 20 MEQ/15ML (10%) PO SOLN
40.0000 meq | Freq: Once | ORAL | Status: AC
  Administered 2016-03-14: 40 meq via ORAL
  Filled 2016-03-14: qty 30

## 2016-03-14 MED ORDER — CITALOPRAM HYDROBROMIDE 20 MG PO TABS
20.0000 mg | ORAL_TABLET | Freq: Every day | ORAL | Status: DC
  Administered 2016-03-14 – 2016-03-16 (×2): 20 mg via ORAL
  Filled 2016-03-14 (×3): qty 1

## 2016-03-14 MED ORDER — MIRTAZAPINE 15 MG PO TABS
15.0000 mg | ORAL_TABLET | Freq: Every day | ORAL | Status: DC
  Administered 2016-03-14 – 2016-03-15 (×3): 15 mg via ORAL
  Filled 2016-03-14 (×3): qty 1

## 2016-03-14 MED ORDER — LEVOFLOXACIN IN D5W 750 MG/150ML IV SOLN
750.0000 mg | INTRAVENOUS | Status: DC
  Administered 2016-03-14 – 2016-03-15 (×2): 750 mg via INTRAVENOUS
  Filled 2016-03-14 (×3): qty 150

## 2016-03-14 MED ORDER — TIZANIDINE HCL 4 MG PO TABS
4.0000 mg | ORAL_TABLET | Freq: Four times a day (QID) | ORAL | Status: DC
  Administered 2016-03-14 – 2016-03-16 (×8): 4 mg via ORAL
  Filled 2016-03-14 (×9): qty 1

## 2016-03-14 MED ORDER — INFLUENZA VAC SPLIT QUAD 0.5 ML IM SUSY
0.5000 mL | PREFILLED_SYRINGE | INTRAMUSCULAR | Status: DC
  Filled 2016-03-14: qty 0.5

## 2016-03-14 MED ORDER — ACETAMINOPHEN 325 MG PO TABS: 650.0000 mg | ORAL_TABLET | Freq: Four times a day (QID) | ORAL | Status: DC | PRN

## 2016-03-14 MED ORDER — OXYCODONE HCL 5 MG PO TABS
10.0000 mg | ORAL_TABLET | ORAL | Status: DC | PRN
  Administered 2016-03-14 – 2016-03-16 (×6): 10 mg via ORAL
  Filled 2016-03-14 (×6): qty 2

## 2016-03-14 MED ORDER — POTASSIUM CHLORIDE CRYS ER 20 MEQ PO TBCR
40.0000 meq | EXTENDED_RELEASE_TABLET | ORAL | Status: AC
  Administered 2016-03-14 (×2): 40 meq via ORAL
  Filled 2016-03-14 (×2): qty 2

## 2016-03-14 MED ORDER — ZOLPIDEM TARTRATE 5 MG PO TABS
5.0000 mg | ORAL_TABLET | Freq: Every day | ORAL | Status: DC
  Administered 2016-03-14 – 2016-03-15 (×2): 5 mg via ORAL
  Filled 2016-03-14 (×2): qty 1

## 2016-03-14 MED ORDER — LORAZEPAM 2 MG/ML IJ SOLN
0.5000 mg | Freq: Four times a day (QID) | INTRAMUSCULAR | Status: DC | PRN
  Administered 2016-03-14 – 2016-03-16 (×4): 0.5 mg via INTRAVENOUS
  Filled 2016-03-14 (×4): qty 1

## 2016-03-14 MED ORDER — ZOLPIDEM TARTRATE 10 MG PO TABS: 10.0000 mg | ORAL_TABLET | Freq: Every day | ORAL | Status: DC

## 2016-03-14 MED ORDER — HYDROMORPHONE HCL 1 MG/ML IJ SOLN: 1.0000 mg | INTRAMUSCULAR | Status: DC | PRN

## 2016-03-14 MED ORDER — HYDROMORPHONE HCL 1 MG/ML IJ SOLN
1.0000 mg | INTRAMUSCULAR | Status: AC | PRN
  Administered 2016-03-14 (×3): 1 mg via INTRAVENOUS
  Filled 2016-03-14 (×3): qty 1

## 2016-03-14 MED ORDER — ACETAMINOPHEN 650 MG RE SUPP: 650.0000 mg | Freq: Four times a day (QID) | RECTAL | Status: DC | PRN

## 2016-03-14 MED ORDER — MAGNESIUM SULFATE 2 GM/50ML IV SOLN
2.0000 g | Freq: Once | INTRAVENOUS | Status: AC
  Administered 2016-03-14: 2 g via INTRAVENOUS
  Filled 2016-03-14: qty 50

## 2016-03-14 MED ORDER — HYDROMORPHONE HCL 1 MG/ML IJ SOLN
1.0000 mg | INTRAMUSCULAR | Status: DC | PRN
  Administered 2016-03-14 – 2016-03-15 (×3): 1 mg via INTRAVENOUS
  Filled 2016-03-14 (×3): qty 1

## 2016-03-14 MED ORDER — TRAZODONE HCL 100 MG PO TABS
100.0000 mg | ORAL_TABLET | Freq: Every day | ORAL | Status: DC
Start: 2016-03-14 — End: 2016-03-16
  Administered 2016-03-14: 400 mg via ORAL
  Administered 2016-03-14: 200 mg via ORAL
  Administered 2016-03-15: 100 mg via ORAL
  Administered 2016-03-15: 400 mg via ORAL
  Filled 2016-03-14 (×3): qty 4

## 2016-03-14 MED ORDER — ENOXAPARIN SODIUM 40 MG/0.4ML ~~LOC~~ SOLN
40.0000 mg | SUBCUTANEOUS | Status: DC
  Administered 2016-03-14 – 2016-03-15 (×2): 40 mg via SUBCUTANEOUS
  Filled 2016-03-14 (×3): qty 0.4

## 2016-03-14 MED ORDER — BUSPIRONE HCL 10 MG PO TABS
10.0000 mg | ORAL_TABLET | Freq: Three times a day (TID) | ORAL | Status: DC
  Administered 2016-03-14 – 2016-03-16 (×6): 10 mg via ORAL
  Filled 2016-03-14 (×2): qty 1
  Filled 2016-03-14: qty 2
  Filled 2016-03-14 (×2): qty 1
  Filled 2016-03-14: qty 2
  Filled 2016-03-14 (×2): qty 1
  Filled 2016-03-14: qty 2
  Filled 2016-03-14 (×2): qty 1
  Filled 2016-03-14 (×4): qty 2

## 2016-03-14 MED ORDER — PREGABALIN 75 MG PO CAPS: 300.0000 mg | ORAL_CAPSULE | Freq: Three times a day (TID) | ORAL | Status: DC | PRN

## 2016-03-14 MED ORDER — SODIUM CHLORIDE 0.9 % IV SOLN
INTRAVENOUS | Status: DC
  Administered 2016-03-14: 100 mL/h via INTRAVENOUS
  Administered 2016-03-14: 10:00:00 via INTRAVENOUS

## 2016-03-14 MED ORDER — SODIUM CHLORIDE 0.9% FLUSH
3.0000 mL | Freq: Two times a day (BID) | INTRAVENOUS | Status: DC
  Administered 2016-03-14 (×2): 3 mL via INTRAVENOUS
  Administered 2016-03-14: 10 mL via INTRAVENOUS
  Administered 2016-03-15 – 2016-03-16 (×2): 3 mL via INTRAVENOUS

## 2016-03-14 MED ORDER — ONDANSETRON HCL 4 MG/2ML IJ SOLN
4.0000 mg | Freq: Three times a day (TID) | INTRAMUSCULAR | Status: DC | PRN
  Administered 2016-03-14 – 2016-03-16 (×5): 4 mg via INTRAVENOUS
  Filled 2016-03-14 (×5): qty 2

## 2016-03-14 NOTE — Progress Notes (Addendum)
PROGRESS NOTE [facility]   [name]   WUJ:[id number] DOB: 08-22-1970  DOA: 03/13/2016 PCP: No PCP Per Patient   Brief Narrative:  Sandra [name] is a 46 y.o. female with medical history significant of hypertension, hyperlipidemia, GERD, depression, anxiety, OSA not on CPAP, obesity, panic attack, gastroparesis, who presents with nausea, vomiting, suprapubic abdominal pain and left flank pain. Patient was treated with abx last Wednesday with no resolution of the symptoms. Patient admitted for Pyelonephritis and IV abx.   Subjective: Patient seen and examined, doing slight better. Continues to c/o abdominal pain. Afebrile. No nausea or vomiting.   Assessment & Plan: Sepsis 2/2 Pyelonephritis- failed outpatient therapy.   Sepsis physiology resolved - patient hemodynamically stable  Started on Ceftriaxone - will switch to Levaquin  Follow up blood cultures  Continue IVF  Advance diet as tolerated   GERD: Protonix  Gastroparesis: prn zofran Advance diet as tolerated   Hypokalemia: K= 3.0 on admission, low mag  Repleted  Mild AKI: Cre 1/18. Likely due to UTI and prerenal secondary to dehydration Improving with IVF  Check BMP in AM  Depression and anxiety: Stable, no suicidal or homicidal ideations. Continue home medications: Celexa, BuSpar, Remeron prn Ativan  DVT prophylaxis: Lovenox  Code Status: FULL  Family Communication: None at bedside  Disposition Plan: Anticipate discharge home in 24-48 hrs   Consultants:   None   Procedures:   None   Antimicrobials:  Rocephin 03/13/16  Levaquin 03/14/16   Objective: Vitals:   03/14/16 0051 03/14/16 0116 03/14/16 0130 03/14/16 0617  BP: 129/96  (!) 148/99 124/80  Pulse: 91  95 82  Resp: 20  20 18   Temp:   97.8 F (36.6 C) 98 F (36.7 C)  TempSrc:   Oral Oral  SpO2: 98%  100% 98%  Weight:  83.5 kg (184 lb) 83.5 kg (184 lb)   Height:  5\' 4"  (1.626 m) 5\' 4"  (1.626 m)     Intake/Output  Summary (Last 24 hours) at 03/14/16 0937 Last data filed at 03/14/16 0600  Gross per 24 hour  Intake          1135.42 ml  Output             1050 ml  Net            85.42 ml   Filed Weights   03/13/16 1838 03/14/16 0116 03/14/16 0130  Weight: 90.7 kg (200 lb) 83.5 kg (184 lb) 83.5 kg (184 lb)    Examination:  General exam: NAD HEENT: AC/AT, PERRLA, OP moist and clear Respiratory system: CTA no wheezing or rales  Cardiovascular system: S1 & S2 heard, RRR. No JVD, murmurs, rubs or gallops Gastrointestinal system: Abdomen soft, non tenderness, positive CVA tenderness.  Central nervous system: Alert and oriented. No focal neurological deficits. Extremities: No pedal edema.  Skin: No rashes, lesions or ulcers Psychiatry: Judgement and insight appear normal. Mood & affect appropriate.    Data Reviewed: I have personally reviewed following labs and imaging studies  CBC:  Recent Labs Lab 03/09/16 1908 03/13/16 1803 03/14/16 0625  WBC 11.4* 8.9 5.0  NEUTROABS  --  6.4  --   HGB 14.0 14.2 10.2*  HCT 42.3 42.3 31.2*  MCV 81.8 81.5 84.6  PLT 219 257 149*   Basic Metabolic Panel:  Recent Labs Lab 03/09/16 1908 03/13/16 1803 03/14/16 0625  NA 139 138 137  K 3.0* 3.0* 3.2*  CL 107 102 106  CO2 24 23 25  GLUCOSE 183* 123* 96  BUN 14 19 16   CREATININE 0.94 1.18* 0.81  CALCIUM 9.5 10.0 7.8*  MG  --   --  1.3*   GFR: Estimated Creatinine Clearance: 92.6 mL/min (by C-G formula based on SCr of 0.81 mg/dL). Liver Function Tests:  Recent Labs Lab 03/09/16 1908 03/13/16 1803  AST 18 51*  ALT 34 136*  ALKPHOS 43 47  BILITOT 0.7 1.0  PROT 7.6 8.2*  ALBUMIN 4.6 4.5    Recent Labs Lab 03/09/16 1908 03/13/16 1803  LIPASE 36 37   No results for input(s): AMMONIA in the last 168 hours. Coagulation Profile: No results for input(s): INR, PROTIME in the last 168 hours. Cardiac Enzymes: No results for input(s): CKTOTAL, CKMB, CKMBINDEX, TROPONINI in the last 168  hours. BNP (last 3 results) No results for input(s): PROBNP in the last 8760 hours. HbA1C: No results for input(s): HGBA1C in the last 72 hours. CBG: No results for input(s): GLUCAP in the last 168 hours. Lipid Profile: No results for input(s): CHOL, HDL, LDLCALC, TRIG, CHOLHDL, LDLDIRECT in the last 72 hours. Thyroid Function Tests: No results for input(s): TSH, T4TOTAL, FREET4, T3FREE, THYROIDAB in the last 72 hours. Anemia Panel: No results for input(s): VITAMINB12, FOLATE, FERRITIN, TIBC, IRON, RETICCTPCT in the last 72 hours. Sepsis Labs:  Recent Labs Lab 03/13/16 1803 03/13/16 1832  PROCALCITON <0.10  --   LATICACIDVEN  --  1.06    No results found for this or any previous visit (from the past 240 hour(s)).     Radiology Studies: Dg Chest 2 View  Result Date: 03/13/2016 CLINICAL DATA:  Nausea and vomiting and shortness of Breath EXAM: CHEST  2 VIEW COMPARISON:  01/15/2014 FINDINGS: The heart size and mediastinal contours are within normal limits. Both lungs are clear. The visualized skeletal structures are unremarkable. IMPRESSION: No active cardiopulmonary disease. Electronically Signed   By: Alcide Clever M.D.   On: 03/13/2016 18:30   US Abdomen Complete  Result Date: 03/13/2016 CLINICAL DATA:  Patient with nausea, vomiting and abdominal pain. EXAM: ABDOMEN ULTRASOUND COMPLETE COMPARISON:  CT abdomen pelvis 01/14/2014. FINDINGS: Gallbladder: No gallstones or wall thickening visualized. No sonographic Murphy sign noted by sonographer. Common bile duct: Diameter: 4 mm Liver: Diffusely increased in echogenicity. No focal lesion identified. IVC: No abnormality visualized. Pancreas: Visualized portion unremarkable. Spleen: Size and appearance within normal limits. Right Kidney: Length: 11.6 cm. No hydronephrosis. Renal cortical thinning. Medullary echogenic foci. Left Kidney: Length: 12.0 cm. Renal cortical thinning. No hydronephrosis. Medullary echogenic foci. Abdominal aorta: No  aneurysm visualized. Other findings: None. IMPRESSION: No acute process. No cholelithiasis or sonographic evidence for acute cholecystitis. No hydronephrosis. Findings suggestive of medullary nephrocalcinosis. Electronically Signed   By: Annia Belt M.D.   On: 03/13/2016 20:40   Ct Renal Stone Study  Result Date: 03/13/2016 CLINICAL DATA:  46 year old female with nausea vomiting. Recent UTI. EXAM: CT ABDOMEN AND PELVIS WITHOUT CONTRAST TECHNIQUE: Multidetector CT imaging of the abdomen and pelvis was performed following the standard protocol without IV contrast. COMPARISON:  Abdominal ultrasound dated 03/13/2016 and CT dated 01/14/2014 FINDINGS: Evaluation of this exam is limited in the absence of intravenous contrast. Lower chest: Minimal bibasilar atelectasis/ scarring. The visualized lung bases are otherwise clear. No intra-abdominal free air or free fluid. Hepatobiliary: Mild diffuse fatty infiltration of the liver. Small amount of layering sludge noted within the gallbladder. No pericholecystic fluid. Pancreas: Unremarkable. No pancreatic ductal dilatation or surrounding inflammatory changes. Spleen: Normal in size without focal abnormality.  Adrenals/Urinary Tract: The adrenal glands appear unremarkable. There is mild bilateral renal atrophy. There is calcifications of the renal medulla bilaterally as seen on the prior studies and most compatible with medullary nephrocalcinosis. There is no hydronephrosis on either side. No obstructing stone identified. The visualized ureters and urinary bladder appear unremarkable. Stomach/Bowel: There is no evidence of bowel obstruction or active inflammation. Normal appendix. Vascular/Lymphatic: The abdominal aorta and IVC are grossly unremarkable on this noncontrast study. No portal venous gas identified. There is no adenopathy. Reproductive: The uterus is anteverted and grossly unremarkable. The ovaries appear unremarkable as well. Other: None Musculoskeletal: No acute  or significant osseous findings. IMPRESSION: No acute intra-abdominopelvic pathology. No evidence of bowel obstruction or active inflammation. Normal appendix. Medullary nephrocalcinosis.  No hydronephrosis or obstructing stone. Mild fatty liver and small amount of gallbladder sludge. Electronically Signed   By: Elgie CollardArash  Radparvar M.D.   On: 03/13/2016 23:31      Scheduled Meds: . sodium chloride   Intravenous STAT  . busPIRone  10 mg Oral TID  . cefTRIAXone (ROCEPHIN)  IV  1 g Intravenous Q24H  . citalopram  20 mg Oral Daily  . enoxaparin (LOVENOX) injection  40 mg Subcutaneous Q24H  . [START ON 03/15/2016] Influenza vac split quadrivalent PF  0.5 mL Intramuscular Tomorrow-1000  . mirtazapine  15 mg Oral QHS  . pantoprazole  40 mg Oral Daily  . potassium chloride  40 mEq Oral Q4H  . sodium chloride flush  3 mL Intravenous Q12H  . tiZANidine  4 mg Oral QID  . traZODone  100-400 mg Oral QHS  . zolpidem  5 mg Oral QHS   Continuous Infusions: . sodium chloride       LOS: 0 days    Latrelle DodrillEdwin Silva, MD [facility] Pager [phone]  If 7PM-7AM, please contact night-coverage [email or web address] Password [email or web address] 03/14/2016, 9:37 AM

## 2016-03-14 NOTE — Progress Notes (Signed)
Pt admitted to floor and is ambulatory, continent, just voided 300cc's and does not have any skin issues, therefore, Dr. Emmit PomfretHugelmeyer was consulted prior to placing foley and foley order was d/c'd.   Her temp is now 97.8, has received 4.5 liters per ED RN and her last LA was 1.06 so on call MD also ordered to d/c future latic acids.

## 2016-03-15 ENCOUNTER — Other Ambulatory Visit: Payer: Self-pay

## 2016-03-15 DIAGNOSIS — E876 Hypokalemia: Secondary | ICD-10-CM

## 2016-03-15 LAB — BASIC METABOLIC PANEL
Anion gap: 4 — ABNORMAL LOW (ref 5–15)
BUN: 10 mg/dL (ref 6–20)
CALCIUM: 8.6 mg/dL — AB (ref 8.9–10.3)
CO2: 23 mmol/L (ref 22–32)
CREATININE: 0.94 mg/dL (ref 0.44–1.00)
Chloride: 109 mmol/L (ref 101–111)
GFR calc non Af Amer: >60 mL/min (ref >60)
Glucose, Bld: 96 mg/dL (ref 65–99)
Potassium: 4.1 mmol/L (ref 3.5–5.1)
SODIUM: 136 mmol/L (ref 135–145)

## 2016-03-15 LAB — CBC
HCT: 34.3 % — ABNORMAL LOW (ref 36.0–46.0)
HEMOGLOBIN: 10.9 g/dL — AB (ref 12.0–15.0)
MCH: 27.4 pg (ref 26.0–34.0)
MCHC: 31.8 g/dL (ref 30.0–36.0)
MCV: 86.2 fL (ref 78.0–100.0)
PLATELETS: 156 10*3/uL (ref 150–400)
RBC: 3.98 MIL/uL (ref 3.87–5.11)
RDW: 15 % (ref 11.5–15.5)
WBC: 4 10*3/uL (ref 4.0–10.5)

## 2016-03-15 LAB — URINE CULTURE: Culture: <10000 — AB

## 2016-03-15 MED ORDER — PROMETHAZINE HCL 25 MG/ML IJ SOLN
12.5000 mg | INTRAMUSCULAR | Status: DC | PRN
  Administered 2016-03-15 – 2016-03-16 (×4): 12.5 mg via INTRAVENOUS
  Filled 2016-03-15 (×4): qty 1

## 2016-03-15 MED ORDER — DIPHENHYDRAMINE HCL 50 MG/ML IJ SOLN
12.5000 mg | Freq: Once | INTRAMUSCULAR | Status: AC
  Administered 2016-03-15: 12.5 mg via INTRAVENOUS
  Filled 2016-03-15: qty 1

## 2016-03-15 MED ORDER — FAMOTIDINE IN NACL 20-0.9 MG/50ML-% IV SOLN
20.0000 mg | Freq: Two times a day (BID) | INTRAVENOUS | Status: DC
  Administered 2016-03-15 – 2016-03-16 (×3): 20 mg via INTRAVENOUS
  Filled 2016-03-15 (×4): qty 50

## 2016-03-15 NOTE — Progress Notes (Signed)
Pt had a small amount of loose stool with bright red blood mix in. MD notified. Will continue to monitor.

## 2016-03-15 NOTE — Progress Notes (Signed)
PROGRESS NOTE [facility]   Sandra [name]   ZOX:[id number] DOB: 1970/08/04  DOA: 03/13/2016 PCP: No PCP Per Patient   Brief Narrative:  Sandra [name] is a 46 y.o. female with medical history significant of hypertension, hyperlipidemia, GERD, depression, anxiety, OSA not on CPAP, obesity, panic attack, gastroparesis, who presents with nausea, vomiting, suprapubic abdominal pain and left flank pain. Patient was treated with abx last Wednesday with no resolution of the symptoms. Patient admitted for Pyelonephritis and IV abx  Subjective: Patient seen and examined, patient had a bag with clear fluid, patient report is emesis. Patient report bloody stools, but nursing did not witnesses any on the toilet. Patient continue to c/o abdominal pain and vomiting. Patient had an episode of anxiety in AM that was resolved with ativan   Assessment & Plan: Sepsis 2/2 Pyelonephritis- failed outpatient therapy Sepsis physiology resolved - patient hemodynamically stable  Started on Ceftriaxone - will switch to Levaquin  Follow up blood cultures  Urine cultures < 10,000 colonies ? Validity given patient was on oral abx PTA  Advance diet as tolerated   Off Note patient with hx of drug seeking behavior in the past - she continues to ask for Dilaudid "only thing that works" but patient does not look on any distress. No more narcotics - will treat pain with IV Toradol kidney function ok   GERD: Protonix  Gastroparesis: Zofran  Phenergan and Pepcid added  Unable to add reglan - patient get anxiety  Advance diet as tolerated   Hypokalemia: K= 3.0 on admission, low mag - Resolved  Repleted  AKI: Cre 1.18. Likely due to UTI and prerenal secondary to dehydration - resolved  D/c IVF   Depression and anxiety: Stable, no suicidal or homicidal ideations. Continue home medications: Celexa, BuSpar, Remeron prn Ativan  DVT prophylaxis: Lovenox  Code Status: FULL  Family Communication: None  at bedside  Disposition Plan: Anticipate discharge home in 24-48 hrs   Consultants:   None   Procedures:   None   Antimicrobials:  Rocephin 03/13/16  Levaquin 03/14/16   Objective: Vitals:   03/14/16 1449 03/14/16 2008 03/15/16 0526 03/15/16 1045  BP: 114/73 103/70 121/83 (!) 180/105  Pulse: 78 72 86 (!) 105  Resp: 18 18 18 20   Temp: 97.8 F (36.6 C) 98 F (36.7 C) 98.2 F (36.8 C) 98.8 F (37.1 C)  TempSrc: Oral Oral Oral Oral  SpO2: 99% 100% 100% 95%  Weight:      Height:        Intake/Output Summary (Last 24 hours) at 03/15/16 1422 Last data filed at 03/15/16 0100  Gross per 24 hour  Intake             3885 ml  Output             1750 ml  Net             2135 ml   Filed Weights   03/13/16 1838 03/14/16 0116 03/14/16 0130  Weight: 90.7 kg (200 lb) 83.5 kg (184 lb) 83.5 kg (184 lb)    Examination:  General exam: NAD Respiratory system: CTA no wheezing or rales  Cardiovascular system: S1 & S2 heard, RRR. No JVD, murmurs, rubs or gallops Gastrointestinal system: Abdomen soft, non tenderness, positive CVA tenderness.  Central nervous system: Alert and oriented. Extremities: No pedal edema.    Data Reviewed: I have personally reviewed following labs and imaging studies  CBC:  Recent Labs Lab 03/09/16 1908 03/13/16 1803  03/14/16 0625 03/15/16 0517  WBC 11.4* 8.9 5.0 4.0  NEUTROABS  --  6.4  --   --   HGB 14.0 14.2 10.2* 10.9*  HCT 42.3 42.3 31.2* 34.3*  MCV 81.8 81.5 84.6 86.2  PLT 219 257 149* 156   Basic Metabolic Panel:  Recent Labs Lab 03/09/16 1908 03/13/16 1803 03/14/16 0625 03/15/16 0517  NA 139 138 137 136  K 3.0* 3.0* 3.2* 4.1  CL 107 102 106 109  CO2 24 23 25 23   GLUCOSE 183* 123* 96 96  BUN 14 19 16 10   CREATININE 0.94 1.18* 0.81 0.94  CALCIUM 9.5 10.0 7.8* 8.6*  MG  --   --  1.3*  --    GFR: Estimated Creatinine Clearance: 79.8 mL/min (by C-G formula based on SCr of 0.94 mg/dL). Liver Function Tests:  Recent  Labs Lab 03/09/16 1908 03/13/16 1803  AST 18 51*  ALT 34 136*  ALKPHOS 43 47  BILITOT 0.7 1.0  PROT 7.6 8.2*  ALBUMIN 4.6 4.5    Recent Labs Lab 03/09/16 1908 03/13/16 1803  LIPASE 36 37   No results for input(s): AMMONIA in the last 168 hours. Coagulation Profile: No results for input(s): INR, PROTIME in the last 168 hours. Cardiac Enzymes: No results for input(s): CKTOTAL, CKMB, CKMBINDEX, TROPONINI in the last 168 hours. BNP (last 3 results) No results for input(s): PROBNP in the last 8760 hours. HbA1C: No results for input(s): HGBA1C in the last 72 hours. CBG: No results for input(s): GLUCAP in the last 168 hours. Lipid Profile: No results for input(s): CHOL, HDL, LDLCALC, TRIG, CHOLHDL, LDLDIRECT in the last 72 hours. Thyroid Function Tests: No results for input(s): TSH, T4TOTAL, FREET4, T3FREE, THYROIDAB in the last 72 hours. Anemia Panel: No results for input(s): VITAMINB12, FOLATE, FERRITIN, TIBC, IRON, RETICCTPCT in the last 72 hours. Sepsis Labs:  Recent Labs Lab 03/13/16 1803 03/13/16 1832  PROCALCITON <0.10  --   LATICACIDVEN  --  1.06    Recent Results (from the past 240 hour(s))  Urine culture     Status: Abnormal   Collection Time: 03/13/16  4:53 PM  Result Value Ref Range Status   Specimen Description URINE, CATHETERIZED  Final   Special Requests NONE  Final   Culture (A)  Final    <10,000 COLONIES/mL INSIGNIFICANT GROWTH Performed at [facility], [address] [place], [place] [address]    Report Status 03/15/2016 FINAL  Final     Radiology Studies: Dg Chest 2 View  Result Date: 03/13/2016 CLINICAL DATA:  Nausea and vomiting and shortness of Breath EXAM: CHEST  2 VIEW COMPARISON:  01/15/2014 FINDINGS: The heart size and mediastinal contours are within normal limits. Both lungs are clear. The visualized skeletal structures are unremarkable. IMPRESSION: No active cardiopulmonary disease. Electronically Signed   By: Alcide Clever M.D.    On: 03/13/2016 18:30   US Abdomen Complete  Result Date: 03/13/2016 CLINICAL DATA:  Patient with nausea, vomiting and abdominal pain. EXAM: ABDOMEN ULTRASOUND COMPLETE COMPARISON:  CT abdomen pelvis 01/14/2014. FINDINGS: Gallbladder: No gallstones or wall thickening visualized. No sonographic Murphy sign noted by sonographer. Common bile duct: Diameter: 4 mm Liver: Diffusely increased in echogenicity. No focal lesion identified. IVC: No abnormality visualized. Pancreas: Visualized portion unremarkable. Spleen: Size and appearance within normal limits. Right Kidney: Length: 11.6 cm. No hydronephrosis. Renal cortical thinning. Medullary echogenic foci. Left Kidney: Length: 12.0 cm. Renal cortical thinning. No hydronephrosis. Medullary echogenic foci. Abdominal aorta: No aneurysm visualized.  Other findings: None. IMPRESSION: No acute process. No cholelithiasis or sonographic evidence for acute cholecystitis. No hydronephrosis. Findings suggestive of medullary nephrocalcinosis. Electronically Signed   By: Annia Beltrew  Valiente M.D.   On: 03/13/2016 20:40   Ct Renal Stone Study  Result Date: 03/13/2016 CLINICAL DATA:  46 year old female with nausea vomiting. Recent UTI. EXAM: CT ABDOMEN AND PELVIS WITHOUT CONTRAST TECHNIQUE: Multidetector CT imaging of the abdomen and pelvis was performed following the standard protocol without IV contrast. COMPARISON:  Abdominal ultrasound dated 03/13/2016 and CT dated 01/14/2014 FINDINGS: Evaluation of this exam is limited in the absence of intravenous contrast. Lower chest: Minimal bibasilar atelectasis/ scarring. The visualized lung bases are otherwise clear. No intra-abdominal free air or free fluid. Hepatobiliary: Mild diffuse fatty infiltration of the liver. Small amount of layering sludge noted within the gallbladder. No pericholecystic fluid. Pancreas: Unremarkable. No pancreatic ductal dilatation or surrounding inflammatory changes. Spleen: Normal in size without focal  abnormality. Adrenals/Urinary Tract: The adrenal glands appear unremarkable. There is mild bilateral renal atrophy. There is calcifications of the renal medulla bilaterally as seen on the prior studies and most compatible with medullary nephrocalcinosis. There is no hydronephrosis on either side. No obstructing stone identified. The visualized ureters and urinary bladder appear unremarkable. Stomach/Bowel: There is no evidence of bowel obstruction or active inflammation. Normal appendix. Vascular/Lymphatic: The abdominal aorta and IVC are grossly unremarkable on this noncontrast study. No portal venous gas identified. There is no adenopathy. Reproductive: The uterus is anteverted and grossly unremarkable. The ovaries appear unremarkable as well. Other: None Musculoskeletal: No acute or significant osseous findings. IMPRESSION: No acute intra-abdominopelvic pathology. No evidence of bowel obstruction or active inflammation. Normal appendix. Medullary nephrocalcinosis.  No hydronephrosis or obstructing stone. Mild fatty liver and small amount of gallbladder sludge. Electronically Signed   By: Elgie CollardArash  Radparvar M.D.   On: 03/13/2016 23:31    Scheduled Meds: . busPIRone  10 mg Oral TID  . citalopram  20 mg Oral Daily  . enoxaparin (LOVENOX) injection  40 mg Subcutaneous Q24H  . famotidine (PEPCID) IV  20 mg Intravenous Q12H  . Influenza vac split quadrivalent PF  0.5 mL Intramuscular Tomorrow-1000  . levofloxacin (LEVAQUIN) IV  750 mg Intravenous Q24H  . mirtazapine  15 mg Oral QHS  . sodium chloride flush  3 mL Intravenous Q12H  . tiZANidine  4 mg Oral QID  . traZODone  100-400 mg Oral QHS  . zolpidem  5 mg Oral QHS   Continuous Infusions: . sodium chloride 100 mL/hr (03/14/16 2200)     LOS: 1 day    Latrelle DodrillEdwin Silva, MD [facility] Pager [phone]  If 7PM-7AM, please contact night-coverage [email or web address] Password [email or web address] 03/15/2016, 2:22 PM

## 2016-03-15 NOTE — Progress Notes (Signed)
Pt complaining of mid sternal chest tightness. Pt denies fatigue, SOB, radiation of pain, anxiety.  Pt states I can't sop vomiting. Writer noted 100 cc of clear emesis in emesis bag. VS: T 98.8-R20-BP 108/105-O2 sat 95%RA. HR 105. EKG done. MD notified of findings. Will continue to monitor.

## 2016-03-16 DIAGNOSIS — F329 Major depressive disorder, single episode, unspecified: Secondary | ICD-10-CM

## 2016-03-16 LAB — BASIC METABOLIC PANEL
Anion gap: 7 (ref 5–15)
BUN: 6 mg/dL (ref 6–20)
CALCIUM: 8.6 mg/dL — AB (ref 8.9–10.3)
CO2: 27 mmol/L (ref 22–32)
CREATININE: 0.97 mg/dL (ref 0.44–1.00)
Chloride: 103 mmol/L (ref 101–111)
GFR calc Af Amer: >60 mL/min (ref >60)
Glucose, Bld: 96 mg/dL (ref 65–99)
Potassium: 3.7 mmol/L (ref 3.5–5.1)
SODIUM: 137 mmol/L (ref 135–145)

## 2016-03-16 LAB — MAGNESIUM: MAGNESIUM: 1.6 mg/dL — AB (ref 1.7–2.4)

## 2016-03-16 MED ORDER — MAGNESIUM SULFATE 2 GM/50ML IV SOLN
2.0000 g | Freq: Once | INTRAVENOUS | Status: AC
  Administered 2016-03-16: 2 g via INTRAVENOUS
  Filled 2016-03-16: qty 50

## 2016-03-16 MED ORDER — LEVOFLOXACIN 750 MG PO TABS: 750.0000 mg | ORAL_TABLET | Freq: Every day | ORAL | 0 refills | Status: AC

## 2016-03-16 MED ORDER — ONDANSETRON 4 MG PO TBDP: 4.0000 mg | ORAL_TABLET | Freq: Three times a day (TID) | ORAL | 0 refills | Status: DC | PRN

## 2016-03-16 MED ORDER — INFLUENZA VAC SPLIT QUAD 0.5 ML IM SUSY
0.5000 mL | PREFILLED_SYRINGE | INTRAMUSCULAR | Status: AC
  Administered 2016-03-16: 0.5 mL via INTRAMUSCULAR
  Filled 2016-03-16: qty 0.5

## 2016-03-16 MED ORDER — FAMOTIDINE 20 MG PO TABS: 20.0000 mg | ORAL_TABLET | Freq: Two times a day (BID) | ORAL | 0 refills | Status: DC

## 2016-03-16 NOTE — Care Management Note (Signed)
Case Management Note  Patient Details  Name: Sandra [name] MRN: [id number] Date of Birth: Aug 28, 1970  Subjective/Objective: 46 y/o f admitted w/pyelonephritis. From home.                   Action/Plan:d/c home.   Expected Discharge Date:  03/16/16               Expected Discharge Plan:  Home/Self Care  In-House Referral:     Discharge planning Services  CM Consult  Post Acute Care Choice:    Choice offered to:     DME Arranged:    DME Agency:     HH Arranged:    HH Agency:     Status of Service:  Completed, signed off  If discussed at [employer] Length [employer] Stay Meetings, dates discussed:    Additional Comments:  Sandra [name], Sandra Arteaga, RN 03/16/2016, 11:22 AM

## 2016-03-16 NOTE — Discharge Summary (Addendum)
Physician Discharge Summary  Sandra [name]  WUJ:[id number]  DOB: [birth year]  DOA: 03/13/2016 PCP: No PCP Per Patient  Admit date: 03/13/2016 Discharge date: 03/16/2016  Admitted From: Home Disposition:  Home   Recommendations for Outpatient Follow-up:  1. Follow up with PCP in 1-2 weeks 2. Please obtain BMP/CBC in one week 3. Please follow up on the following pending results:  Discharge Condition: Stable  CODE STATUS: FULL  Diet recommendation: Heart Healthy   Brief/Interim Summary: Sandra [name] a 46 y.o.femalewith medical history significant of hypertension, hyperlipidemia, GERD, depression, anxiety, OSA not on CPAP, obesity, panic attack, gastroparesis, who presents with nausea, vomiting, suprapubic abdominal pain and left flank pain. Patient was treated with abx last Wednesday for UTI with no resolution of the symptoms. Patient admitted for Pyelonephritis failing outpatient medication and unable to tolerate oral intake. Patient was treated with IV abx, zofran and phenergan. Patient subsequently improved, tolerating diet and clinically improving. Patient will be discharge home with oral abx to follow up with PCP.   Subjective: Patient seen and examined at bedside. Doing well, still with mild abdominal pain. Having breakfast. Remains afebrile. Denies dysuria or hematuria.   Discharge Diagnoses/Hospital Course:  Sepsis 2/2 Pyelonephritis- failed outpatient therapy Sepsis physiology resolved - patient hemodynamically stable, afebrile and tolerating diet  Initially treated with Ceftriaxone - switched to IV Levaquin - clinically improving  Continue PO Levaquin for 5 more days Follow up final blood cultures no growth UTD Urine cultures < 10,000 colonies ? Validity given patient was on oral abx PTA  Follow up with PCP    GERD: Protonix switched to Pepcid 20 mg BID   Gastroparesis: Continue Zofran disintegrating tablets given   Continue Phenergan and Pepcid  Unable to  add reglan - patient get anxiety  Follow up with PCP   Hypokalemia: K= 3.0on admission, low mag - Resolved  Repleted  AKI: Cre 1.18. Likely due to UTI and prerenal secondary to dehydration - resolved  D/c IVF   Depression and anxiety:Stable, no suicidal or homicidal ideations. Continue home medications: Celexa, BuSpar, Remeron  Discharge Instructions  You were cared for by a hospitalist during your hospital stay. If you have any questions about your discharge medications or the care you received while you were in the hospital after you are discharged, you can call the unit and asked to speak with the hospitalist on call if the hospitalist that took care of you is not available. Once you are discharged, your primary care physician will handle any further medical issues. Please note that NO REFILLS for any discharge medications will be authorized once you are discharged, as it is imperative that you return to your primary care physician (or establish a relationship with a primary care physician if you do not have one) for your aftercare needs so that they can reassess your need for medications and monitor your lab values.  Discharge Instructions    Call MD for:  difficulty breathing, headache or visual disturbances    Complete by:  As directed    Call MD for:  extreme fatigue    Complete by:  As directed    Call MD for:  hives    Complete by:  As directed    Call MD for:  persistant dizziness or light-headedness    Complete by:  As directed    Call MD for:  persistant nausea and vomiting    Complete by:  As directed    Call MD for:  redness, tenderness, or  signs of infection (pain, swelling, redness, odor or green/yellow discharge around incision site)    Complete by:  As directed    Call MD for:  severe uncontrolled pain    Complete by:  As directed    Call MD for:  temperature >100.4    Complete by:  As directed    Diet - low sodium heart healthy    Complete by:  As directed     Increase activity slowly    Complete by:  As directed      Allergies as of 03/16/2016      Reactions   Iodides Hives   Imitrex [sumatriptan]    Pt states she takes imitrex injection-"sometimes it works and sometimes doesn't"   Toradol [ketorolac Tromethamine] Hives   Metoclopramide Hcl Anxiety      Medication List    STOP taking these medications   cephALEXin 500 MG capsule Commonly known as:  KEFLEX   clindamycin 150 MG capsule Commonly known as:  CLEOCIN   hydrOXYzine 25 MG tablet Commonly known as:  ATARAX/VISTARIL   potassium chloride SA 20 MEQ tablet Commonly known as:  K-DUR,KLOR-CON     TAKE these medications   busPIRone 10 MG tablet Commonly known as:  BUSPAR Take 10 mg by mouth 3 (three) times daily.   BUTRANS 20 MCG/HR Ptwk patch Generic drug:  buprenorphine Place 20 mcg onto the skin once a week. When needed for pain or addiction to opiods   citalopram 20 MG tablet Commonly known as:  CELEXA Take 20 mg by mouth daily.   Dexlansoprazole 30 MG capsule Take 1 capsule (30 mg total) by mouth daily. What changed:  when to take this   famotidine 20 MG tablet Commonly known as:  PEPCID Take 1 tablet (20 mg total) by mouth 2 (two) times daily.   levofloxacin 750 MG tablet Commonly known as:  LEVAQUIN Take 1 tablet (750 mg total) by mouth daily.   mirtazapine 15 MG tablet Commonly known as:  REMERON Take 15 mg by mouth at bedtime.   NUCYNTA 100 MG Tabs Generic drug:  Tapentadol HCl Take 100 mg by mouth 4 (four) times daily as needed (pain).   ondansetron 4 MG disintegrating tablet Commonly known as:  ZOFRAN ODT Take 1 tablet (4 mg total) by mouth every 8 (eight) hours as needed for nausea or vomiting.   pregabalin 300 MG capsule Commonly known as:  LYRICA Take 300 mg by mouth 3 (three) times daily as needed (pain).   promethazine 25 MG tablet Commonly known as:  PHENERGAN Take 1 tablet (25 mg total) by mouth every 6 (six) hours as needed for  nausea or vomiting.   tiZANidine 4 MG tablet Commonly known as:  ZANAFLEX Take 4 mg by mouth 4 (four) times daily.   traZODone 100 MG tablet Commonly known as:  DESYREL Take 100-400 mg by mouth at bedtime. And if needed Can take up to 400mg  at bedtime for sleep   zolpidem 10 MG tablet Commonly known as:  AMBIEN Take 10 mg by mouth at bedtime.       Allergies  Allergen Reactions  . Iodides Hives  . Imitrex [Sumatriptan]     Pt states she takes imitrex injection-"sometimes it works and sometimes doesn't"  . Toradol [Ketorolac Tromethamine] Hives  . Metoclopramide Hcl Anxiety    Consultations:  None  Procedures/Studies: Dg Chest 2 View  Result Date: 03/13/2016 CLINICAL DATA:  Nausea and vomiting and shortness of Breath EXAM: CHEST  2  VIEW COMPARISON:  01/15/2014 FINDINGS: The heart size and mediastinal contours are within normal limits. Both lungs are clear. The visualized skeletal structures are unremarkable. IMPRESSION: No active cardiopulmonary disease. Electronically Signed   By: Alcide CleverMark  Lukens M.D.   On: 03/13/2016 18:30   Dg Elbow Complete Right  Result Date: 03/09/2016 CLINICAL DATA:  Status post fall, with right posterior elbow pain. Initial encounter. EXAM: RIGHT ELBOW - COMPLETE 3+ VIEW COMPARISON:  None. FINDINGS: There is no evidence of fracture or dislocation. The visualized joint spaces are preserved. No significant joint effusion is identified. The soft tissues are unremarkable in appearance. IMPRESSION: No evidence of fracture or dislocation. Electronically Signed   By: Roanna RaiderJeffery  Chang M.D.   On: 03/09/2016 20:45   [place] Abdomen Complete  Result Date: 03/13/2016 CLINICAL DATA:  Patient with nausea, vomiting and abdominal pain. EXAM: ABDOMEN ULTRASOUND COMPLETE COMPARISON:  CT abdomen pelvis 01/14/2014. FINDINGS: Gallbladder: No gallstones or wall thickening visualized. No sonographic Murphy sign noted by sonographer. Common bile duct: Diameter: 4 mm Liver: Diffusely  increased in echogenicity. No focal lesion identified. IVC: No abnormality visualized. Pancreas: Visualized portion unremarkable. Spleen: Size and appearance within normal limits. Right Kidney: Length: 11.6 cm. No hydronephrosis. Renal cortical thinning. Medullary echogenic foci. Left Kidney: Length: 12.0 cm. Renal cortical thinning. No hydronephrosis. Medullary echogenic foci. Abdominal aorta: No aneurysm visualized. Other findings: None. IMPRESSION: No acute process. No cholelithiasis or sonographic evidence for acute cholecystitis. No hydronephrosis. Findings suggestive of medullary nephrocalcinosis. Electronically Signed   By: Annia Beltrew  Colgan M.D.   On: 03/13/2016 20:40   Ct Renal Stone Study  Result Date: 03/13/2016 CLINICAL DATA:  46 year old female with nausea vomiting. Recent UTI. EXAM: CT ABDOMEN AND PELVIS WITHOUT CONTRAST TECHNIQUE: Multidetector CT imaging of the abdomen and pelvis was performed following the standard protocol without IV contrast. COMPARISON:  Abdominal ultrasound dated 03/13/2016 and CT dated 01/14/2014 FINDINGS: Evaluation of this exam is limited in the absence of intravenous contrast. Lower chest: Minimal bibasilar atelectasis/ scarring. The visualized lung bases are otherwise clear. No intra-abdominal free air or free fluid. Hepatobiliary: Mild diffuse fatty infiltration of the liver. Small amount of layering sludge noted within the gallbladder. No pericholecystic fluid. Pancreas: Unremarkable. No pancreatic ductal dilatation or surrounding inflammatory changes. Spleen: Normal in size without focal abnormality. Adrenals/Urinary Tract: The adrenal glands appear unremarkable. There is mild bilateral renal atrophy. There is calcifications of the renal medulla bilaterally as seen on the prior studies and most compatible with medullary nephrocalcinosis. There is no hydronephrosis on either side. No obstructing stone identified. The visualized ureters and urinary bladder appear  unremarkable. Stomach/Bowel: There is no evidence of bowel obstruction or active inflammation. Normal appendix. Vascular/Lymphatic: The abdominal aorta and IVC are grossly unremarkable on this noncontrast study. No portal venous gas identified. There is no adenopathy. Reproductive: The uterus is anteverted and grossly unremarkable. The ovaries appear unremarkable as well. Other: None Musculoskeletal: No acute or significant osseous findings. IMPRESSION: No acute intra-abdominopelvic pathology. No evidence of bowel obstruction or active inflammation. Normal appendix. Medullary nephrocalcinosis.  No hydronephrosis or obstructing stone. Mild fatty liver and small amount of gallbladder sludge. Electronically Signed   By: Elgie CollardArash  Radparvar M.D.   On: 03/13/2016 23:31     Discharge Exam: Vitals:   03/15/16 2020 03/16/16 0549  BP: 109/77 112/71  Pulse: 88 76  Resp: 18 16  Temp: 97.5 F (36.4 C) 98.1 F (36.7 C)   Vitals:   03/15/16 1045 03/15/16 1519 03/15/16 2020 03/16/16 0549  BP: Marland Kitchen(!)  180/105 (!) 175/108 109/77 112/71  Pulse: (!) 105 (!) 112 88 76  Resp: 20 18 18 16   Temp: 98.8 F (37.1 C) 98.5 F (36.9 C) 97.5 F (36.4 C) 98.1 F (36.7 C)  TempSrc: Oral Oral Oral Oral  SpO2: 95% 100% 100% 98%  Weight:      Height:        General: Pt is alert, awake, not in acute distress Cardiovascular: RRR, S1/S2 +, no rubs, no gallops Respiratory: CTA bilaterally, no wheezing, no rhonchi Abdominal: Soft, NT, ND, bowel sounds + Extremities: no edema, no cyanosis   The results of significant diagnostics from this hospitalization (including imaging, microbiology, ancillary and laboratory) are listed below for reference.     Microbiology: Recent Results (from the past 240 hour(s))  Urine culture     Status: Abnormal   Collection Time: 03/13/16  4:53 PM  Result Value Ref Range Status   Specimen Description URINE, CATHETERIZED  Final   Special Requests NONE  Final   Culture (A)  Final     <10,000 COLONIES/mL INSIGNIFICANT GROWTH Performed at [facility], [address], [place], [place] [address]    Report Status 03/15/2016 FINAL  Final  Blood culture (routine x 2)     Status: None (Preliminary result)   Collection Time: 03/13/16  6:00 PM  Result Value Ref Range Status   Specimen Description BLOOD RIGHT HAND  Final   Special Requests BOTTLES DRAWN AEROBIC AND ANAEROBIC 5CC  Final   Culture   Final    NO GROWTH 1 DAY Performed at [facility], [address], [place], [place] [address]    Report Status PENDING  Incomplete  Blood culture (routine x 2)     Status: None (Preliminary result)   Collection Time: 03/13/16  6:05 PM  Result Value Ref Range Status   Specimen Description BLOOD LEFT HAND  Final   Special Requests BOTTLES DRAWN AEROBIC AND ANAEROBIC 5CC  Final   Culture   Final    NO GROWTH 1 DAY Performed at [facility], [address] [place], [place] [address]    Report Status PENDING  Incomplete     Labs: BNP (last 3 results) No results for input(s): BNP in the last 8760 hours. Basic Metabolic Panel:  Recent Labs Lab 03/09/16 1908 03/13/16 1803 03/14/16 0625 03/15/16 0517 03/16/16 0525  NA 139 138 137 136 137  K 3.0* 3.0* 3.2* 4.1 3.7  CL 107 102 106 109 103  CO2 24 23 25 23 27   GLUCOSE 183* 123* 96 96 96  BUN 14 19 16 10 6   CREATININE 0.94 1.18* 0.81 0.94 0.97  CALCIUM 9.5 10.0 7.8* 8.6* 8.6*  MG  --   --  1.3*  --  1.6*   Liver Function Tests:  Recent Labs Lab 03/09/16 1908 03/13/16 1803  AST 18 51*  ALT 34 136*  ALKPHOS 43 47  BILITOT 0.7 1.0  PROT 7.6 8.2*  ALBUMIN 4.6 4.5    Recent Labs Lab 03/09/16 1908 03/13/16 1803  LIPASE 36 37   No results for input(s): AMMONIA in the last 168 hours. CBC:  Recent Labs Lab 03/09/16 1908 03/13/16 1803 03/14/16 0625 03/15/16 0517  WBC 11.4* 8.9 5.0 4.0  NEUTROABS  --  6.4  --   --   HGB 14.0 14.2 10.2* 10.9*  HCT 42.3 42.3 31.2* 34.3*  MCV 81.8 81.5  84.6 86.2  PLT 219 257 149* 156   Cardiac  Enzymes: No results for input(s): CKTOTAL, CKMB, CKMBINDEX, TROPONINI in the last 168 hours. BNP: Invalid input(s): POCBNP CBG: No results for input(s): GLUCAP in the last 168 hours. D-Dimer No results for input(s): DDIMER in the last 72 hours. Hgb A1c No results for input(s): HGBA1C in the last 72 hours. Lipid Profile No results for input(s): CHOL, HDL, LDLCALC, TRIG, CHOLHDL, LDLDIRECT in the last 72 hours. Thyroid function studies No results for input(s): TSH, T4TOTAL, T3FREE, THYROIDAB in the last 72 hours.  Invalid input(s): FREET3 Anemia work up No results for input(s): VITAMINB12, FOLATE, FERRITIN, TIBC, IRON, RETICCTPCT in the last 72 hours. Urinalysis    Component Value Date/Time   COLORURINE AMBER (A) 03/13/2016 1653   APPEARANCEUR CLOUDY (A) 03/13/2016 1653   LABSPEC 1.027 03/13/2016 1653   PHURINE 5.0 03/13/2016 1653   GLUCOSEU NEGATIVE 03/13/2016 1653   HGBUR NEGATIVE 03/13/2016 1653   BILIRUBINUR SMALL (A) 03/13/2016 1653   KETONESUR 5 (A) 03/13/2016 1653   PROTEINUR 100 (A) 03/13/2016 1653   UROBILINOGEN 0.2 04/10/2014 2229   NITRITE NEGATIVE 03/13/2016 1653   LEUKOCYTESUR MODERATE (A) 03/13/2016 1653   Sepsis Labs Invalid input(s): PROCALCITONIN,  WBC,  LACTICIDVEN Microbiology Recent Results (from the past 240 hour(s))  Urine culture     Status: Abnormal   Collection Time: 03/13/16  4:53 PM  Result Value Ref Range Status   Specimen Description URINE, CATHETERIZED  Final   Special Requests NONE  Final   Culture (A)  Final    <10,000 COLONIES/mL INSIGNIFICANT GROWTH Performed at [facility], [address], [place], [place] [address]    Report Status 03/15/2016 FINAL  Final  Blood culture (routine x 2)     Status: None (Preliminary result)   Collection Time: 03/13/16  6:00 PM  Result Value Ref Range Status   Specimen Description BLOOD RIGHT HAND  Final   Special Requests BOTTLES DRAWN AEROBIC AND  ANAEROBIC 5CC  Final   Culture   Final    NO GROWTH 1 DAY Performed at [facility], [address] [place], [place] [address]    Report Status PENDING  Incomplete  Blood culture (routine x 2)     Status: None (Preliminary result)   Collection Time: 03/13/16  6:05 PM  Result Value Ref Range Status   Specimen Description BLOOD LEFT HAND  Final   Special Requests BOTTLES DRAWN AEROBIC AND ANAEROBIC 5CC  Final   Culture   Final    NO GROWTH 1 DAY Performed at [facility], [address] [place], [place] [address]    Report Status PENDING  Incomplete    Time coordinating discharge: Over 42 minutes  SIGNED:  Latrelle Dodrill, MD  [facility] 03/16/2016, 11:15 AM Pager   If 7PM-7AM, please contact night-coverage [email or web address] Password [email or web address]

## 2016-03-16 NOTE — Progress Notes (Addendum)
Went over all discharge paperwork with patient.  Explained importance of taking medications as prescribed.  VSS.  Pt tolerating diet.  PT stated she wants to walk out and does not need wheelchair.

## 2016-03-19 LAB — CULTURE, BLOOD (ROUTINE X 2)
CULTURE: NO GROWTH
CULTURE: NO GROWTH

## 2016-03-24 ENCOUNTER — Encounter (HOSPITAL_COMMUNITY): Payer: Self-pay | Admitting: Emergency Medicine

## 2016-03-24 ENCOUNTER — Emergency Department (HOSPITAL_COMMUNITY): Payer: Medicaid Other

## 2016-03-24 ENCOUNTER — Emergency Department (HOSPITAL_COMMUNITY)
Admission: EM | Admit: 2016-03-24 | Discharge: 2016-03-24 | Disposition: A | Discharge status: Home / Self Care | Payer: Medicaid Other | Attending: Emergency Medicine | Admitting: Emergency Medicine

## 2016-03-24 DIAGNOSIS — R112 Nausea with vomiting, unspecified: Secondary | ICD-10-CM | POA: Diagnosis not present

## 2016-03-24 DIAGNOSIS — R1084 Generalized abdominal pain: Secondary | ICD-10-CM | POA: Diagnosis not present

## 2016-03-24 DIAGNOSIS — F419 Anxiety disorder, unspecified: Secondary | ICD-10-CM | POA: Diagnosis not present

## 2016-03-24 DIAGNOSIS — Z79899 Other long term (current) drug therapy: Secondary | ICD-10-CM | POA: Diagnosis not present

## 2016-03-24 DIAGNOSIS — I10 Essential (primary) hypertension: Secondary | ICD-10-CM | POA: Insufficient documentation

## 2016-03-24 DIAGNOSIS — R197 Diarrhea, unspecified: Secondary | ICD-10-CM

## 2016-03-24 LAB — COMPREHENSIVE METABOLIC PANEL
ALBUMIN: 3.8 g/dL (ref 3.5–5.0)
ALT: 25 U/L (ref 14–54)
AST: 14 U/L — AB (ref 15–41)
Alkaline Phosphatase: 30 U/L — ABNORMAL LOW (ref 38–126)
Anion gap: 8 (ref 5–15)
BUN: 6 mg/dL (ref 6–20)
CHLORIDE: 104 mmol/L (ref 101–111)
CO2: 29 mmol/L (ref 22–32)
Calcium: 9.1 mg/dL (ref 8.9–10.3)
Creatinine, Ser: 1.03 mg/dL — ABNORMAL HIGH (ref 0.44–1.00)
GFR calc Af Amer: >60 mL/min (ref >60)
Glucose, Bld: 110 mg/dL — ABNORMAL HIGH (ref 65–99)
Potassium: 2.7 mmol/L — CL (ref 3.5–5.1)
SODIUM: 141 mmol/L (ref 135–145)
Total Bilirubin: 0.4 mg/dL (ref 0.3–1.2)
Total Protein: 6.5 g/dL (ref 6.5–8.1)

## 2016-03-24 LAB — URINALYSIS, ROUTINE W REFLEX MICROSCOPIC
Bilirubin Urine: NEGATIVE
GLUCOSE, UA: NEGATIVE mg/dL
HGB URINE DIPSTICK: NEGATIVE
Ketones, ur: NEGATIVE mg/dL
Leukocytes, UA: NEGATIVE
Nitrite: NEGATIVE
Protein, ur: NEGATIVE mg/dL
SPECIFIC GRAVITY, URINE: 1.019 (ref 1.005–1.030)
pH: 6 (ref 5.0–8.0)

## 2016-03-24 LAB — RAPID URINE DRUG SCREEN, HOSP PERFORMED
Amphetamines: NOT DETECTED
BARBITURATES: NOT DETECTED
BENZODIAZEPINES: NOT DETECTED
COCAINE: NOT DETECTED
Opiates: NOT DETECTED

## 2016-03-24 LAB — I-STAT TROPONIN, ED: TROPONIN I, POC: 0 ng/mL (ref 0.00–0.08)

## 2016-03-24 LAB — I-STAT BETA HCG BLOOD, ED (MC, WL, AP ONLY)

## 2016-03-24 MED ORDER — SODIUM CHLORIDE 0.9 % IV BOLUS (SEPSIS)
1000.0000 mL | Freq: Once | INTRAVENOUS | Status: AC
  Administered 2016-03-24: 1000 mL via INTRAVENOUS

## 2016-03-24 MED ORDER — HYDROMORPHONE HCL 1 MG/ML IJ SOLN
1.0000 mg | Freq: Once | INTRAMUSCULAR | Status: AC
  Administered 2016-03-24: 1 mg via INTRAVENOUS
  Filled 2016-03-24: qty 1

## 2016-03-24 MED ORDER — PROMETHAZINE HCL 25 MG RE SUPP: 25.0000 mg | Freq: Four times a day (QID) | RECTAL | 0 refills | Status: DC | PRN

## 2016-03-24 MED ORDER — POTASSIUM CHLORIDE CRYS ER 20 MEQ PO TBCR
40.0000 meq | EXTENDED_RELEASE_TABLET | Freq: Once | ORAL | Status: AC
  Administered 2016-03-24: 40 meq via ORAL
  Filled 2016-03-24: qty 2

## 2016-03-24 MED ORDER — POTASSIUM CHLORIDE CRYS ER 20 MEQ PO TBCR: 40.0000 meq | EXTENDED_RELEASE_TABLET | Freq: Every day | ORAL | 0 refills | Status: DC

## 2016-03-24 MED ORDER — ONDANSETRON HCL 4 MG/2ML IJ SOLN
4.0000 mg | Freq: Once | INTRAMUSCULAR | Status: AC
  Administered 2016-03-24: 4 mg via INTRAVENOUS
  Filled 2016-03-24: qty 2

## 2016-03-24 MED ORDER — PROMETHAZINE HCL 25 MG RE SUPP
25.0000 mg | Freq: Once | RECTAL | Status: AC
  Administered 2016-03-24: 25 mg via RECTAL
  Filled 2016-03-24: qty 1

## 2016-03-24 NOTE — ED Notes (Signed)
Bed: WA07 Expected date:  Expected time:  Means of arrival:  Comments: 

## 2016-03-24 NOTE — ED Triage Notes (Addendum)
Patient is complaining of left flank pain. Patient states that she thinks she has a kidney infection. Patient also states she is having nausea, abdominal pain, and vomiting. Patient states she this has been going on since March 09, 2016. After finishing triaging patient, patient starts to say her heart is beating out of her chest.

## 2016-03-24 NOTE — ED Provider Notes (Signed)
[facility]-EMERGENCY DEPT Provider Note   CSN: [id number] Arrival date & time: 03/24/16  0325     History   Chief Complaint Chief Complaint  Patient presents with  . Nausea  . Emesis  . Flank Pain  . Chest Pain    HPI Pinkey K [name] is a 46 y.o. female.  The history is provided by the patient.  Flank Pain  This is a recurrent problem. Episode onset: 2 weeks. The problem occurs constantly. The problem has not changed since onset.Associated symptoms include chest pain. Nothing aggravates the symptoms. Nothing relieves the symptoms. Treatments tried: nsaids. The treatment provided no relief.  Chest Pain   This is a recurrent problem. The current episode started 6 to 12 hours ago. The problem occurs constantly. The problem has not changed since onset.The pain is present in the substernal region. The pain is moderate. The quality of the pain is described as pressure-like. The pain does not radiate. Associated symptoms include vomiting. Pertinent negatives include no fever.   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   Of note patient reports history of chronic pain that is managed by pain clinic with chronic narcotics of which she has been out of for 2 weeks.  Past Medical History:  Diagnosis Date  . Anemia    a. Mild with Hgb 11.2, 09/2012. UA with mod Hgb. Instructed to f/u PCP.  Marland Kitchen Anxiety   . Anxiety   . Atrial fibrillation with RVR (HCC)    a. Dx 09/2012, spont conv to NSR.  Marland Kitchen Chronic lower back pain   . Depression   . Gastroparesis   . GERD (gastroesophageal reflux disease)   . Gestational diabetes   . H/O echocardiogram    a. 02/2010: done for htn/tachy while pregnant - EF 55-60%, grade 1 d/dysf, mildly dilated LA.  . High cholesterol   . Hypertension   . Kidney stones   . Migraines   . Obesity   . Panic attack   . Restless legs   . Sleep apnea     Patient Active Problem List   Diagnosis Date Noted  . Sepsis  (HCC) 03/13/2016  . Pyelonephritis 03/13/2016  . Depression 03/13/2016  . AKI (acute kidney injury) (HCC) 03/13/2016  . Hypokalemia 03/13/2016  . Severe recurrent major depression without psychotic features (HCC) 11/15/2014    Class: Chronic  . Major depressive disorder, single episode, severe without psychosis (HCC) 11/15/2014  . Altered mental status 01/15/2014  . Acute encephalopathy 02/13/2013  . Acute respiratory failure (HCC) 02/13/2013  . Aspiration pneumonia (HCC) 02/13/2013  . Lumbar spondylosis 04/21/2011  . Trochanteric bursitis 04/21/2011  . Myofascial pain 04/21/2011  . TACHYCARDIA 03/06/2010  . DYSPNEA 01/06/2010  . RESTLESS LEG SYNDROME 12/11/2009  . MIGRAINE HEADACHE 12/11/2009  . GERD (gastroesophageal reflux disease) 12/11/2009  . Gastroparesis 12/11/2009  . DIABETES MELLITUS, GESTATIONAL, HX OF 12/11/2009    Past Surgical History:  Procedure Laterality Date  . BACK SURGERY    . CESAREAN SECTION  2012  . CYSTOSCOPY W/ URETERAL STENT PLACEMENT  2006  . CYSTOSCOPY/RETROGRADE/URETEROSCOPY/STONE EXTRACTION WITH BASKET  1987  . DILATION AND CURETTAGE OF UTERUS  2006; 2010  . TUBAL LIGATION  2012    OB History    No data available       Home Medications    Prior to Admission medications   Medication Sig Start Date End Date Taking? Authorizing Provider  busPIRone (BUSPAR) 10 MG tablet Take 10 mg by  mouth 3 (three) times daily.    Yes Historical Provider, MD  citalopram (CELEXA) 20 MG tablet Take 20 mg by mouth daily.   Yes Historical Provider, MD  Dexlansoprazole 30 MG capsule Take 1 capsule (30 mg total) by mouth daily. Patient taking differently: Take 30 mg by mouth 2 (two) times daily.  08/16/15  Yes Nicole Pisciotta, PA-C  famotidine (PEPCID) 20 MG tablet Take 1 tablet (20 mg total) by mouth 2 (two) times daily. 03/16/16  Yes Lenox Ponds, MD  mirtazapine (REMERON) 15 MG tablet Take 15 mg by mouth at bedtime.   Yes Historical Provider, MD  Multiple  Vitamin (MULTIVITAMIN WITH MINERALS) TABS tablet Take 1 tablet by mouth daily.   Yes Historical Provider, MD  pregabalin (LYRICA) 300 MG capsule Take 300 mg by mouth 3 (three) times daily as needed (pain).    Yes Historical Provider, MD  Tapentadol HCl (NUCYNTA) 100 MG TABS Take 100 mg by mouth 4 (four) times daily as needed (pain).    Yes Historical Provider, MD  tiZANidine (ZANAFLEX) 4 MG tablet Take 4 mg by mouth 4 (four) times daily.   Yes Historical Provider, MD  traZODone (DESYREL) 100 MG tablet Take 100-400 mg by mouth at bedtime. And if needed Can take up to 400mg  at bedtime for sleep   Yes Historical Provider, MD  zolpidem (AMBIEN) 10 MG tablet Take 10 mg by mouth at bedtime.   Yes Historical Provider, MD  ondansetron (ZOFRAN ODT) 4 MG disintegrating tablet Take 1 tablet (4 mg total) by mouth every 8 (eight) hours as needed for nausea or vomiting. Patient not taking: Reported on 03/24/2016 03/16/16   Lenox Ponds, MD  potassium chloride SA (K-DUR,KLOR-CON) 20 MEQ tablet Take 2 tablets (40 mEq total) by mouth daily. 03/24/16 03/31/16  Nira Conn, MD  promethazine (PHENERGAN) 25 MG suppository Place 1 suppository (25 mg total) rectally every 6 (six) hours as needed for nausea or vomiting. 03/24/16   Nira Conn, MD    Family History Family History  Problem Relation Age of Onset  . Diabetes Mother   . Hypertension Mother   . Migraines Father   . Migraines Brother     Social History Social History  Substance Use Topics  . Smoking status: Never Smoker  . Smokeless tobacco: Never Used  . Alcohol use No     Allergies   Iodides; Imitrex [sumatriptan]; Toradol [ketorolac tromethamine]; and Metoclopramide hcl   Review of Systems Review of Systems  Constitutional: Negative for fever.  Cardiovascular: Positive for chest pain.  Gastrointestinal: Positive for vomiting.  Genitourinary: Positive for dysuria, flank pain, frequency and urgency.  Ten systems are  reviewed and are negative for acute change except as noted in the HPI    Physical Exam Updated Vital Signs BP (!) 172/126 (BP Location: Left Arm)   Pulse 115   Temp 98.3 F (36.8 C) (Oral)   Resp 19   Ht 5\' 4"  (1.626 m)   Wt 202 lb (91.6 kg)   LMP 03/04/2016   SpO2 96%   BMI 34.67 kg/m   Physical Exam  Constitutional: She is oriented to person, place, and time. She appears well-developed and well-nourished. No distress.  HENT:  Head: Normocephalic and atraumatic.  Nose: Nose normal.  Eyes: Conjunctivae and EOM are normal. Pupils are equal, round, and reactive to light. Right eye exhibits no discharge. Left eye exhibits no discharge. No scleral icterus.  Neck: Normal range of motion. Neck supple.  Cardiovascular:  Normal rate and regular rhythm.  Exam reveals no gallop and no friction rub.   No murmur heard. Pulmonary/Chest: Effort normal and breath sounds normal. No stridor. No respiratory distress. She has no rales.  Abdominal: Soft. She exhibits no distension. There is no tenderness. There is CVA tenderness (bilateral).  Musculoskeletal: She exhibits no edema or tenderness.  Neurological: She is alert and oriented to person, place, and time.  Skin: Skin is warm and dry. No rash noted. She is not diaphoretic. No erythema.  Psychiatric: She has a normal mood and affect.  Vitals reviewed.    ED Treatments / Results  Labs (all labs ordered are listed, but only abnormal results are displayed) Labs Reviewed  COMPREHENSIVE METABOLIC PANEL - Abnormal; Notable for the following:       Result Value   Potassium 2.7 (*)    Glucose, Bld 110 (*)    Creatinine, Ser 1.03 (*)    AST 14 (*)    Alkaline Phosphatase 30 (*)    All other components within normal limits  RAPID URINE DRUG SCREEN, HOSP PERFORMED - Abnormal; Notable for the following:    Tetrahydrocannabinol RESULTS UNAVAILABLE DUE TO INTERFERING SUBSTANCE (*)    All other components within normal limits  URINALYSIS,  ROUTINE W REFLEX MICROSCOPIC - Abnormal; Notable for the following:    APPearance HAZY (*)    All other components within normal limits  CBC  I-STAT TROPOININ, ED  I-STAT BETA HCG BLOOD, ED ([facility], [facility], [facility] ONLY)    EKG  EKG Interpretation  Date/Time:  Wednesday March 24 2016 04:04:07 EST Ventricular Rate:  103 PR Interval:    QRS Duration: 81 QT Interval:  337 QTC Calculation: 442 R Axis:   -5 Text Interpretation:  Sinus tachycardia T wave abnormality No significant change since last tracing Confirmed by Bebe Shaggy  MD, DONALD ([id number]) on 03/24/2016 4:13:31 AM       Radiology Dg Chest 2 View  Result Date: 03/24/2016 CLINICAL DATA:  46 y/o  F; chest pain. EXAM: CHEST  2 VIEW COMPARISON:  03/13/2016 chest radiograph FINDINGS: Stable heart size and mediastinal contours are within normal limits. Both lungs are clear. The visualized skeletal structures are unremarkable. IMPRESSION: No active cardiopulmonary disease. Electronically Signed   By: Mitzi Hansen M.D.   On: 03/24/2016 04:25    Procedures Procedures (including critical care time)  Medications Ordered in ED Medications  sodium chloride 0.9 % bolus 1,000 mL (0 mLs Intravenous Stopped 03/24/16 0841)  ondansetron (ZOFRAN) injection 4 mg (4 mg Intravenous Given 03/24/16 0747)  HYDROmorphone (DILAUDID) injection 1 mg (1 mg Intravenous Given 03/24/16 0747)  potassium chloride SA (K-DUR,KLOR-CON) CR tablet 40 mEq (40 mEq Oral Given 03/24/16 0754)  promethazine (PHENERGAN) suppository 25 mg (25 mg Rectal Given 03/24/16 0957)     Initial Impression / Assessment and Plan / ED Course  I have reviewed the triage vital signs and the nursing notes.  Pertinent labs & imaging results that were available during my care of the patient were reviewed by me and considered in my medical decision making (see chart for details).     Labs grossly reassuring. Low potassium noted and repleted orally. UA without evidence of infection. She is  afebrile with stable vital signs, well-appearing, well-hydrated, nontoxic. Abdominal exam nonperitonitic. Do not feel that advanced imaging is required at this time. Patient provided with Phenergan suppository and was able to tolerate by mouth intake.  Patient was instructed to follow-up with her chronic pain physician for continued  pain management. Instructed to establish care and follow-up with primary care provider.   The patient is safe for discharge with strict return precautions.  Final Clinical Impressions(s) / ED Diagnoses   Final diagnoses:  Nausea vomiting and diarrhea  Generalized abdominal pain  Anxiety   Disposition: Discharge  Condition: Good  I have discussed the results, Dx and Tx plan with the patient who expressed understanding and agree(s) with the plan. Discharge instructions discussed at great length. The patient was given strict return precautions who verbalized understanding of the instructions. No further questions at time of discharge.    New Prescriptions   POTASSIUM CHLORIDE SA (K-DUR,KLOR-CON) 20 MEQ TABLET    Take 2 tablets (40 mEq total) by mouth daily.   PROMETHAZINE (PHENERGAN) 25 MG SUPPOSITORY    Place 1 suppository (25 mg total) rectally every 6 (six) hours as needed for nausea or vomiting.    Follow Up: primary care provider  Call  For help establishing care with a care provider  [facility] [place] [address] [place] [id number] [phone] Call  As needed      Nira ConnPedro Eduardo Juniper Snyders, MD 03/24/16 1144

## 2016-05-06 ENCOUNTER — Emergency Department (HOSPITAL_COMMUNITY)
Admission: EM | Admit: 2016-05-06 | Discharge: 2016-05-06 | Disposition: A | Discharge status: Home / Self Care | Payer: Medicaid Other | Attending: Emergency Medicine | Admitting: Emergency Medicine

## 2016-05-06 DIAGNOSIS — I159 Secondary hypertension, unspecified: Secondary | ICD-10-CM | POA: Diagnosis not present

## 2016-05-06 DIAGNOSIS — Y929 Unspecified place or not applicable: Secondary | ICD-10-CM | POA: Insufficient documentation

## 2016-05-06 DIAGNOSIS — Y999 Unspecified external cause status: Secondary | ICD-10-CM | POA: Insufficient documentation

## 2016-05-06 DIAGNOSIS — E119 Type 2 diabetes mellitus without complications: Secondary | ICD-10-CM | POA: Diagnosis not present

## 2016-05-06 DIAGNOSIS — Y939 Activity, unspecified: Secondary | ICD-10-CM | POA: Insufficient documentation

## 2016-05-06 DIAGNOSIS — I493 Ventricular premature depolarization: Secondary | ICD-10-CM | POA: Insufficient documentation

## 2016-05-06 DIAGNOSIS — T07XXXA Unspecified multiple injuries, initial encounter: Secondary | ICD-10-CM

## 2016-05-06 DIAGNOSIS — S8011XA Contusion of right lower leg, initial encounter: Secondary | ICD-10-CM | POA: Diagnosis not present

## 2016-05-06 DIAGNOSIS — G8929 Other chronic pain: Secondary | ICD-10-CM

## 2016-05-06 DIAGNOSIS — S8991XA Unspecified injury of right lower leg, initial encounter: Secondary | ICD-10-CM | POA: Diagnosis present

## 2016-05-06 DIAGNOSIS — Z79899 Other long term (current) drug therapy: Secondary | ICD-10-CM | POA: Diagnosis not present

## 2016-05-06 DIAGNOSIS — S20219A Contusion of unspecified front wall of thorax, initial encounter: Secondary | ICD-10-CM | POA: Diagnosis not present

## 2016-05-06 DIAGNOSIS — S8012XA Contusion of left lower leg, initial encounter: Secondary | ICD-10-CM | POA: Insufficient documentation

## 2016-05-06 DIAGNOSIS — S40022A Contusion of left upper arm, initial encounter: Secondary | ICD-10-CM | POA: Insufficient documentation

## 2016-05-06 DIAGNOSIS — S40021A Contusion of right upper arm, initial encounter: Secondary | ICD-10-CM | POA: Diagnosis not present

## 2016-05-06 LAB — URINALYSIS, ROUTINE W REFLEX MICROSCOPIC
Bilirubin Urine: NEGATIVE
GLUCOSE, UA: NEGATIVE mg/dL
KETONES UR: NEGATIVE mg/dL
Nitrite: NEGATIVE
PROTEIN: NEGATIVE mg/dL
Specific Gravity, Urine: 1.021 (ref 1.005–1.030)
WBC, UA: NONE SEEN WBC/hpf (ref 0–5)
pH: 5 (ref 5.0–8.0)

## 2016-05-06 LAB — CBC WITH DIFFERENTIAL/PLATELET
BASOS ABS: 0 10*3/uL (ref 0.0–0.1)
Basophils Relative: 0 %
EOS PCT: 0 %
Eosinophils Absolute: 0 10*3/uL (ref 0.0–0.7)
HEMATOCRIT: 34.7 % — AB (ref 36.0–46.0)
Hemoglobin: 11.1 g/dL — ABNORMAL LOW (ref 12.0–15.0)
LYMPHS ABS: 1.7 10*3/uL (ref 0.7–4.0)
LYMPHS PCT: 22 %
MCH: 28.2 pg (ref 26.0–34.0)
MCHC: 32 g/dL (ref 30.0–36.0)
MCV: 88.1 fL (ref 78.0–100.0)
MONO ABS: 0.6 10*3/uL (ref 0.1–1.0)
Monocytes Relative: 8 %
NEUTROS ABS: 5.3 10*3/uL (ref 1.7–7.7)
Neutrophils Relative %: 70 %
PLATELETS: 226 10*3/uL (ref 150–400)
RBC: 3.94 MIL/uL (ref 3.87–5.11)
RDW: 14.6 % (ref 11.5–15.5)
WBC: 7.6 10*3/uL (ref 4.0–10.5)

## 2016-05-06 LAB — RAPID URINE DRUG SCREEN, HOSP PERFORMED
AMPHETAMINES: NOT DETECTED
BENZODIAZEPINES: NOT DETECTED
Barbiturates: NOT DETECTED
COCAINE: NOT DETECTED
OPIATES: NOT DETECTED
Tetrahydrocannabinol: NOT DETECTED

## 2016-05-06 LAB — ETHANOL: Alcohol, Ethyl (B): <5 mg/dL (ref <5)

## 2016-05-06 LAB — BASIC METABOLIC PANEL
Anion gap: 6 (ref 5–15)
BUN: 14 mg/dL (ref 6–20)
CHLORIDE: 111 mmol/L (ref 101–111)
CO2: 27 mmol/L (ref 22–32)
CREATININE: 0.86 mg/dL (ref 0.44–1.00)
Calcium: 8.9 mg/dL (ref 8.9–10.3)
GFR calc Af Amer: >60 mL/min (ref >60)
GFR calc non Af Amer: >60 mL/min (ref >60)
Glucose, Bld: 106 mg/dL — ABNORMAL HIGH (ref 65–99)
POTASSIUM: 3.5 mmol/L (ref 3.5–5.1)
Sodium: 144 mmol/L (ref 135–145)

## 2016-05-06 MED ORDER — LISINOPRIL 20 MG PO TABS: 20.0000 mg | ORAL_TABLET | Freq: Every day | ORAL | 0 refills | Status: DC

## 2016-05-06 NOTE — ED Triage Notes (Addendum)
Pt recently treated and left AMA from [facility] for HTN, CP, and Kidney Infection. Pt would like to be treated here for the same. PT reports 8/10 cp w/o radiation. Pt A+OX4, speaking in complete sentences.
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
## 2016-05-06 NOTE — Discharge Instructions (Signed)
Plenty of rest, and drink a lot of fluids.  Start taking the blood pressure medication as soon as possible.  Use the phone number on this document, to help you find a primary care physician, to see for ongoing medical care as soon as possible.  Follow-up with your pain management doctor as soon as possible.

## 2016-05-23 ENCOUNTER — Emergency Department (HOSPITAL_COMMUNITY): Payer: Medicaid Other

## 2016-05-23 ENCOUNTER — Encounter (HOSPITAL_COMMUNITY): Payer: Self-pay | Admitting: *Deleted

## 2016-05-23 ENCOUNTER — Inpatient Hospital Stay (HOSPITAL_COMMUNITY)
Admission: EM | Admit: 2016-05-23 | Discharge: 2016-05-26 | DRG: 917 | Disposition: A | Discharge status: Other Acute Inpatient Hospital | Payer: Medicaid Other | Attending: Family Medicine | Admitting: Family Medicine

## 2016-05-23 DIAGNOSIS — T40601A Poisoning by unspecified narcotics, accidental (unintentional), initial encounter: Secondary | ICD-10-CM | POA: Diagnosis not present

## 2016-05-23 DIAGNOSIS — E872 Acidosis: Secondary | ICD-10-CM | POA: Diagnosis present

## 2016-05-23 DIAGNOSIS — D649 Anemia, unspecified: Secondary | ICD-10-CM | POA: Diagnosis present

## 2016-05-23 DIAGNOSIS — G92 Toxic encephalopathy: Secondary | ICD-10-CM | POA: Diagnosis present

## 2016-05-23 DIAGNOSIS — E861 Hypovolemia: Secondary | ICD-10-CM | POA: Diagnosis present

## 2016-05-23 DIAGNOSIS — Z79899 Other long term (current) drug therapy: Secondary | ICD-10-CM | POA: Diagnosis not present

## 2016-05-23 DIAGNOSIS — E875 Hyperkalemia: Secondary | ICD-10-CM | POA: Diagnosis present

## 2016-05-23 DIAGNOSIS — Z885 Allergy status to narcotic agent status: Secondary | ICD-10-CM | POA: Diagnosis not present

## 2016-05-23 DIAGNOSIS — T424X2A Poisoning by benzodiazepines, intentional self-harm, initial encounter: Secondary | ICD-10-CM | POA: Diagnosis not present

## 2016-05-23 DIAGNOSIS — J9622 Acute and chronic respiratory failure with hypercapnia: Secondary | ICD-10-CM | POA: Diagnosis present

## 2016-05-23 DIAGNOSIS — T50902A Poisoning by unspecified drugs, medicaments and biological substances, intentional self-harm, initial encounter: Secondary | ICD-10-CM | POA: Diagnosis present

## 2016-05-23 DIAGNOSIS — R739 Hyperglycemia, unspecified: Secondary | ICD-10-CM | POA: Diagnosis present

## 2016-05-23 DIAGNOSIS — J9602 Acute respiratory failure with hypercapnia: Secondary | ICD-10-CM

## 2016-05-23 DIAGNOSIS — N179 Acute kidney failure, unspecified: Secondary | ICD-10-CM | POA: Diagnosis present

## 2016-05-23 DIAGNOSIS — K219 Gastro-esophageal reflux disease without esophagitis: Secondary | ICD-10-CM | POA: Diagnosis present

## 2016-05-23 DIAGNOSIS — R21 Rash and other nonspecific skin eruption: Secondary | ICD-10-CM | POA: Diagnosis present

## 2016-05-23 DIAGNOSIS — Y92003 Bedroom of unspecified non-institutional (private) residence as the place of occurrence of the external cause: Secondary | ICD-10-CM

## 2016-05-23 DIAGNOSIS — R4182 Altered mental status, unspecified: Secondary | ICD-10-CM | POA: Diagnosis present

## 2016-05-23 DIAGNOSIS — J9601 Acute respiratory failure with hypoxia: Secondary | ICD-10-CM

## 2016-05-23 DIAGNOSIS — J9621 Acute and chronic respiratory failure with hypoxia: Secondary | ICD-10-CM | POA: Diagnosis present

## 2016-05-23 DIAGNOSIS — R0600 Dyspnea, unspecified: Secondary | ICD-10-CM | POA: Diagnosis not present

## 2016-05-23 DIAGNOSIS — I4891 Unspecified atrial fibrillation: Secondary | ICD-10-CM | POA: Diagnosis present

## 2016-05-23 DIAGNOSIS — J9600 Acute respiratory failure, unspecified whether with hypoxia or hypercapnia: Secondary | ICD-10-CM | POA: Diagnosis present

## 2016-05-23 DIAGNOSIS — Z888 Allergy status to other drugs, medicaments and biological substances status: Secondary | ICD-10-CM | POA: Diagnosis not present

## 2016-05-23 DIAGNOSIS — F332 Major depressive disorder, recurrent severe without psychotic features: Secondary | ICD-10-CM | POA: Diagnosis present

## 2016-05-23 DIAGNOSIS — G9340 Encephalopathy, unspecified: Secondary | ICD-10-CM | POA: Diagnosis present

## 2016-05-23 DIAGNOSIS — I10 Essential (primary) hypertension: Secondary | ICD-10-CM | POA: Diagnosis present

## 2016-05-23 DIAGNOSIS — T40602A Poisoning by unspecified narcotics, intentional self-harm, initial encounter: Secondary | ICD-10-CM | POA: Diagnosis present

## 2016-05-23 DIAGNOSIS — F1990 Other psychoactive substance use, unspecified, uncomplicated: Secondary | ICD-10-CM | POA: Diagnosis present

## 2016-05-23 DIAGNOSIS — G8929 Other chronic pain: Secondary | ICD-10-CM | POA: Diagnosis present

## 2016-05-23 DIAGNOSIS — R45851 Suicidal ideations: Secondary | ICD-10-CM | POA: Diagnosis not present

## 2016-05-23 DIAGNOSIS — T50901D Poisoning by unspecified drugs, medicaments and biological substances, accidental (unintentional), subsequent encounter: Secondary | ICD-10-CM | POA: Diagnosis not present

## 2016-05-23 DIAGNOSIS — T50901A Poisoning by unspecified drugs, medicaments and biological substances, accidental (unintentional), initial encounter: Secondary | ICD-10-CM

## 2016-05-23 DIAGNOSIS — T1491XA Suicide attempt, initial encounter: Secondary | ICD-10-CM | POA: Diagnosis not present

## 2016-05-23 DIAGNOSIS — T40604A Poisoning by unspecified narcotics, undetermined, initial encounter: Secondary | ICD-10-CM | POA: Diagnosis not present

## 2016-05-23 LAB — BASIC METABOLIC PANEL
ANION GAP: 10 (ref 5–15)
ANION GAP: 7 (ref 5–15)
BUN: 25 mg/dL — ABNORMAL HIGH (ref 6–20)
BUN: 23 mg/dL — ABNORMAL HIGH (ref 6–20)
CALCIUM: 7.8 mg/dL — AB (ref 8.9–10.3)
CALCIUM: 7.9 mg/dL — AB (ref 8.9–10.3)
CO2: 23 mmol/L (ref 22–32)
CO2: 27 mmol/L (ref 22–32)
Chloride: 112 mmol/L — ABNORMAL HIGH (ref 101–111)
Chloride: 109 mmol/L (ref 101–111)
Creatinine, Ser: 2.32 mg/dL — ABNORMAL HIGH (ref 0.44–1.00)
Creatinine, Ser: 1.83 mg/dL — ABNORMAL HIGH (ref 0.44–1.00)
GFR calc Af Amer: 28 mL/min — ABNORMAL LOW (ref >60)
GFR, EST AFRICAN AMERICAN: 38 mL/min — AB (ref >60)
GFR, EST NON AFRICAN AMERICAN: 24 mL/min — AB (ref >60)
GFR, EST NON AFRICAN AMERICAN: 33 mL/min — AB (ref >60)
Glucose, Bld: 200 mg/dL — ABNORMAL HIGH (ref 65–99)
Glucose, Bld: 135 mg/dL — ABNORMAL HIGH (ref 65–99)
Potassium: 5.2 mmol/L — ABNORMAL HIGH (ref 3.5–5.1)
Potassium: 4.4 mmol/L (ref 3.5–5.1)
SODIUM: 145 mmol/L (ref 135–145)
SODIUM: 143 mmol/L (ref 135–145)

## 2016-05-23 LAB — COMPREHENSIVE METABOLIC PANEL
ALK PHOS: 43 U/L (ref 38–126)
ALT: 32 U/L (ref 14–54)
ANION GAP: 11 (ref 5–15)
AST: 69 U/L — ABNORMAL HIGH (ref 15–41)
Albumin: 3.2 g/dL — ABNORMAL LOW (ref 3.5–5.0)
BILIRUBIN TOTAL: 0.3 mg/dL (ref 0.3–1.2)
BUN: 24 mg/dL — AB (ref 6–20)
CHLORIDE: 112 mmol/L — AB (ref 101–111)
CO2: 21 mmol/L — AB (ref 22–32)
CREATININE: 2.82 mg/dL — AB (ref 0.44–1.00)
Calcium: 7.9 mg/dL — ABNORMAL LOW (ref 8.9–10.3)
GFR calc Af Amer: 22 mL/min — ABNORMAL LOW (ref >60)
GFR calc non Af Amer: 19 mL/min — ABNORMAL LOW (ref >60)
Glucose, Bld: 192 mg/dL — ABNORMAL HIGH (ref 65–99)
Potassium: 5.6 mmol/L — ABNORMAL HIGH (ref 3.5–5.1)
Sodium: 144 mmol/L (ref 135–145)
Total Protein: 6.5 g/dL (ref 6.5–8.1)

## 2016-05-23 LAB — BLOOD GAS, ARTERIAL
Acid-Base Excess: 0.3 mmol/L (ref 0.0–2.0)
Bicarbonate: 25.3 mmol/L (ref 20.0–28.0)
Drawn by: 246101
O2 Content: 4 L/min
O2 SAT: 98.2 %
PCO2 ART: 47.6 mmHg (ref 32.0–48.0)
PH ART: 7.346 — AB (ref 7.350–7.450)
Patient temperature: 99.2
pO2, Arterial: 112 mmHg — ABNORMAL HIGH (ref 83.0–108.0)

## 2016-05-23 LAB — RAPID URINE DRUG SCREEN, HOSP PERFORMED
AMPHETAMINES: NOT DETECTED
Amphetamines: NOT DETECTED
BARBITURATES: NOT DETECTED
Barbiturates: NOT DETECTED
Benzodiazepines: POSITIVE — AB
Benzodiazepines: POSITIVE — AB
COCAINE: NOT DETECTED
Cocaine: NOT DETECTED
OPIATES: POSITIVE — AB
Opiates: POSITIVE — AB
TETRAHYDROCANNABINOL: NOT DETECTED
Tetrahydrocannabinol: NOT DETECTED

## 2016-05-23 LAB — ETHANOL: Alcohol, Ethyl (B): <5 mg/dL (ref <5)

## 2016-05-23 LAB — CBC WITH DIFFERENTIAL/PLATELET
Basophils Absolute: 0 10*3/uL (ref 0.0–0.1)
Basophils Relative: 0 %
Eosinophils Absolute: 0 10*3/uL (ref 0.0–0.7)
Eosinophils Relative: 0 %
HCT: 38.6 % (ref 36.0–46.0)
HEMOGLOBIN: 11.4 g/dL — AB (ref 12.0–15.0)
LYMPHS ABS: 0.8 10*3/uL (ref 0.7–4.0)
Lymphocytes Relative: 8 %
MCH: 26.9 pg (ref 26.0–34.0)
MCHC: 29.5 g/dL — AB (ref 30.0–36.0)
MCV: 91 fL (ref 78.0–100.0)
MONOS PCT: 12 %
Monocytes Absolute: 1.2 10*3/uL — ABNORMAL HIGH (ref 0.1–1.0)
NEUTROS PCT: 80 %
Neutro Abs: 8.1 10*3/uL — ABNORMAL HIGH (ref 1.7–7.7)
Platelets: 204 10*3/uL (ref 150–400)
RBC: 4.24 MIL/uL (ref 3.87–5.11)
RDW: 14.8 % (ref 11.5–15.5)
WBC: 10 10*3/uL (ref 4.0–10.5)

## 2016-05-23 LAB — URINALYSIS, ROUTINE W REFLEX MICROSCOPIC
BILIRUBIN URINE: NEGATIVE
GLUCOSE, UA: NEGATIVE mg/dL
KETONES UR: NEGATIVE mg/dL
Nitrite: NEGATIVE
PH: 5 (ref 5.0–8.0)
Protein, ur: 100 mg/dL — AB
Specific Gravity, Urine: 1.017 (ref 1.005–1.030)

## 2016-05-23 LAB — LACTIC ACID, PLASMA
LACTIC ACID, VENOUS: 2.2 mmol/L — AB (ref 0.5–1.9)
LACTIC ACID, VENOUS: 1.8 mmol/L (ref 0.5–1.9)

## 2016-05-23 LAB — I-STAT ARTERIAL BLOOD GAS, ED
Acid-base deficit: 4 mmol/L — ABNORMAL HIGH (ref 0.0–2.0)
Bicarbonate: 23.1 mmol/L (ref 20.0–28.0)
O2 Saturation: 99 %
PCO2 ART: 49.1 mmHg — AB (ref 32.0–48.0)
PH ART: 7.281 — AB (ref 7.350–7.450)
Patient temperature: 98.6
TCO2: 25 mmol/L (ref 0–100)
pO2, Arterial: 137 mmHg — ABNORMAL HIGH (ref 83.0–108.0)

## 2016-05-23 LAB — PREGNANCY, URINE: Preg Test, Ur: NEGATIVE

## 2016-05-23 LAB — PROCALCITONIN: PROCALCITONIN: 0.41 ng/mL

## 2016-05-23 LAB — HCG, QUANTITATIVE, PREGNANCY: HCG, BETA CHAIN, QUANT, S: 1 m[IU]/mL (ref <5)

## 2016-05-23 LAB — LIPASE, BLOOD: LIPASE: 20 U/L (ref 11–51)

## 2016-05-23 LAB — I-STAT CG4 LACTIC ACID, ED: Lactic Acid, Venous: 2.61 mmol/L (ref 0.5–1.9)

## 2016-05-23 LAB — MRSA PCR SCREENING: MRSA BY PCR: NEGATIVE

## 2016-05-23 MED ORDER — SODIUM CHLORIDE 0.9 % IV BOLUS (SEPSIS)
1000.0000 mL | Freq: Once | INTRAVENOUS | Status: AC
  Administered 2016-05-23: 1000 mL via INTRAVENOUS

## 2016-05-23 MED ORDER — SODIUM CHLORIDE 0.9 % IV SOLN: 250.0000 mL | INTRAVENOUS | Status: DC | PRN

## 2016-05-23 MED ORDER — HEPARIN SODIUM (PORCINE) 5000 UNIT/ML IJ SOLN
5000.0000 [IU] | Freq: Three times a day (TID) | INTRAMUSCULAR | Status: DC
  Administered 2016-05-23 – 2016-05-26 (×7): 5000 [IU] via SUBCUTANEOUS
  Filled 2016-05-23 (×7): qty 1

## 2016-05-23 MED ORDER — ONDANSETRON HCL 4 MG/2ML IJ SOLN
4.0000 mg | Freq: Once | INTRAMUSCULAR | Status: AC
  Administered 2016-05-23: 4 mg via INTRAVENOUS

## 2016-05-23 MED ORDER — INSULIN ASPART 100 UNIT/ML IV SOLN
10.0000 [IU] | Freq: Once | INTRAVENOUS | Status: AC
  Administered 2016-05-23: 10 [IU] via INTRAVENOUS
  Filled 2016-05-23: qty 0.1

## 2016-05-23 MED ORDER — SODIUM CHLORIDE 0.9 % IV BOLUS (SEPSIS)
500.0000 mL | Freq: Once | INTRAVENOUS | Status: AC
  Administered 2016-05-23: 500 mL via INTRAVENOUS

## 2016-05-23 MED ORDER — DEXTROSE 50 % IV SOLN
1.0000 | Freq: Once | INTRAVENOUS | Status: AC
  Administered 2016-05-23: 50 mL via INTRAVENOUS
  Filled 2016-05-23: qty 50

## 2016-05-23 MED ORDER — SODIUM CHLORIDE 0.9 % IV SOLN
INTRAVENOUS | Status: DC
  Administered 2016-05-23: 12:00:00 via INTRAVENOUS

## 2016-05-23 MED ORDER — HYDRALAZINE HCL 20 MG/ML IJ SOLN
10.0000 mg | INTRAMUSCULAR | Status: DC | PRN
  Administered 2016-05-23 – 2016-05-24 (×4): 10 mg via INTRAVENOUS
  Filled 2016-05-23 (×3): qty 1

## 2016-05-23 MED ORDER — NALOXONE HCL 2 MG/2ML IJ SOSY
1.0000 mg/h | PREFILLED_SYRINGE | INTRAVENOUS | Status: DC
  Administered 2016-05-23 – 2016-05-24 (×6): 1 mg/h via INTRAVENOUS
  Filled 2016-05-23 (×6): qty 4

## 2016-05-23 MED ORDER — NALOXONE HCL 2 MG/2ML IJ SOSY
1.0000 mg | PREFILLED_SYRINGE | INTRAMUSCULAR | Status: DC | PRN
  Administered 2016-05-23 (×4): 1 mg via INTRAVENOUS
  Filled 2016-05-23 (×2): qty 2

## 2016-05-23 MED ORDER — SODIUM BICARBONATE 8.4 % IV SOLN
50.0000 meq | Freq: Once | INTRAVENOUS | Status: AC
  Administered 2016-05-23: 50 meq via INTRAVENOUS
  Filled 2016-05-23: qty 50

## 2016-05-23 MED ORDER — ONDANSETRON HCL 4 MG/2ML IJ SOLN
INTRAMUSCULAR | Status: AC
  Filled 2016-05-23: qty 2

## 2016-05-23 MED ORDER — ONDANSETRON HCL 4 MG/2ML IJ SOLN
4.0000 mg | Freq: Four times a day (QID) | INTRAMUSCULAR | Status: DC | PRN
  Administered 2016-05-25: 4 mg via INTRAVENOUS
  Filled 2016-05-23: qty 2

## 2016-05-23 MED ORDER — SODIUM CHLORIDE 0.9 % IV SOLN
0.2000 ug/kg/h | INTRAVENOUS | Status: DC
  Administered 2016-05-23: 0.2 ug/kg/h via INTRAVENOUS
  Filled 2016-05-23 (×2): qty 2

## 2016-05-23 MED ORDER — HYDRALAZINE HCL 20 MG/ML IJ SOLN
INTRAMUSCULAR | Status: AC
  Filled 2016-05-23: qty 1

## 2016-05-23 NOTE — H&P (Signed)
Name: Sandra [name] MRN: [id number] DOB: 10/08/1970    LOS: 0  PCCM ADMISSION NOTE  History of Present Illness: Sandra [name] is a 46 year old woman with mood disorder and chronic pain who presented to the hospital with acute respiratory failure and acute encephalopathy. Her fiancee Sandra [name] was at bedside and provided the history as she was unable to speak.  Yesterday afternoon, she took her normal medication [morphine?] though later in the evening, he found her standing over the sink for an hour while she was supposed to be showering. He laid her down in bed but called EMS this morning when he found her in the same position overnight. She has not expressed any recent suicidal ideation though was hospitalized for 1 week in [place] for a possible overdose about a month ago. 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 She sees outpatient psychiatry and a pain clinic. He is unaware of her having any ongoing tobacco, alcohol, illicit drug use.  Of interest, she complained to him about vaginal bleeding over the last 3 weeks for which she was concerned about a miscarriage. Otherwise, she had no other complaints.  Lines / Drains: 4/8 Peripheral IV  Tests / Events: 4/8 Admitted to ICU.  The patient is unable to provide history, so information was collected from he which was obtained for available medical records.    Past Medical History:  Diagnosis Date  . Anemia    a. Mild with Hgb 11.2, 09/2012. UA with mod Hgb. Instructed to f/u PCP.  Marland Kitchen Anxiety   . Anxiety   . Atrial fibrillation with RVR (HCC)    a. Dx 09/2012, spont conv to NSR.  Marland Kitchen Chronic lower back pain   . Depression   . Gastroparesis   . GERD (gastroesophageal reflux disease)   . Gestational diabetes   . H/O echocardiogram    a. 02/2010: done for htn/tachy while pregnant - EF 55-60%, grade 1 d/dysf, mildly dilated LA.  . High cholesterol   . Hypertension   . Kidney  stones   . Migraines   . Obesity   . Panic attack   . Restless legs   . Sleep apnea    Past Surgical History:  Procedure Laterality Date  . BACK SURGERY    . CESAREAN SECTION  2012  . CYSTOSCOPY W/ URETERAL STENT PLACEMENT  2006  . CYSTOSCOPY/RETROGRADE/URETEROSCOPY/STONE EXTRACTION WITH BASKET  1987  . DILATION AND CURETTAGE OF UTERUS  2006; 2010  . TUBAL LIGATION  2012   Prior to Admission medications   Medication Sig Start Date End Date Taking? Authorizing Provider  baclofen (LIORESAL) 10 MG tablet Take 10 mg by mouth 2 (two) times daily.    Historical Provider, MD  busPIRone (BUSPAR) 30 MG tablet Take 30 mg by mouth 3 (three) times daily.     Historical Provider, MD  citalopram (CELEXA) 20 MG tablet Take 20 mg by mouth daily.    Historical Provider, MD  Dexlansoprazole 30 MG capsule Take 1 capsule (30 mg total) by mouth daily. Patient not taking: Reported on 05/06/2016 08/16/15   Joni Reining Pisciotta, PA-C  famotidine (PEPCID) 20 MG tablet Take 1 tablet (20 mg total) by mouth 2 (two) times daily. Patient not taking: Reported on 05/06/2016 03/16/16   Lenox Ponds, MD  lisinopril (PRINIVIL,ZESTRIL) 20 MG tablet Take 1 tablet (20 mg total) by mouth daily. 05/06/16   Mancel Bale, MD  mirtazapine (REMERON)  45 MG tablet Take 45 mg by mouth at bedtime.     Historical Provider, MD  Morphine-Naltrexone (EMBEDA) 60-2.4 MG CPCR Take 1 tablet by mouth daily.    Historical Provider, MD  Multiple Vitamin (MULTIVITAMIN WITH MINERALS) TABS tablet Take 1 tablet by mouth daily.    Historical Provider, MD  ondansetron (ZOFRAN ODT) 4 MG disintegrating tablet Take 1 tablet (4 mg total) by mouth every 8 (eight) hours as needed for nausea or vomiting. Patient not taking: Reported on 03/24/2016 03/16/16   Lenox Ponds, MD  potassium chloride SA (K-DUR,KLOR-CON) 20 MEQ tablet Take 2 tablets (40 mEq total) by mouth daily. Patient not taking: Reported on 05/06/2016 03/24/16 03/31/16  Nira Conn, MD   pregabalin (LYRICA) 300 MG capsule Take 300 mg by mouth 3 (three) times daily as needed (pain).     Historical Provider, MD  promethazine (PHENERGAN) 25 MG suppository Place 1 suppository (25 mg total) rectally every 6 (six) hours as needed for nausea or vomiting. Patient not taking: Reported on 05/06/2016 03/24/16   Nira Conn, MD  Tapentadol HCl (NUCYNTA) 100 MG TABS Take 100 mg by mouth 4 (four) times daily as needed (pain).     Historical Provider, MD  zolpidem (AMBIEN) 10 MG tablet Take 10 mg by mouth at bedtime.    Historical Provider, MD   Allergies Allergies  Allergen Reactions  . Iodides Hives  . Imitrex [Sumatriptan]     Pt states she takes imitrex injection-"sometimes it works and sometimes doesn't"  . Toradol [Ketorolac Tromethamine] Hives  . Metoclopramide Hcl Anxiety    Family History Family History  Problem Relation Age of Onset  . Diabetes Mother   . Hypertension Mother   . Migraines Father   . Migraines Brother     Social History  reports that she has never smoked. She has never used smokeless tobacco. She reports that she does not drink alcohol or use drugs.  Review Of Systems  Unable to assess due to the patient's mental status.  Vital Signs: Temp:  [99.2 F (37.3 C)] 99.2 F (37.3 C) (04/08 0915) Pulse Rate:  [109-125] 109 (04/08 1130) Resp:  [11-29] 16 (04/08 1130) BP: (131-155)/(91-116) 155/105 (04/08 1130) SpO2:  [90 %-100 %] 100 % (04/08 1130) No intake/output data recorded.  Physical Examination: Physical Exam  Constitutional: No distress.  HENT:  Head: Normocephalic and atraumatic.  Eyes: Conjunctivae are normal. Pupils are equal, round, and reactive to light. No scleral icterus.  Cardiovascular: Regular rhythm and intact distal pulses.   No murmur heard. Tachycardic  Pulmonary/Chest: Effort normal. No respiratory distress.  Abdominal: Soft. Bowel sounds are normal. She exhibits no distension.  Surgical scars noted over the  abdomen  Neurological:  Groans to sternal rub. Unable to follow commands.  Skin: Skin is warm and dry. She is not diaphoretic.       Labs and Imaging:  Reviewed.  Please refer to the Assessment and Plan section for relevant results.  Assessment and Plan: Ms. Hurd is a 46 year old woman with mood disorder and chronic pain hospitalized for acute hypercapnic hypoxemic respiratory failure and acute encephalopathy found to have acute kidney injury with concurrent anion gap and non-anion gap metabolic acidoses possibly in the setting of medication ingestion.  Acute hypercapnic hypoxemic respiratory failure: Suspect it is related to ingestion, possibly opiates. [place] DEA database without any records over the past year which is puzzling. EKG without abnormalities suggestive of other medications. -Continue supplemental oxygen for now though  may consider ventilatory support if she decompensates  Acute encephalopathy: Suspect ingestion given acute decompensation over the last 24 hours from history. Home medications with sedative effects include morphine/naltrexone, pregabalin, tapentadol, zolpidem, buspirone, citalopram, baclofen, mirtazipine though EKG without any suggestive abnormalities. Serum ethanol negative. -Continue Narcan gtt and as needed every 1 hour -Follow-up UDS, HIV, PCT  Acute kidney injury with concurrent anion gap and non-anion gap metabolic acidoses: Creatinine 2.8, up from 0.9-1.0 since January. pH is lower than that expected with a pure respiratory acidosis, and change in bicarb is greater than the change in anion gap which suggests two acidoses as opposed to one. Elevated lactate is contributory. -Continue normal saline 125 cc/hr -Trend lactate every 3 hours to document resolution -Recheck BMET every 6 hours to assess improvement -Monitor urine output with strict I&O -Follow-up urinalysis  Hyperkalemia: K 5.6 this morning. -Give bicarb and D50 with insulin -Give 500 cc normal  saline bolus -Follow electrolytes as noted above.  Mood disorder: Suicide precautions until we are able to rule out suicidal ideation. -Follow up serum b-HCG  Hypertension: BP elevated at 155/105 which is not too different from when she presented to the ED 3/22 and received lisinopril.   -Continue following  Hyperglycemia: Glucose 192 on labwork this morning. A1c 5.9 in August 2014.  -Recheck A1c  Best practices / Disposition: -->ICU status under PCCM -->full code -->Heparin for DVT Px -->Protonix for GI Px -->family updated at bedside   Heywood Iles 05/23/2016, 12:51 PM    ATTENDING NOTE / ATTESTATION NOTE :   I have discussed the case with the resident/APP  Dr. Heywood Iles  I agree with the resident/APP's  history, physical examination, assessment, and plans.    I have edited the above note and modified it according to our agreed history, physical examination, assessment and plan.   Briefly,  Ms. Balash is a 46 year old woman with mood disorder and chronic pain who presented to the hospital with acute respiratory failure and acute encephalopathy. Her fiancee Sandra [name] was at bedside and provided the history as she was unable to speak.  Yesterday afternoon, she took her normal medication [morphine?] though later in the evening, he found her standing over the sink for an hour while she was supposed to be showering. He laid her down in bed but called EMS this morning when he found her in the same position overnight. She has not expressed any recent suicidal ideation though was hospitalized for 1 week in [place] for a possible overdose about a month ago. 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 She sees outpatient psychiatry and a pain clinic. He is unaware of her having any ongoing tobacco, alcohol, illicit drug use.  Of interest, she complained to him about vaginal bleeding over the last 3 weeks for which she was  concerned about a miscarriage. Otherwise, she had no other complaints.  Patient was seen at the emergency room and was given Narcan pushes. No significant effect with that so she was started on Narcan drip. When I saw her, she was drowsy, arousable, answered simple questions, eventually would go back to sleep. Currently was protecting her airway.  Vitals:  Vitals:   05/23/16 1015 05/23/16 1030 05/23/16 1100 05/23/16 1130  BP: (!) 141/91 (!) 145/111  (!) 155/105  Pulse: (!) 116 (!) 116 (!) 113 (!) 109  Resp: Temp:      TempSrc:  SpO2: 95% 96% 100% 100%    Constitutional/General: well-nourished, well-developed, Drowsy, arousable, answered simple questions, protecting her airway.  There is no height or weight on file to calculate BMI. Wt Readings from Last 3 Encounters:  03/24/16 91.6 kg (202 lb)  03/14/16 83.5 kg (184 lb)  08/04/15 81.6 kg (180 lb)    HEENT: PERLA, anicteric sclerae. (-) Oral thrush.   Neck: No masses. Midline trachea. No JVD, (-) LAD. (-) bruits appreciated.  Respiratory/Chest: Grossly normal chest. (-) deformity. (-) Accessory muscle use.  Symmetric expansion. Diminished BS on both lung zones. (-) wheezing, rhonchi. Some crackles at bases.  (-) egophony  Cardiovascular: Regular rate and  rhythm, heart sounds normal, no murmur or gallops,  Trace peripheral edema  Gastrointestinal:  Normal bowel sounds. Soft, non-tender. No hepatosplenomegaly.  (-) masses.   Musculoskeletal:  Grossly normal.  Extremities: Grossly normal. (-) clubbing, cyanosis.  Trace  edema  Skin: (-) rash,lesions seen.   Neurological/Psychiatric : Drowsy, arousable. No lateralizing signs elicited.   CBC Recent Labs     05/23/16  0954  WBC  10.0  HGB  11.4*  HCT  38.6  PLT  204    Coag's No results for input(s): APTT, INR in the last 72 hours.  BMET Recent Labs     05/23/16  0954  NA  144  K  5.6*  CL  112*  CO2  21*  BUN  24*  CREATININE  2.82*   GLUCOSE  192*    Electrolytes Recent Labs     05/23/16  0954  CALCIUM  7.9*    Sepsis Markers No results for input(s): PROCALCITON, O2SATVEN in the last 72 hours.  Invalid input(s): LACTICACIDVEN  ABG Recent Labs     05/23/16  1208  PHART  7.281*  PCO2ART  49.1*  PO2ART  137.0*    Liver Enzymes Recent Labs     05/23/16  0954  AST  69*  ALT  32  ALKPHOS  43  BILITOT  0.3  ALBUMIN  3.2*    Cardiac Enzymes No results for input(s): TROPONINI, PROBNP in the last 72 hours.  Glucose No results for input(s): GLUCAP in the last 72 hours.  Imaging Ct Head Wo Contrast  Result Date: 05/23/2016 CLINICAL DATA:  Unresponsive EXAM: CT HEAD WITHOUT CONTRAST TECHNIQUE: Contiguous axial images were obtained from the base of the skull through the vertex without intravenous contrast. COMPARISON:  07/15/2015 FINDINGS: Brain: No evidence of acute infarction, hemorrhage, hydrocephalus, extra-axial collection or mass lesion/mass effect. Normal brain volume. Symmetric gray-white differentiation which is normal for this scanner. Vascular: No hyperdense vessel or unexpected calcification. Skull: Normal. Negative for fracture or focal lesion. Sinuses/Orbits: Negative.  Nasal trumpet in place. IMPRESSION: Negative head CT. Electronically Signed   By: Marnee Spring M.D.   On: 05/23/2016 11:00   Dg Chest Port 1 View  Result Date: 05/23/2016 CLINICAL DATA:  Overdose. Hx of afib, hypertension, sleep apnea, tubal ligation. Nonsmoker. EXAM: PORTABLE CHEST 1 VIEW COMPARISON:  Chest x-rays dated 03/24/2016 and 03/13/2016. FINDINGS: Study is limited by low lung volumes and lordotic patient positioning. Given these limitations, lungs are grossly clear. Heart size and mediastinal contours are likely stable. No pleural effusion or pneumothorax seen. No acute or suspicious osseous finding. IMPRESSION: Limited exam due to lordotic patient positioning. No evidence of acute cardiopulmonary abnormality.  Electronically Signed   By: Bary Richard M.D.   On: 05/23/2016 09:54    Assessment/Plan : Acute on chronic hypoxemic respiratory failure  secondary to unable to protect airway secondary to sedating pain meds intake in a pt with AKI.  H/O Mood disorder.  Appears unintentional per fiance - cont narcan drip - She is protecting her airway for now. Low threshold for intubating if she worsens clinically. - We will check ABG this evening as well as in the morning. - Admit to ICU. - keep NPO  AKI, suspect component of dehydration/hypovolemia. Hyperkalemia. R/O intrinsic kidney disease. - Hyperkalemia has been addressed with medicines. Check electrolytes every 6 hours. - Continue IV fluids. Watch out for congestion. Hold off on ace  inhibitor - If creatinine is not better tomorrow, she will need urine lites as well as kidney ultrasound. - Hopefully she will not need to have hemodialysis.  Mood disorder. Depression. Opiate overdose, not sure  if intentional - Narcan drip - admit  to icu - will need psychiatry evaluation - suicide precaution - check pregnancy test - check drug screen   Concern for SOB. History of atrial fibrillation. - check 2DEcho.    Best practice : heparin sq for DVT prophylaxis.   I spent 30   minutes of Critical Care time with this patient today. This is my time spent independent of the APP or resident.   Family :Family updated at length today by Dr. Allena Katz.    Pollie Meyer, MD 05/23/2016, 1:47 PM [facility] Pager [phone] After 3 pm or if no answer, call [phone]

## 2016-05-23 NOTE — Progress Notes (Signed)
eLink Physician-Brief Progress Note Patient Name: Sandra [name] DOB: 10-07-1970 MRN: [id number]   Date of Service  05/23/2016  HPI/Events of Note  Multiple issues: 1. Acute Urinary Retention - Bladder scan with 1 liter plus. 2. Severe Agitation - Patient trying to get out of bed and 3. HTN - BP currently = 177/66.  eICU Interventions  Will order: 1. Place Foley Catheter. 2. Bilateral soft wrist restraints.  3. Precedex IV infusion. Titrate to RASS = 0.     Intervention Category Major Interventions: Delirium, psychosis, severe agitation - evaluation and management  Sommer,Steven Eugene 05/23/2016, 9:46 PM

## 2016-05-23 NOTE — Progress Notes (Signed)
Black box called to give new admission info. Also made aware patient is hypertensive with BP 193/122 with no PRN meds currently ordered. Awaiting orders. Continuing to monitor.

## 2016-05-23 NOTE — ED Triage Notes (Signed)
Patient from home with EMS after being found unresponsive by family with empty bottle of opioid medication on the floor nearby.  Received total of  narcan intranasally en route with minimal response, EMS assisting ventilation on arrival.  Patient responsive to painful stimuli.

## 2016-05-23 NOTE — Progress Notes (Signed)
Skin assessment: bilateral buttocks with blanchable redness, left inner thigh with blanchable, splotchy red area, left posterior thigh with blanchable redness. Abrasion to chin. Left sided facial swelling with redness and large palpable nodules along jawline. Nursing to continue to monitor.

## 2016-05-23 NOTE — ED Provider Notes (Signed)
[facility]-EMERGENCY DEPT Provider Note   CSN: [id number] Arrival date & time: 05/23/16  0909     History   Chief Complaint Chief Complaint  Patient presents with  . Altered Mental Status    HPI Sandra [name] is a 46 y.o. female.  HPI  Patient presents to the EMS providers after being found unresponsive. Per report the patient was found with a number of bottles of morphine around her. In this reports the patient received multiple doses of intranasal Narcan, with some improvement in her cognition, but with no return to verbal status, nor spontaneous activity.  On arrival the patient is in extremis, listless, with diminished respiratory function, pinpoint pupils, is nonverbal, level V caveat secondary to acuity of condition.  Past Medical History:  Diagnosis Date  . Anemia    a. Mild with Hgb 11.2, 09/2012. UA with mod Hgb. Instructed to f/u PCP.  Marland Kitchen Anxiety   . Anxiety   . Atrial fibrillation with RVR (HCC)    a. Dx 09/2012, spont conv to NSR.  Marland Kitchen Chronic lower back pain   . Depression   . Gastroparesis   . GERD (gastroesophageal reflux disease)   . Gestational diabetes   . H/O echocardiogram    a. 02/2010: done for htn/tachy while pregnant - EF 55-60%, grade 1 d/dysf, mildly dilated LA.  . High cholesterol   . Hypertension   . Kidney stones   . Migraines   . Obesity   . Panic attack   . Restless legs   . Sleep apnea     Patient Active Problem List   Diagnosis Date Noted  . Sepsis (HCC) 03/13/2016  . Pyelonephritis 03/13/2016  . Depression 03/13/2016  . AKI (acute kidney injury) (HCC) 03/13/2016  . Hypokalemia 03/13/2016  . Severe recurrent major depression without psychotic features (HCC) 11/15/2014    Class: Chronic  . Major depressive disorder, single episode, severe without psychosis (HCC) 11/15/2014  . Altered mental status 01/15/2014  . Acute encephalopathy 02/13/2013  . Acute respiratory failure (HCC) 02/13/2013  . Aspiration pneumonia (HCC) 02/13/2013  .  Lumbar spondylosis 04/21/2011  . Trochanteric bursitis 04/21/2011  . Myofascial pain 04/21/2011  . TACHYCARDIA 03/06/2010  . DYSPNEA 01/06/2010  . RESTLESS LEG SYNDROME 12/11/2009  . MIGRAINE HEADACHE 12/11/2009  . GERD (gastroesophageal reflux disease) 12/11/2009  . Gastroparesis 12/11/2009  . DIABETES MELLITUS, GESTATIONAL, HX OF 12/11/2009    Past Surgical History:  Procedure Laterality Date  . BACK SURGERY    . CESAREAN SECTION  2012  . CYSTOSCOPY W/ URETERAL STENT PLACEMENT  2006  . CYSTOSCOPY/RETROGRADE/URETEROSCOPY/STONE EXTRACTION WITH BASKET  1987  . DILATION AND CURETTAGE OF UTERUS  2006; 2010  . TUBAL LIGATION  2012    OB History    No data available       Home Medications    Prior to Admission medications   Medication Sig Start Date End Date Taking? Authorizing Provider  baclofen (LIORESAL) 10 MG tablet Take 10 mg by mouth 2 (two) times daily.    Historical Provider, MD  busPIRone (BUSPAR) 30 MG tablet Take 30 mg by mouth 3 (three) times daily.     Historical Provider, MD  citalopram (CELEXA) 20 MG tablet Take 20 mg by mouth daily.    Historical Provider, MD  Dexlansoprazole 30 MG capsule Take 1 capsule (30 mg total) by mouth daily. Patient not taking: Reported on 05/06/2016 08/16/15   Joni Reining Pisciotta, PA-C  famotidine (PEPCID) 20 MG tablet Take 1 tablet (20 mg total)  by mouth 2 (two) times daily. Patient not taking: Reported on 05/06/2016 03/16/16   Lenox Ponds, MD  lisinopril (PRINIVIL,ZESTRIL) 20 MG tablet Take 1 tablet (20 mg total) by mouth daily. 05/06/16   Mancel Bale, MD  mirtazapine (REMERON) 45 MG tablet Take 45 mg by mouth at bedtime.     Historical Provider, MD  Morphine-Naltrexone (EMBEDA) 60-2.4 MG CPCR Take 1 tablet by mouth daily.    Historical Provider, MD  Multiple Vitamin (MULTIVITAMIN WITH MINERALS) TABS tablet Take 1 tablet by mouth daily.    Historical Provider, MD  ondansetron (ZOFRAN ODT) 4 MG disintegrating tablet Take 1 tablet (4 mg  total) by mouth every 8 (eight) hours as needed for nausea or vomiting. Patient not taking: Reported on 03/24/2016 03/16/16   Lenox Ponds, MD  potassium chloride SA (K-DUR,KLOR-CON) 20 MEQ tablet Take 2 tablets (40 mEq total) by mouth daily. Patient not taking: Reported on 05/06/2016 03/24/16 03/31/16  Nira Conn, MD  pregabalin (LYRICA) 300 MG capsule Take 300 mg by mouth 3 (three) times daily as needed (pain).     Historical Provider, MD  promethazine (PHENERGAN) 25 MG suppository Place 1 suppository (25 mg total) rectally every 6 (six) hours as needed for nausea or vomiting. Patient not taking: Reported on 05/06/2016 03/24/16   Nira Conn, MD  Tapentadol HCl (NUCYNTA) 100 MG TABS Take 100 mg by mouth 4 (four) times daily as needed (pain).     Historical Provider, MD  zolpidem (AMBIEN) 10 MG tablet Take 10 mg by mouth at bedtime.    Historical Provider, MD    Family History Family History  Problem Relation Age of Onset  . Diabetes Mother   . Hypertension Mother   . Migraines Father   . Migraines Brother     Social History Social History  Substance Use Topics  . Smoking status: Never Smoker  . Smokeless tobacco: Never Used  . Alcohol use No     Allergies   Iodides; Imitrex [sumatriptan]; Toradol [ketorolac tromethamine]; and Metoclopramide hcl   Review of Systems Review of Systems  Unable to perform ROS: Acuity of condition     Physical Exam Updated Vital Signs BP (!) 145/111   Pulse (!) 113   Resp 14   SpO2 100%   Physical Exam  Constitutional: She appears ill. She appears distressed.  Obese female unresponsive, flaccid  HENT:  Head: Normocephalic and atraumatic.  Eyes: Conjunctivae and EOM are normal.  Pinpoint and minimally reactive pupils, does not track  Cardiovascular: Normal rate and regular rhythm.   Pulmonary/Chest: No stridor. Bradypnea noted. She has decreased breath sounds. She has rhonchi.  Abdominal: She exhibits no distension.    Musculoskeletal: She exhibits no edema.  Neurological: She is unresponsive.  Response to painful sternal rub, otherwise unresponsive.  Does not follow commands.  Skin: Skin is warm and dry.  Psychiatric: Cognition and memory are impaired.  Nursing note and vitals reviewed.    ED Treatments / Results  Labs (all labs ordered are listed, but only abnormal results are displayed) Labs Reviewed  COMPREHENSIVE METABOLIC PANEL - Abnormal; Notable for the following:       Result Value   Potassium 5.6 (*)    Chloride 112 (*)    CO2 21 (*)    Glucose, Bld 192 (*)    BUN 24 (*)    Creatinine, Ser 2.82 (*)    Calcium 7.9 (*)    Albumin 3.2 (*)    AST 69 (*)  GFR calc non Af Amer 19 (*)    GFR calc Af Amer 22 (*)    All other components within normal limits  CBC WITH DIFFERENTIAL/PLATELET - Abnormal; Notable for the following:    Hemoglobin 11.4 (*)    MCHC 29.5 (*)    Neutro Abs 8.1 (*)    Monocytes Absolute 1.2 (*)    All other components within normal limits  I-STAT CG4 LACTIC ACID, ED - Abnormal; Notable for the following:    Lactic Acid, Venous 2.61 (*)    All other components within normal limits  LIPASE, BLOOD  ETHANOL  URINALYSIS, ROUTINE W REFLEX MICROSCOPIC  RAPID URINE DRUG SCREEN, HOSP PERFORMED    EKG  EKG Interpretation  Date/Time:  Sunday May 23 2016 09:10:10 EDT Ventricular Rate:  116 PR Interval:    QRS Duration: 77 QT Interval:  303 QTC Calculation: 421 R Axis:   66 Text Interpretation:  Sinus tachycardia Abnormal ekg Confirmed by Gerhard Munch  MD ([id number]) on 05/23/2016 9:48:58 AM       Radiology Ct Head Wo Contrast  Result Date: 05/23/2016 CLINICAL DATA:  Unresponsive EXAM: CT HEAD WITHOUT CONTRAST TECHNIQUE: Contiguous axial images were obtained from the base of the skull through the vertex without intravenous contrast. COMPARISON:  07/15/2015 FINDINGS: Brain: No evidence of acute infarction, hemorrhage, hydrocephalus, extra-axial collection or  mass lesion/mass effect. Normal brain volume. Symmetric gray-white differentiation which is normal for this scanner. Vascular: No hyperdense vessel or unexpected calcification. Skull: Normal. Negative for fracture or focal lesion. Sinuses/Orbits: Negative.  Nasal trumpet in place. IMPRESSION: Negative head CT. Electronically Signed   By: Marnee Spring M.D.   On: 05/23/2016 11:00   Dg Chest Port 1 View  Result Date: 05/23/2016 CLINICAL DATA:  Overdose. Hx of afib, hypertension, sleep apnea, tubal ligation. Nonsmoker. EXAM: PORTABLE CHEST 1 VIEW COMPARISON:  Chest x-rays dated 03/24/2016 and 03/13/2016. FINDINGS: Study is limited by low lung volumes and lordotic patient positioning. Given these limitations, lungs are grossly clear. Heart size and mediastinal contours are likely stable. No pleural effusion or pneumothorax seen. No acute or suspicious osseous finding. IMPRESSION: Limited exam due to lordotic patient positioning. No evidence of acute cardiopulmonary abnormality. Electronically Signed   By: Bary Richard M.D.   On: 05/23/2016 09:54    Procedures Procedures (including critical care time)  Medications Ordered in ED Medications  naloxone [facility]) injection 1 mg (1 mg Intravenous Given 05/23/16 1015)  naloxone (NARCAN) 4 mg in dextrose 5 % 250 mL infusion (1 mg/hr Intravenous New Bag/Given 05/23/16 1038)  0.9 %  sodium chloride infusion (not administered)  sodium chloride 0.9 % bolus 1,000 mL (0 mLs Intravenous Stopped 05/23/16 1030)  ondansetron (ZOFRAN) injection 4 mg (4 mg Intravenous Given 05/23/16 0914)     Initial Impression / Assessment and Plan / ED Course  I have reviewed the triage vital signs and the nursing notes.  Pertinent labs & imaging results that were available during my care of the patient were reviewed by me and considered in my medical decision making (see chart for details).   After the initial evaluation with chart review performed, demonstrating the patient has  multiple psychiatric issues, is currently taking a mixed medication, Embeda, naltrexone plus morphine, and there is concern for overdose. Patient received additional doses of Narcan, passive supplemental oxygen. Patient had one episode of substantial vomitus. Patient received fluid resuscitation, Zofran, continuous monitoring.   11:09 AM Allergies 3 doses of Narcan the patient has diminished respiratory function  again. Patient starting on Narcan drip.  11:09 AM Patient now on a Narcan drip, respiratory effort is improved, though she remains minimally responsive. Head CT reassuring. Initial labs notable for acute kidney injury, hyperkalemia. Patient's fianc is also arrived. He notes that he does not know what the patient may have ingested, but states that she appears substantially better than just prior to calling EMS.  With concern for the patient's acute kidney injury, likely overdose, persistent minimal interactivity, and dependence on Narcan drip to preserve respiratory function, the patient required admission to the ICU.    Final Clinical Impressions(s) / ED Diagnoses  Overdose Acute kidney injury Hyperkalemia  CRITICAL CARE Performed by: Gerhard Munch Total critical care time: 45 minutes Critical care time was exclusive of separately billable procedures and treating other patients. Critical care was necessary to treat or prevent imminent or life-threatening deterioration. Critical care was time spent personally by me on the following activities: development of treatment plan with patient and/or surrogate as well as nursing, discussions with consultants, evaluation of patient's response to treatment, examination of patient, obtaining history from patient or surrogate, ordering and performing treatments and interventions, ordering and review of laboratory studies, ordering and review of radiographic studies, pulse oximetry and re-evaluation of patient's condition.     Gerhard Munch, MD 05/23/16 1110

## 2016-05-23 NOTE — Progress Notes (Signed)
Patient received from ED via stretcher. Responds to voice, does not follow commands, moans, nonverbal, moves legs spontaneously, withdraws from pain x4 extremities. PIVx2, right hand infusing MIVF and narcan gtt. Suicide sitter at bedside. See flowsheet for full assessment and VS. Continuing to monitor.

## 2016-05-23 NOTE — Progress Notes (Signed)
eLink Physician-Brief Progress Note Patient Name: Sandra [name] DOB: [birth year] MRN: [id number]   Date of Service  05/23/2016  HPI/Events of Note  Hypertension - BP = 193/111.  eICU Interventions  Will order: 1. Hydralazine 10 mg IV Q 4 hours PRN SBP > 170 or DBP > 100.     Intervention Category Major Interventions: Hypertension - evaluation and management  Faylynn Stamos [place] 05/23/2016, 5:52 PM

## 2016-05-23 NOTE — ED Notes (Signed)
Attempted labs x 2. 

## 2016-05-23 NOTE — Progress Notes (Signed)
Dr. Arsenio Loader made aware patient's BP continues to climb with current BP 202/127. Orders received and carried out. Continuing to monitor.

## 2016-05-23 NOTE — ED Notes (Signed)
Paged Dr. Christene Slates and reported lactic acid results 2.2 and gave patient update.

## 2016-05-24 ENCOUNTER — Inpatient Hospital Stay (HOSPITAL_COMMUNITY): Payer: Medicaid Other

## 2016-05-24 DIAGNOSIS — T50901D Poisoning by unspecified drugs, medicaments and biological substances, accidental (unintentional), subsequent encounter: Secondary | ICD-10-CM

## 2016-05-24 DIAGNOSIS — R0600 Dyspnea, unspecified: Secondary | ICD-10-CM

## 2016-05-24 DIAGNOSIS — T50901A Poisoning by unspecified drugs, medicaments and biological substances, accidental (unintentional), initial encounter: Secondary | ICD-10-CM

## 2016-05-24 LAB — CBC
HCT: 36.2 % (ref 36.0–46.0)
Hemoglobin: 11 g/dL — ABNORMAL LOW (ref 12.0–15.0)
MCH: 26.6 pg (ref 26.0–34.0)
MCHC: 30.4 g/dL (ref 30.0–36.0)
MCV: 87.7 fL (ref 78.0–100.0)
PLATELETS: 197 10*3/uL (ref 150–400)
RBC: 4.13 MIL/uL (ref 3.87–5.11)
RDW: 14.3 % (ref 11.5–15.5)
WBC: 9.2 10*3/uL (ref 4.0–10.5)

## 2016-05-24 LAB — BASIC METABOLIC PANEL
ANION GAP: 6 (ref 5–15)
BUN: 12 mg/dL (ref 6–20)
CO2: 27 mmol/L (ref 22–32)
Calcium: 8.6 mg/dL — ABNORMAL LOW (ref 8.9–10.3)
Chloride: 106 mmol/L (ref 101–111)
Creatinine, Ser: 0.83 mg/dL (ref 0.44–1.00)
Glucose, Bld: 146 mg/dL — ABNORMAL HIGH (ref 65–99)
POTASSIUM: 3.8 mmol/L (ref 3.5–5.1)
SODIUM: 139 mmol/L (ref 135–145)

## 2016-05-24 LAB — ECHOCARDIOGRAM COMPLETE
E decel time: 165 msec
EERAT: 13.1
FS: 37 % (ref 28–44)
Height: 65 in
IVS/LV PW RATIO, ED: 1.12
LA ID, A-P, ES: 38 mm
LA diam end sys: 38 mm
LA vol A4C: 56 ml
LA vol index: 28.9 mL/m2
LA vol: 56.1 mL
LADIAMINDEX: 1.96 cm/m2
LV E/e' medial: 13.1
LV TDI E'MEDIAL: 7.18
LVEEAVG: 13.1
LVELAT: 8.7 cm/s
LVOT VTI: 22.3 cm
LVOT area: 2.84 cm2
LVOT diameter: 19 mm
LVOT peak grad rest: 7 mmHg
LVOT peak vel: 129 cm/s
LVOTSV: 63 mL
MV Dec: 165
MV Peak grad: 5 mmHg
MV pk A vel: 85.9 m/s
MVPKEVEL: 114 m/s
PW: 13.4 mm — AB (ref 0.6–1.1)
RV LATERAL S' VELOCITY: 18.1 cm/s
RV TAPSE: 20.4 mm
RV sys press: 29 mmHg
Reg peak vel: 257 cm/s
TDI e' lateral: 8.7
TR max vel: 257 cm/s
Weight: 3065.28 oz

## 2016-05-24 LAB — MAGNESIUM: MAGNESIUM: 1.7 mg/dL (ref 1.7–2.4)

## 2016-05-24 LAB — BLOOD GAS, ARTERIAL
ACID-BASE EXCESS: 2.6 mmol/L — AB (ref 0.0–2.0)
BICARBONATE: 26.7 mmol/L (ref 20.0–28.0)
Drawn by: 347621
FIO2: 0.36
O2 Saturation: 98.9 %
Patient temperature: 98.6
pCO2 arterial: 41.5 mmHg (ref 32.0–48.0)
pH, Arterial: 7.424 (ref 7.350–7.450)
pO2, Arterial: 132 mmHg — ABNORMAL HIGH (ref 83.0–108.0)

## 2016-05-24 LAB — PROCALCITONIN: PROCALCITONIN: 0.14 ng/mL

## 2016-05-24 LAB — PHOSPHORUS: PHOSPHORUS: 1.8 mg/dL — AB (ref 2.5–4.6)

## 2016-05-24 LAB — HIV ANTIBODY (ROUTINE TESTING W REFLEX): HIV SCREEN 4TH GENERATION: NONREACTIVE

## 2016-05-24 LAB — HEMOGLOBIN A1C
HEMOGLOBIN A1C: 5.6 % (ref 4.8–5.6)
MEAN PLASMA GLUCOSE: 114 mg/dL

## 2016-05-24 MED ORDER — PREGABALIN 75 MG PO CAPS: 300.0000 mg | ORAL_CAPSULE | Freq: Three times a day (TID) | ORAL | Status: DC | PRN

## 2016-05-24 MED ORDER — METOPROLOL TARTRATE 5 MG/5ML IV SOLN
2.5000 mg | INTRAVENOUS | Status: DC | PRN
  Administered 2016-05-24 (×2): 2.5 mg via INTRAVENOUS
  Filled 2016-05-24: qty 5

## 2016-05-24 MED ORDER — CITALOPRAM HYDROBROMIDE 20 MG PO TABS
20.0000 mg | ORAL_TABLET | Freq: Every day | ORAL | Status: DC
  Administered 2016-05-25 – 2016-05-26 (×2): 20 mg via ORAL
  Filled 2016-05-24 (×2): qty 1

## 2016-05-24 NOTE — Care Management Note (Signed)
Case Management Note  Patient Details  Name: Sandra [name] MRN: [id number] Date of Birth: 06-08-1970  Subjective/Objective: Pt admitted on 05/23/16 with AMS, concerning for medication OD.  PTA, pt independent and lives with fiance.                    Action/Plan: Psych consult pending.  Will follow for discharge planning as pt progresses.    Expected Discharge Date:                  Expected Discharge Plan:  Home/Self Care  In-House Referral:  Clinical Social Work  Discharge planning Services  CM Consult  Post Acute Care Choice:    Choice offered to:     DME Arranged:    DME Agency:     HH Arranged:    HH Agency:     Status of Service:  In process, will continue to follow  If discussed at Long Length of Stay Meetings, dates discussed:    Additional Comments:  Glennon Mac, RN 05/24/2016, 5:05 PM

## 2016-05-24 NOTE — Progress Notes (Signed)
  Echocardiogram 2D Echocardiogram has been performed.  Leta Jungling M 05/24/2016, 2:19 PM

## 2016-05-24 NOTE — Progress Notes (Signed)
PULMONARY / CRITICAL CARE MEDICINE   Name: Sandra [name] MRN: [id number] DOB: 1970-03-04    ADMISSION DATE:  05/23/2016 CONSULTATION DATE:  05/23/16  REFERRING MD:  Dr. Jeraldine [name]  CHIEF COMPLAINT:  Altered Mental Status  BRIEF SUMMARY: 46 y/o F admitted 4/8 with altered mental status.  Hx of chronic pain and a mood disorder (followed at a pain clinic / psychiatrist).  Found to have concerns for possible overdose / depression.  Recent hospitalization for overdose approximately one month ago in [place].  Of note, she complained to him about having vaginal bleeding for 3 weeks and she was concerned she had a miscarriage.  Required narcan gtt and ICU monitoring.  Course complicated by agitated delirium and was placed on precedex briefly.  Gtt's weaned off am 4/9.     SUBJECTIVE:  RN reports pt confused > states she doesn't know why she is here.  Reports she had no intent for self harm but staff have high suspicion she is hiding symptoms.  Sandra [name] also concerned.  Nurse Tech reports no vaginal bleeding on foley care   VITAL SIGNS: BP (!) 158/98   Pulse (!) 110   Temp 99 F (37.2 C) (Oral)   Resp 19   Ht  (1.651 m)   Wt 191 lb 9.3 oz (86.9 kg)   SpO2 95%   BMI 31.88 kg/m   HEMODYNAMICS:    VENTILATOR SETTINGS:    INTAKE / OUTPUT: I/O last 3 completed shifts: In: 4776.5 [I.V.:3176.5; Other:1100; IV Piggyback:500] Out: 4550 [Urine:4550]  PHYSICAL EXAMINATION: General:  Adult female lying in bed > head at the foot of the bed, sitter at bedside HEENT: MM pink/moist, no jvd, old scars under chin PSY: calm / bordering sedate, ? If she is selectively answering or sleeping Neuro: Awakens to voice, gentle touch, oriented x3, speech clear, MAE  CV: s1s2 rrr, no m/r/g PULM: even/non-labored, lungs bilaterally clear WU:JWJX, non-tender, bsx4 active  Extremities: warm/dry, no edema, LUE IV infiltrated, mild erythema at site Skin: no rashes or lesions   LABS:  BMET  Recent  Labs Lab 05/23/16 1255 05/23/16 1754 05/24/16 0826  NA 145 143 139  K 5.2* 4.4 3.8  CL 112* 109 106  CO2 BUN 25* 23* 12  CREATININE 2.32* 1.83* 0.83  GLUCOSE 200* 135* 146*    Electrolytes  Recent Labs Lab 05/23/16 1255 05/23/16 1754 05/24/16 0826  CALCIUM 7.8* 7.9* 8.6*  MG  --   --  1.7  PHOS  --   --  1.8*    CBC  Recent Labs Lab 05/23/16 0954 05/24/16 0826  WBC 10.0 9.2  HGB 11.4* 11.0*  HCT 38.6 36.2  PLT 204 197    Coag's No results for input(s): APTT, INR in the last 168 hours.  Sepsis Markers  Recent Labs Lab 05/23/16 1008 05/23/16 1255 05/23/16 1754 05/24/16 0826  LATICACIDVEN 2.61* 2.2* 1.8  --   PROCALCITON  --  0.41  --  0.14    ABG  Recent Labs Lab 05/23/16 1208 05/23/16 1805 05/24/16 0500  PHART 7.281* 7.346* 7.424  PCO2ART 49.1* 47.6 41.5  PO2ART 137.0* 112* 132*    Liver Enzymes  Recent Labs Lab 05/23/16 0954  AST 69*  ALT 32  ALKPHOS 43  BILITOT 0.3  ALBUMIN 3.2*    Cardiac Enzymes No results for input(s): TROPONINI, PROBNP in the last 168 hours.  Glucose No results for input(s): GLUCAP in the last 168 hours.  Imaging Ct Head  Wo Contrast  Result Date: 05/23/2016 CLINICAL DATA:  Unresponsive EXAM: CT HEAD WITHOUT CONTRAST TECHNIQUE: Contiguous axial images were obtained from the base of the skull through the vertex without intravenous contrast. COMPARISON:  07/15/2015 FINDINGS: Brain: No evidence of acute infarction, hemorrhage, hydrocephalus, extra-axial collection or mass lesion/mass effect. Normal brain volume. Symmetric gray-white differentiation which is normal for this scanner. Vascular: No hyperdense vessel or unexpected calcification. Skull: Normal. Negative for fracture or focal lesion. Sinuses/Orbits: Negative.  Nasal trumpet in place. IMPRESSION: Negative head CT. Electronically Signed   By: Marnee Spring M.D.   On: 05/23/2016 11:00   Dg Chest Port 1 View  Result Date: 05/24/2016 CLINICAL  DATA:  Dyspnea, acute respiratory failure, atrial fibrillation, hypertension EXAM: PORTABLE CHEST 1 VIEW COMPARISON:  Portable chest x-ray of May 23, 2016 FINDINGS: The lungs are mildly hypoinflated. There is no focal infiltrate. There is no pleural effusion. The cardiac silhouette is enlarged. The central pulmonary vascularity is mildly prominent. There is no interstitial or alveolar edema. The trachea is midline. The bony thorax exhibits no acute abnormality. IMPRESSION: Mild hypoinflation. Stable mild cardiomegaly without pulmonary edema. Electronically Signed   By: David  [place]iland M.D.   On: 05/24/2016 07:30     STUDIES:  CT Head 4/9 >> negative for acute process   CULTURES: BCx2 4/8 >>   ANTIBIOTICS:   SIGNIFICANT EVENTS: 4/08  Admit with AMS, concern for overdose  LINES/TUBES:   DISCUSSION: 46 y/o F admitted 4/8 with altered mental status and concern for overdose.    ASSESSMENT / PLAN:  PULMONARY A: Acute Hypercarbic & Hypoxemic Respiratory Failure  P:   Wean O2 for sats >92% Pulmonary hygiene  PCT / CXR negative   CARDIOVASCULAR A:  Hypertension  P:  Transition out of ICU Hold home lisinopril for now PRN lopressor for SBP > 170   RENAL A:   Acute Kidney Injury  AG/NG Acidosis  Hyperkalemia  P:   Trend BMP / urinary output Replace electrolytes as indicated Avoid nephrotoxic agents, ensure adequate renal perfusion Continue NS at 50 ml/hr  HEMATOLOGIC A:   Mild Anemia P:  Trend CBC  ENDOCRINE A:   Hyperglycemia   P:   Monitor glucose on BMP   NEUROLOGIC A:   Mood Disorder  Concern for Suicide Attempt  Acute Encephalopathy - UDS positive for opiates / benzo's P:   Suicide sitter  PSY evaluation  Resume home celexa  Hold home pain regimen  Monitor neuro status closely as her home agents have a long half life  GYN A: Pregnancy Ruled Out - ? If this was a psychogenic pregnancy, no bleeding since admit P:  B-HCG negative  Urine pregnancy  negative    FAMILY  - Updates: Patient updated at bedside.   - Global: likely will be able to transfer out of ICU afternoon 4/9.       CC Time:  30 minutes   Sandra Brim, NP-C [facility] Pgr: [phone] or if no answer [phone] 05/24/2016, 10:18 AM   ATTENDING NOTE / ATTESTATION NOTE :   I have discussed the case with the resident/APP  Sandra Brim NP  I agree with the resident/APP's  history, physical examination, assessment, and plans.    I have edited the above note and modified it according to our agreed history, physical examination, assessment and plan.   Briefly, 46 y/o F admitted 4/8 with altered mental status.  Hx of chronic pain and a mood disorder (followed at a  pain clinic / psychiatrist).  Found to have concerns for possible overdose / depression.  Recent hospitalization for overdose approximately one month ago in [place].  Of note, she complained to him about having vaginal bleeding for 3 weeks and she was concerned she had a miscarriage.  Required narcan gtt and ICU monitoring.  Course complicated by agitated delirium and was placed on precedex briefly.  Gtt's weaned off am 4/9.    We turned off Narcan drip this morning. Observing her right now. Off Precedex drip which was started last night because of agitation. She is 75% better as far as her mental status was concerned compared to yesterday. She was able to answer questions today and follow commands. When I saw her yesterday, she was sleeping.  Vitals:  Vitals:   05/24/16 0930 05/24/16 1000 05/24/16 1100 05/24/16 1211  BP: (!) 158/98 (!) 169/109 (!) 170/89 (!) 161/96  Pulse: (!) 110 (!) 101 100 97  Resp: 19 14 (!) 21 16  Temp:    98.9 F (37.2 C)  TempSrc:    Oral  SpO2: 95% 98% 95% 94%  Weight:      Height:        Constitutional/General: well-nourished, well-developed.  not in any distress. Awake, oriented 2, follows commands. Goes into episodes of confusion.   Body mass index  is 31.88 kg/m. Wt Readings from Last 3 Encounters:  05/24/16 86.9 kg (191 lb 9.3 oz)  03/24/16 91.6 kg (202 lb)  03/14/16 83.5 kg (184 lb)    HEENT: PERLA, anicteric sclerae. (-) Oral thrush.   Neck: No masses. Midline trachea. No JVD, (-) LAD. (-) bruits appreciated.  Respiratory/Chest: Grossly normal chest. (-) deformity. (-) Accessory muscle use.  Symmetric expansion. Diminished BS on both lower lung zones. (-) wheezing, crackles, rhonchi (-) egophony  Cardiovascular: Regular rate and  rhythm, heart sounds normal, no murmur or gallops,  Trace peripheral edema  Gastrointestinal:  Normal bowel sounds. Soft, non-tender. No hepatosplenomegaly.  (-) masses.   Musculoskeletal:  Normal muscle tone.   Extremities: Grossly normal. (-) clubbing, cyanosis.  (-) edema  Skin: (-) rash,lesions seen.   Neurological/Psychiatric : sedated, intubated. CN grossly intact. (-) lateralizing signs.     CBC Recent Labs     05/23/16  0954  05/24/16  0826  WBC  10.0  9.2  HGB  11.4*  11.0*  HCT  38.6  36.2  PLT  204  197    Coag's No results for input(s): APTT, INR in the last 72 hours.  BMET Recent Labs     05/23/16  1255  05/23/16  1754  05/24/16  0826  NA  145  143  139  K  5.2*  4.4  3.8  CL  112*  109  106  CO2  BUN  25*  23*  12  CREATININE  2.32*  1.83*  0.83  GLUCOSE  200*  135*  146*    Electrolytes Recent Labs     05/23/16  1255  05/23/16  1754  05/24/16  0826  CALCIUM  7.8*  7.9*  8.6*  MG   --    --   1.7  PHOS   --    --   1.8*    Sepsis Markers Recent Labs     05/23/16  1255  05/24/16  0826  PROCALCITON  0.41  0.14    ABG Recent Labs     05/23/16  1208  05/23/16  1805  05/24/16  0500  PHART  7.281*  7.346*  7.424  PCO2ART  49.1*  47.6  41.5  PO2ART  137.0*  112*  132*    Liver Enzymes Recent Labs     05/23/16  0954  AST  69*  ALT  32  ALKPHOS  43  BILITOT  0.3  ALBUMIN  3.2*    Cardiac Enzymes No results for  input(s): TROPONINI, PROBNP in the last 72 hours.  Glucose No results for input(s): GLUCAP in the last 72 hours.  Imaging Ct Head Wo Contrast  Result Date: 05/23/2016 CLINICAL DATA:  Unresponsive EXAM: CT HEAD WITHOUT CONTRAST TECHNIQUE: Contiguous axial images were obtained from the base of the skull through the vertex without intravenous contrast. COMPARISON:  07/15/2015 FINDINGS: Brain: No evidence of acute infarction, hemorrhage, hydrocephalus, extra-axial collection or mass lesion/mass effect. Normal brain volume. Symmetric gray-white differentiation which is normal for this scanner. Vascular: No hyperdense vessel or unexpected calcification. Skull: Normal. Negative for fracture or focal lesion. Sinuses/Orbits: Negative.  Nasal trumpet in place. IMPRESSION: Negative head CT. Electronically Signed   By: Marnee Spring M.D.   On: 05/23/2016 11:00   Dg Chest Port 1 View  Result Date: 05/24/2016 CLINICAL DATA:  Dyspnea, acute respiratory failure, atrial fibrillation, hypertension EXAM: PORTABLE CHEST 1 VIEW COMPARISON:  Portable chest x-ray of May 23, 2016 FINDINGS: The lungs are mildly hypoinflated. There is no focal infiltrate. There is no pleural effusion. The cardiac silhouette is enlarged. The central pulmonary vascularity is mildly prominent. There is no interstitial or alveolar edema. The trachea is midline. The bony thorax exhibits no acute abnormality. IMPRESSION: Mild hypoinflation. Stable mild cardiomegaly without pulmonary edema. Electronically Signed   By: David  [place]aziland M.D.   On: 05/24/2016 07:30   Dg Chest Port 1 View  Result Date: 05/23/2016 CLINICAL DATA:  Overdose. Hx of afib, hypertension, sleep apnea, tubal ligation. Nonsmoker. EXAM: PORTABLE CHEST 1 VIEW COMPARISON:  Chest x-rays dated 03/24/2016 and 03/13/2016. FINDINGS: Study is limited by low lung volumes and lordotic patient positioning. Given these limitations, lungs are grossly clear. Heart size and mediastinal contours are  likely stable. No pleural effusion or pneumothorax seen. No acute or suspicious osseous finding. IMPRESSION: Limited exam due to lordotic patient positioning. No evidence of acute cardiopulmonary abnormality. Electronically Signed   By: Bary Richard M.D.   On: 05/23/2016 09:54    Assessment/Plan : Overdose, likely intentional. This would have been second overdose in the last 1-2 months. Patient with mood disorder, anxiety, depression. - Turn off Narcan and Precedex drips. Observe until early afternoon in ICU. If she remains stable off the drips, she can be transferred out of ICU. - We'll need psychiatry evaluation once more awake. - Holding off on her outpatient psychiatry meds except for Celexa. - Cont  suicide precaution. Corporate investment banker.   AKI, likely related  to hypovolemia. Improved - Decrease fluid rate to 50 mls/hour. - If taking adequate by mouth, can discontinue IV fluids.  HTN - hold off on home ace inhibitor - prn lopressor.   Best practice :  SCD and heparin for dvt prophylaxis.   Family :  No family at bedside.    Pollie Meyer, MD 05/24/2016, 12:50 PM  Pulmonary and Critical Care Pager [phone] After 3 pm or if no answer, call [phone]

## 2016-05-24 NOTE — Progress Notes (Addendum)
Transfer patient to telemetry and to [facility] as of 4/10 am.     Canary Brim, NP-C [facility] Pgr: [phone] or if no answer [phone] 05/24/2016, 4:18 PM

## 2016-05-25 DIAGNOSIS — R45851 Suicidal ideations: Secondary | ICD-10-CM

## 2016-05-25 LAB — BASIC METABOLIC PANEL
ANION GAP: 11 (ref 5–15)
BUN: 11 mg/dL (ref 6–20)
CO2: 26 mmol/L (ref 22–32)
CREATININE: 0.74 mg/dL (ref 0.44–1.00)
Calcium: 9 mg/dL (ref 8.9–10.3)
Chloride: 103 mmol/L (ref 101–111)
GFR calc non Af Amer: >60 mL/min (ref >60)
Glucose, Bld: 125 mg/dL — ABNORMAL HIGH (ref 65–99)
Potassium: 3.6 mmol/L (ref 3.5–5.1)
SODIUM: 140 mmol/L (ref 135–145)

## 2016-05-25 LAB — MAGNESIUM: MAGNESIUM: 1.7 mg/dL (ref 1.7–2.4)

## 2016-05-25 LAB — CBC
HEMATOCRIT: 38.8 % (ref 36.0–46.0)
HEMOGLOBIN: 12.2 g/dL (ref 12.0–15.0)
MCH: 26.9 pg (ref 26.0–34.0)
MCHC: 31.4 g/dL (ref 30.0–36.0)
MCV: 85.5 fL (ref 78.0–100.0)
Platelets: 266 10*3/uL (ref 150–400)
RBC: 4.54 MIL/uL (ref 3.87–5.11)
RDW: 13.8 % (ref 11.5–15.5)
WBC: 12 10*3/uL — AB (ref 4.0–10.5)

## 2016-05-25 LAB — PHOSPHORUS: PHOSPHORUS: 2.3 mg/dL — AB (ref 2.5–4.6)

## 2016-05-25 LAB — PROCALCITONIN: PROCALCITONIN: 0.11 ng/mL

## 2016-05-25 MED ORDER — METOPROLOL TARTRATE 12.5 MG HALF TABLET
12.5000 mg | ORAL_TABLET | Freq: Two times a day (BID) | ORAL | Status: DC
  Administered 2016-05-25 – 2016-05-26 (×2): 12.5 mg via ORAL
  Filled 2016-05-25 (×3): qty 1

## 2016-05-25 MED ORDER — LORAZEPAM 2 MG/ML IJ SOLN: 1.0000 mg | Freq: Four times a day (QID) | INTRAMUSCULAR | Status: DC | PRN

## 2016-05-25 MED ORDER — LORAZEPAM 1 MG PO TABS: 0.0000 mg | ORAL_TABLET | Freq: Two times a day (BID) | ORAL | Status: DC

## 2016-05-25 MED ORDER — MUPIROCIN 2 % EX OINT
TOPICAL_OINTMENT | Freq: Two times a day (BID) | CUTANEOUS | Status: DC
  Administered 2016-05-25 – 2016-05-26 (×2): via TOPICAL
  Filled 2016-05-25: qty 22

## 2016-05-25 MED ORDER — THIAMINE HCL 100 MG/ML IJ SOLN: 100.0000 mg | Freq: Every day | INTRAMUSCULAR | Status: DC

## 2016-05-25 MED ORDER — MAGNESIUM SULFATE 2 GM/50ML IV SOLN
2.0000 g | Freq: Once | INTRAVENOUS | Status: AC
  Administered 2016-05-25: 2 g via INTRAVENOUS
  Filled 2016-05-25: qty 50

## 2016-05-25 MED ORDER — METOPROLOL TARTRATE 5 MG/5ML IV SOLN: 5.0000 mg | INTRAVENOUS | Status: DC | PRN

## 2016-05-25 MED ORDER — LORAZEPAM 1 MG PO TABS
0.0000 mg | ORAL_TABLET | Freq: Four times a day (QID) | ORAL | Status: DC
  Administered 2016-05-25 – 2016-05-26 (×2): 1 mg via ORAL
  Filled 2016-05-25 (×2): qty 1

## 2016-05-25 MED ORDER — ACETAMINOPHEN 325 MG PO TABS
650.0000 mg | ORAL_TABLET | Freq: Four times a day (QID) | ORAL | Status: DC | PRN
  Administered 2016-05-25 – 2016-05-26 (×3): 650 mg via ORAL
  Filled 2016-05-25 (×3): qty 2

## 2016-05-25 MED ORDER — VITAMIN B-1 100 MG PO TABS
100.0000 mg | ORAL_TABLET | Freq: Every day | ORAL | Status: DC
  Administered 2016-05-25 – 2016-05-26 (×2): 100 mg via ORAL
  Filled 2016-05-25 (×3): qty 1

## 2016-05-25 MED ORDER — LORAZEPAM 1 MG PO TABS: 1.0000 mg | ORAL_TABLET | Freq: Four times a day (QID) | ORAL | Status: DC | PRN

## 2016-05-25 MED ORDER — PANTOPRAZOLE SODIUM 40 MG PO TBEC
40.0000 mg | DELAYED_RELEASE_TABLET | Freq: Every day | ORAL | Status: DC
  Administered 2016-05-25 – 2016-05-26 (×2): 40 mg via ORAL
  Filled 2016-05-25 (×2): qty 1

## 2016-05-25 MED ORDER — FOLIC ACID 1 MG PO TABS
1.0000 mg | ORAL_TABLET | Freq: Every day | ORAL | Status: DC
  Administered 2016-05-25 – 2016-05-26 (×2): 1 mg via ORAL
  Filled 2016-05-25 (×2): qty 1

## 2016-05-25 NOTE — Progress Notes (Signed)
MD paged about pt requests for med for headache.

## 2016-05-25 NOTE — Progress Notes (Signed)
PROGRESS NOTE  Sandra [name]  ZOX:[id number] DOB: August 17, 1970 DOA: 05/23/2016 PCP: No PCP Per Sandra  Brief Narrative:   46 y/o F with mood disorder and chronic pain who presented with acute respiratory failure and encephalopathy, admitted 4/8 to ICU.  Required narcan gtt and ICU monitoring.  Course complicated by agitated delirium and was placed on precedex briefly.  Gtt's weaned off am 4/9.  She remains tachycardic and hypertensive with emotional lability.  Recent hospitalization for overdose approximately one month ago in [place].  Of note, she complained to him about having vaginal bleeding for 3 weeks and she was concerned she had a miscarriage.     Assessment & Plan:   Principal Problem:   MDD (major depressive disorder), recurrent severe, without psychosis (HCC) Active Problems:   Acute respiratory failure (HCC)   Accidental drug overdose  Overdose, likely intentional. This would have been second overdose in the last 1-2 months. Sandra with mood disorder, anxiety, depression. 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  She has also recently filled prescriptions for nucynta and embeda.   -  Start CIWA for benzo withdrawal  -  Continue to hold narcotics -  Start beta blocker as below -  Doubt PE -  Check TSH  AKI, likely related  to hypovolemia, resolved with IVF - d/c IVF  -  Minimize nephrotoxins  HTN - hold off on home ace inhibitor due to recent AKI -  Add metoprolol 12.5mg  po BID - prn lopressor.   Hypomagnesemia  -  Magnesium sulfate 2gm IV   Vesicular rash in between fingers/around  nailbeds -  Unclear etiology.  Do not have a red base.  Sandra picking at hand.  Not on palm and unilateral -  bactroban and dry dressing to opened ulcer.   DVT prophylaxis:  lovenox Code Status:  full Family Communication:  Sandra alone Disposition Plan:  Inpatient psychiatric facility   Consultants:   Psychiatry  PCCM  Procedures:  none  Antimicrobials:  Anti-infectives    None       Subjective: Having headache.  Asking if I can force her to stay in the hospital.  Has headache.  Denies difficulty breathing, nausea.    Objective: Vitals:   05/24/16 2130 05/24/16 2330 05/25/16 0552 05/25/16 0900  BP: (!) 177/128 (!) 165/109 (!) 158/94 (!) 160/119  Pulse: 87 87 (!) 105 (!) 120  Resp: Temp:  98.5 F (36.9 C) 99 F (37.2 C) 98.4 F (36.9 C)  TempSrc:  Oral Oral Oral  SpO2: 95% 100% 98% 98%  Weight:  82.5 kg (181 lb 12.8 oz) 82.4 kg (181 lb 11.2 oz)   Height:        Intake/Output Summary (Last 24 hours) at 05/25/16 1612 Last data filed at 05/25/16 1503  Gross per 24 hour  Intake             1630 ml  Output             3150 ml  Net            -1520 ml   [employer]  05/24/16 0500 05/24/16 2330 05/25/16 0552  Weight: 86.9 kg (191 lb 9.3 oz) 82.5 kg (181 lb 12.8 oz) 82.4 kg (181 lb 11.2 oz)    Examination:  General exam:  Adult female.  No acute distress.  HEENT:  NCAT, MMM Respiratory system: Clear to auscultation bilaterally Cardiovascular system: tachycardic, regular rhythm, normal S1/S2. No murmurs, rubs, gallops or clicks.  Warm extremities Gastrointestinal system: Normal active bowel sounds, soft, nondistended, nontender. MSK:  Normal tone and bulk, no lower extremity edema Neuro:  Grossly intact    Data Reviewed: I have personally reviewed following labs and imaging studies  CBC:  Recent Labs Lab 05/23/16 0954 05/24/16 0826 05/25/16 0545  WBC 10.0 9.2 12.0*  NEUTROABS 8.1*  --   --   HGB 11.4* 11.0* 12.2  HCT 38.6  36.2 38.8  MCV 91.0 87.7 85.5  PLT 204 197 266   Basic Metabolic Panel:  Recent Labs Lab 05/23/16 0954 05/23/16 1255 05/23/16 1754 05/24/16 0826 05/25/16 0545  NA 144 145 143 139 140  K 5.6* 5.2* 4.4 3.8 3.6  CL 112* 112* 109 106 103  CO2 21* GLUCOSE 192* 200* 135* 146* 125*  BUN 24* 25* 23* 12 11  CREATININE 2.82* 2.32* 1.83* 0.83 0.74  CALCIUM 7.9* 7.8* 7.9* 8.6* 9.0  MG  --   --   --  1.7 1.7  PHOS  --   --   --  1.8* 2.3*   GFR: Estimated Creatinine Clearance: 95.2 mL/min (by C-G formula based on SCr of 0.74 mg/dL). Liver Function Tests:  Recent Labs Lab 05/23/16 0954  AST 69*  ALT 32  ALKPHOS 43  BILITOT 0.3  PROT 6.5  ALBUMIN 3.2*    Recent Labs Lab 05/23/16 0954  LIPASE 20   No results for input(s): AMMONIA in the last 168 hours. Coagulation Profile: No results for input(s): INR, PROTIME in the last 168 hours. Cardiac Enzymes: No results for input(s): CKTOTAL, CKMB, CKMBINDEX, TROPONINI in the last 168 hours. BNP (last 3 results) No results for input(s): PROBNP in the last 8760 hours. HbA1C:  Recent Labs  05/23/16 1754  HGBA1C 5.6   CBG: No results for input(s): GLUCAP in the last 168 hours. Lipid Profile: No results for input(s): CHOL, HDL, LDLCALC, TRIG, CHOLHDL, LDLDIRECT in the last 72 hours. Thyroid Function Tests: No results for input(s): TSH, T4TOTAL, FREET4, T3FREE, THYROIDAB in the last 72 hours. Anemia Panel: No results for input(s): VITAMINB12, FOLATE, FERRITIN, TIBC, IRON, RETICCTPCT in the last 72 hours. Urine analysis:    Component Value Date/Time   COLORURINE AMBER (A) 05/23/2016 0912   APPEARANCEUR CLOUDY (A) 05/23/2016 0912   LABSPEC 1.017 05/23/2016 0912   PHURINE 5.0 05/23/2016 0912   GLUCOSEU NEGATIVE 05/23/2016 0912   HGBUR LARGE (A) 05/23/2016 0912   BILIRUBINUR NEGATIVE 05/23/2016 0912   KETONESUR NEGATIVE 05/23/2016 0912   PROTEINUR 100 (A) 05/23/2016 0912   UROBILINOGEN 0.2 04/10/2014 2229    NITRITE NEGATIVE 05/23/2016 0912   LEUKOCYTESUR MODERATE (A) 05/23/2016 0912   Sepsis Labs: (procalcitonin:4,lacticidven:4)  ) Recent Results (from the past 240 hour(s))  MRSA PCR Screening     Status: None   Collection Time: 05/23/16  5:05 PM  Result Value Ref Range Status   MRSA by PCR NEGATIVE NEGATIVE Final    Comment:        The GeneXpert MRSA Assay (FDA approved for NASAL specimens only), is one component of a comprehensive MRSA colonization surveillance program. It is  not intended to diagnose MRSA infection nor to guide or monitor treatment for MRSA infections.   Culture, blood (Routine X 2) w Reflex to ID Panel     Status: None (Preliminary result)   Collection Time: 05/23/16  5:45 PM  Result Value Ref Range Status   Specimen Description BLOOD LEFT ANTECUBITAL  Final   Special Requests   Final    BOTTLES DRAWN AEROBIC AND ANAEROBIC Blood Culture adequate volume   Culture NO GROWTH 2 DAYS  Final   Report Status PENDING  Incomplete  Culture, blood (routine x 2)     Status: None (Preliminary result)   Collection Time: 05/23/16  6:01 PM  Result Value Ref Range Status   Specimen Description BLOOD RIGHT HAND  Final   Special Requests   Final    BOTTLES DRAWN AEROBIC AND ANAEROBIC Blood Culture adequate volume   Culture NO GROWTH 2 DAYS  Final   Report Status PENDING  Incomplete      Radiology Studies: Dg Chest Port 1 View  Result Date: 05/24/2016 CLINICAL DATA:  Dyspnea, acute respiratory failure, atrial fibrillation, hypertension EXAM: PORTABLE CHEST 1 VIEW COMPARISON:  Portable chest x-ray of May 23, 2016 FINDINGS: The lungs are mildly hypoinflated. There is no focal infiltrate. There is no pleural effusion. The cardiac silhouette is enlarged. The central pulmonary vascularity is mildly prominent. There is no interstitial or alveolar edema. The trachea is midline. The bony thorax exhibits no acute abnormality. IMPRESSION: Mild hypoinflation. Stable mild  cardiomegaly without pulmonary edema. Electronically Signed   By: David  [place]azil[place] M.D.   On: 05/24/2016 07:30     Scheduled Meds: . citalopram  20 mg Oral Daily  . heparin  5,000 Units Subcutaneous Q8H   Continuous Infusions: . sodium chloride 50 mL/hr at 05/24/16 0948     LOS: 2 days    Time spent: 30 min    Renae Fickle, MD [facility] Pager [phone]  If 7PM-7AM, please contact night-coverage [email or web address] Password [email or web address] 05/25/2016, 4:12 PM

## 2016-05-25 NOTE — Consult Note (Signed)
[facility] Face-to-Face Psychiatry Consult   Reason for Consult:  AMS and possible opioid overdose Referring Physician:  Dr. Sheran Fava Patient Identification: Sandra [name] MRN:  [id number] Principal Diagnosis: MDD (major depressive disorder), recurrent severe, without psychosis ([place]) Diagnosis:   Patient Active Problem List   Diagnosis Date Noted  . Accidental drug overdose [T50.901A]   . Opiate overdose [T40.601A]   . Sepsis ([place]) [A41.9] 03/13/2016  . Pyelonephritis [N12] 03/13/2016  . Depression [F32.9] 03/13/2016  . AKI (acute kidney injury) ([place]) [N17.9] 03/13/2016  . Hypokalemia [E87.6] 03/13/2016  . Severe recurrent major depression without psychotic features ([place]) [F33.2] 11/15/2014    Class: Chronic  . Major depressive disorder, single episode, severe without psychosis ([place]) [F32.2] 11/15/2014  . Altered mental status [R41.82] 01/15/2014  . Acute encephalopathy [G93.40] 02/13/2013  . Acute respiratory failure ([place]) [J96.00] 02/13/2013  . Aspiration pneumonia ([place]) [J69.0] 02/13/2013  . Lumbar spondylosis [M47.816] 04/21/2011  . Trochanteric bursitis [M70.60] 04/21/2011  . Myofascial pain [M79.1] 04/21/2011  . TACHYCARDIA [R00.0] 03/06/2010  . DYSPNEA [R06.02] 01/06/2010  . RESTLESS LEG SYNDROME [G25.81] 12/11/2009  . MIGRAINE HEADACHE [G43.909] 12/11/2009  . GERD (gastroesophageal reflux disease) [K21.9] 12/11/2009  . Gastroparesis [K31.84] 12/11/2009  . DIABETES MELLITUS, GESTATIONAL, HX OF [Z87.42] 12/11/2009    Total Time spent with patient: 1 hour  Subjective:   Sandra [name] is a 46 y.o. female patient admitted with AMS and possible opioid overdose.  HPI:  Sandra [name] is a 46 year old woman with mood disorder and chronic pain who presented to the hospital with acute respiratory failure and acute encephalopathy. Patient endorses symptoms of depression and intentional poly-drug overdose. She states that she has been under significant stress due to recent [place],  lost her job due to not called in sick at work, could not see her six children age 40 years to 50 years who lives with their dad in [place], [place]. Patient blames her ex-husband lied on her. She was remarried about a year ago and her current husband name is Lynnae Sandhoff but not able to provide collateral during this evaluation. Patient reports that she did not have suicide ideations. She could not tell me her intention of poly-durg overdose except she could not cope up with he stresses.   Medical history: Patient was found with AMS after intentional overdose at her home. Her fiancee Lynnae Sandhoff was at bedside and provided the history as she was unable to speak. Yesterday afternoon, she took her normal medication [morphine?] though later in the evening, he found her standing over the sink for an hour while she was supposed to be showering. He laid her down in bed but called EMS this morning when he found her in the same position overnight. She has not expressed any recent suicidal ideation though was hospitalized for 1 week in [place] for a possible overdose about a month ago. 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 She sees outpatient psychiatry and a pain clinic. He is unaware of her having any ongoing tobacco, alcohol, illicit drug use.  Of interest, she complained to him about vaginal bleeding over the last 3 weeks for which she was concerned about a miscarriage. Otherwise, she had no other complaints.  Past Psychiatric History: She has history of [facility] admission for suicide attempt when she becomes homeless during November 15, 2014 and November 18, 2014.   Risk to Self: Is patient at risk for suicide?: Yes Risk to  Others:   Prior Inpatient Therapy:   Prior Outpatient Therapy:    Past Medical History:  Past Medical History:  Diagnosis Date  . Anemia    a. Mild with Hgb 11.2, 09/2012. UA with mod Hgb. Instructed to f/u PCP.  Marland Kitchen Anxiety   .  Anxiety   . Atrial fibrillation with RVR ([place])    a. Dx 09/2012, spont conv to NSR.  Marland Kitchen Chronic lower back pain   . Depression   . Gastroparesis   . GERD (gastroesophageal reflux disease)   . Gestational diabetes   . H/O echocardiogram    a. 02/2010: done for htn/tachy while pregnant - EF 55-60%, grade 1 d/dysf, mildly dilated LA.  . High cholesterol   . Hypertension   . Kidney stones   . Migraines   . Obesity   . Panic attack   . Restless legs   . Sleep apnea     Past Surgical History:  Procedure Laterality Date  . BACK SURGERY    . CESAREAN SECTION  2012  . CYSTOSCOPY W/ URETERAL STENT PLACEMENT  2006  . CYSTOSCOPY/RETROGRADE/URETEROSCOPY/STONE EXTRACTION WITH BASKET  1987  . DILATION AND CURETTAGE OF UTERUS  2006; 2010  . TUBAL LIGATION  2012   Family History:  Family History  Problem Relation Age of Onset  . Diabetes Mother   . Hypertension Mother   . Migraines Father   . Migraines Brother    Family Psychiatric  History: non contributory Social History:  History  Alcohol Use No     History  Drug Use No    Social History   Social History  . Marital status: Legally Separated    Spouse name: N/A  . Number of children: N/A  . Years of education: N/A   Social History Main Topics  . Smoking status: Never Smoker  . Smokeless tobacco: Never Used  . Alcohol use No  . Drug use: No  . Sexual activity: Not Asked   Other Topics Concern  . None   Social History Narrative   Separated, 6 children   Right handed   2 yr college   3-4 cans of soda         Additional Social History:    Allergies:   Allergies  Allergen Reactions  . Iodides Hives  . Imitrex [Sumatriptan]     Pt states she takes imitrex injection-"sometimes it works and sometimes doesn't"  . Toradol [Ketorolac Tromethamine] Hives  . Metoclopramide Hcl Anxiety    Labs:  Results for orders placed or performed during the hospital encounter of 05/23/16 (from the past 48 hour(s))  Basic  metabolic panel     Status: Abnormal   Collection Time: 05/23/16 12:55 PM  Result Value Ref Range   Sodium 145 135 - 145 mmol/L   Potassium 5.2 (H) 3.5 - 5.1 mmol/L   Chloride 112 (H) 101 - 111 mmol/L   CO2 23 22 - 32 mmol/L   Glucose, Bld 200 (H) 65 - 99 mg/dL   BUN 25 (H) 6 - 20 mg/dL   Creatinine, Ser 2.32 (H) 0.44 - 1.00 mg/dL   Calcium 7.8 (L) 8.9 - 10.3 mg/dL   GFR calc non Af Amer 24 (L) >60 mL/min   GFR calc Af Amer 28 (L) >60 mL/min    Comment: (NOTE) The eGFR has been calculated using the CKD EPI equation. This calculation has not been validated in all clinical situations. eGFR's persistently <60 mL/min signify possible Chronic Kidney Disease.  Anion gap 10 5 - 15  Procalcitonin     Status: None   Collection Time: 05/23/16 12:55 PM  Result Value Ref Range   Procalcitonin 0.41 ng/mL    Comment:        Interpretation: PCT (Procalcitonin) <= 0.5 ng/mL: Systemic infection (sepsis) is not likely. Local bacterial infection is possible. (NOTE)         ICU PCT Algorithm               Non ICU PCT Algorithm    ----------------------------     ------------------------------         PCT < 0.25 ng/mL                 PCT < 0.1 ng/mL     Stopping of antibiotics            Stopping of antibiotics       strongly encouraged.               strongly encouraged.    ----------------------------     ------------------------------       PCT level decrease by               PCT < 0.25 ng/mL       >= 80% from peak PCT       OR PCT 0.25 - 0.5 ng/mL          Stopping of antibiotics                                             encouraged.     Stopping of antibiotics           encouraged.    ----------------------------     ------------------------------       PCT level decrease by              PCT >= 0.25 ng/mL       < 80% from peak PCT        AND PCT >= 0.5 ng/mL            Continuin g antibiotics                                              encouraged.       Continuing antibiotics             encouraged.    ----------------------------     ------------------------------     PCT level increase compared          PCT > 0.5 ng/mL         with peak PCT AND          PCT >= 0.5 ng/mL             Escalation of antibiotics                                          strongly encouraged.      Escalation of antibiotics        strongly encouraged.   hCG, quantitative, pregnancy     Status: None   Collection Time: 05/23/16 12:55 PM  Result  Value Ref Range   hCG, Beta Chain, Quant, S 1 <5 mIU/mL    Comment:          GEST. AGE      CONC.  (mIU/mL)   <=1 WEEK        5 - 50     2 WEEKS       50 - 500     3 WEEKS       100 - 10,000     4 WEEKS     1,000 - 30,000     5 WEEKS     3,500 - 115,000   6-8 WEEKS     12,000 - 270,000    12 WEEKS     15,000 - 220,000        FEMALE AND NON-PREGNANT FEMALE:     LESS THAN 5 mIU/mL   Lactic acid, plasma     Status: Abnormal   Collection Time: 05/23/16 12:55 PM  Result Value Ref Range   Lactic Acid, Venous 2.2 (HH) 0.5 - 1.9 mmol/L    Comment: CRITICAL RESULT CALLED TO, READ BACK BY AND VERIFIED WITH: C.PRICE,RN 05/23/16 @1426  BY V.WILKINS   MRSA PCR Screening     Status: None   Collection Time: 05/23/16  5:05 PM  Result Value Ref Range   MRSA by PCR NEGATIVE NEGATIVE    Comment:        The GeneXpert MRSA Assay (FDA approved for NASAL specimens only), is one component of a comprehensive MRSA colonization surveillance program. It is not intended to diagnose MRSA infection nor to guide or monitor treatment for MRSA infections.   Pregnancy, urine     Status: None   Collection Time: 05/23/16  5:05 PM  Result Value Ref Range   Preg Test, Ur NEGATIVE NEGATIVE    Comment:        THE SENSITIVITY OF THIS METHODOLOGY IS >20 mIU/mL.   Rapid urine drug screen (hospital performed)     Status: Abnormal   Collection Time: 05/23/16  5:05 PM  Result Value Ref Range   Opiates POSITIVE (A) NONE DETECTED   Cocaine NONE DETECTED NONE DETECTED    Benzodiazepines POSITIVE (A) NONE DETECTED   Amphetamines NONE DETECTED NONE DETECTED   Tetrahydrocannabinol NONE DETECTED NONE DETECTED   Barbiturates NONE DETECTED NONE DETECTED    Comment:        DRUG SCREEN FOR MEDICAL PURPOSES ONLY.  IF CONFIRMATION IS NEEDED FOR ANY PURPOSE, NOTIFY LAB WITHIN 5 DAYS.        LOWEST DETECTABLE LIMITS FOR URINE DRUG SCREEN Drug Class       Cutoff (ng/mL) Amphetamine      1000 Barbiturate      200 Benzodiazepine   625 Tricyclics       638 Opiates          300 Cocaine          300 THC              50   Culture, blood (Routine X 2) w Reflex to ID Panel     Status: None (Preliminary result)   Collection Time: 05/23/16  5:45 PM  Result Value Ref Range   Specimen Description BLOOD LEFT ANTECUBITAL    Special Requests      BOTTLES DRAWN AEROBIC AND ANAEROBIC Blood Culture adequate volume   Culture NO GROWTH 2 DAYS    Report Status PENDING   Basic metabolic panel     Status: Abnormal  Collection Time: 05/23/16  5:54 PM  Result Value Ref Range   Sodium 143 135 - 145 mmol/L   Potassium 4.4 3.5 - 5.1 mmol/L   Chloride 109 101 - 111 mmol/L   CO2 27 22 - 32 mmol/L   Glucose, Bld 135 (H) 65 - 99 mg/dL   BUN 23 (H) 6 - 20 mg/dL   Creatinine, Ser 1.83 (H) 0.44 - 1.00 mg/dL   Calcium 7.9 (L) 8.9 - 10.3 mg/dL   GFR calc non Af Amer 33 (L) >60 mL/min   GFR calc Af Amer 38 (L) >60 mL/min    Comment: (NOTE) The eGFR has been calculated using the CKD EPI equation. This calculation has not been validated in all clinical situations. eGFR's persistently <60 mL/min signify possible Chronic Kidney Disease.    Anion gap 7 5 - 15  Lactic acid, plasma     Status: None   Collection Time: 05/23/16  5:54 PM  Result Value Ref Range   Lactic Acid, Venous 1.8 0.5 - 1.9 mmol/L  Hemoglobin A1c     Status: None   Collection Time: 05/23/16  5:54 PM  Result Value Ref Range   Hgb A1c MFr Bld 5.6 4.8 - 5.6 %    Comment: (NOTE)         Pre-diabetes: 5.7 - 6.4          Diabetes: >6.4         Glycemic control for adults with diabetes: <7.0    Mean Plasma Glucose 114 mg/dL    Comment: (NOTE) Performed At: [facility] [place], [place] [id number] Lindon Romp MD PT:[id number]   Culture, blood (routine x 2)     Status: None (Preliminary result)   Collection Time: 05/23/16  6:01 PM  Result Value Ref Range   Specimen Description BLOOD RIGHT HAND    Special Requests      BOTTLES DRAWN AEROBIC AND ANAEROBIC Blood Culture adequate volume   Culture NO GROWTH 2 DAYS    Report Status PENDING   Blood gas, arterial     Status: Abnormal   Collection Time: 05/23/16  6:05 PM  Result Value Ref Range   O2 Content 4.0 L/min   Delivery systems NASAL CANNULA    pH, Arterial 7.346 (L) 7.350 - 7.450   pCO2 arterial 47.6 32.0 - 48.0 mmHg   pO2, Arterial 112 (H) 83.0 - 108.0 mmHg   Bicarbonate 25.3 20.0 - 28.0 mmol/L   Acid-Base Excess 0.3 0.0 - 2.0 mmol/L   O2 Saturation 98.2 %   Patient temperature 99.2    Collection site LEFT RADIAL    Drawn by [id number]    Sample type ARTERIAL DRAW    Allens test (pass/fail) PASS PASS  Blood gas, arterial     Status: Abnormal   Collection Time: 05/24/16  5:00 AM  Result Value Ref Range   FIO2 0.36    pH, Arterial 7.424 7.350 - 7.450   pCO2 arterial 41.5 32.0 - 48.0 mmHg   pO2, Arterial 132 (H) 83.0 - 108.0 mmHg   Bicarbonate 26.7 20.0 - 28.0 mmol/L   Acid-Base Excess 2.6 (H) 0.0 - 2.0 mmol/L   O2 Saturation 98.9 %   Patient temperature 98.6    Collection site LEFT RADIAL    Drawn by [id number]    Sample type ARTERIAL    Allens test (pass/fail) PASS PASS  HIV antibody (Routine Testing)     Status: None   Collection Time: 05/24/16  8:26  AM  Result Value Ref Range   HIV Screen 4th Generation wRfx Non Reactive Non Reactive    Comment: (NOTE) Performed At: [facility] [place], [place] [id number] Lindon Romp MD ME:[id number]   CBC     Status: Abnormal   Collection  Time: 05/24/16  8:26 AM  Result Value Ref Range   WBC 9.2 4.0 - 10.5 K/uL   RBC 4.13 3.87 - 5.11 MIL/uL   Hemoglobin 11.0 (L) 12.0 - 15.0 g/dL   HCT 36.2 36.0 - 46.0 %   MCV 87.7 78.0 - 100.0 fL   MCH 26.6 26.0 - 34.0 pg   MCHC 30.4 30.0 - 36.0 g/dL   RDW 14.3 11.5 - 15.5 %   Platelets 197 150 - 400 K/uL  Basic metabolic panel     Status: Abnormal   Collection Time: 05/24/16  8:26 AM  Result Value Ref Range   Sodium 139 135 - 145 mmol/L   Potassium 3.8 3.5 - 5.1 mmol/L   Chloride 106 101 - 111 mmol/L   CO2 27 22 - 32 mmol/L   Glucose, Bld 146 (H) 65 - 99 mg/dL   BUN 12 6 - 20 mg/dL   Creatinine, Ser 0.83 0.44 - 1.00 mg/dL   Calcium 8.6 (L) 8.9 - 10.3 mg/dL   GFR calc non Af Amer >60 >60 mL/min   GFR calc Af Amer >60 >60 mL/min    Comment: (NOTE) The eGFR has been calculated using the CKD EPI equation. This calculation has not been validated in all clinical situations. eGFR's persistently <60 mL/min signify possible Chronic Kidney Disease.    Anion gap 6 5 - 15  Magnesium     Status: None   Collection Time: 05/24/16  8:26 AM  Result Value Ref Range   Magnesium 1.7 1.7 - 2.4 mg/dL  Phosphorus     Status: Abnormal   Collection Time: 05/24/16  8:26 AM  Result Value Ref Range   Phosphorus 1.8 (L) 2.5 - 4.6 mg/dL  Procalcitonin     Status: None   Collection Time: 05/24/16  8:26 AM  Result Value Ref Range   Procalcitonin 0.14 ng/mL    Comment:        Interpretation: PCT (Procalcitonin) <= 0.5 ng/mL: Systemic infection (sepsis) is not likely. Local bacterial infection is possible. (NOTE)         ICU PCT Algorithm               Non ICU PCT Algorithm    ----------------------------     ------------------------------         PCT < 0.25 ng/mL                 PCT < 0.1 ng/mL     Stopping of antibiotics            Stopping of antibiotics       strongly encouraged.               strongly encouraged.    ----------------------------     ------------------------------       PCT  level decrease by               PCT < 0.25 ng/mL       >= 80% from peak PCT       OR PCT 0.25 - 0.5 ng/mL          Stopping of antibiotics  encouraged.     Stopping of antibiotics           encouraged.    ----------------------------     ------------------------------       PCT level decrease by              PCT >= 0.25 ng/mL       < 80% from peak PCT        AND PCT >= 0.5 ng/mL            Continuin g antibiotics                                              encouraged.       Continuing antibiotics            encouraged.    ----------------------------     ------------------------------     PCT level increase compared          PCT > 0.5 ng/mL         with peak PCT AND          PCT >= 0.5 ng/mL             Escalation of antibiotics                                          strongly encouraged.      Escalation of antibiotics        strongly encouraged.   CBC     Status: Abnormal   Collection Time: 05/25/16  5:45 AM  Result Value Ref Range   WBC 12.0 (H) 4.0 - 10.5 K/uL   RBC 4.54 3.87 - 5.11 MIL/uL   Hemoglobin 12.2 12.0 - 15.0 g/dL   HCT 38.8 36.0 - 46.0 %   MCV 85.5 78.0 - 100.0 fL   MCH 26.9 26.0 - 34.0 pg   MCHC 31.4 30.0 - 36.0 g/dL   RDW 13.8 11.5 - 15.5 %   Platelets 266 150 - 400 K/uL  Basic metabolic panel     Status: Abnormal   Collection Time: 05/25/16  5:45 AM  Result Value Ref Range   Sodium 140 135 - 145 mmol/L   Potassium 3.6 3.5 - 5.1 mmol/L   Chloride 103 101 - 111 mmol/L   CO2 26 22 - 32 mmol/L   Glucose, Bld 125 (H) 65 - 99 mg/dL   BUN 11 6 - 20 mg/dL   Creatinine, Ser 0.74 0.44 - 1.00 mg/dL   Calcium 9.0 8.9 - 10.3 mg/dL   GFR calc non Af Amer >60 >60 mL/min   GFR calc Af Amer >60 >60 mL/min    Comment: (NOTE) The eGFR has been calculated using the CKD EPI equation. This calculation has not been validated in all clinical situations. eGFR's persistently <60 mL/min signify possible Chronic Kidney Disease.     Anion gap 11 5 - 15  Magnesium     Status: None   Collection Time: 05/25/16  5:45 AM  Result Value Ref Range   Magnesium 1.7 1.7 - 2.4 mg/dL  Phosphorus     Status: Abnormal   Collection Time: 05/25/16  5:45 AM  Result Value Ref Range   Phosphorus 2.3 (L) 2.5 - 4.6 mg/dL  Procalcitonin  Status: None   Collection Time: 05/25/16  5:45 AM  Result Value Ref Range   Procalcitonin 0.11 ng/mL    Comment:        Interpretation: PCT (Procalcitonin) <= 0.5 ng/mL: Systemic infection (sepsis) is not likely. Local bacterial infection is possible. (NOTE)         ICU PCT Algorithm               Non ICU PCT Algorithm    ----------------------------     ------------------------------         PCT < 0.25 ng/mL                 PCT < 0.1 ng/mL     Stopping of antibiotics            Stopping of antibiotics       strongly encouraged.               strongly encouraged.    ----------------------------     ------------------------------       PCT level decrease by               PCT < 0.25 ng/mL       >= 80% from peak PCT       OR PCT 0.25 - 0.5 ng/mL          Stopping of antibiotics                                             encouraged.     Stopping of antibiotics           encouraged.    ----------------------------     ------------------------------       PCT level decrease by              PCT >= 0.25 ng/mL       < 80% from peak PCT        AND PCT >= 0.5 ng/mL            Continuin g antibiotics                                              encouraged.       Continuing antibiotics            encouraged.    ----------------------------     ------------------------------     PCT level increase compared          PCT > 0.5 ng/mL         with peak PCT AND          PCT >= 0.5 ng/mL             Escalation of antibiotics                                          strongly encouraged.      Escalation of antibiotics        strongly encouraged.     Current Facility-Administered Medications  Medication  Dose Route Frequency Provider Last Rate Last Dose  . 0.9 %  sodium chloride infusion   Intravenous Continuous Elza Rafter  Radford Pax, MD 50 mL/hr at 05/24/16 [phone]    . 0.9 %  sodium chloride infusion  250 mL Intravenous PRN Rushil Patel V, MD      . citalopram (CELEXA) tablet 20 mg  20 mg Oral Daily Donita Brooks, NP   20 mg at 05/25/16 0841  . heparin injection 5,000 Units  5,000 Units Subcutaneous Q8H Rushil Sherrye Payor, MD   5,000 Units at 05/25/16 0526  . metoprolol (LOPRESSOR) injection 5 mg  5 mg Intravenous Q3H PRN Janece Canterbury, MD      . naloxone [facility]) injection 1 mg  1 mg Intravenous PRN Carmin Muskrat, MD   1 mg at 05/23/16 1015  . ondansetron (ZOFRAN) injection 4 mg  4 mg Intravenous Q6H PRN Rushil Sherrye Payor, MD   4 mg at 05/25/16 1005    Musculoskeletal: Strength & Muscle Tone: within normal limits Gait & Station: unsteady Patient leans: N/A  Psychiatric Specialty Exam: Physical Exam as per history and physical  ROS generalized weakness, tired, wheezing and sob. Denied chest pain. No Fever-chills, No Headache, No changes with Vision or hearing, reports vertigo No problems swallowing food or Liquids, No Chest pain, Cough or Shortness of Breath, No Abdominal pain, No Nausea or Vommitting, Bowel movements are regular, No Blood in stool or Urine, No dysuria, No new skin rashes or bruises, No new joints pains-aches,  No new weakness, tingling, numbness in any extremity, No recent weight gain or loss, No polyuria, polydypsia or polyphagia,  A full 10 point Review of Systems was done, except as stated above, all other Review of Systems were negative.  Blood pressure (!) 160/119, pulse (!) 120, temperature 98.4 F (36.9 C), temperature source Oral, resp. rate 20, height 5' 5"  (1.651 m), weight 82.4 kg (181 lb 11.2 oz), SpO2 98 %.Body mass index is 30.24 kg/m.  General Appearance: Disheveled and Guarded  Eye Contact:  Good  Speech:  Slow  Volume:  Decreased  Mood:   Anxious, Depressed and Worthless  Affect:  Constricted and Depressed  Thought Process:  Coherent and Goal Directed  Orientation:  Full (Time, Place, and Person)  Thought Content:  Rumination  Suicidal Thoughts:  Yes.  with intent/plan  Homicidal Thoughts:  No  Memory:  Immediate;   Good Recent;   Fair Remote;   Fair  Judgement:  Impaired  Insight:  Fair  Psychomotor Activity:  Decreased  Concentration:  Concentration: Fair and Attention Span: Poor  Recall:  [employer] of Knowledge:  Fair  Language:  Good  Akathisia:  Negative  Handed:  Right  AIMS (if indicated):     Assets:  Communication Skills Desire for Improvement Financial Resources/Insurance Housing Intimacy Leisure Time [place] Talents/Skills Transportation  ADL's:  Impaired  Cognition:  WNL  Sleep:        Treatment Plan Summary: Daily contact with patient to assess and evaluate symptoms and progress in treatment and Medication management   May start home psych medication when medically cleared Appreciate psychiatric consultation and follow up as clinically required Please contact [phone] or [phone] if needs further assistance  Disposition: Recommend psychiatric Inpatient admission when medically cleared.  Ambrose Finland, MD 05/25/2016 12:39 PM

## 2016-05-26 ENCOUNTER — Inpatient Hospital Stay (HOSPITAL_COMMUNITY)
Admission: AD | Admit: 2016-05-26 | Discharge: 2016-05-31 | DRG: 885 | Disposition: A | Discharge status: Home / Self Care | Payer: Medicaid Other | Source: Intra-hospital | Attending: Psychiatry | Admitting: Psychiatry

## 2016-05-26 ENCOUNTER — Encounter (HOSPITAL_COMMUNITY): Payer: Self-pay

## 2016-05-26 DIAGNOSIS — Z8249 Family history of ischemic heart disease and other diseases of the circulatory system: Secondary | ICD-10-CM | POA: Diagnosis not present

## 2016-05-26 DIAGNOSIS — F4310 Post-traumatic stress disorder, unspecified: Secondary | ICD-10-CM | POA: Diagnosis present

## 2016-05-26 DIAGNOSIS — Z8632 Personal history of gestational diabetes: Secondary | ICD-10-CM

## 2016-05-26 DIAGNOSIS — Z6830 Body mass index (BMI) 30.0-30.9, adult: Secondary | ICD-10-CM | POA: Diagnosis not present

## 2016-05-26 DIAGNOSIS — G9340 Encephalopathy, unspecified: Secondary | ICD-10-CM | POA: Diagnosis not present

## 2016-05-26 DIAGNOSIS — G4730 Sleep apnea, unspecified: Secondary | ICD-10-CM | POA: Diagnosis present

## 2016-05-26 DIAGNOSIS — Z91410 Personal history of adult physical and sexual abuse: Secondary | ICD-10-CM

## 2016-05-26 DIAGNOSIS — F332 Major depressive disorder, recurrent severe without psychotic features: Secondary | ICD-10-CM | POA: Diagnosis not present

## 2016-05-26 DIAGNOSIS — T1491XA Suicide attempt, initial encounter: Secondary | ICD-10-CM | POA: Diagnosis not present

## 2016-05-26 DIAGNOSIS — F410 Panic disorder [episodic paroxysmal anxiety] without agoraphobia: Secondary | ICD-10-CM | POA: Diagnosis present

## 2016-05-26 DIAGNOSIS — K219 Gastro-esophageal reflux disease without esophagitis: Secondary | ICD-10-CM

## 2016-05-26 DIAGNOSIS — F4320 Adjustment disorder, unspecified: Secondary | ICD-10-CM | POA: Diagnosis present

## 2016-05-26 DIAGNOSIS — N179 Acute kidney failure, unspecified: Secondary | ICD-10-CM

## 2016-05-26 DIAGNOSIS — G8929 Other chronic pain: Secondary | ICD-10-CM | POA: Diagnosis present

## 2016-05-26 DIAGNOSIS — Z79899 Other long term (current) drug therapy: Secondary | ICD-10-CM | POA: Diagnosis not present

## 2016-05-26 DIAGNOSIS — Z915 Personal history of self-harm: Secondary | ICD-10-CM

## 2016-05-26 DIAGNOSIS — G4700 Insomnia, unspecified: Secondary | ICD-10-CM | POA: Diagnosis present

## 2016-05-26 DIAGNOSIS — G2581 Restless legs syndrome: Secondary | ICD-10-CM | POA: Diagnosis present

## 2016-05-26 DIAGNOSIS — E669 Obesity, unspecified: Secondary | ICD-10-CM | POA: Diagnosis present

## 2016-05-26 DIAGNOSIS — I10 Essential (primary) hypertension: Secondary | ICD-10-CM

## 2016-05-26 DIAGNOSIS — T50901D Poisoning by unspecified drugs, medicaments and biological substances, accidental (unintentional), subsequent encounter: Secondary | ICD-10-CM | POA: Diagnosis not present

## 2016-05-26 DIAGNOSIS — Z888 Allergy status to other drugs, medicaments and biological substances status: Secondary | ICD-10-CM | POA: Diagnosis not present

## 2016-05-26 DIAGNOSIS — F411 Generalized anxiety disorder: Secondary | ICD-10-CM | POA: Diagnosis present

## 2016-05-26 DIAGNOSIS — T424X2A Poisoning by benzodiazepines, intentional self-harm, initial encounter: Secondary | ICD-10-CM | POA: Diagnosis not present

## 2016-05-26 DIAGNOSIS — Z833 Family history of diabetes mellitus: Secondary | ICD-10-CM

## 2016-05-26 DIAGNOSIS — T40602A Poisoning by unspecified narcotics, intentional self-harm, initial encounter: Secondary | ICD-10-CM | POA: Diagnosis not present

## 2016-05-26 LAB — CBC
HEMATOCRIT: 37.3 % (ref 36.0–46.0)
HEMOGLOBIN: 11.8 g/dL — AB (ref 12.0–15.0)
MCH: 26.9 pg (ref 26.0–34.0)
MCHC: 31.6 g/dL (ref 30.0–36.0)
MCV: 85 fL (ref 78.0–100.0)
Platelets: 213 10*3/uL (ref 150–400)
RBC: 4.39 MIL/uL (ref 3.87–5.11)
RDW: 13.9 % (ref 11.5–15.5)
WBC: 7.5 10*3/uL (ref 4.0–10.5)

## 2016-05-26 LAB — BASIC METABOLIC PANEL
ANION GAP: 10 (ref 5–15)
BUN: 11 mg/dL (ref 6–20)
CHLORIDE: 105 mmol/L (ref 101–111)
CO2: 25 mmol/L (ref 22–32)
Calcium: 8.7 mg/dL — ABNORMAL LOW (ref 8.9–10.3)
Creatinine, Ser: 0.73 mg/dL (ref 0.44–1.00)
GFR calc Af Amer: >60 mL/min (ref >60)
GLUCOSE: 95 mg/dL (ref 65–99)
POTASSIUM: 3.7 mmol/L (ref 3.5–5.1)
Sodium: 140 mmol/L (ref 135–145)

## 2016-05-26 LAB — TSH: TSH: 0.561 u[IU]/mL (ref 0.350–4.500)

## 2016-05-26 MED ORDER — ACETAMINOPHEN 325 MG PO TABS
650.0000 mg | ORAL_TABLET | Freq: Four times a day (QID) | ORAL | Status: DC | PRN
  Administered 2016-05-26 – 2016-05-30 (×8): 650 mg via ORAL
  Filled 2016-05-26 (×8): qty 2

## 2016-05-26 MED ORDER — ONDANSETRON 4 MG PO TBDP: 4.0000 mg | ORAL_TABLET | Freq: Four times a day (QID) | ORAL | Status: AC | PRN

## 2016-05-26 MED ORDER — HYDROXYZINE HCL 25 MG PO TABS
25.0000 mg | ORAL_TABLET | Freq: Four times a day (QID) | ORAL | Status: AC | PRN
  Administered 2016-05-27 (×2): 25 mg via ORAL
  Filled 2016-05-26 (×2): qty 1

## 2016-05-26 MED ORDER — CHLORDIAZEPOXIDE HCL 25 MG PO CAPS
25.0000 mg | ORAL_CAPSULE | Freq: Every day | ORAL | Status: AC
  Administered 2016-05-30: 25 mg via ORAL
  Filled 2016-05-26: qty 1

## 2016-05-26 MED ORDER — METOPROLOL TARTRATE 12.5 MG HALF TABLET
12.5000 mg | ORAL_TABLET | Freq: Two times a day (BID) | ORAL | Status: DC
  Administered 2016-05-26 – 2016-05-31 (×10): 12.5 mg via ORAL
  Filled 2016-05-26 (×15): qty 1

## 2016-05-26 MED ORDER — CHLORDIAZEPOXIDE HCL 25 MG PO CAPS
25.0000 mg | ORAL_CAPSULE | ORAL | Status: AC
  Administered 2016-05-29 (×2): 25 mg via ORAL
  Filled 2016-05-26 (×2): qty 1

## 2016-05-26 MED ORDER — CHLORDIAZEPOXIDE HCL 25 MG PO CAPS
25.0000 mg | ORAL_CAPSULE | Freq: Three times a day (TID) | ORAL | Status: AC
  Administered 2016-05-28 (×3): 25 mg via ORAL
  Filled 2016-05-26 (×3): qty 1

## 2016-05-26 MED ORDER — CHLORDIAZEPOXIDE HCL 25 MG PO CAPS
25.0000 mg | ORAL_CAPSULE | Freq: Four times a day (QID) | ORAL | Status: AC
  Administered 2016-05-26 – 2016-05-27 (×6): 25 mg via ORAL
  Filled 2016-05-26 (×6): qty 1

## 2016-05-26 MED ORDER — LISINOPRIL 20 MG PO TABS
20.0000 mg | ORAL_TABLET | Freq: Every day | ORAL | Status: DC
  Administered 2016-05-27 – 2016-05-31 (×5): 20 mg via ORAL
  Filled 2016-05-26 (×8): qty 1

## 2016-05-26 MED ORDER — CITALOPRAM HYDROBROMIDE 20 MG PO TABS
20.0000 mg | ORAL_TABLET | Freq: Every day | ORAL | Status: DC
  Administered 2016-05-27 – 2016-05-31 (×5): 20 mg via ORAL
  Filled 2016-05-26 (×8): qty 1

## 2016-05-26 MED ORDER — ALUM & MAG HYDROXIDE-SIMETH 200-200-20 MG/5ML PO SUSP: 30.0000 mL | ORAL | Status: DC | PRN

## 2016-05-26 MED ORDER — FOLIC ACID 1 MG PO TABS: 1.0000 mg | ORAL_TABLET | Freq: Every day | ORAL | Status: DC

## 2016-05-26 MED ORDER — VITAMIN B-1 100 MG PO TABS
100.0000 mg | ORAL_TABLET | Freq: Every day | ORAL | Status: DC
  Administered 2016-05-27 – 2016-05-31 (×5): 100 mg via ORAL
  Filled 2016-05-26 (×8): qty 1

## 2016-05-26 MED ORDER — MUPIROCIN 2 % EX OINT
TOPICAL_OINTMENT | Freq: Two times a day (BID) | CUTANEOUS | Status: DC
Start: 2016-05-26 — End: 2016-05-31
  Administered 2016-05-26 – 2016-05-30 (×6): via TOPICAL
  Filled 2016-05-26 (×2): qty 22

## 2016-05-26 MED ORDER — TRAZODONE HCL 50 MG PO TABS
50.0000 mg | ORAL_TABLET | Freq: Every evening | ORAL | Status: DC | PRN
  Administered 2016-05-26 – 2016-05-31 (×3): 50 mg via ORAL
  Filled 2016-05-26 (×3): qty 1

## 2016-05-26 MED ORDER — LOPERAMIDE HCL 2 MG PO CAPS
2.0000 mg | ORAL_CAPSULE | ORAL | Status: AC | PRN
  Administered 2016-05-27: 4 mg via ORAL
  Administered 2016-05-29: 2 mg via ORAL
  Administered 2016-05-29: 4 mg via ORAL
  Filled 2016-05-26 (×2): qty 2
  Filled 2016-05-26: qty 1

## 2016-05-26 MED ORDER — CHLORDIAZEPOXIDE HCL 25 MG PO CAPS
25.0000 mg | ORAL_CAPSULE | Freq: Four times a day (QID) | ORAL | Status: AC | PRN
  Administered 2016-05-28: 25 mg via ORAL
  Filled 2016-05-26: qty 1

## 2016-05-26 MED ORDER — MAGNESIUM HYDROXIDE 400 MG/5ML PO SUSP: 30.0000 mL | Freq: Every day | ORAL | Status: DC | PRN

## 2016-05-26 MED ORDER — METOPROLOL TARTRATE 25 MG PO TABS: 12.5000 mg | ORAL_TABLET | Freq: Two times a day (BID) | ORAL | Status: DC

## 2016-05-26 MED ORDER — LISINOPRIL 20 MG PO TABS
20.0000 mg | ORAL_TABLET | Freq: Every day | ORAL | Status: DC
  Administered 2016-05-26: 20 mg via ORAL
  Filled 2016-05-26: qty 1

## 2016-05-26 MED ORDER — HYDROXYZINE HCL 25 MG PO TABS
25.0000 mg | ORAL_TABLET | Freq: Three times a day (TID) | ORAL | Status: DC | PRN
  Administered 2016-05-30: 25 mg via ORAL
  Filled 2016-05-26: qty 1

## 2016-05-26 MED ORDER — THIAMINE HCL 100 MG/ML IJ SOLN: 100.0000 mg | Freq: Once | INTRAMUSCULAR | Status: AC

## 2016-05-26 MED ORDER — HYDROXYZINE HCL 25 MG PO TABS: 25.0000 mg | ORAL_TABLET | Freq: Three times a day (TID) | ORAL | Status: DC | PRN

## 2016-05-26 NOTE — Progress Notes (Signed)
05/26/2016 10:16 AM  Progress Note  Pt was seen and examined.  Pt labs reviewed. VSS.  Pt is medically stable now for transfer to inpatient psychiatric treatment.   See Discharge Summary.   Maryln Manuel, MD

## 2016-05-26 NOTE — BH Assessment (Signed)
Late Entry-Writer received phone call from social worker Customer service manager) about patient in need of psychiatric hospitalization. Information forwarded to [facility] Attending Physician (Pucilowska) & Charge Nurse Dedra Skeens F.).  Writer called to offer bed, social worker states; patient was accepted to [facility].

## 2016-05-26 NOTE — Discharge Summary (Addendum)
Physician Discharge Summary  FIANA GLADU ZOX:[id number] DOB: March 07, 1970 DOA: 05/23/2016  PCP: No PCP Per Patient  Admit date: 05/23/2016 Discharge date: 05/26/2016  Disposition:  Psychiatric inpatient hospitalization  Discharge Condition: MEDICALLY STABLE  CODE STATUS: FULL  Brief/Interim Summary: History of Present Illness: Ms. Wilcoxson is a 46 year old woman with mood disorder and chronic pain who presented to the hospital with acute respiratory failure and acute encephalopathy. Her fiancee [name] was at bedside and provided the history as she was unable to speak.  Yesterday afternoon, she took her normal medication [morphine?] though later in the evening, he found her standing over the sink for an hour while she was supposed to be showering. He laid her down in bed but called EMS this morning when he found her in the same position overnight. She has not expressed any recent suicidal ideation though was hospitalized for 1 week in [place] for a possible overdose about a month ago. 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 She sees outpatient psychiatry and a pain clinic. He is unaware of her having any ongoing tobacco, alcohol, illicit drug use.  Of interest, she complained to him about vaginal bleeding over the last 3 weeks for which she was concerned about a miscarriage. Otherwise, she had no other complaints.  Brief Narrative:   46 y/o F with mood disorder and chronic pain who presented with acute respiratory failure and encephalopathy, admitted 4/8 to ICU.  Required narcan gtt and ICU monitoring. Course complicated by agitated delirium and was placed on precedex briefly. Gtt's weaned off am 4/9.  She remains tachycardic and hypertensive with emotional lability.  Recent hospitalization for overdose approximately one month ago in [place]. Of note, she complained to him about having vaginal bleeding for 3 weeks and she  was concerned she had a miscarriage.    Assessment & Plan:   Principal Problem:   MDD (major depressive disorder), recurrent severe, without psychosis (HCC) Active Problems:   Acute respiratory failure (HCC)   Accidental drug overdose  Overdose, likely intentional. This would have been second overdose in the last 1-2 months. Patient with mood disorder, anxiety, depression. 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  She has also recently filled prescriptions for nucynta and embeda.   -  CIWA for benzo withdrawal  -  Continue to hold narcotics -  Start beta blocker as below -  Do not suspect PE -  TSH WNL  AKI, likely related to hypovolemia, resolved with IVF - d/c IVF  -  Minimize nephrotoxins  HTN - hold off on home ace inhibitor due to recent AKI -  Add metoprolol 12.5mg  po BID - prn lopressor.   Hypomagnesemia  -  Magnesium sulfate 2gm IV was given with good results  Vesicular rash in between fingers/around nailbeds -  Unclear etiology.  Do not have a red base.  Patient picking at hand.  Not on palm and unilateral -  bactroban and dry dressing to opened ulcer.  DVT prophylaxis:  lovenox Code Status:  full Family Communication:  Patient Disposition Plan:  Inpatient psychiatric facility  Consultants:   Psychiatry  PCCM   Discharge Diagnoses:  Principal Problem:   MDD (major depressive disorder), recurrent severe, without psychosis (HCC) Active Problems:   GERD (gastroesophageal reflux disease)   Acute  encephalopathy   Acute respiratory failure (HCC)   Altered mental status   AKI (acute kidney injury) (HCC)   Accidental drug overdose   Essential hypertension  Discharge Instructions  Discharge Instructions    Diet - low sodium heart healthy    Complete by:  As directed    Increase activity slowly    Complete by:  As directed      Allergies as of 05/26/2016      Reactions   Iodides Hives   Imitrex [sumatriptan]    Pt states she takes imitrex injection-"sometimes it works and sometimes doesn't"   Toradol [ketorolac Tromethamine] Hives   Metoclopramide Hcl Anxiety      Medication List    STOP taking these medications   baclofen 10 MG tablet Commonly known as:  LIORESAL   busPIRone 30 MG tablet Commonly known as:  BUSPAR   Dexlansoprazole 30 MG capsule   EMBEDA 50-2 MG Cpcr Generic drug:  Morphine-Naltrexone   famotidine 20 MG tablet Commonly known as:  PEPCID   NUCYNTA 100 MG Tabs Generic drug:  Tapentadol HCl   ondansetron 4 MG disintegrating tablet Commonly known as:  ZOFRAN ODT   potassium chloride SA 20 MEQ tablet Commonly known as:  K-DUR,KLOR-CON   promethazine 25 MG suppository Commonly known as:  PHENERGAN     TAKE these medications   citalopram 20 MG tablet Commonly known as:  CELEXA Take 20 mg by mouth daily.   folic acid 1 MG tablet Commonly known as:  FOLVITE Take 1 tablet (1 mg total) by mouth daily.   lisinopril 20 MG tablet Commonly known as:  PRINIVIL,ZESTRIL Take 1 tablet (20 mg total) by mouth daily.   metoprolol tartrate 25 MG tablet Commonly known as:  LOPRESSOR Take 0.5 tablets (12.5 mg total) by mouth 2 (two) times daily.   mirtazapine 45 MG tablet Commonly known as:  REMERON Take 45 mg by mouth at bedtime.   multivitamin with minerals Tabs tablet Take 1 tablet by mouth daily.   pregabalin 200 MG capsule Commonly known as:  LYRICA Take 200 mg by mouth 2 (two) times daily.   zolpidem 10 MG tablet Commonly known as:   AMBIEN Take 10 mg by mouth at bedtime.       Allergies  Allergen Reactions  . Iodides Hives  . Imitrex [Sumatriptan]     Pt states she takes imitrex injection-"sometimes it works and sometimes doesn't"  . Toradol [Ketorolac Tromethamine] Hives  . Metoclopramide Hcl Anxiety   Procedures/Studies: Ct Head Wo Contrast  Result Date: 05/23/2016 CLINICAL DATA:  Unresponsive EXAM: CT HEAD WITHOUT CONTRAST TECHNIQUE: Contiguous axial images were obtained from the base of the skull through the vertex without intravenous contrast. COMPARISON:  07/15/2015 FINDINGS: Brain: No evidence of acute infarction, hemorrhage, hydrocephalus, extra-axial collection or mass lesion/mass effect. Normal brain volume. Symmetric gray-white differentiation which is normal for this scanner. Vascular: No hyperdense vessel or unexpected calcification. Skull: Normal. Negative for fracture or focal lesion. Sinuses/Orbits: Negative.  Nasal trumpet in place. IMPRESSION: Negative head CT. Electronically Signed   By: Marnee Spring M.D.   On: 05/23/2016 11:00   Dg Chest Port 1 View  Result Date: 05/24/2016 CLINICAL DATA:  Dyspnea, acute respiratory  failure, atrial fibrillation, hypertension EXAM: PORTABLE CHEST 1 VIEW COMPARISON:  Portable chest x-ray of May 23, 2016 FINDINGS: The lungs are mildly hypoinflated. There is no focal infiltrate. There is no pleural effusion. The cardiac silhouette is enlarged. The central pulmonary vascularity is mildly prominent. There is no interstitial or alveolar edema. The trachea is midline. The bony thorax exhibits no acute abnormality. IMPRESSION: Mild hypoinflation. Stable mild cardiomegaly without pulmonary edema. Electronically Signed   By: David  [place] M.D.   On: 05/24/2016 07:30   Dg Chest Port 1 View  Result Date: 05/23/2016 CLINICAL DATA:  Overdose. Hx of afib, hypertension, sleep apnea, tubal ligation. Nonsmoker. EXAM: PORTABLE CHEST 1 VIEW COMPARISON:  Chest x-rays dated 03/24/2016 and  03/13/2016. FINDINGS: Study is limited by low lung volumes and lordotic patient positioning. Given these limitations, lungs are grossly clear. Heart size and mediastinal contours are likely stable. No pleural effusion or pneumothorax seen. No acute or suspicious osseous finding. IMPRESSION: Limited exam due to lordotic patient positioning. No evidence of acute cardiopulmonary abnormality. Electronically Signed   By: Bary Richard M.D.   On: 05/23/2016 09:54    (Echo, Carotid, EGD, Colonoscopy, ERCP)    Subjective: Pt wants to have foley removed otherwise no complaints.   Discharge Exam: Vitals:   05/26/16 0730 05/26/16 1000  BP: (!) 155/90 (!) 156/109  Pulse: (!) 109 (!) 108  Resp: 18   Temp: 97.8 F (36.6 C)    Vitals:   05/25/16 2117 05/26/16 0500 05/26/16 0730 05/26/16 1000  BP: (!) 144/95 (!) 149/92 (!) 155/90 (!) 156/109  Pulse: 88 91 (!) 109 (!) 108  Resp: 20 18 18    Temp: 98.1 F (36.7 C) 98.4 F (36.9 C) 97.8 F (36.6 C)   TempSrc: Oral Oral Oral   SpO2: 96% 100% 100%   Weight:  83.2 kg (183 lb 6.4 oz)    Height:        General: Pt is alert, awake, not in acute distress Cardiovascular: nl S1/S2 +, no rubs, no gallops Respiratory: CTA bilaterally, no wheezing, no rhonchi Abdominal: Soft, NT, ND, bowel sounds + Extremities: no edema, no cyanosis   The results of significant diagnostics from this hospitalization (including imaging, microbiology, ancillary and laboratory) are listed below for reference.     Microbiology: Recent Results (from the past 240 hour(s))  MRSA PCR Screening     Status: None   Collection Time: 05/23/16  5:05 PM  Result Value Ref Range Status   MRSA by PCR NEGATIVE NEGATIVE Final    Comment:        The GeneXpert MRSA Assay (FDA approved for NASAL specimens only), is one component of a comprehensive MRSA colonization surveillance program. It is not intended to diagnose MRSA infection nor to guide or monitor treatment for MRSA  infections.   Culture, blood (Routine X 2) w Reflex to ID Panel     Status: None (Preliminary result)   Collection Time: 05/23/16  5:45 PM  Result Value Ref Range Status   Specimen Description BLOOD LEFT ANTECUBITAL  Final   Special Requests   Final    BOTTLES DRAWN AEROBIC AND ANAEROBIC Blood Culture adequate volume   Culture NO GROWTH 3 DAYS  Final   Report Status PENDING  Incomplete  Culture, blood (routine x 2)     Status: None (Preliminary result)   Collection Time: 05/23/16  6:01 PM  Result Value Ref Range Status   Specimen Description BLOOD RIGHT HAND  Final   Special Requests  Final    BOTTLES DRAWN AEROBIC AND ANAEROBIC Blood Culture adequate volume   Culture NO GROWTH 3 DAYS  Final   Report Status PENDING  Incomplete     Labs: BNP (last 3 results) No results for input(s): BNP in the last 8760 hours. Basic Metabolic Panel:  Recent Labs Lab 05/23/16 1255 05/23/16 1754 05/24/16 0826 05/25/16 0545 05/26/16 0423  NA 145 143 139 140 140  K 5.2* 4.4 3.8 3.6 3.7  CL 112* 109 106 103 105  CO2 GLUCOSE 200* 135* 146* 125* 95  BUN 25* 23* CREATININE 2.32* 1.83* 0.83 0.74 0.73  CALCIUM 7.8* 7.9* 8.6* 9.0 8.7*  MG  --   --  1.7 1.7  --   PHOS  --   --  1.8* 2.3*  --    Liver Function Tests:  Recent Labs Lab 05/23/16 0954  AST 69*  ALT 32  ALKPHOS 43  BILITOT 0.3  PROT 6.5  ALBUMIN 3.2*    Recent Labs Lab 05/23/16 0954  LIPASE 20   No results for input(s): AMMONIA in the last 168 hours. CBC:  Recent Labs Lab 05/23/16 0954 05/24/16 0826 05/25/16 0545 05/26/16 0423  WBC 10.0 9.2 12.0* 7.5  NEUTROABS 8.1*  --   --   --   HGB 11.4* 11.0* 12.2 11.8*  HCT 38.6 36.2 38.8 37.3  MCV 91.0 87.7 85.5 85.0  PLT 204 197 266 213   Cardiac Enzymes: No results for input(s): CKTOTAL, CKMB, CKMBINDEX, TROPONINI in the last 168 hours. BNP: Invalid input(s): POCBNP CBG: No results for input(s): GLUCAP in the last 168  hours. D-Dimer No results for input(s): DDIMER in the last 72 hours. Hgb A1c  Recent Labs  05/23/16 1754  HGBA1C 5.6   Lipid Profile No results for input(s): CHOL, HDL, LDLCALC, TRIG, CHOLHDL, LDLDIRECT in the last 72 hours. Thyroid function studies  Recent Labs  05/26/16 0423  TSH 0.561   Anemia work up No results for input(s): VITAMINB12, FOLATE, FERRITIN, TIBC, IRON, RETICCTPCT in the last 72 hours. Urinalysis    Component Value Date/Time   COLORURINE AMBER (A) 05/23/2016 0912   APPEARANCEUR CLOUDY (A) 05/23/2016 0912   LABSPEC 1.017 05/23/2016 0912   PHURINE 5.0 05/23/2016 0912   GLUCOSEU NEGATIVE 05/23/2016 0912   HGBUR LARGE (A) 05/23/2016 0912   BILIRUBINUR NEGATIVE 05/23/2016 0912   KETONESUR NEGATIVE 05/23/2016 0912   PROTEINUR 100 (A) 05/23/2016 0912   UROBILINOGEN 0.2 04/10/2014 2229   NITRITE NEGATIVE 05/23/2016 0912   LEUKOCYTESUR MODERATE (A) 05/23/2016 0912   Sepsis Labs Invalid input(s): PROCALCITONIN,  WBC,  LACTICIDVEN Microbiology Recent Results (from the past 240 hour(s))  MRSA PCR Screening     Status: None   Collection Time: 05/23/16  5:05 PM  Result Value Ref Range Status   MRSA by PCR NEGATIVE NEGATIVE Final    Comment:        The GeneXpert MRSA Assay (FDA approved for NASAL specimens only), is one component of a comprehensive MRSA colonization surveillance program. It is not intended to diagnose MRSA infection nor to guide or monitor treatment for MRSA infections.   Culture, blood (Routine X 2) w Reflex to ID Panel     Status: None (Preliminary result)   Collection Time: 05/23/16  5:45 PM  Result Value Ref Range Status   Specimen Description BLOOD LEFT ANTECUBITAL  Final   Special Requests   Final    BOTTLES DRAWN AEROBIC AND  ANAEROBIC Blood Culture adequate volume   Culture NO GROWTH 3 DAYS  Final   Report Status PENDING  Incomplete  Culture, blood (routine x 2)     Status: None (Preliminary result)   Collection Time: 05/23/16   6:01 PM  Result Value Ref Range Status   Specimen Description BLOOD RIGHT HAND  Final   Special Requests   Final    BOTTLES DRAWN AEROBIC AND ANAEROBIC Blood Culture adequate volume   Culture NO GROWTH 3 DAYS  Final   Report Status PENDING  Incomplete   Time coordinating discharge: 33 minutes  SIGNED:  Standley Dakins, MD  [facility] 05/26/2016, 10:23 AM Pager [phone]  Please page for medical sign out if desired  If 7PM-7AM, please contact night-coverage [email or web address] Password [email or web address]
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
## 2016-05-26 NOTE — Clinical Social Work Note (Signed)
CSW received consult for psychiatric placement today. Referrals made to [facility] and [facility]. Voluntary admission form explained and signed by Ms. Terrien. Form faxed to [facility]. Patient was discharge to [facility], transported by [employer] transportation.   Genelle Bal, MSW, LCSW Licensed Clinical Social Worker Clinical Social Work Department [employer] [phone]

## 2016-05-26 NOTE — Progress Notes (Signed)
Sandra [name] to be D/C'd [facility] per MD order.  Discussed prescriptions and follow up appointments with the patient. Prescriptions given to patient, medication list explained in detail. Pt verbalized understanding.  Allergies as of 05/26/2016      Reactions   Iodides Hives   Imitrex [sumatriptan]    Pt states she takes imitrex injection-"sometimes it works and sometimes doesn't"   Toradol [ketorolac Tromethamine] Hives   Metoclopramide Hcl Anxiety      Medication List    STOP taking these medications   baclofen 10 MG tablet Commonly known as:  LIORESAL   busPIRone 30 MG tablet Commonly known as:  BUSPAR   Dexlansoprazole 30 MG capsule   EMBEDA 50-2 MG Cpcr Generic drug:  Morphine-Naltrexone   famotidine 20 MG tablet Commonly known as:  PEPCID   NUCYNTA 100 MG Tabs Generic drug:  Tapentadol HCl   ondansetron 4 MG disintegrating tablet Commonly known as:  ZOFRAN ODT   potassium chloride SA 20 MEQ tablet Commonly known as:  K-DUR,KLOR-CON   promethazine 25 MG suppository Commonly known as:  PHENERGAN     TAKE these medications   citalopram 20 MG tablet Commonly known as:  CELEXA Take 20 mg by mouth daily.   folic acid 1 MG tablet Commonly known as:  FOLVITE Take 1 tablet (1 mg total) by mouth daily.   lisinopril 20 MG tablet Commonly known as:  PRINIVIL,ZESTRIL Take 1 tablet (20 mg total) by mouth daily.   metoprolol tartrate 25 MG tablet Commonly known as:  LOPRESSOR Take 0.5 tablets (12.5 mg total) by mouth 2 (two) times daily.   mirtazapine 45 MG tablet Commonly known as:  REMERON Take 45 mg by mouth at bedtime.   multivitamin with minerals Tabs tablet Take 1 tablet by mouth daily.   pregabalin 200 MG capsule Commonly known as:  LYRICA Take 200 mg by mouth 2 (two) times daily.   zolpidem 10 MG tablet Commonly known as:  AMBIEN Take 10 mg by mouth at bedtime.       Vitals:   05/26/16 1351 05/26/16 1602  BP: 136/84 (!) 147/104  Pulse: 98 98   Resp:  18  Temp:  98.3 F (36.8 C)    Skin clean, dry and intact without evidence of skin break down, no evidence of skin tears noted. IV catheter discontinued intact. Site without signs and symptoms of complications. Dressing and pressure applied. Pt denies pain at this time. No complaints noted.  An After Visit Summary was printed and given to the patient. Patient escorted via WC, and D/C to [facility] via transport.  Britt Bolognese RN, BSN

## 2016-05-26 NOTE — Tx Team (Addendum)
Initial Treatment Plan 05/26/2016 7:22 PM Sandra [name] [id number]    PATIENT STRESSORS: Financial difficulties Health problems Loss of job and pregnancy   PATIENT STRENGTHS: Ability for insight Average or above average intelligence Capable of independent living   PATIENT IDENTIFIED PROBLEMS: Depression  Risk for self harm    "coping with chronic pain"  "Dealing with depression"             DISCHARGE CRITERIA:  Improved stabilization in mood, thinking, and/or behavior  PRELIMINARY DISCHARGE PLAN: Outpatient therapy  PATIENT/FAMILY INVOLVEMENT: This treatment plan has been presented to and reviewed with the patient, [name].  The patient and family have been given the opportunity to ask questions and make suggestions.  Maurine Simmering, RN 05/26/2016, 7:22 PM

## 2016-05-26 NOTE — Progress Notes (Signed)
Pt is new to the unit this afternoon.  She reports she is getting used to being here.  She presents disheveled, in scrubs, with her hair dirty and poor hygiene.  She complains of jaw pain as well as other somatic complaints at the beginning of this shift.  She was given Tylenol 650 mg for a pain 8/10.  She denies SI/HI/AVH at this time.  Pt hs been out in the milieu, but with minimal interaction with her peers.  She attended evening AA group.  Pt was encouraged to make her needs known to staff.  She was given a prn for sleep and is on Librium protocol.  Support and encouragement offered.  Discharge plans are in process.  Safety maintained with q15 minute checks.

## 2016-05-27 DIAGNOSIS — T40602A Poisoning by unspecified narcotics, intentional self-harm, initial encounter: Secondary | ICD-10-CM

## 2016-05-27 DIAGNOSIS — T424X2A Poisoning by benzodiazepines, intentional self-harm, initial encounter: Secondary | ICD-10-CM

## 2016-05-27 DIAGNOSIS — Z79899 Other long term (current) drug therapy: Secondary | ICD-10-CM

## 2016-05-27 DIAGNOSIS — T1491XA Suicide attempt, initial encounter: Secondary | ICD-10-CM

## 2016-05-27 MED ORDER — MIRTAZAPINE 15 MG PO TABS
15.0000 mg | ORAL_TABLET | Freq: Every day | ORAL | Status: DC
  Administered 2016-05-27 – 2016-05-30 (×4): 15 mg via ORAL
  Filled 2016-05-27 (×6): qty 1
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
## 2016-05-27 NOTE — Progress Notes (Signed)
[facility] team made aware that pt is in the hospital. They will try to see her in the hospital today.  Trula Slade, MSW, LCSW Clinical Social Worker 05/27/2016 11:01 AM

## 2016-05-27 NOTE — [facility] Suicide Risk Assessment (Addendum)
[facility] INPATIENT:  Family/Significant Other Suicide Prevention Education  Suicide Prevention Education:  Contact Attempts:  Irving Shows [phone]) has been identified by the patient as the family member/significant other with whom the patient will be residing, and identified as the person(s) who will aid the patient in the event of a mental health crisis.  With written consent from the patient, two attempts were made to provide suicide prevention education, prior to and/or following the patient's discharge.  We were unsuccessful in providing suicide prevention education.  A suicide education pamphlet was given to the patient to share with family/significant other.  Date and time of first attempt: 05/27/16 at 11:05AM (voicemail left requesting call back).  Date and time of second attempt: 05/31/16 at 8:45AM  [employer] LCSW 05/27/2016, 11:07 AM

## 2016-05-27 NOTE — Progress Notes (Signed)
D: Patient denies SI/HI or AVH. Patient has a flat affect and depressed mood but is cooperative, appropriate and pleasant with staff and others on the unit.  She states that despite medication for sleep last night, that "when the fan kicked on I woke up and couldn't go back to sleep", she also reports that her appetite has been fair to poor at times.  Pt. Reports "chronic abdominal issues" and is cautious about eating too much, states she tends to have loose bowels.  Pt. States that her goal for today is "to leave", she describes her energy level as "normal" and her concentration as "good".   A: Patient given emotional support from RN. Patient encouraged to come to staff with concerns and/or questions. Patient's medication routine continued. Patient's orders and plan of care reviewed.   R: Patient remains appropriate and cooperative. Will continue to monitor patient q15 minutes for safety.

## 2016-05-27 NOTE — Social Work (Signed)
Referred to [facility], is [place] resident.  Anne Cunningham, LCSW Lead Clinical Social Worker Phone:  [phone]  
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
## 2016-05-27 NOTE — Tx Team (Signed)
Interdisciplinary Treatment and Diagnostic Plan Update  05/27/2016 Time of Session: 0930 Sandra [name] MRN: [id number]  Principal Diagnosis: MDD  Secondary Diagnoses: Active Problems:   MDD (major depressive disorder), recurrent severe, without psychosis (HCC)   Current Medications:  Current Facility-Administered Medications  Medication Dose Route Frequency Provider Last Rate Last Dose  . acetaminophen (TYLENOL) tablet 650 mg  650 mg Oral Q6H PRN Laveda Abbe, NP   650 mg at 05/26/16 2000  . alum & mag hydroxide-simeth (MAALOX/MYLANTA) 200-200-20 MG/5ML suspension 30 mL  30 mL Oral Q4H PRN Laveda Abbe, NP      . chlordiazePOXIDE (LIBRIUM) capsule 25 mg  25 mg Oral Q6H PRN Laveda Abbe, NP      . chlordiazePOXIDE (LIBRIUM) capsule 25 mg  25 mg Oral QID Laveda Abbe, NP   25 mg at 05/27/16 0805   Followed by  . [START ON 05/28/2016] chlordiazePOXIDE (LIBRIUM) capsule 25 mg  25 mg Oral TID Laveda Abbe, NP       Followed by  . [START ON 05/29/2016] chlordiazePOXIDE (LIBRIUM) capsule 25 mg  25 mg Oral BH-qamhs Laveda Abbe, NP       Followed by  . [START ON 05/30/2016] chlordiazePOXIDE (LIBRIUM) capsule 25 mg  25 mg Oral Daily Laveda Abbe, NP      . citalopram (CELEXA) tablet 20 mg  20 mg Oral Daily Laveda Abbe, NP   20 mg at 05/27/16 0805  . hydrOXYzine (ATARAX/VISTARIL) tablet 25 mg  25 mg Oral Q6H PRN Laveda Abbe, NP   25 mg at 05/27/16 0865  . [START ON 05/30/2016] hydrOXYzine (ATARAX/VISTARIL) tablet 25 mg  25 mg Oral TID PRN [name], MD      . lisinopril (PRINIVIL,ZESTRIL) tablet 20 mg  20 mg Oral Daily Laveda Abbe, NP   20 mg at 05/27/16 0805  . loperamide (IMODIUM) capsule 2-4 mg  2-4 mg Oral PRN Laveda Abbe, NP   4 mg at 05/27/16 0806  . magnesium hydroxide (MILK OF MAGNESIA) suspension 30 mL  30 mL Oral Daily PRN Laveda Abbe, NP      . metoprolol tartrate (LOPRESSOR)  tablet 12.5 mg  12.5 mg Oral BID Laveda Abbe, NP   12.5 mg at 05/26/16 1957  . mupirocin ointment (BACTROBAN) 2 %   Topical BID Laveda Abbe, NP      . ondansetron (ZOFRAN-ODT) disintegrating tablet 4 mg  4 mg Oral Q6H PRN Laveda Abbe, NP      . thiamine (VITAMIN B-1) tablet 100 mg  100 mg Oral Daily Laveda Abbe, NP   100 mg at 05/27/16 0805  . traZODone (DESYREL) tablet 50 mg  50 mg Oral QHS PRN Laveda Abbe, NP   50 mg at 05/26/16 2146   PTA Medications: Prescriptions Prior to Admission  Medication Sig Dispense Refill Last Dose  . citalopram (CELEXA) 20 MG tablet Take 20 mg by mouth daily.   Not Taking at Unknown time  . folic acid (FOLVITE) 1 MG tablet Take 1 tablet (1 mg total) by mouth daily.     Marland Kitchen lisinopril (PRINIVIL,ZESTRIL) 20 MG tablet Take 1 tablet (20 mg total) by mouth daily. 90 tablet 0   . metoprolol tartrate (LOPRESSOR) 25 MG tablet Take 0.5 tablets (12.5 mg total) by mouth 2 (two) times daily.     . mirtazapine (REMERON) 45 MG tablet Take 45 mg by mouth at bedtime.  Not Taking at Unknown time  . Multiple Vitamin (MULTIVITAMIN WITH MINERALS) TABS tablet Take 1 tablet by mouth daily.   Not Taking at Unknown time  . pregabalin (LYRICA) 200 MG capsule Take 200 mg by mouth 2 (two) times daily.    05/06/2016 at Unknown time  . zolpidem (AMBIEN) 10 MG tablet Take 10 mg by mouth at bedtime.   Not Taking at Unknown time    Patient Stressors: Financial difficulties Health problems Loss of job and pregnancy  Patient Strengths: Ability for insight Average or above average intelligence Capable of independent living  Treatment Modalities: Medication Management, Group therapy, Case management,  1 to 1 session with clinician, Psychoeducation, Recreational therapy.   Physician Treatment Plan for Primary Diagnosis:MDD  Long Term Goal(s):     Short Term Goals:    Medication Management: Evaluate patient's response, side effects, and  tolerance of medication regimen.  Therapeutic Interventions: 1 to 1 sessions, Unit Group sessions and Medication administration.  Evaluation of Outcomes: Progressing  Physician Treatment Plan for Secondary Diagnosis: Active Problems:   MDD (major depressive disorder), recurrent severe, without psychosis (HCC)        Medication Management: Evaluate patient's response, side effects, and tolerance of medication regimen.  Therapeutic Interventions: 1 to 1 sessions, Unit Group sessions and Medication administration.  Evaluation of Outcomes: Progressing   RN Treatment Plan for Primary Diagnosis: MDD Long Term Goal(s): Knowledge of disease and therapeutic regimen to maintain health will improve  Short Term Goals: Ability to remain free from injury will improve, Ability to verbalize feelings will improve and Ability to disclose and discuss suicidal ideas  Medication Management: RN will administer medications as ordered by provider, will assess and evaluate patient's response and provide education to patient for prescribed medication. RN will report any adverse and/or side effects to prescribing provider.  Therapeutic Interventions: 1 on 1 counseling sessions, Psychoeducation, Medication administration, Evaluate responses to treatment, Monitor vital signs and CBGs as ordered, Perform/monitor CIWA, COWS, AIMS and Fall Risk screenings as ordered, Perform wound care treatments as ordered.  Evaluation of Outcomes: Progressing   LCSW Treatment Plan for Primary Diagnosis:MDD  Long Term Goal(s): Safe transition to appropriate next level of care at discharge, Engage patient in therapeutic group addressing interpersonal concerns.  Short Term Goals: Engage patient in aftercare planning with referrals and resources, Facilitate patient progression through stages of change regarding substance use diagnoses and concerns and Identify triggers associated with mental health/substance abuse  issues  Therapeutic Interventions: Assess for all discharge needs, 1 to 1 time with Social worker, Explore available resources and support systems, Assess for adequacy in community support network, Educate family and significant other(s) on suicide prevention, Complete Psychosocial Assessment, Interpersonal group therapy.  Evaluation of Outcomes: Progressing   Progress in Treatment: Attending groups: Yes Participating in groups: Yes Taking medication as prescribed: Yes. Toleration medication: Yes. Family/Significant other contact made: No, will contact:  fiance if patient consents Patient understands diagnosis: Yes. Discussing patient identified problems/goals with staff: Yes. Medical problems stabilized or resolved: Yes. Denies suicidal/homicidal ideation: Yes. Issues/concerns per patient self-inventory: No. Other: n/a   New problem(s) identified: No, Describe:  n/a  New Short Term/Long Term Goal(s): medication stabilization; development of comprehensive mental wellness/sobriety plan, elimination of SI thoughts; mood regulation.   Discharge Plan or Barriers: CSW assessing for appropriate referrals. Pt is patient of [facility] and gave permission for CSW to reach out to them. Pt does not want records sent to her pain clinic.   Reason for  Continuation of Hospitalization: Anxiety Depression Medication stabilization Suicidal ideation Withdrawal symptoms  Estimated Length of Stay: 3-5 days   Attendees: Patient: 05/27/2016 8:25 AM  Physician: Dr. Jackquline Berlin MD 05/27/2016 8:25 AM  Nursing: Doristine Section RN 05/27/2016 8:25 AM  RN Care Manager: Onnie Boer CM 05/27/2016 8:25 AM  Social Worker: Chartered loss adjuster, LCSW 05/27/2016 8:25 AM  Recreational Therapist:  Juliann Pares 05/27/2016 8:25 AM  Other: Armandina Stammer NP; Gray Bernhardt NP 05/27/2016 8:25 AM  Other:  05/27/2016 8:25 AM  Other: 05/27/2016 8:25 AM    Scribe for Treatment Team: Ledell Peoples Smart, LCSW 05/27/2016 8:25 AM

## 2016-05-27 NOTE — [facility] Group Notes (Signed)
[facility] LCSW Group Therapy  05/27/2016 12:05 PM  Type of Therapy:  Group Therapy  Participation Level:  Active  Participation Quality:  Attentive  Affect:  Appropriate  Cognitive:  Oriented  Insight:  Improving  Engagement in Therapy:  Improving  Modes of Intervention:  Confrontation, Discussion, Education, Problem-solving, Socialization and Support  Summary of Progress/Problems: Self Sabotage: Group Members were asked to identify the meaning of self sabotage and how this applies in their lives. Group members were asked to explore what types of self sabotage are getting in their way and processed plans for change in order to address these obstacles.   Brytney Somes N Smart LCSW 05/27/2016, 12:05 PM

## 2016-05-28 LAB — CULTURE, BLOOD (ROUTINE X 2)
CULTURE: NO GROWTH
Culture: NO GROWTH
SPECIAL REQUESTS: ADEQUATE
Special Requests: ADEQUATE

## 2016-05-28 NOTE — Progress Notes (Signed)
Patient ID: Toy Care, female   DOB: April 01, 1970, 46 y.o.   MRN: [id number]   D: Patient has a flat affect on approach tonight. Reports some improvement in mood. Contracts for safety tonight. Attending groups. Librium protocol. Patient reports was taking klonopin.  A: Staff will monitor on q 15 minute checks and follow medication/ treatment orders. R: Cooperative on the unit.

## 2016-05-28 NOTE — Progress Notes (Signed)
Sandra [name] from ACT Team visited patient today.

## 2016-05-28 NOTE — [facility] Group Notes (Signed)
[facility] LCSW Group Therapy  05/28/2016 3:08 PM  Type of Therapy:  Group Therapy  Participation Level:  Minimal  Participation Quality:  Attentive  Affect:  Flat  Cognitive:  Oriented  Insight:  Improving  Engagement in Therapy:  Improving  Modes of Intervention:  Confrontation, Discussion, Education, Problem-solving, Socialization and Support  Summary of Progress/Problems: Feelings around Relapse. Group members discussed the meaning of relapse and shared personal stories of relapse, how it affected them and others, and how they perceived themselves during this time. Group members were encouraged to identify triggers, warning signs and coping skills used when facing the possibility of relapse. Social supports were discussed and explored in detail.   Nakiyah Beverley N Smart LCSW 05/28/2016, 3:08 PM

## 2016-05-28 NOTE — Progress Notes (Signed)
D:  Patient's self inventory sheet, patient has fair sleep, sleep medication not helpful.  Fair appetite, normal energy level, good concentration.  Rated depression 1, denied hopeless, anxiety 2.  Denied withdrawals.  Denied SI.  Physical problems, pain, head, worst pain #4 in past 24 hours, head and back.  No pain medication.  Goal is myself.  Ready to go. A:  Medications administered per MD orders.  Emotional support and encouragement given patient. R:  Denied SI and HI, contracts for safety.  Denied A/V hallucinations.  Safety maintained with 15 minute checks. Tylenol was given for headache morning.  Patient stated she was feeling better afterwards.

## 2016-05-28 NOTE — [facility] Group Notes (Signed)
[facility] LCSW Aftercare Discharge Planning Group Note   05/28/2016 11:46 AM  Participation Quality:  Invited, did not attend  Sallee Lange

## 2016-05-28 NOTE — Progress Notes (Signed)
Date:  05/28/2016 Time:  1:09 AM  Group Topic/Focus:  Wrap-Up Group:   The focus of this group is to help patients review their daily goal of treatment and discuss progress on daily workbooks.  Participation Level:  Active  Participation Quality:  Appropriate and Attentive  Affect:  Appropriate  Cognitive:  Appropriate  Insight: Appropriate  Engagement in Group:  Engaged  Modes of Intervention:  Discussion  Additional Comments:  Pt stated she is still new and getting used to the unit. Pt stated she met with the doctor today. Pt stated something positive is that she is glad to have good support here in the hospital.  Clint Bolder 05/28/2016, 1:09 AM

## 2016-05-28 NOTE — Plan of Care (Signed)
Problem: Education: Goal: Utilization of techniques to improve thought processes will improve Outcome: Progressing Nurse discussed depression/anxiety/coping skills with patient.    

## 2016-05-28 NOTE — [facility] Group Notes (Signed)
[facility] LCSW Aftercare Discharge Planning Group Note   05/28/2016 12:41 PM  Participation Quality:  Invited, chose not to attend   Sallee Lange

## 2016-05-28 NOTE — Progress Notes (Signed)
Pt attended the evening AA speaker meeting. Pt was engaged and appropriate. Aracely Rickett C, NT 05/28/16 10:00 PM  

## 2016-05-28 NOTE — Progress Notes (Signed)
[facility] MD Progress Note  05/28/2016 2:24 PM Sandra [name]  MRN:  [id number] Subjective:   46 yo Caucasian female, separated, lives with her boyfriend. History of MDD recurrent and chronic pain. Recently treated at the ICU for AMS following intentional OD. Was in acute respiratory failure at presentation. Reported to have been under multiple stressors lately. Had a miscarriage a month ago.  Has not been able to see her children for over a year.  Lost her job three days before the OD.  Nursing staff reports that she has filed a 72 hour notice. Feels that she is ready to go. Has not discussed or explored how she got here with staff or at group. Denies being suicidal. Not expressing any suicidal thoughts. She has been cooperative and pleasant.   Seen today. Chart reviewed prior to interview. Says she was hoping to get out of hospital this weekend. Says she was told by her boyfriend that she has gotten her job back. Patient says her 60 yo daughter is coming into town today. Says she is feeling good. Patient denies any current suicidal thoughts. She denies any thoughts of violence. No evidence of psychosis.  Principal Problem: <principal problem not specified> Diagnosis:   Patient Active Problem List   Diagnosis Date Noted  . Essential hypertension [I10] 05/26/2016  . Accidental drug overdose [T50.901A]   . Opiate overdose [T40.601A]   . Sepsis (HCC) [A41.9] 03/13/2016  . Pyelonephritis [N12] 03/13/2016  . Depression [F32.9] 03/13/2016  . AKI (acute kidney injury) (HCC) [N17.9] 03/13/2016  . Hypokalemia [E87.6] 03/13/2016  . MDD (major depressive disorder), recurrent severe, without psychosis (HCC) [F33.2] 11/15/2014    Class: Chronic  . Major depressive disorder, single episode, severe without psychosis (HCC) [F32.2] 11/15/2014  . Altered mental status [R41.82] 01/15/2014  . Acute encephalopathy [G93.40] 02/13/2013  . Acute respiratory failure (HCC) [J96.00] 02/13/2013  . Aspiration pneumonia  (HCC) [J69.0] 02/13/2013  . Lumbar spondylosis [M47.816] 04/21/2011  . Trochanteric bursitis [M70.60] 04/21/2011  . Myofascial pain [M79.1] 04/21/2011  . TACHYCARDIA [R00.0] 03/06/2010  . DYSPNEA [R06.02] 01/06/2010  . RESTLESS LEG SYNDROME [G25.81] 12/11/2009  . MIGRAINE HEADACHE [G43.909] 12/11/2009  . GERD (gastroesophageal reflux disease) [K21.9] 12/11/2009  . Gastroparesis [K31.84] 12/11/2009  . DIABETES MELLITUS, GESTATIONAL, HX OF [Z87.42] 12/11/2009   Total Time spent with patient: 30 minutes  Past Psychiatric History: As in H&P   Past Medical History:  Past Medical History:  Diagnosis Date  . Anemia    a. Mild with Hgb 11.2, 09/2012. UA with mod Hgb. Instructed to f/u PCP.  Marland Kitchen Anxiety   . Anxiety   . Atrial fibrillation with RVR (HCC)    a. Dx 09/2012, spont conv to NSR.  Marland Kitchen Chronic lower back pain   . Depression   . Gastroparesis   . GERD (gastroesophageal reflux disease)   . Gestational diabetes   . H/O echocardiogram    a. 02/2010: done for htn/tachy while pregnant - EF 55-60%, grade 1 d/dysf, mildly dilated LA.  . High cholesterol   . Hypertension   . Kidney stones   . Migraines   . Obesity   . Panic attack   . Restless legs   . Sleep apnea     Past Surgical History:  Procedure Laterality Date  . BACK SURGERY    . CESAREAN SECTION  2012  . CYSTOSCOPY W/ URETERAL STENT PLACEMENT  2006  . CYSTOSCOPY/RETROGRADE/URETEROSCOPY/STONE EXTRACTION WITH BASKET  1987  . DILATION AND CURETTAGE OF UTERUS  2006; 2010  .  TUBAL LIGATION  2012   Family History:  Family History  Problem Relation Age of Onset  . Diabetes Mother   . Hypertension Mother   . Migraines Father   . Migraines Brother    Family Psychiatric  History: As in H&P Social History:  History  Alcohol Use No     History  Drug Use No    Social History   Social History  . Marital status: Legally Separated    Spouse name: N/A  . Number of children: N/A  . Years of education: N/A   Social  History Main Topics  . Smoking status: Never Smoker  . Smokeless tobacco: Never Used  . Alcohol use No  . Drug use: No  . Sexual activity: Not Asked   Other Topics Concern  . None   Social History Narrative   Separated, 6 children   Right handed   2 yr college   3-4 cans of soda         Additional Social History:                         Sleep: Good  Appetite:  Good  Current Medications: Current Facility-Administered Medications  Medication Dose Route Frequency Provider Last Rate Last Dose  . acetaminophen (TYLENOL) tablet 650 mg  650 mg Oral Q6H PRN Laveda Abbe, NP   650 mg at 05/28/16 0817  . alum & mag hydroxide-simeth (MAALOX/MYLANTA) 200-200-20 MG/5ML suspension 30 mL  30 mL Oral Q4H PRN Laveda Abbe, NP      . chlordiazePOXIDE (LIBRIUM) capsule 25 mg  25 mg Oral Q6H PRN Laveda Abbe, NP      . chlordiazePOXIDE (LIBRIUM) capsule 25 mg  25 mg Oral TID Laveda Abbe, NP   25 mg at 05/28/16 1219   Followed by  . [START ON 05/29/2016] chlordiazePOXIDE (LIBRIUM) capsule 25 mg  25 mg Oral BH-qamhs Laveda Abbe, NP       Followed by  . [START ON 05/30/2016] chlordiazePOXIDE (LIBRIUM) capsule 25 mg  25 mg Oral Daily Laveda Abbe, NP      . citalopram (CELEXA) tablet 20 mg  20 mg Oral Daily Laveda Abbe, NP   20 mg at 05/28/16 0811  . hydrOXYzine (ATARAX/VISTARIL) tablet 25 mg  25 mg Oral Q6H PRN Laveda Abbe, NP   25 mg at 05/27/16 2306  . [START ON 05/30/2016] hydrOXYzine (ATARAX/VISTARIL) tablet 25 mg  25 mg Oral TID PRN [name], MD      . lisinopril (PRINIVIL,ZESTRIL) tablet 20 mg  20 mg Oral Daily Laveda Abbe, NP   20 mg at 05/28/16 0811  . loperamide (IMODIUM) capsule 2-4 mg  2-4 mg Oral PRN Laveda Abbe, NP   4 mg at 05/27/16 0806  . magnesium hydroxide (MILK OF MAGNESIA) suspension 30 mL  30 mL Oral Daily PRN Laveda Abbe, NP      . metoprolol tartrate (LOPRESSOR)  tablet 12.5 mg  12.5 mg Oral BID Laveda Abbe, NP   12.5 mg at 05/28/16 0810  . mirtazapine (REMERON) tablet 15 mg  15 mg Oral QHS Georgiann Cocker, MD   15 mg at 05/27/16 2134  . mupirocin ointment (BACTROBAN) 2 %   Topical BID Laveda Abbe, NP      . ondansetron (ZOFRAN-ODT) disintegrating tablet 4 mg  4 mg Oral Q6H PRN Laveda Abbe, NP      .  thiamine (VITAMIN B-1) tablet 100 mg  100 mg Oral Daily Laveda Abbe, NP   100 mg at 05/28/16 1610  . traZODone (DESYREL) tablet 50 mg  50 mg Oral QHS PRN Laveda Abbe, NP   50 mg at 05/27/16 2306    Lab Results: No results found for this or any previous visit (from the past 48 hour(s)).  Blood Alcohol level:  Lab Results  Component Value Date   ETH <5 05/23/2016   ETH <5 05/06/2016    Metabolic Disorder Labs: Lab Results  Component Value Date   HGBA1C 5.6 05/23/2016   MPG 114 05/23/2016   MPG 123 (H) 10/04/2012   No results found for: PROLACTIN Lab Results  Component Value Date   TRIG 573 (H) 02/13/2013    Physical Findings: AIMS: Facial and Oral Movements Muscles of Facial Expression: None, normal Lips and Perioral Area: None, normal Jaw: None, normal Tongue: None, normal,Extremity Movements Upper (arms, wrists, hands, fingers): None, normal Lower (legs, knees, ankles, toes): None, normal, Trunk Movements Neck, shoulders, hips: None, normal, Overall Severity Severity of abnormal movements (highest score from questions above): None, normal Incapacitation due to abnormal movements: None, normal Patient's awareness of abnormal movements (rate only patient's report): No Awareness, Dental Status Current problems with teeth and/or dentures?: No Does patient usually wear dentures?: No  CIWA:  CIWA-Ar Total: 1 COWS:     Musculoskeletal: Strength & Muscle Tone: within normal limits Gait & Station: normal Patient leans: N/A  Psychiatric Specialty Exam: Physical Exam  Constitutional: She  is oriented to person, place, and time. She appears well-developed and well-nourished.  HENT:  Head: Normocephalic and atraumatic.  Eyes: Conjunctivae are normal. Pupils are equal, round, and reactive to light.  Neck: Normal range of motion. Neck supple.  Cardiovascular: Normal rate, regular rhythm and normal heart sounds.   Respiratory: Effort normal and breath sounds normal.  GI: Soft. Bowel sounds are normal.  Musculoskeletal: Normal range of motion.  Neurological: She is alert and oriented to person, place, and time. She has normal reflexes.  Skin: Skin is warm and dry.  Psychiatric:  As above.    ROS  Blood pressure (!) 135/99, pulse 94, temperature 98.1 F (36.7 C), temperature source Oral, resp. rate 18, height  (1.626 m), weight 81.6 kg (180 lb), last menstrual period 04/28/2016, SpO2 100 %.Body mass index is 30.9 kg/m.  General Appearance: Neatly dressed, earlier today, was at the day room with peers. Good relatedness. Appropriate.   Eye Contact:  Good  Speech:  Clear and Coherent  Volume:  Normal  Mood:  Feeling better   Affect:  Appropriate and Restricted  Thought Process:  Linear  Orientation:  Full (Time, Place, and Person)  Thought Content:  Future oriented. No delusional theme. No preoccupation with violent thoughts. No negative ruminations. No obsession.  No hallucination in any modality.   Suicidal Thoughts:  No  Homicidal Thoughts:  No  Memory:  Immediate;   Good Recent;   Good Remote;   Good  Judgement:  Fair  Insight:  Good  Psychomotor Activity:  Normal  Concentration:  Concentration: Good and Attention Span: Good  Recall:  Good  Fund of Knowledge:  Good  Language:  Good  Akathisia:  No  Handed:    AIMS (if indicated):     Assets:  Communication Skills Desire for Improvement Housing Intimacy Physical Health  ADL's:  Intact  Cognition:  WNL  Sleep:  Number of Hours: 6.25  Treatment Plan Summary:  Depression is lifting. Patient is more  future oriented. We need collaborative information from her boyfriend. We would evaluate her further.   Psychiatric: MDD recurrent Adjustment disorder unspecified.   Medical: Chronic pain GERD  Psychosocial:  Separation Loss of custody  Unemployed  PLAN: 1. Continue current regimen 2. Continue to monitor mood, behavior and interaction with peers 3. Collateral from her boyfriend  Georgiann Cocker, MD 05/28/2016, 2:24 PM

## 2016-05-28 NOTE — [facility] Group Notes (Signed)
Pt participated in Psychoeducational group. Pt discussed their relapse prevention plan. During this group pt's discussed behaviors they would like to change. Pt's discussed their triggers, warning signs of relapse behavior, and coping skills they can use to prevent relapse.  

## 2016-05-28 NOTE — Progress Notes (Signed)
Recreation Therapy Notes  Date: 05/28/16 Time: 0930 Location: [address] Dayroom  Group Topic: Stress Management  Goal Area(s) Addresses:  Patient will verbalize importance of using healthy stress management.  Patient will identify positive emotions associated with healthy stress management.   Behavioral Response: Engaged  Intervention: Stress Management  Activity :  [place].  LRT introduced the stress management technique of guided imagery.  LRT read a script to allow patients to engage in the activity.  Patients were to follow along as LRT read the script to participate in activity.  Education:  Stress Management, Discharge Planning.   Education Outcome: Acknowledges edcuation/In group clarification offered/Needs additional education  Clinical Observations/Feedback: Pt attended group.   Jheremy Boger, LRT/CTRS         Gabreille Dardis A 05/28/2016 11:56 AM 

## 2016-05-29 NOTE — Progress Notes (Signed)
D  Pt endorses depression and anxiety   She complained about her lack of sleep since she has been here     She interacts appropriately but minimally with others   She attends and participates in groups A  Verbal support and encouragement given    Medications administered and effectiveness monitored   Q 15 min checks R    Pt is safe at present time

## 2016-05-29 NOTE — Progress Notes (Addendum)
Sandra [name] is seen OOB UAL on the [address] hall today...she tolerates this fairly well. She is somewhat agitated when she approaches this Clinical research associate and says " when is my 72 hr going to be up?". Upon investigation, this writer does not find a 72 hour request for dc in pt's chart. Pt is adamant that " I signed one when I was across the street and one as soon as I got here". Writer explained to pt that " across the street  Is the [facility] ED and , again, there is no 72 hour document on pt's chart. Pt given the 72 hour request for discharge per pt' request  and writer witnessed pt signing, at [phone] ( it will expire 06/01/2016 0850) and Clinical research associate explained to pt  Same. Pt adamant that she be discharged today and writer pointed out that her temulousness ( in her hands) is possibly a sign that pt cont to be in active withdrawal and pt responded " they always shake". Pt completed   Her daily assessment and on It  she wrote she dendied SI today and she  Rated her depression, opelessness and anxiety " 0/0/2", respectively. R Safety in place and poc cont. To be in place.

## 2016-05-29 NOTE — [facility] Group Notes (Signed)
Adult Therapy Group Note (Clinical Social Work)  Date:  05/29/2016  Time:  11:00AM-12:00PM  Group Topic/Focus: Unhealthy vs Healthy Coping Techniques  Building Self Esteem:    The focus of this group was to determine what unhealthy coping techniques typically are used by various patients in the room and what healthy coping techniques would be more helpful instead.  The whiteboard was used to record responses, and a group discussion was then held about the reasons people tend to use the unhealthy skills more often and more readily than the healthy ones.  The effort that would be needed to learn and to use healthier coping techniques was discussed with an emphasis on the potential positive outcome.  Participation Level:  Active  Participation Quality:  Appropriate, Attentive and Sharing  Affect:  Anxious and Blunted  Cognitive:  Appropriate  Insight: Good  Engagement in Group:  Engaged  Modes of Intervention:  Exercise, Discussion and Support  Additional Comments:  The patient expressed herself a few times during group, although she was not as outspoken as some.  She did add her suggestions to the list developed by the group, and expressed a willingness to learn new healthier coping skills.  Ambrose Mantle, LCSW 05/29/2016   1:08 PM

## 2016-05-29 NOTE — Progress Notes (Signed)
[facility] MD Progress Note  05/29/2016 1:38 PM Sandra [name]  MRN:  [id number] Subjective:   46 yo Caucasian female, separated, lives with her boyfriend. History of MDD recurrent and chronic pain. Recently treated at the ICU for AMS following intentional OD. Was in acute respiratory failure at presentation. Reported to have been under multiple stressors lately. Had a miscarriage a month ago.  Has not been able to see her children for over a year.  Lost her job three days before the OD.  Seen today. Chart reviewed prior to interview. Focused on going home soon. Says she is no suicidal. Plays down last suicidal attempt as accidental. Says she could not remember what she did. Denies any current suicidal thoughts. No evidence of psychosis. No evidence of mania. Says she has good energy levels. Says she is able to think clearly. She has been attending groups.  Nursing staff reports that she has been pleasant. Not withdrawn. She has been maintaining normal biological functions. No behavioral issues.   Principal Problem: MDD recurrent  Diagnosis:   Patient Active Problem List   Diagnosis Date Noted  . Essential hypertension [I10] 05/26/2016  . Accidental drug overdose [T50.901A]   . Opiate overdose [T40.601A]   . Sepsis (HCC) [A41.9] 03/13/2016  . Pyelonephritis [N12] 03/13/2016  . Depression [F32.9] 03/13/2016  . AKI (acute kidney injury) (HCC) [N17.9] 03/13/2016  . Hypokalemia [E87.6] 03/13/2016  . MDD (major depressive disorder), recurrent severe, without psychosis (HCC) [F33.2] 11/15/2014    Class: Chronic  . Major depressive disorder, single episode, severe without psychosis (HCC) [F32.2] 11/15/2014  . Altered mental status [R41.82] 01/15/2014  . Acute encephalopathy [G93.40] 02/13/2013  . Acute respiratory failure (HCC) [J96.00] 02/13/2013  . Aspiration pneumonia (HCC) [J69.0] 02/13/2013  . Lumbar spondylosis [M47.816] 04/21/2011  . Trochanteric bursitis [M70.60] 04/21/2011  . Myofascial  pain [M79.1] 04/21/2011  . TACHYCARDIA [R00.0] 03/06/2010  . DYSPNEA [R06.02] 01/06/2010  . RESTLESS LEG SYNDROME [G25.81] 12/11/2009  . MIGRAINE HEADACHE [G43.909] 12/11/2009  . GERD (gastroesophageal reflux disease) [K21.9] 12/11/2009  . Gastroparesis [K31.84] 12/11/2009  . DIABETES MELLITUS, GESTATIONAL, HX OF [Z87.42] 12/11/2009   Total Time spent with patient: 30 minutes  Past Psychiatric History: As in H&P   Past Medical History:  Past Medical History:  Diagnosis Date  . Anemia    a. Mild with Hgb 11.2, 09/2012. UA with mod Hgb. Instructed to f/u PCP.  Marland Kitchen Anxiety   . Anxiety   . Atrial fibrillation with RVR (HCC)    a. Dx 09/2012, spont conv to NSR.  Marland Kitchen Chronic lower back pain   . Depression   . Gastroparesis   . GERD (gastroesophageal reflux disease)   . Gestational diabetes   . H/O echocardiogram    a. 02/2010: done for htn/tachy while pregnant - EF 55-60%, grade 1 d/dysf, mildly dilated LA.  . High cholesterol   . Hypertension   . Kidney stones   . Migraines   . Obesity   . Panic attack   . Restless legs   . Sleep apnea     Past Surgical History:  Procedure Laterality Date  . BACK SURGERY    . CESAREAN SECTION  2012  . CYSTOSCOPY W/ URETERAL STENT PLACEMENT  2006  . CYSTOSCOPY/RETROGRADE/URETEROSCOPY/STONE EXTRACTION WITH BASKET  1987  . DILATION AND CURETTAGE OF UTERUS  2006; 2010  . TUBAL LIGATION  2012   Family History:  Family History  Problem Relation Age of Onset  . Diabetes Mother   . Hypertension Mother   .  Migraines Father   . Migraines Brother    Family Psychiatric  History: As in H&P Social History:  History  Alcohol Use No     History  Drug Use No    Social History   Social History  . Marital status: Legally Separated    Spouse name: N/A  . Number of children: N/A  . Years of education: N/A   Social History Main Topics  . Smoking status: Never Smoker  . Smokeless tobacco: Never Used  . Alcohol use No  . Drug use: No  .  Sexual activity: Not Asked   Other Topics Concern  . None   Social History Narrative   Separated, 6 children   Right handed   2 yr college   3-4 cans of soda         Additional Social History:   Sleep: Good  Appetite:  Good  Current Medications: Current Facility-Administered Medications  Medication Dose Route Frequency Provider Last Rate Last Dose  . acetaminophen (TYLENOL) tablet 650 mg  650 mg Oral Q6H PRN Laveda Abbe, NP   650 mg at 05/28/16 1722  . alum & mag hydroxide-simeth (MAALOX/MYLANTA) 200-200-20 MG/5ML suspension 30 mL  30 mL Oral Q4H PRN Laveda Abbe, NP      . chlordiazePOXIDE (LIBRIUM) capsule 25 mg  25 mg Oral Q6H PRN Laveda Abbe, NP   25 mg at 05/28/16 2141  . chlordiazePOXIDE (LIBRIUM) capsule 25 mg  25 mg Oral BH-qamhs Laveda Abbe, NP   25 mg at 05/29/16 0817   Followed by  . [START ON 05/30/2016] chlordiazePOXIDE (LIBRIUM) capsule 25 mg  25 mg Oral Daily Laveda Abbe, NP      . citalopram (CELEXA) tablet 20 mg  20 mg Oral Daily Laveda Abbe, NP   20 mg at 05/29/16 0815  . hydrOXYzine (ATARAX/VISTARIL) tablet 25 mg  25 mg Oral Q6H PRN Laveda Abbe, NP   25 mg at 05/27/16 2306  . [START ON 05/30/2016] hydrOXYzine (ATARAX/VISTARIL) tablet 25 mg  25 mg Oral TID PRN [name], MD      . lisinopril (PRINIVIL,ZESTRIL) tablet 20 mg  20 mg Oral Daily Laveda Abbe, NP   20 mg at 05/29/16 0815  . loperamide (IMODIUM) capsule 2-4 mg  2-4 mg Oral PRN Laveda Abbe, NP   4 mg at 05/29/16 1257  . magnesium hydroxide (MILK OF MAGNESIA) suspension 30 mL  30 mL Oral Daily PRN Laveda Abbe, NP      . metoprolol tartrate (LOPRESSOR) tablet 12.5 mg  12.5 mg Oral BID Laveda Abbe, NP   12.5 mg at 05/29/16 0814  . mirtazapine (REMERON) tablet 15 mg  15 mg Oral QHS Georgiann Cocker, MD   15 mg at 05/28/16 2140  . mupirocin ointment (BACTROBAN) 2 %   Topical BID Laveda Abbe, NP       . ondansetron (ZOFRAN-ODT) disintegrating tablet 4 mg  4 mg Oral Q6H PRN Laveda Abbe, NP      . thiamine (VITAMIN B-1) tablet 100 mg  100 mg Oral Daily Laveda Abbe, NP   100 mg at 05/29/16 0814  . traZODone (DESYREL) tablet 50 mg  50 mg Oral QHS PRN Laveda Abbe, NP   50 mg at 05/27/16 2306    Lab Results: No results found for this or any previous visit (from the past 48 hour(s)).  Blood Alcohol level:  Lab Results  Component  Value Date   ETH <5 05/23/2016   ETH <5 05/06/2016    Metabolic Disorder Labs: Lab Results  Component Value Date   HGBA1C 5.6 05/23/2016   MPG 114 05/23/2016   MPG 123 (H) 10/04/2012   No results found for: PROLACTIN Lab Results  Component Value Date   TRIG 573 (H) 02/13/2013    Physical Findings: AIMS: Facial and Oral Movements Muscles of Facial Expression: None, normal Lips and Perioral Area: None, normal Jaw: None, normal Tongue: None, normal,Extremity Movements Upper (arms, wrists, hands, fingers): None, normal Lower (legs, knees, ankles, toes): None, normal, Trunk Movements Neck, shoulders, hips: None, normal, Overall Severity Severity of abnormal movements (highest score from questions above): None, normal Incapacitation due to abnormal movements: None, normal Patient's awareness of abnormal movements (rate only patient's report): No Awareness, Dental Status Current problems with teeth and/or dentures?: No Does patient usually wear dentures?: No  CIWA:  CIWA-Ar Total: 1 COWS:  COWS Total Score: 2  Musculoskeletal: Strength & Muscle Tone: within normal limits Gait & Station: normal Patient leans: N/A  Psychiatric Specialty Exam: Physical Exam  Constitutional: She is oriented to person, place, and time. She appears well-developed and well-nourished.  HENT:  Head: Normocephalic and atraumatic.  Eyes: Conjunctivae are normal. Pupils are equal, round, and reactive to light.  Neck: Normal range of motion. Neck  supple.  Cardiovascular: Normal rate, regular rhythm and normal heart sounds.   Respiratory: Effort normal and breath sounds normal.  GI: Soft. Bowel sounds are normal.  Musculoskeletal: Normal range of motion.  Neurological: She is alert and oriented to person, place, and time. She has normal reflexes.  Skin: Skin is warm and dry.  Psychiatric:  As above.    ROS  Blood pressure (!) 151/107, pulse 93, temperature 98.4 F (36.9 C), temperature source Oral, resp. rate 18, height  (1.626 m), weight 81.6 kg (180 lb), last menstrual period 04/28/2016, SpO2 100 %.Body mass index is 30.9 kg/m.  General Appearance: Calm and cooperative. Good relatedness. Not internally distracted.   Eye Contact:  Good  Speech:  Clear and Coherent  Volume:  Normal  Mood:  Euthymic  Affect:  Appropriate and Restricted  Thought Process:  Linear  Orientation:  Full (Time, Place, and Person)  Thought Content:  Future oriented. No delusional theme. No preoccupation with violent thoughts. No negative ruminations. No obsession.  No hallucination in any modality.   Suicidal Thoughts:  No  Homicidal Thoughts:  No  Memory:  Immediate;   Good Recent;   Good Remote;   Good  Judgement:  Fair  Insight:  Good  Psychomotor Activity:  Normal  Concentration:  Concentration: Good and Attention Span: Good  Recall:  Good  Fund of Knowledge:  Good  Language:  Good  Akathisia:  No  Handed:    AIMS (if indicated):     Assets:  Communication Skills Desire for Improvement Housing Intimacy Physical Health  ADL's:  Intact  Cognition:  WNL  Sleep:  Number of Hours: 6.25     Treatment Plan Summary:  Depression is lifting. Patient is more future oriented. We are awaiting collaborative information from her boyfriend.   Psychiatric: MDD recurrent Adjustment disorder unspecified.   Medical: Chronic pain GERD  Psychosocial:  Separation Loss of custody  Unemployed  PLAN: 1. Continue current regimen 2.  Continue to monitor mood, behavior and interaction with peers 3. Collateral from her boyfriend  Georgiann Cocker, MD 05/29/2016, 1:38 PMPatient ID: Sandra [name], female  DOB: 1970-12-29, 46 y.o.   MRN: [id number]

## 2016-05-29 NOTE — Progress Notes (Signed)
[facility] Group Notes:  (Nursing/MHT/Case Management/Adjunct)  Date:  05/29/2016  Time: 2100 Type of Therapy:  wrap up group  Participation Level:  Minimal  Participation Quality:  Appropriate and Attentive  Affect:  Flat  Cognitive:  Appropriate  Insight:  Lacking  Engagement in Group:  Limited  Modes of Intervention:  Clarification, Education and Support  Summary of Progress/Problems: PT shared one good thing about the day is that she woke up. Pt reports having been down but gives no details of her depression symptoms and shares she will continue to go to [facility] for her mental health services. Pt shared she was grateful for her boyfriend.   Marcille Buffy 05/29/2016, 10:38 PM

## 2016-05-30 NOTE — Plan of Care (Signed)
Problem: Coping: Goal: Ability to cope will improve Outcome: Progressing Nurse discussed depression/anxiety/coping skills with patient.    

## 2016-05-30 NOTE — Progress Notes (Signed)
Adult Psychoeducational Group Note  Date:  05/30/2016 Time:  9:37 PM  Group Topic/Focus:  Wrap-Up Group:   The focus of this group is to help patients review their daily goal of treatment and discuss progress on daily workbooks.  Participation Level:  Minimal  Participation Quality:  Appropriate  Affect:  Flat  Cognitive:  Appropriate  Insight: Appropriate  Engagement in Group:  Engaged  Modes of Intervention:  Socialization and Support  Additional Comments:  Patient attended and participated in group tonight. She reports that today she has been watching television, she had visitors, she attended groups and went for meals. She also saw her doctor.   Lita Mains [facility] 05/30/2016, 9:37 PM

## 2016-05-30 NOTE — [facility] Group Notes (Signed)

## 2016-05-30 NOTE — Progress Notes (Signed)
[facility] MD Progress Note  05/30/2016 2:59 PM Sandra [name]  MRN:  [id number] Subjective:   46 yo Caucasian female, separated, lives with her boyfriend. History of MDD recurrent and chronic pain. Recently treated at the ICU for AMS following intentional OD. Was in acute respiratory failure at presentation. Reported to have been under multiple stressors lately. Had a miscarriage a month ago.  Has not been able to see her children for over a year.  Lost her job three days before the OD.  Seen today. Chart reviewed prior to interview. Patient states that she is feeling normal again and that she is not depressed. Says she is participating in activities. Says she doing well and hopes to be home soon. She denies any lingering suicidal thoughts. Says she has been sleeping normally, she is eating well and has good energy. No evidence of psychosis. No evidence of mania. She has consented that we speak with her boyfriend. Says he is supportive.  Nursing staff reports that patient has been appropriate on the unit. Patient has been interacting well with peers. No behavioral issues. Patient has not voiced any suicidal thoughts. Patient has not been observed to be internally stimulated. Patient has been adherent with treatment recommendations. Patient has been tolerating their medication well.   Principal Problem: MDD recurrent  Diagnosis:   Patient Active Problem List   Diagnosis Date Noted  . Essential hypertension [I10] 05/26/2016  . Accidental drug overdose [T50.901A]   . Opiate overdose [T40.601A]   . Sepsis (HCC) [A41.9] 03/13/2016  . Pyelonephritis [N12] 03/13/2016  . Depression [F32.9] 03/13/2016  . AKI (acute kidney injury) (HCC) [N17.9] 03/13/2016  . Hypokalemia [E87.6] 03/13/2016  . MDD (major depressive disorder), recurrent severe, without psychosis (HCC) [F33.2] 11/15/2014    Class: Chronic  . Major depressive disorder, single episode, severe without psychosis (HCC) [F32.2] 11/15/2014  .  Altered mental status [R41.82] 01/15/2014  . Acute encephalopathy [G93.40] 02/13/2013  . Acute respiratory failure (HCC) [J96.00] 02/13/2013  . Aspiration pneumonia (HCC) [J69.0] 02/13/2013  . Lumbar spondylosis [M47.816] 04/21/2011  . Trochanteric bursitis [M70.60] 04/21/2011  . Myofascial pain [M79.1] 04/21/2011  . TACHYCARDIA [R00.0] 03/06/2010  . DYSPNEA [R06.02] 01/06/2010  . RESTLESS LEG SYNDROME [G25.81] 12/11/2009  . MIGRAINE HEADACHE [G43.909] 12/11/2009  . GERD (gastroesophageal reflux disease) [K21.9] 12/11/2009  . Gastroparesis [K31.84] 12/11/2009  . DIABETES MELLITUS, GESTATIONAL, HX OF [Z87.42] 12/11/2009   Total Time spent with patient: 30 minutes  Past Psychiatric History: As in H&P   Past Medical History:  Past Medical History:  Diagnosis Date  . Anemia    a. Mild with Hgb 11.2, 09/2012. UA with mod Hgb. Instructed to f/u PCP.  Marland Kitchen Anxiety   . Anxiety   . Atrial fibrillation with RVR (HCC)    a. Dx 09/2012, spont conv to NSR.  Marland Kitchen Chronic lower back pain   . Depression   . Gastroparesis   . GERD (gastroesophageal reflux disease)   . Gestational diabetes   . H/O echocardiogram    a. 02/2010: done for htn/tachy while pregnant - EF 55-60%, grade 1 d/dysf, mildly dilated LA.  . High cholesterol   . Hypertension   . Kidney stones   . Migraines   . Obesity   . Panic attack   . Restless legs   . Sleep apnea     Past Surgical History:  Procedure Laterality Date  . BACK SURGERY    . CESAREAN SECTION  2012  . CYSTOSCOPY W/ URETERAL STENT PLACEMENT  2006  .  CYSTOSCOPY/RETROGRADE/URETEROSCOPY/STONE EXTRACTION WITH BASKET  1987  . DILATION AND CURETTAGE OF UTERUS  2006; 2010  . TUBAL LIGATION  2012   Family History:  Family History  Problem Relation Age of Onset  . Diabetes Mother   . Hypertension Mother   . Migraines Father   . Migraines Brother    Family Psychiatric  History: As in H&P Social History:  History  Alcohol Use No     History  Drug Use No     Social History   Social History  . Marital status: Legally Separated    Spouse name: N/A  . Number of children: N/A  . Years of education: N/A   Social History Main Topics  . Smoking status: Never Smoker  . Smokeless tobacco: Never Used  . Alcohol use No  . Drug use: No  . Sexual activity: Not Asked   Other Topics Concern  . None   Social History Narrative   Separated, 6 children   Right handed   2 yr college   3-4 cans of soda         Additional Social History:   Sleep: Good  Appetite:  Good  Current Medications: Current Facility-Administered Medications  Medication Dose Route Frequency Provider Last Rate Last Dose  . acetaminophen (TYLENOL) tablet 650 mg  650 mg Oral Q6H PRN Laveda Abbe, NP   650 mg at 05/30/16 0810  . alum & mag hydroxide-simeth (MAALOX/MYLANTA) 200-200-20 MG/5ML suspension 30 mL  30 mL Oral Q4H PRN Laveda Abbe, NP      . citalopram (CELEXA) tablet 20 mg  20 mg Oral Daily Laveda Abbe, NP   20 mg at 05/30/16 0806  . hydrOXYzine (ATARAX/VISTARIL) tablet 25 mg  25 mg Oral TID PRN [name], MD   25 mg at 05/30/16 0953  . lisinopril (PRINIVIL,ZESTRIL) tablet 20 mg  20 mg Oral Daily Laveda Abbe, NP   20 mg at 05/30/16 0806  . magnesium hydroxide (MILK OF MAGNESIA) suspension 30 mL  30 mL Oral Daily PRN Laveda Abbe, NP      . metoprolol tartrate (LOPRESSOR) tablet 12.5 mg  12.5 mg Oral BID Laveda Abbe, NP   12.5 mg at 05/30/16 0807  . mirtazapine (REMERON) tablet 15 mg  15 mg Oral QHS Georgiann Cocker, MD   15 mg at 05/29/16 2123  . mupirocin ointment (BACTROBAN) 2 %   Topical BID Laveda Abbe, NP      . thiamine (VITAMIN B-1) tablet 100 mg  100 mg Oral Daily Laveda Abbe, NP   100 mg at 05/30/16 1610  . traZODone (DESYREL) tablet 50 mg  50 mg Oral QHS PRN Laveda Abbe, NP   50 mg at 05/27/16 2306    Lab Results: No results found for this or any previous visit  (from the past 48 hour(s)).  Blood Alcohol level:  Lab Results  Component Value Date   ETH <5 05/23/2016   ETH <5 05/06/2016    Metabolic Disorder Labs: Lab Results  Component Value Date   HGBA1C 5.6 05/23/2016   MPG 114 05/23/2016   MPG 123 (H) 10/04/2012   No results found for: PROLACTIN Lab Results  Component Value Date   TRIG 573 (H) 02/13/2013    Physical Findings: AIMS: Facial and Oral Movements Muscles of Facial Expression: None, normal Lips and Perioral Area: None, normal Jaw: None, normal Tongue: None, normal,Extremity Movements Upper (arms, wrists, hands, fingers): None, normal Lower (  legs, knees, ankles, toes): None, normal, Trunk Movements Neck, shoulders, hips: None, normal, Overall Severity Severity of abnormal movements (highest score from questions above): None, normal Incapacitation due to abnormal movements: None, normal Patient's awareness of abnormal movements (rate only patient's report): No Awareness, Dental Status Current problems with teeth and/or dentures?: No Does patient usually wear dentures?: No  CIWA:  CIWA-Ar Total: 1 COWS:  COWS Total Score: 1  Musculoskeletal: Strength & Muscle Tone: within normal limits Gait & Station: normal Patient leans: N/A  Psychiatric Specialty Exam: Physical Exam  Constitutional: She is oriented to person, place, and time. She appears well-developed and well-nourished.  HENT:  Head: Normocephalic and atraumatic.  Eyes: Conjunctivae are normal. Pupils are equal, round, and reactive to light.  Neck: Normal range of motion. Neck supple.  Cardiovascular: Normal rate, regular rhythm and normal heart sounds.   Respiratory: Effort normal and breath sounds normal.  GI: Soft. Bowel sounds are normal.  Musculoskeletal: Normal range of motion.  Neurological: She is alert and oriented to person, place, and time. She has normal reflexes.  Skin: Skin is warm and dry.  Psychiatric:  As above.    ROS  Blood  pressure 131/70, pulse 77, temperature 98.1 F (36.7 C), temperature source Oral, resp. rate 16, height  (1.626 m), weight 81.6 kg (180 lb), last menstrual period 04/28/2016, SpO2 100 %.Body mass index is 30.9 kg/m.  General Appearance: Neatly dressed, pleasant, engaging well and cooperative. Appropriate behavior. Not in any distress. Good relatedness. Not internally stimulated  Eye Contact:  Good  Speech:  Clear and Coherent  Volume:  Normal  Mood:  Euthymic  Affect:  Appropriate and Restricted  Thought Process:  Linear  Orientation:  Full (Time, Place, and Person)  Thought Content:  Future oriented. No delusional theme. No preoccupation with violent thoughts. No negative ruminations. No obsession.  No hallucination in any modality.   Suicidal Thoughts:  No  Homicidal Thoughts:  No  Memory:  Immediate;   Good Recent;   Good Remote;   Good  Judgement:  Fair  Insight:  Good  Psychomotor Activity:  Normal  Concentration:  Concentration: Good and Attention Span: Good  Recall:  Good  Fund of Knowledge:  Good  Language:  Good  Akathisia:  No  Handed:    AIMS (if indicated):     Assets:  Communication Skills Desire for Improvement Housing Intimacy Physical Health  ADL's:  Intact  Cognition:  WNL  Sleep:  Number of Hours: 6.25     Treatment Plan Summary:  Patient is no longer depressed. She is not endorsing any suicidal thoughts. Hopeful discharge early this week.  Psychiatric: MDD recurrent Adjustment disorder unspecified.   Medical: Chronic pain GERD  Psychosocial:  Separation Loss of custody  Unemployed  PLAN: 1. Continue current regimen 2. Continue to monitor mood, behavior and interaction with peers 3. Collateral from her boyfriend  Georgiann Cocker, MD 05/30/2016, 2:59 PMPatient ID: Sandra [name], female   DOB: December 25, 1970, 46 y.o.   MRN: [id number] Patient ID: Sandra [name], female   DOB: 1970-12-09, 46 y.o.   MRN: [id number]

## 2016-05-30 NOTE — Progress Notes (Signed)
   Date:  05/29/2016 Time:  0930  Group Topic/Focus:  Goals Group:   The focus of this group is to help patients establish daily goals to achieve during treatment and discuss how the patient can incorporate goal setting into their daily lives to aide in recovery.  Participation Level:  Active  Participation Quality:  Appropriate  Affect:  Appropriate  Cognitive:  Alert  Insight: Good  Engagement in Group:  Engaged  Modes of Intervention:  Education  Additional Comments:    Rich Brave 05/30/2016, 9:06 AM

## 2016-05-30 NOTE — Progress Notes (Signed)
D:  Patient's self inventory sheet, patient has fair sleep, sleep medication not helpful.  Fair appetite, normal energy level, good concentration.  Rated depression 1, denied hopeless, anxiety #2.  Denied withdrawals.  Denied SI.  Denied physical problems.  Denied physical pain. Goal is to discharge.  Plans to discharge.  Does have discharge plans. A:  Medications administered per MD orders.  Emotional support and encouragement given patient. R:  Denied SI and HI, contracts for safety.  Denied A/V hallucinations.  Safety maintained with 15 minute checks.

## 2016-05-30 NOTE — [facility] Group Notes (Signed)
[facility] Group Notes:  (Clinical Social Work)   05/30/2016    11:00AM-12:00PM  Summary of Progress/Problems:   The main focus of today's process group was to              1)  discuss the importance of adding appropriate supports to help in recovery goals             2)  identify the patient's current healthy supports             3)  talk about patients' current unhealthy supports and plan how to handle them             4)  Illustrate need for self support  An emphasis was placed on using professional supports as part of the plan to stay well and work on recovery.  The patient expressed full comprehension of the concepts presented.  The patient stated her boyfriend is going to be a good support for her.  She did not participate all in the discussion, but listened with an interested look on her face, tracked the discussion with her eyes.  Type of Therapy:  Process Group with Motivational Interviewing  Participation Level:  Active  Participation Quality:  Attentive  Affect: Appropriate  Cognitive:  Appropriate  Insight:  Engaged  Engagement in Therapy:  Engaged  Modes of Intervention:   Education, Support and [employer], LCSW 05/30/2016    1:21 PM

## 2016-05-31 MED ORDER — METOPROLOL TARTRATE 25 MG PO TABS: 12.5000 mg | ORAL_TABLET | Freq: Two times a day (BID) | ORAL | 0 refills | Status: DC

## 2016-05-31 MED ORDER — LISINOPRIL 20 MG PO TABS: 20.0000 mg | ORAL_TABLET | Freq: Every day | ORAL | 0 refills | Status: DC

## 2016-05-31 MED ORDER — MUPIROCIN 2 % EX OINT: TOPICAL_OINTMENT | Freq: Two times a day (BID) | CUTANEOUS | 0 refills | Status: DC

## 2016-05-31 MED ORDER — CITALOPRAM HYDROBROMIDE 20 MG PO TABS
20.0000 mg | ORAL_TABLET | Freq: Every day | ORAL | 0 refills | Status: DC
Start: 2016-06-01 — End: 2016-09-28

## 2016-05-31 MED ORDER — HYDROXYZINE HCL 25 MG PO TABS: 25.0000 mg | ORAL_TABLET | Freq: Three times a day (TID) | ORAL | 0 refills | Status: DC | PRN

## 2016-05-31 MED ORDER — TRAZODONE HCL 50 MG PO TABS: 50.0000 mg | ORAL_TABLET | Freq: Every evening | ORAL | 0 refills | Status: DC | PRN

## 2016-05-31 MED ORDER — MIRTAZAPINE 15 MG PO TABS: 15.0000 mg | ORAL_TABLET | Freq: Every day | ORAL | 0 refills | Status: DC

## 2016-05-31 NOTE — Progress Notes (Signed)
D:  Patient's self inventory sheet, patient has fair sleep, no sleep medication.  Fair appetite, normal energy level, good concentration.  Denied depression and hopeless, rated anxiety #2.  Denied withdrawals.  Denied SI.  Denied physical problems.  Denied pain.  Goal is leaving.  Plans to leave, go home.  Does have discharge plans. A:  Medications administered per MD orders.  Emotional support and encouragement given patient. R:  Denied SI and HI, contracts for safety.  Denied A/V hallucinations.  Safety maintained with 15 minute checks.

## 2016-05-31 NOTE — Discharge Summary (Signed)
Physician Discharge Summary Note  Patient:  Sandra [name] is an 46 y.o., female MRN:  [id number] DOB:  [birth year] Patient phone:  [phone] (home)  Patient address:   [address] [place] [address],  Total Time spent with patient: Greater than 30 minutes  Date of Admission:  05/26/2016 Date of Discharge: 05-31-16  Reason for Admission: Intentional drug overdose.  Principal Problem: Major depressive disorder, recurrent severe without psychosis.  Discharge Diagnoses: Patient Active Problem List   Diagnosis Date Noted  . MDD (major depressive disorder), recurrent severe, without psychosis (HCC) [F33.2] 11/15/2014    Priority: High    Class: Chronic  . Essential hypertension [I10] 05/26/2016  . Accidental drug overdose [T50.901A]   . Opiate overdose [T40.601A]   . Sepsis (HCC) [A41.9] 03/13/2016  . Pyelonephritis [N12] 03/13/2016  . Depression [F32.9] 03/13/2016  . AKI (acute kidney injury) (HCC) [N17.9] 03/13/2016  . Hypokalemia [E87.6] 03/13/2016  . Major depressive disorder, single episode, severe without psychosis (HCC) [F32.2] 11/15/2014  . Altered mental status [R41.82] 01/15/2014  . Acute encephalopathy [G93.40] 02/13/2013  . Acute respiratory failure (HCC) [J96.00] 02/13/2013  . Aspiration pneumonia (HCC) [J69.0] 02/13/2013  . Lumbar spondylosis [M47.816] 04/21/2011  . Trochanteric bursitis [M70.60] 04/21/2011  . Myofascial pain [M79.1] 04/21/2011  . TACHYCARDIA [R00.0] 03/06/2010  . DYSPNEA [R06.02] 01/06/2010  . RESTLESS LEG SYNDROME [G25.81] 12/11/2009  . MIGRAINE HEADACHE [G43.909] 12/11/2009  . GERD (gastroesophageal reflux disease) [K21.9] 12/11/2009  . Gastroparesis [K31.84] 12/11/2009  . DIABETES MELLITUS, GESTATIONAL, HX OF [Z87.42] 12/11/2009   Past Psychiatric History: Major depression  Past Medical History:  Past Medical History:  Diagnosis Date  . Anemia    a. Mild with Hgb 11.2, 09/2012. UA with mod Hgb. Instructed to f/u  PCP.  Marland Kitchen Anxiety   . Anxiety   . Atrial fibrillation with RVR (HCC)    a. Dx 09/2012, spont conv to NSR.  Marland Kitchen Chronic lower back pain   . Depression   . Gastroparesis   . GERD (gastroesophageal reflux disease)   . Gestational diabetes   . H/O echocardiogram    a. 02/2010: done for htn/tachy while pregnant - EF 55-60%, grade 1 d/dysf, mildly dilated LA.  . High cholesterol   . Hypertension   . Kidney stones   . Migraines   . Obesity   . Panic attack   . Restless legs   . Sleep apnea     Past Surgical History:  Procedure Laterality Date  . BACK SURGERY    . CESAREAN SECTION  2012  . CYSTOSCOPY W/ URETERAL STENT PLACEMENT  2006  . CYSTOSCOPY/RETROGRADE/URETEROSCOPY/STONE EXTRACTION WITH BASKET  1987  . DILATION AND CURETTAGE OF UTERUS  2006; 2010  . TUBAL LIGATION  2012   Family History:  Family History  Problem Relation Age of Onset  . Diabetes Mother   . Hypertension Mother   . Migraines Father   . Migraines Brother    Family Psychiatric  History: See H&P  Social History:  History  Alcohol Use No     History  Drug Use No    Social History   Social History  . Marital status: Legally Separated    Spouse name: N/A  . Number of children: N/A  . Years of education: N/A   Social History Main Topics  . Smoking status: Never Smoker  . Smokeless tobacco: Never Used  . Alcohol use No  . Drug use: No  . Sexual activity: Not Asked   Other  Topics Concern  . None   Social History Narrative   Separated, 6 children   Right handed   2 yr college   3-4 cans of soda         Hospital Course: 46 yo Caucasian female, separated, lives with her boyfriend. History of MDD recurrent and chronic pain. Recently treated at the ICU for AMS following intentional OD. Was in acute respiratory failure at presentation. Reported to have been under multiple stressors lately. Had a miscarriage a month ago. Has not been able to see her children for over a year.Lost her job three days  before the OD. Sandra [name] was admitted to the [facility] adult unit for mood stabilization treatments.  While a patient in this hospital, Sandra [name] was medicated & discharged on; Citalopram 20 mg for depression, Hydroxyzine 25 mg prn for anxiety, Mirtazapine 15 mg for depression/insomnia & Trazodone 50 mg prn for insomnia. She was also enrolled & participated in the group counseling sessions being offered & held on this unit. She learned coping skills that should help cope better after discharge to maintain safety & mood stability. Besides the mood stabilization treatment, Sandra [name] was resumed on all her pertinent home medications for the other medical issues presented, She tolerated her treatment regimen without any adverse effects or reactions reported.  Sandra [name]'s symptoms responded well to her treatment regimen. Seen today. She continues to report improved mood. Says she is no longer having any suicidal thoughts. Says her main stressor was that she lost her job. Says she has gotten her job back. Patient says she would be able to contact her older children. Says she was able to process effects of suicide on her family. Repeatedly tells me that she has no intention of harming herself. She reports very good support from her boyfriend. Says he has been visiting regularly. He was here yesterday and feels she is back to her normal self. Patient states that she is able to think clearly. No evidence of mania. No evidence of psychosis. No overwhelming anxiety. No craving for substances  Nursing staff reports that patient has been appropriate on the unit. Patient has been interacting well with peers. No behavioral issues. Patient has not voiced any suicidal thoughts. Patient has not been observed to be internally stimulated. Patient has been adherent with treatment recommendations. Patient has been tolerating their medication well.   SW contacted her boyfriend today. No concerns about her personal safety was raised. He feels  she is back to her old self. He plans to pick her up today. Sandra [name] left [facility] in no apparent distress. Transportation per boyfriend.   Physical Findings: AIMS: Facial and Oral Movements Muscles of Facial Expression: None, normal Lips and Perioral Area: None, normal Jaw: None, normal Tongue: None, normal,Extremity Movements Upper (arms, wrists, hands, fingers): None, normal Lower (legs, knees, ankles, toes): None, normal, Trunk Movements Neck, shoulders, hips: None, normal, Overall Severity Severity of abnormal movements (highest score from questions above): None, normal Incapacitation due to abnormal movements: None, normal Patient's awareness of abnormal movements (rate only patient's report): No Awareness, Dental Status Current problems with teeth and/or dentures?: No Does patient usually wear dentures?: No  CIWA:  CIWA-Ar Total: 1 COWS:  COWS Total Score: 3  Musculoskeletal: Strength & Muscle Tone: within normal limits Gait & Station: normal Patient leans: Right  Psychiatric Specialty Exam: Physical Exam  Constitutional: She is oriented to person, place, and time. She appears well-developed.  HENT:  Head: Normocephalic.  Eyes: Pupils are equal, round, and reactive to  light.  Neck: Normal range of motion.  Cardiovascular:  Hx. HTN  Respiratory: Effort normal.  GI: Soft.  Genitourinary:  Genitourinary Comments: Deferred  Musculoskeletal: Normal range of motion.  Neurological: She is alert and oriented to person, place, and time.  Skin: Skin is warm and dry.    Review of Systems  Constitutional: Negative.   HENT: Negative.   Eyes: Negative.   Respiratory: Negative.   Cardiovascular: Negative.   Gastrointestinal: Negative.   Genitourinary: Negative.   Musculoskeletal: Negative.   Skin: Negative.   Neurological: Negative.   Endo/Heme/Allergies: Negative.   Psychiatric/Behavioral: Positive for depression (Stable) and substance abuse (Hx, Benzodiazepine & Opioid use  disorder). Negative for hallucinations, memory loss and suicidal ideas. The patient has insomnia (Stable). The patient is not nervous/anxious.     Blood pressure (!) 144/101, pulse 92, temperature 98 F (36.7 C), temperature source Oral, resp. rate 16, height  (1.626 m), weight 81.6 kg (180 lb), last menstrual period 04/28/2016, SpO2 100 %.Body mass index is 30.9 kg/m.  See Md's SRA   Have you used any form of tobacco in the last 30 days? (Cigarettes, Smokeless Tobacco, Cigars, and/or Pipes): No  Has this patient used any form of tobacco in the last 30 days? (Cigarettes, Smokeless Tobacco, Cigars, and/or Pipes): No  Blood Alcohol level:  Lab Results  Component Value Date   ETH <5 05/23/2016   ETH <5 05/06/2016   Metabolic Disorder Labs:  Lab Results  Component Value Date   HGBA1C 5.6 05/23/2016   MPG 114 05/23/2016   MPG 123 (H) 10/04/2012   No results found for: PROLACTIN Lab Results  Component Value Date   TRIG 573 (H) 02/13/2013   See Psychiatric Specialty Exam and Suicide Risk Assessment completed by Attending Physician prior to discharge.  Discharge destination:  Home  Is patient on multiple antipsychotic therapies at discharge:  No   Has Patient had three or more failed trials of antipsychotic monotherapy by history:  No  Recommended Plan for Multiple Antipsychotic Therapies: NA  Allergies as of 05/31/2016      Reactions   Iodides Hives   Imitrex [sumatriptan]    Pt states she takes imitrex injection-"sometimes it works and sometimes doesn't"   Toradol [ketorolac Tromethamine] Hives   Metoclopramide Hcl Anxiety      Medication List    STOP taking these medications   folic acid 1 MG tablet Commonly known as:  FOLVITE   multivitamin with minerals Tabs tablet   pregabalin 200 MG capsule Commonly known as:  LYRICA   zolpidem 10 MG tablet Commonly known as:  AMBIEN     TAKE these medications     Indication  citalopram 20 MG tablet Commonly known  as:  CELEXA Take 1 tablet (20 mg total) by mouth daily. For depression Start taking on:  06/01/2016 What changed:  additional instructions  Indication:  Depression   hydrOXYzine 25 MG tablet Commonly known as:  ATARAX/VISTARIL Take 1 tablet (25 mg total) by mouth 3 (three) times daily as needed for anxiety.  Indication:  Anxiety Neurosis   lisinopril 20 MG tablet Commonly known as:  PRINIVIL,ZESTRIL Take 1 tablet (20 mg total) by mouth daily. For high blood pressure Start taking on:  06/01/2016 What changed:  additional instructions  Indication:  High Blood Pressure Disorder   metoprolol tartrate 25 MG tablet Commonly known as:  LOPRESSOR Take 0.5 tablets (12.5 mg total) by mouth 2 (two) times daily. For high blood pressure What changed:  additional instructions  Indication:  High Blood Pressure Disorder   mirtazapine 15 MG tablet Commonly known as:  REMERON Take 1 tablet (15 mg total) by mouth at bedtime. For depression/sleep What changed:  medication strength  how much to take  additional instructions  Indication:  Major Depressive Disorder, Insomnia   mupirocin ointment 2 % Commonly known as:  BACTROBAN Apply topically 2 (two) times daily. For wound care  Indication:  Wound care   traZODone 50 MG tablet Commonly known as:  DESYREL Take 1 tablet (50 mg total) by mouth at bedtime as needed for sleep.  Indication:  Trouble Sleeping      Follow-up Information    [facility] Follow up.   Specialty:  Behavioral Health Why:  Please resume service with [facility] at discharge. Clinical Social Worker notified ACT team of discharge. Thank you.  [address] [place] [address] [phone]          Follow-up recommendations: Activity:  As tolerated Diet: As recommended by your primary care doctor. Keep all scheduled follow-up appointments as recommended.   Comments: Patient is instructed prior to discharge to: Take all medications as  prescribed by his/her mental healthcare provider. Report any adverse effects and or reactions from the medicines to his/her outpatient provider promptly. Patient has been instructed & cautioned: To not engage in alcohol and or illegal drug use while on prescription medicines. In the event of worsening symptoms, patient is instructed to call the crisis hotline, 911 and or go to the nearest ED for appropriate evaluation and treatment of symptoms. To follow-up with his/her primary care provider for your other medical issues, concerns and or health care needs.   Signed: Sanjuana Kava, NP, PMHNP, FNP-BC 05/31/2016, 2:52 PM

## 2016-05-31 NOTE — Progress Notes (Signed)
Recreation Therapy Notes  Date: 05/31/16 Time: 0930 Location: 400 Hall Dayroom  Group Topic: Stress Management  Goal Area(s) Addresses:  Patient will verbalize importance of using healthy stress management.  Patient will identify positive emotions associated with healthy stress management.   Behavioral Response: Engaged  Intervention: Stress Management  Activity :  Guided Imagery.  LRT introduced the stress management technique of guided imagery.  LRT read a script to allow patients to participate in the technique of guided imagery.  Patients were to follow along as the script was read to engage in the activity.  Education:  Stress Management, Discharge Planning.   Education Outcome: Acknowledges edcuation/In group clarification offered/Needs additional education  Clinical Observations/Feedback: Pt attended group.   Denelle Capurro, LRT/CTRS         Maryori Weide A 05/31/2016 11:48 AM 

## 2016-05-31 NOTE — [facility] Suicide Risk Assessment (Signed)
[facility] Discharge Suicide Risk Assessment   Principal Problem: MDD Recurrent                                  SUD Discharge Diagnoses:  Patient Active Problem List   Diagnosis Date Noted  . Essential hypertension [I10] 05/26/2016  . Accidental drug overdose [T50.901A]   . Opiate overdose [T40.601A]   . Sepsis (HCC) [A41.9] 03/13/2016  . Pyelonephritis [N12] 03/13/2016  . Depression [F32.9] 03/13/2016  . AKI (acute kidney injury) (HCC) [N17.9] 03/13/2016  . Hypokalemia [E87.6] 03/13/2016  . MDD (major depressive disorder), recurrent severe, without psychosis (HCC) [F33.2] 11/15/2014    Class: Chronic  . Major depressive disorder, single episode, severe without psychosis (HCC) [F32.2] 11/15/2014  . Altered mental status [R41.82] 01/15/2014  . Acute encephalopathy [G93.40] 02/13/2013  . Acute respiratory failure (HCC) [J96.00] 02/13/2013  . Aspiration pneumonia (HCC) [J69.0] 02/13/2013  . Lumbar spondylosis [M47.816] 04/21/2011  . Trochanteric bursitis [M70.60] 04/21/2011  . Myofascial pain [M79.1] 04/21/2011  . TACHYCARDIA [R00.0] 03/06/2010  . DYSPNEA [R06.02] 01/06/2010  . RESTLESS LEG SYNDROME [G25.81] 12/11/2009  . MIGRAINE HEADACHE [G43.909] 12/11/2009  . GERD (gastroesophageal reflux disease) [K21.9] 12/11/2009  . Gastroparesis [K31.84] 12/11/2009  . DIABETES MELLITUS, GESTATIONAL, HX OF [Z87.42] 12/11/2009    Total Time spent with patient: 45 minutes  Musculoskeletal: Strength & Muscle Tone: within normal limits Gait & Station: normal Patient leans: N/A  Psychiatric Specialty Exam: Review of Systems  Constitutional: Negative.   HENT: Negative.   Eyes: Negative.   Respiratory: Negative.   Cardiovascular: Negative.   Gastrointestinal: Negative.   Genitourinary: Negative.   Musculoskeletal: Negative.   Skin: Negative.   Neurological: Negative.   Endo/Heme/Allergies: Negative.   Psychiatric/Behavioral: Negative for depression, hallucinations, memory loss, substance  abuse and suicidal ideas. The patient is not nervous/anxious and does not have insomnia.     Blood pressure (!) 144/101, pulse 92, temperature 98 F (36.7 C), temperature source Oral, resp. rate 16, height  (1.626 m), weight 81.6 kg (180 lb), last menstrual period 04/28/2016, SpO2 100 %.Body mass index is 30.9 kg/m.  General Appearance: Neatly dressed, pleasant, engaging well and cooperative. Appropriate behavior. Not in any distress. Good relatedness. Not internally stimulated  Eye Contact::  Good  Speech:  Spontaneous, normal prosody. Normal tone and rate.   Volume:  Normal  Mood:  Euthymic  Affect:  Appropriate and Full Range  Thought Process:  Goal Directed and Linear  Orientation:  Full (Time, Place, and Person)  Thought Content:  No delusional theme. No preoccupation with violent thoughts. No negative ruminations. No obsession.  No hallucination in any modality.   Suicidal Thoughts:  No  Homicidal Thoughts:  No  Memory:  Immediate;   Good Recent;   Good Remote;   Good  Judgement:  Good  Insight:  Good  Psychomotor Activity:  Normal  Concentration:  Good  Recall:  Good  Fund of Knowledge:Good  Language: Good  Akathisia:  No  Handed:    AIMS (if indicated):     Assets:  Communication Skills Desire for Improvement Financial Resources/Insurance Housing Intimacy Physical Health Resilience Social Support Transportation Vocational/Educational  Sleep:  Number of Hours: 5.75  Cognition: WNL  ADL's:  Intact   Clinical  Assessment: 46 yo Caucasian female, separated, lives with her boyfriend. History of MDD recurrent and chronic pain. Recently treated at the ICU for AMS following intentional OD. Was in acute respiratory  failure at presentation. Reported to have been under multiple stressors lately. Had a miscarriage a month ago.  Has not been able to see her children for over a year.  Lost her job three days before the OD.  Seen today. Patient continues to report normal  mood. Says she is no longer having any suicidal thoughts. Says her main stressor was that she lost her job. Says she has gotten her job back. Patient says she would be able to contact her older children. Says she was able to process effects of suicide on her family. Repeatedly tells me that she has no intention of harming herself. She reports very good support from her boyfriend. Says he has been visiting regularly. He was here yesterday and feels she is back to her normal self. Patient states that she is able to think clearly. No evidence of mania. No evidence of psychosis. No overwhelming anxiety. No craving for substances  Nursing staff reports that patient has been appropriate on the unit. Patient has been interacting well with peers. No behavioral issues. Patient has not voiced any suicidal thoughts. Patient has not been observed to be internally stimulated. Patient has been adherent with treatment recommendations. Patient has been tolerating their medication well.   SW contacted her boyfriend today. No concerns about her personal safety was raised. He feels she is back to her old self. He plans to pick her up today.   Demographic Factors:  Separated.   Loss Factors: Loss of custody  Historical Factors: Prior suicide attempts and Impulsivity  Risk Reduction Factors:   Responsible for children under 43 years of age, Sense of responsibility to family, Living with another person, especially a relative, Positive social support, Positive therapeutic relationship and Positive coping skills or problem solving skills  Continued Clinical Symptoms:  As above   Cognitive Features That Contribute To Risk:  None    Suicide Risk:  Minimal: No identifiable suicidal ideation.   Patient is not having any thoughts of suicide at this time. Modifiable risk factors targeted during this admission includes depression and substance use. Demographical and historical risk factors cannot be modified. Patient is  now engaging well. Patient is reliable and is future oriented. We have buffered patient's support structures. At this point, patient is at low risk of suicide. Patient is aware of the effects of psychoactive substances on decision making process. Patient has been provided with emergency contacts. Patient acknowledges to use resources provided if unforseen circumstances changes their current risk stratification.    Follow-up Information    [facility] Follow up.   Specialty:  Behavioral Health Why:  Please resume service with [facility] at discharge. Clinical Social Worker notified ACT team of discharge. Thank you.  Contact information: [address] [place] [address] [phone]           Plan Of Care/Follow-up recommendations:  1. Continue current psychotropic medications 2. Mental health and addiction follow up as arranged.  3. Discharge in care of her boyfriend.  4. Provided limited quantity of prescriptions  Georgiann Cocker, MD 05/31/2016, 2:29 PM

## 2016-05-31 NOTE — Progress Notes (Signed)
Pt discharged home with the boyfriend. Pt was ambulatory, stable and appreciative at that time. All papers and prescriptions were given and valuables returned. Verbal understanding expressed. Denies SI/HI and A/VH. Pt given opportunity to express concerns and ask questions.  

## 2016-05-31 NOTE — Progress Notes (Signed)
  [facility] Adult Case Management Discharge Plan :  Will you be returning to the same living situation after discharge:  Yes,  home At discharge, do you have transportation home?: Yes,  fiance Do you have the ability to pay for your medications: Yes,  [facility]  Release of information consent forms completed and submitted to medical records by CSW.  Patient to Follow up at: Follow-up Information    [facility] Follow up.   Specialty:  Behavioral Health Why:  [facility]. Contact information: [address] [place] [address] [phone]           Next level of care provider has access to [facility] Link:no  Safety Planning and Suicide Prevention discussed: Yes,  SPE completed with pt; contact attempts made with pt's fiance.  Have you used any form of tobacco in the last 30 days? (Cigarettes, Smokeless Tobacco, Cigars, and/or Pipes): No  Has patient been referred to the Quitline?: N/A patient is not a smoker  Patient has been referred for addiction treatment: Yes  Ledell Peoples Smart LCSW 05/31/2016, 8:52 AM

## 2016-05-31 NOTE — Progress Notes (Signed)
Pt has been in the dayroom watching TV.  She stays mostly to herself with minimal interaction with anyone.  She says that she is doing better than she was at her admission.  Her fingers are healing well.  She says she may be going to additional treatment after her detox.   She still has pain to the L side of her head.  She is not sure why she is having this pain.  She received Tylenol for her pain as well as her scheduled hs med.  She denies SI/HI/AVH.  She makes her needs known to staff, and has been pleasant and appropriate in the milieu.  Support and encouragement offered.  Discharge plans are in process.  Safety maintained with q15 minute checks.

## 2016-05-31 NOTE — Plan of Care (Signed)
Problem: Education: Goal: Ability to make informed decisions regarding treatment will improve Outcome: Progressing Nurse discussed depression/anxiety/coping skills with patient.    

## 2016-05-31 NOTE — Tx Team (Signed)
Interdisciplinary Treatment and Diagnostic Plan Update  05/31/2016 Time of Session: [place] MRN: [id number]  Principal Diagnosis: MDD  Secondary Diagnoses: Active Problems:   MDD (major depressive disorder), recurrent severe, without psychosis ([place])   Current Medications:  Current Facility-Administered Medications  Medication Dose Route Frequency Provider Last Rate Last Dose  . acetaminophen (TYLENOL) tablet 650 mg  650 mg Oral Q6H PRN Ethelene Hal, NP   650 mg at 05/30/16 2227  . alum & mag hydroxide-simeth (MAALOX/MYLANTA) 200-200-20 MG/5ML suspension 30 mL  30 mL Oral Q4H PRN Ethelene Hal, NP      . citalopram (CELEXA) tablet 20 mg  20 mg Oral Daily Ethelene Hal, NP   20 mg at 05/31/16 0742  . hydrOXYzine (ATARAX/VISTARIL) tablet 25 mg  25 mg Oral TID PRN Jenne Campus, MD   25 mg at 05/30/16 0953  . lisinopril (PRINIVIL,ZESTRIL) tablet 20 mg  20 mg Oral Daily Ethelene Hal, NP   20 mg at 05/31/16 0742  . magnesium hydroxide (MILK OF MAGNESIA) suspension 30 mL  30 mL Oral Daily PRN Ethelene Hal, NP      . metoprolol tartrate (LOPRESSOR) tablet 12.5 mg  12.5 mg Oral BID Ethelene Hal, NP   12.5 mg at 05/31/16 0743  . mirtazapine (REMERON) tablet 15 mg  15 mg Oral QHS Artist Beach, MD   15 mg at 05/30/16 2132  . mupirocin ointment (BACTROBAN) 2 %   Topical BID Ethelene Hal, NP      . thiamine (VITAMIN B-1) tablet 100 mg  100 mg Oral Daily Ethelene Hal, NP   100 mg at 05/31/16 0743  . traZODone (DESYREL) tablet 50 mg  50 mg Oral QHS PRN Ethelene Hal, NP   50 mg at 05/31/16 0013   PTA Medications: Prescriptions Prior to Admission  Medication Sig Dispense Refill Last Dose  . citalopram (CELEXA) 20 MG tablet Take 20 mg by mouth daily.   Not Taking at Unknown time  . folic acid (FOLVITE) 1 MG tablet Take 1 tablet (1 mg total) by mouth daily.     Marland Kitchen lisinopril (PRINIVIL,ZESTRIL) 20 MG tablet Take 1  tablet (20 mg total) by mouth daily. 90 tablet 0   . metoprolol tartrate (LOPRESSOR) 25 MG tablet Take 0.5 tablets (12.5 mg total) by mouth 2 (two) times daily.     . mirtazapine (REMERON) 45 MG tablet Take 45 mg by mouth at bedtime.    Not Taking at Unknown time  . Multiple Vitamin (MULTIVITAMIN WITH MINERALS) TABS tablet Take 1 tablet by mouth daily.   Not Taking at Unknown time  . pregabalin (LYRICA) 200 MG capsule Take 200 mg by mouth 2 (two) times daily.    05/06/2016 at Unknown time  . zolpidem (AMBIEN) 10 MG tablet Take 10 mg by mouth at bedtime.   Not Taking at Unknown time    Patient Stressors: Financial difficulties Health problems Loss of job and pregnancy  Patient Strengths: Ability for insight Average or above average intelligence Capable of independent living  Treatment Modalities: Medication Management, Group therapy, Case management,  1 to 1 session with clinician, Psychoeducation, Recreational therapy.   Physician Treatment Plan for Primary Diagnosis:MDD  Long Term Goal(s): Improvement in symptoms so as ready for discharge Improvement in symptoms so as ready for discharge   Short Term Goals: Ability to identify changes in lifestyle to reduce recurrence of condition will improve Ability to verbalize feelings will improve  Ability to demonstrate self-control will improve Ability to identify and develop effective coping behaviors will improve Compliance with prescribed medications will improve Ability to identify triggers associated with substance abuse/mental health issues will improve  Medication Management: Evaluate patient's response, side effects, and tolerance of medication regimen.  Therapeutic Interventions: 1 to 1 sessions, Unit Group sessions and Medication administration.  Evaluation of Outcomes: Met  Physician Treatment Plan for Secondary Diagnosis: Active Problems:   MDD (major depressive disorder), recurrent severe, without psychosis ([place])  Ability to  identify changes in lifestyle to reduce recurrence of condition will improve Ability to verbalize feelings will improve Ability to demonstrate self-control will improve Ability to identify and develop effective coping behaviors will improve Compliance with prescribed medications will improve Ability to identify triggers associated with substance abuse/mental health issues will improve     Medication Management: Evaluate patient's response, side effects, and tolerance of medication regimen.  Therapeutic Interventions: 1 to 1 sessions, Unit Group sessions and Medication administration.  Evaluation of Outcomes: Met  RN Treatment Plan for Primary Diagnosis: MDD Long Term Goal(s): Knowledge of disease and therapeutic regimen to maintain health will improve  Short Term Goals: Ability to remain free from injury will improve, Ability to verbalize feelings will improve and Ability to disclose and discuss suicidal ideas  Medication Management: RN will administer medications as ordered by provider, will assess and evaluate patient's response and provide education to patient for prescribed medication. RN will report any adverse and/or side effects to prescribing provider.  Therapeutic Interventions: 1 on 1 counseling sessions, Psychoeducation, Medication administration, Evaluate responses to treatment, Monitor vital signs and CBGs as ordered, Perform/monitor CIWA, COWS, AIMS and Fall Risk screenings as ordered, Perform wound care treatments as ordered.  Evaluation of Outcomes: Met   LCSW Treatment Plan for Primary Diagnosis:MDD  Long Term Goal(s): Safe transition to appropriate next level of care at discharge, Engage patient in therapeutic group addressing interpersonal concerns.  Short Term Goals: Engage patient in aftercare planning with referrals and resources, Facilitate patient progression through stages of change regarding substance use diagnoses and concerns and Identify triggers associated  with mental health/substance abuse issues  Therapeutic Interventions: Assess for all discharge needs, 1 to 1 time with Social worker, Explore available resources and support systems, Assess for adequacy in community support network, Educate family and significant other(s) on suicide prevention, Complete Psychosocial Assessment, Interpersonal group therapy.  Evaluation of Outcomes: Met   Progress in Treatment: Attending groups: Yes Participating in groups: Yes Taking medication as prescribed: Yes. Toleration medication: Yes. Family/Significant other contact made: SPE completed with pt; several attempts made to contact pt's fiance.  Patient understands diagnosis: Yes. Discussing patient identified problems/goals with staff: Yes. Medical problems stabilized or resolved: Yes. Denies suicidal/homicidal ideation: Yes. Issues/concerns per patient self-inventory: No. Other: n/a   New problem(s) identified: No, Describe:  n/a  New Short Term/Long Term Goal(s): medication stabilization; development of comprehensive mental wellness/sobriety plan, elimination of SI thoughts; mood regulation.   Discharge Plan or Barriers: Pt plans to return home; resume services at [facility].   Reason for Continuation of Hospitalization: none  Estimated Length of Stay: discharge today   Attendees: Patient: 05/31/2016 8:53 AM  Physician: Dr. Sanjuana Letters MD/ Dr. Parke Poisson 05/31/2016 8:53 AM  Nursing: Everlean Cherry RN 05/31/2016 8:53 AM  RN Care Manager: Lars Pinks CM 05/31/2016 8:53 AM  Social Worker: Maxie Better, LCSW 05/31/2016 8:53 AM  Recreational Therapist:  Rhunette Croft 05/31/2016 8:53 AM  Other: Lindell Spar NP; Ricky Ala NP 05/31/2016 8:53  AM  Other:  05/31/2016 8:53 AM  Other: 05/31/2016 8:53 AM    Scribe for Treatment Team: [place], LCSW 05/31/2016 8:53 AM

## 2016-05-31 NOTE — Progress Notes (Signed)
CSW has been calling Vladimir Creeks (pt's fiance) [phone] several times this morning--no answer.  Trula Slade, MSW, LCSW Clinical Social Worker 05/31/2016 9:40 AM

## 2016-08-31 IMAGING — CT CT HEAD W/O CM
2 series · 17 of 30 positions shown, 20 images · non-contrast
Comparison: None.

CLINICAL DATA: Drowsy, possible unintentional overdose

EXAM:
CT HEAD WITHOUT CONTRAST
TECHNIQUE: Contiguous axial images were obtained from the base of the skull
through the vertex without intravenous contrast.

[Series 2: head w/o · axial · non-contrast · 0.45mm/px · z∈[-183,-73]mm · 9 of 28 slices shown, 12 images]
[im 3/28  brain]
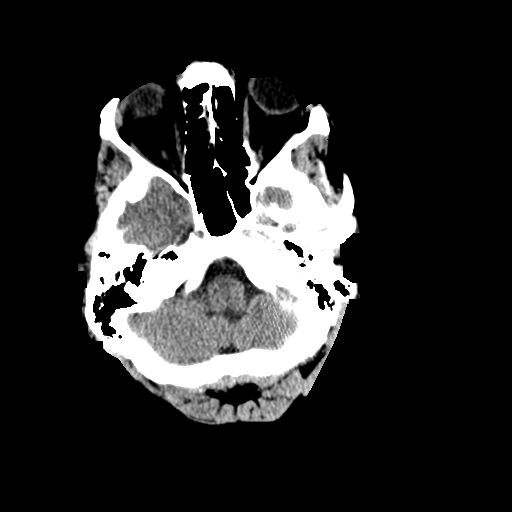
[im 3/28  bone]
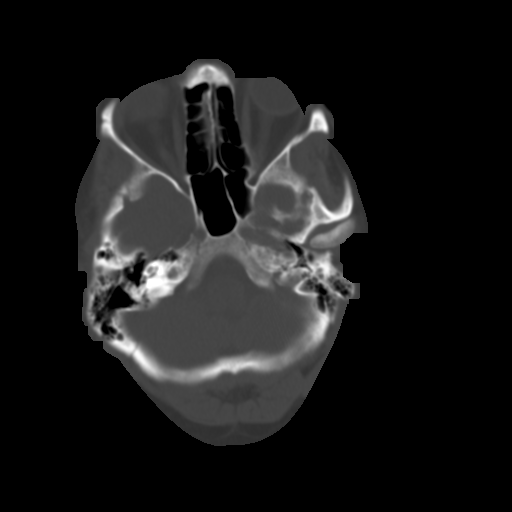
[im 6/28  brain]
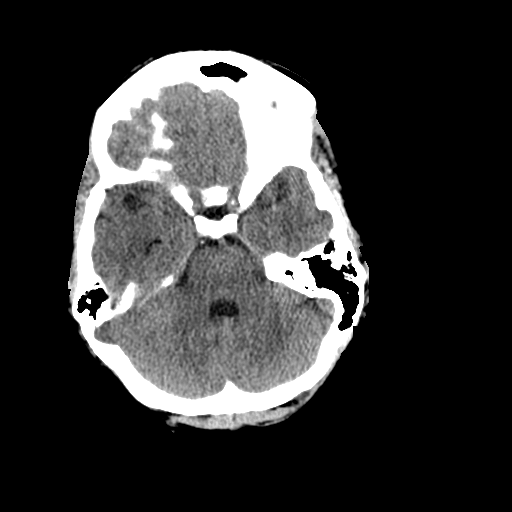
[im 9/28  brain]
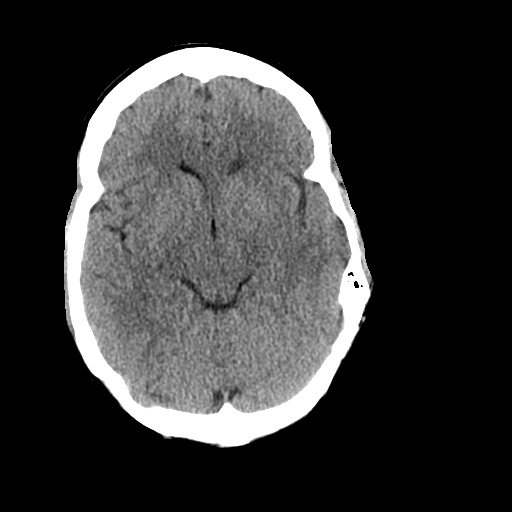
[im 11/28  brain]
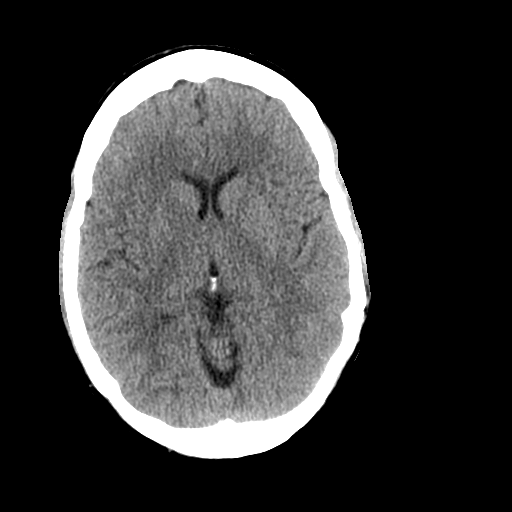
[im 14/28  brain]
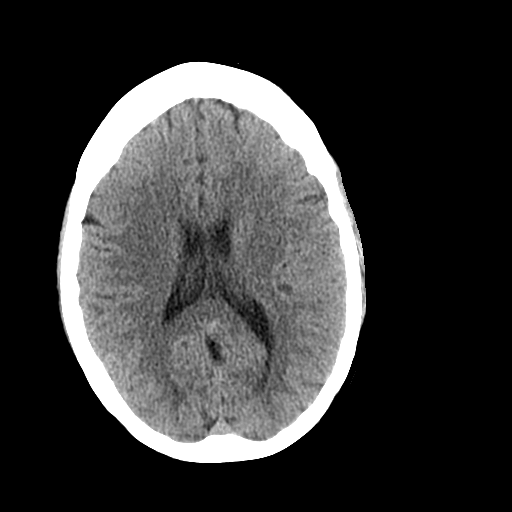
[im 14/28  bone]
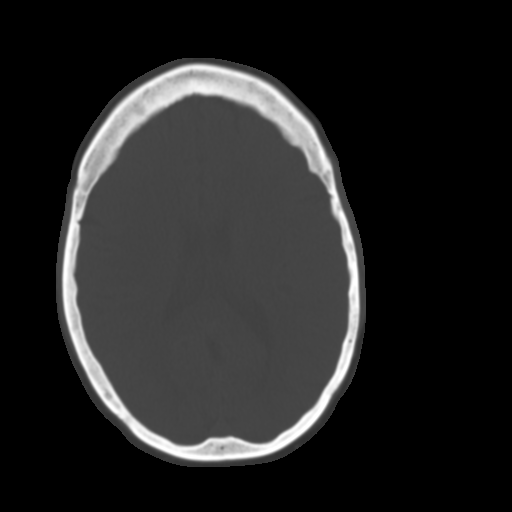
[im 17/28  brain]
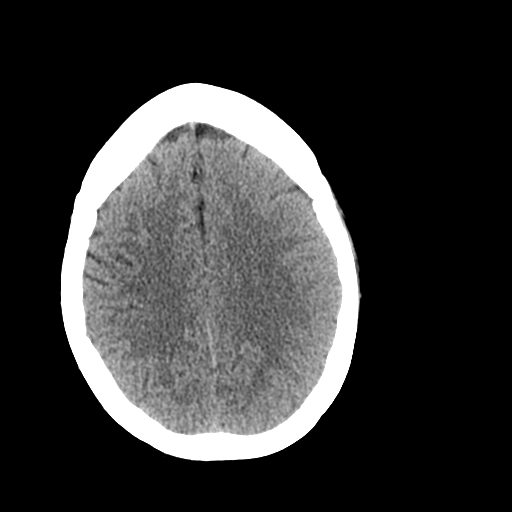
[im 19/28  brain]
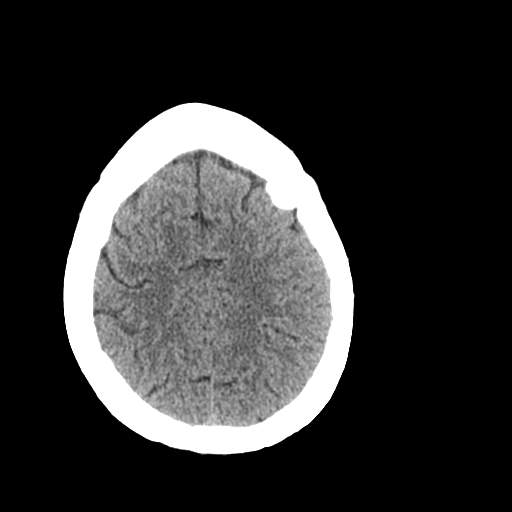
[im 22/28  brain]
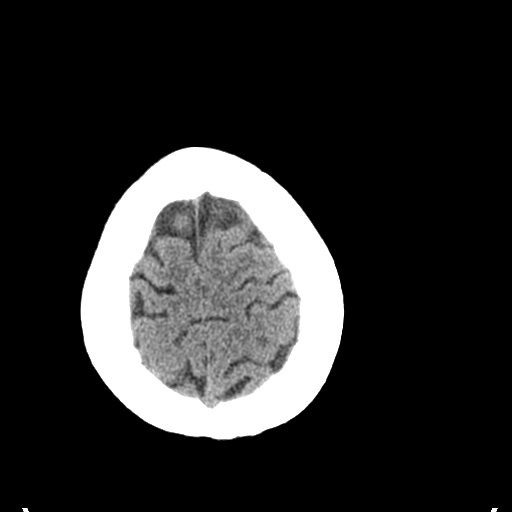
[im 25/28  brain]
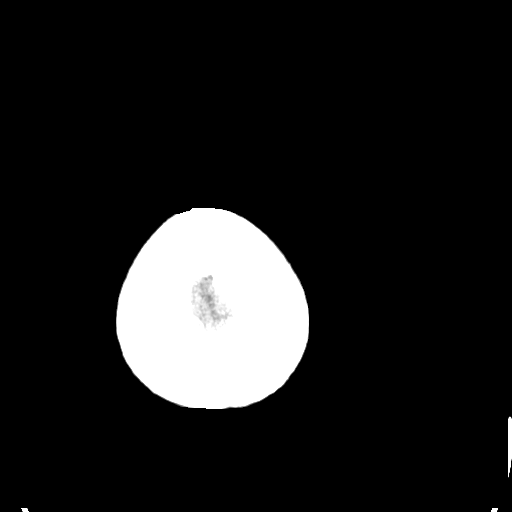
[im 25/28  bone]
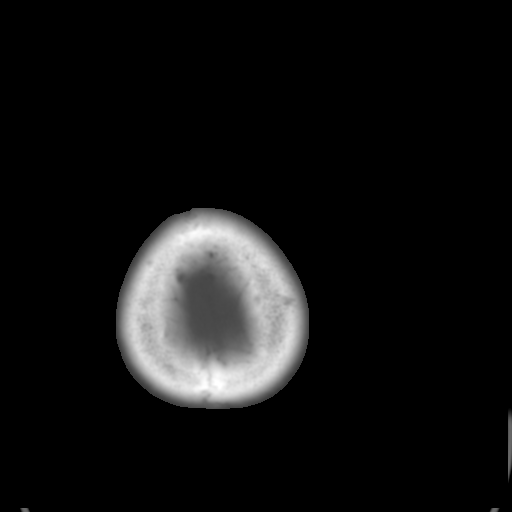

[Series 3: bone windows · axial · 0.45mm/px · z∈[-178,-73]mm · 8 of 46 slices shown]
[im 6/46  bone]
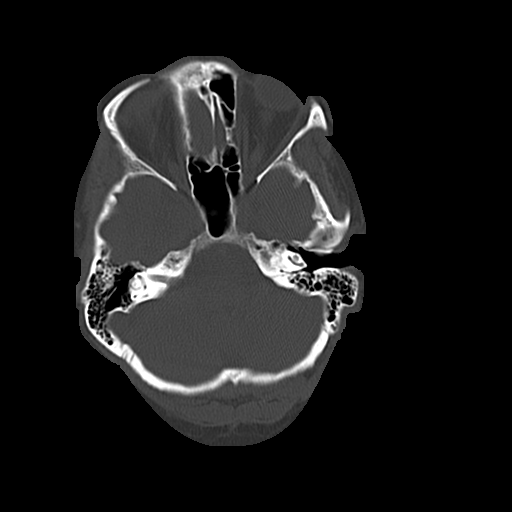
[im 11/46  bone]
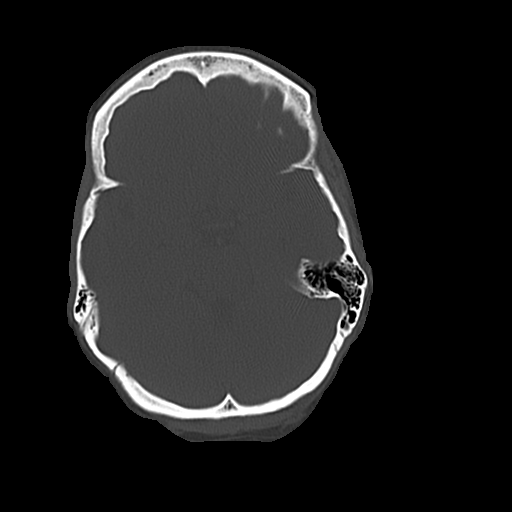
[im 16/46  bone]
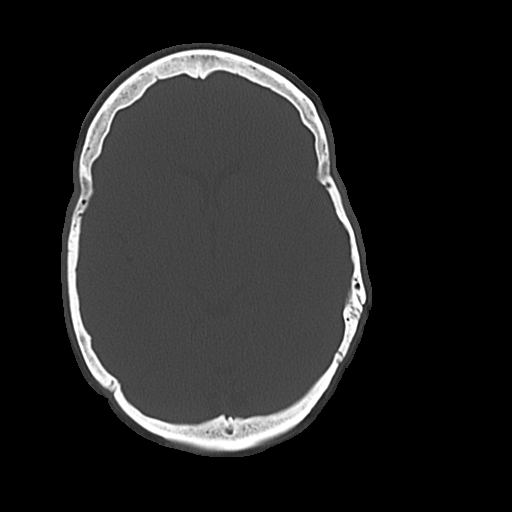
[im 21/46  bone]
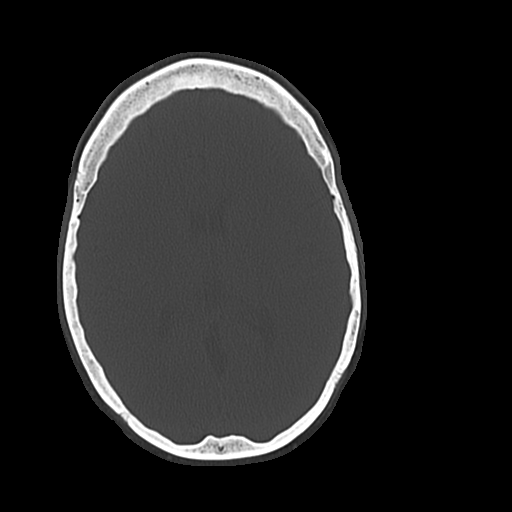
[im 26/46  bone]
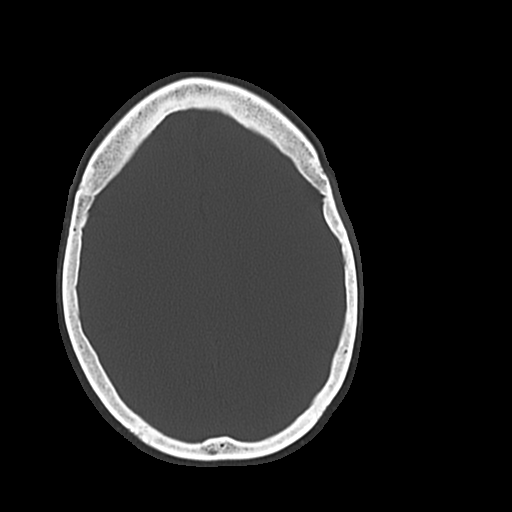
[im 31/46  bone]
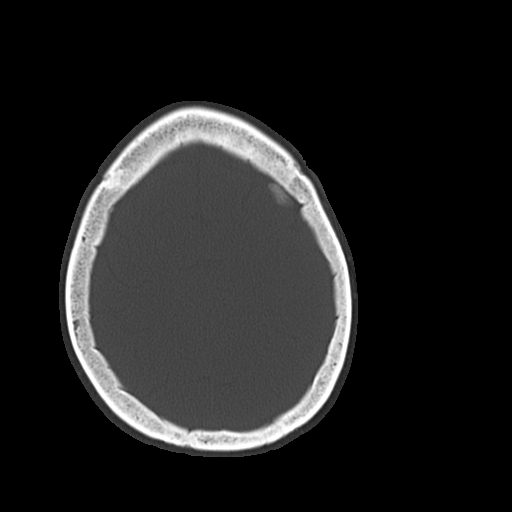
[im 36/46  bone]
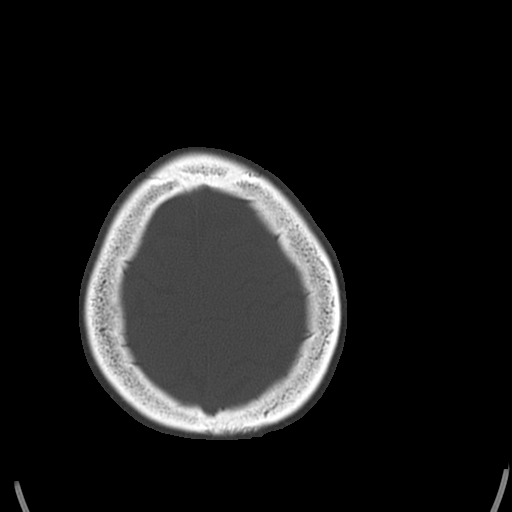
[im 41/46  bone]
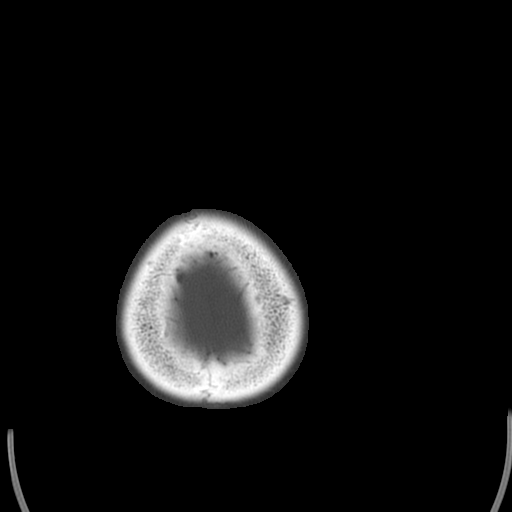

[17 of 30 positions shown; findings below may reference images not displayed]

FINDINGS: There is no evidence of mass effect, midline shift or extra-axial
fluid collections. There is no evidence of a space-occupying lesion
or intracranial hemorrhage. There is no evidence of a cortical-based
area of acute infarction.

The ventricles and sulci are appropriate for the patient's age. The
basal cisterns are patent.

Visualized portions of the orbits are unremarkable. The visualized
portions of the paranasal sinuses and mastoid air cells are
unremarkable.

The osseous structures are unremarkable.
IMPRESSION: No acute intracranial pathology.

## 2016-09-15 ENCOUNTER — Emergency Department (HOSPITAL_COMMUNITY): Payer: Medicaid Other

## 2016-09-15 ENCOUNTER — Observation Stay (HOSPITAL_COMMUNITY)
Admission: EM | Admit: 2016-09-15 | Discharge: 2016-09-17 | Disposition: A | Discharge status: Home / Self Care | Payer: Medicaid Other | Attending: Urology | Admitting: Urology

## 2016-09-15 ENCOUNTER — Encounter (HOSPITAL_COMMUNITY): Payer: Self-pay | Admitting: Emergency Medicine

## 2016-09-15 DIAGNOSIS — N132 Hydronephrosis with renal and ureteral calculous obstruction: Principal | ICD-10-CM | POA: Insufficient documentation

## 2016-09-15 DIAGNOSIS — I10 Essential (primary) hypertension: Secondary | ICD-10-CM | POA: Diagnosis not present

## 2016-09-15 DIAGNOSIS — Z87442 Personal history of urinary calculi: Secondary | ICD-10-CM | POA: Diagnosis not present

## 2016-09-15 DIAGNOSIS — F329 Major depressive disorder, single episode, unspecified: Secondary | ICD-10-CM | POA: Insufficient documentation

## 2016-09-15 DIAGNOSIS — M545 Low back pain: Secondary | ICD-10-CM | POA: Diagnosis not present

## 2016-09-15 DIAGNOSIS — E669 Obesity, unspecified: Secondary | ICD-10-CM | POA: Diagnosis not present

## 2016-09-15 DIAGNOSIS — E119 Type 2 diabetes mellitus without complications: Secondary | ICD-10-CM | POA: Insufficient documentation

## 2016-09-15 DIAGNOSIS — E7800 Pure hypercholesterolemia, unspecified: Secondary | ICD-10-CM | POA: Diagnosis not present

## 2016-09-15 DIAGNOSIS — Z9851 Tubal ligation status: Secondary | ICD-10-CM | POA: Diagnosis not present

## 2016-09-15 DIAGNOSIS — Z6833 Body mass index (BMI) 33.0-33.9, adult: Secondary | ICD-10-CM | POA: Diagnosis not present

## 2016-09-15 DIAGNOSIS — G2581 Restless legs syndrome: Secondary | ICD-10-CM | POA: Insufficient documentation

## 2016-09-15 DIAGNOSIS — R319 Hematuria, unspecified: Secondary | ICD-10-CM

## 2016-09-15 DIAGNOSIS — G4730 Sleep apnea, unspecified: Secondary | ICD-10-CM | POA: Diagnosis not present

## 2016-09-15 DIAGNOSIS — G8929 Other chronic pain: Secondary | ICD-10-CM | POA: Diagnosis not present

## 2016-09-15 DIAGNOSIS — N200 Calculus of kidney: Secondary | ICD-10-CM

## 2016-09-15 DIAGNOSIS — N390 Urinary tract infection, site not specified: Secondary | ICD-10-CM

## 2016-09-15 DIAGNOSIS — Z419 Encounter for procedure for purposes other than remedying health state, unspecified: Secondary | ICD-10-CM

## 2016-09-15 LAB — URINALYSIS, ROUTINE W REFLEX MICROSCOPIC
Bilirubin Urine: NEGATIVE
Bilirubin Urine: NEGATIVE
GLUCOSE, UA: NEGATIVE mg/dL
Glucose, UA: NEGATIVE mg/dL
Ketones, ur: NEGATIVE mg/dL
Ketones, ur: NEGATIVE mg/dL
NITRITE: POSITIVE — AB
Nitrite: POSITIVE — AB
PH: 5 (ref 5.0–8.0)
Protein, ur: 30 mg/dL — AB
Protein, ur: 30 mg/dL — AB
SPECIFIC GRAVITY, URINE: 1.015 (ref 1.005–1.030)
SPECIFIC GRAVITY, URINE: 1.012 (ref 1.005–1.030)
pH: 5 (ref 5.0–8.0)

## 2016-09-15 LAB — I-STAT CHEM 8, ED
BUN: 14 mg/dL (ref 6–20)
CREATININE: 0.9 mg/dL (ref 0.44–1.00)
Calcium, Ion: 1.14 mmol/L — ABNORMAL LOW (ref 1.15–1.40)
Chloride: 107 mmol/L (ref 101–111)
GLUCOSE: 134 mg/dL — AB (ref 65–99)
HCT: 34 % — ABNORMAL LOW (ref 36.0–46.0)
HEMOGLOBIN: 11.6 g/dL — AB (ref 12.0–15.0)
POTASSIUM: 3.4 mmol/L — AB (ref 3.5–5.1)
Sodium: 137 mmol/L (ref 135–145)
TCO2: 18 mmol/L (ref 0–100)

## 2016-09-15 LAB — CBC
HEMATOCRIT: 34.6 % — AB (ref 36.0–46.0)
Hemoglobin: 11.5 g/dL — ABNORMAL LOW (ref 12.0–15.0)
MCH: 27.4 pg (ref 26.0–34.0)
MCHC: 33.2 g/dL (ref 30.0–36.0)
MCV: 82.6 fL (ref 78.0–100.0)
Platelets: 101 10*3/uL — ABNORMAL LOW (ref 150–400)
RBC: 4.19 MIL/uL (ref 3.87–5.11)
RDW: 15.7 % — AB (ref 11.5–15.5)
WBC: 2 10*3/uL — ABNORMAL LOW (ref 4.0–10.5)

## 2016-09-15 LAB — BASIC METABOLIC PANEL
Anion gap: 12 (ref 5–15)
BUN: 14 mg/dL (ref 6–20)
CALCIUM: 8.6 mg/dL — AB (ref 8.9–10.3)
CO2: 16 mmol/L — AB (ref 22–32)
CREATININE: 0.97 mg/dL (ref 0.44–1.00)
Chloride: 108 mmol/L (ref 101–111)
GFR calc non Af Amer: >60 mL/min (ref >60)
GLUCOSE: 132 mg/dL — AB (ref 65–99)
Potassium: 3.3 mmol/L — ABNORMAL LOW (ref 3.5–5.1)
Sodium: 136 mmol/L (ref 135–145)

## 2016-09-15 LAB — POC URINE PREG, ED: PREG TEST UR: NEGATIVE

## 2016-09-15 MED ORDER — HYDROMORPHONE HCL 1 MG/ML IJ SOLN
1.0000 mg | Freq: Once | INTRAMUSCULAR | Status: AC
  Administered 2016-09-15: 1 mg via INTRAVENOUS
  Filled 2016-09-15: qty 1

## 2016-09-15 MED ORDER — ONDANSETRON HCL 4 MG/2ML IJ SOLN
4.0000 mg | Freq: Once | INTRAMUSCULAR | Status: AC
Start: 2016-09-15 — End: 2016-09-15
  Administered 2016-09-15: 4 mg via INTRAVENOUS
  Filled 2016-09-15: qty 2

## 2016-09-15 MED ORDER — DEXTROSE 5 % IV SOLN
1.0000 g | Freq: Once | INTRAVENOUS | Status: AC
  Administered 2016-09-15: 1 g via INTRAVENOUS
  Filled 2016-09-15: qty 10

## 2016-09-15 MED ORDER — ONDANSETRON HCL 4 MG/2ML IJ SOLN
4.0000 mg | Freq: Once | INTRAMUSCULAR | Status: AC
  Administered 2016-09-15: 4 mg via INTRAVENOUS
  Filled 2016-09-15: qty 2

## 2016-09-15 MED ORDER — SODIUM CHLORIDE 0.9 % IV BOLUS (SEPSIS)
1000.0000 mL | Freq: Once | INTRAVENOUS | Status: AC
  Administered 2016-09-15: 1000 mL via INTRAVENOUS

## 2016-09-15 MED ORDER — MORPHINE SULFATE (PF) 2 MG/ML IV SOLN
4.0000 mg | Freq: Once | INTRAVENOUS | Status: AC
  Administered 2016-09-15: 4 mg via INTRAVENOUS
  Filled 2016-09-15: qty 2

## 2016-09-15 NOTE — ED Notes (Signed)
Patient transported to CT 

## 2016-09-15 NOTE — ED Provider Notes (Signed)
[facility]-EMERGENCY DEPT Provider Note   CSN: [id number] Arrival date & time: 09/15/16  1819     History   Chief Complaint Chief Complaint  Patient presents with  . Flank Pain    HPI Sandra [name] is a 46 y.o. female.  HPI  47 y.o. female with a hx of Atrial Fibrillation, HTN, HLD, Kidney Stones, presents to the Emergency Department today due to sudden onset right flank pain this afternoon. Notes hx same with kidney stones. Rates pain 8/10. Constant sensation isolated to right flank. Notes radiation to right groin. No fevers. No dysuria. Notes urinary urgency. No CP/SOB/ABD pain. No meds PTA. No other symptoms noted   Past Medical History:  Diagnosis Date  . Anemia    a. Mild with Hgb 11.2, 09/2012. UA with mod Hgb. Instructed to f/u PCP.  Marland Kitchen Anxiety   . Anxiety   . Atrial fibrillation with RVR ([facility])    a. Dx 09/2012, spont conv to NSR.  Marland Kitchen Chronic lower back pain   . Depression   . Gastroparesis   . GERD (gastroesophageal reflux disease)   . Gestational diabetes   . H/O echocardiogram    a. 02/2010: done for htn/tachy while pregnant - EF 55-60%, grade 1 d/dysf, mildly dilated LA.  . High cholesterol   . Hypertension   . Kidney stones   . Migraines   . Obesity   . Panic attack   . Restless legs   . Sleep apnea     Patient Active Problem List   Diagnosis Date Noted  . Essential hypertension 05/26/2016  . Accidental drug overdose   . Opiate overdose   . Sepsis (HCC) 03/13/2016  . Pyelonephritis 03/13/2016  . Depression 03/13/2016  . AKI (acute kidney injury) (HCC) 03/13/2016  . Hypokalemia 03/13/2016  . 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 Major depressive disorder, single episode, severe without psychosis ([facility]) 11/15/2014  . Altered mental status 01/15/2014  . Acute encephalopathy 02/13/2013  . Acute respiratory failure (HCC) 02/13/2013  . Aspiration pneumonia (HCC) 02/13/2013  . Lumbar  spondylosis 04/21/2011  . Trochanteric bursitis 04/21/2011  . Myofascial pain 04/21/2011  . TACHYCARDIA 03/06/2010  . DYSPNEA 01/06/2010  . RESTLESS LEG SYNDROME 12/11/2009  . MIGRAINE HEADACHE 12/11/2009  . GERD (gastroesophageal reflux disease) 12/11/2009  . Gastroparesis 12/11/2009  . DIABETES MELLITUS, GESTATIONAL, HX OF 12/11/2009    Past Surgical History:  Procedure Laterality Date  . BACK SURGERY    . CESAREAN SECTION  2012  . CYSTOSCOPY W/ URETERAL STENT PLACEMENT  2006  . CYSTOSCOPY/RETROGRADE/URETEROSCOPY/STONE EXTRACTION WITH BASKET  1987  . DILATION AND CURETTAGE OF UTERUS  2006; 2010  . TUBAL LIGATION  2012    OB History    No data available       Home Medications    Prior to Admission medications   Medication Sig Start Date End Date Taking? Authorizing Provider  citalopram (CELEXA) 20 MG tablet Take 1 tablet (20 mg total) by mouth daily. For depression 06/01/16   Armandina Stammer I, NP  hydrOXYzine (ATARAX/VISTARIL) 25 MG tablet Take 1 tablet (25 mg total) by mouth 3 (three) times daily as needed for anxiety. 05/31/16   Armandina Stammer I, NP  lisinopril (PRINIVIL,ZESTRIL) 20 MG tablet Take 1 tablet (20 mg total) by mouth daily. For high blood pressure 06/01/16   Armandina Stammer I, NP  metoprolol tartrate (LOPRESSOR) 25 MG tablet Take 0.5 tablets (12.5 mg total) by mouth  2 (two) times daily. For high blood pressure 05/31/16   [place], Nicole KindredAgnes I, NP  mirtazapine (REMERON) 15 MG tablet Take 1 tablet (15 mg total) by mouth at bedtime. For depression/sleep 05/31/16   Armandina StammerNwoko, Agnes I, NP  mupirocin ointment (BACTROBAN) 2 % Apply topically 2 (two) times daily. For wound care 05/31/16   Armandina StammerNwoko, Agnes I, NP  traZODone (DESYREL) 50 MG tablet Take 1 tablet (50 mg total) by mouth at bedtime as needed for sleep. 05/31/16   Sanjuana KavaNwoko, Agnes I, NP    Family History Family History  Problem Relation Age of Onset  . Diabetes Mother   . Hypertension Mother   . Migraines Father   . Migraines Brother       Social History Social History  Substance Use Topics  . Smoking status: Never Smoker  . Smokeless tobacco: Never Used  . Alcohol use No     Allergies   Iodides; Imitrex [sumatriptan]; Toradol [ketorolac tromethamine]; and Metoclopramide hcl   Review of Systems Review of Systems ROS reviewed and all are negative for acute change except as noted in the HPI.  Physical Exam Updated Vital Signs BP (!) 166/118 (BP Location: Left Arm)   Pulse (!) 112   Temp 98.2 F (36.8 C) (Oral)   Resp 18   Ht 5\' 4"  (1.626 m)   Wt 86.2 kg (190 lb)   LMP 09/01/2016   SpO2 100%   BMI 32.61 kg/m   Physical Exam  Constitutional: She is oriented to person, place, and time. Vital signs are normal. She appears well-developed and well-nourished. No distress.  HENT:  Head: Normocephalic and atraumatic.  Right Ear: Hearing, tympanic membrane, external ear and ear canal normal.  Left Ear: Hearing, tympanic membrane, external ear and ear canal normal.  Nose: Nose normal.  Mouth/Throat: Uvula is midline, oropharynx is clear and moist and mucous membranes are normal. No trismus in the jaw. No oropharyngeal exudate, posterior oropharyngeal erythema or tonsillar abscesses.  Eyes: Pupils are equal, round, and reactive to light. Conjunctivae and EOM are normal.  Neck: Normal range of motion. Neck supple. No tracheal deviation present.  Cardiovascular: Regular rhythm, S1 normal, S2 normal, normal heart sounds, intact distal pulses and normal pulses.  Tachycardia present.   Pulmonary/Chest: Effort normal and breath sounds normal. No respiratory distress. She has no decreased breath sounds. She has no wheezes. She has no rhonchi. She has no rales.  Abdominal: Normal appearance and bowel sounds are normal. There is no tenderness. There is CVA tenderness (right). There is no rigidity, no rebound, no guarding, no tenderness at McBurney's point and negative Murphy's sign.  Musculoskeletal: Normal range of motion.   Neurological: She is alert and oriented to person, place, and time.  Skin: Skin is warm and dry.  Psychiatric: She has a normal mood and affect. Her speech is normal and behavior is normal. Thought content normal.  Nursing note and vitals reviewed.  ED Treatments / Results  Labs (all labs ordered are listed, but only abnormal results are displayed) Labs Reviewed  URINALYSIS, ROUTINE W REFLEX MICROSCOPIC - Abnormal; Notable for the following:       Result Value   APPearance CLOUDY (*)    Hgb urine dipstick SMALL (*)    Protein, ur 30 (*)    Nitrite POSITIVE (*)    Leukocytes, UA LARGE (*)    Bacteria, UA MANY (*)    Squamous Epithelial / LPF 6-30 (*)    All other components within normal limits  URINALYSIS, ROUTINE W REFLEX MICROSCOPIC - Abnormal; Notable for the following:    Color, Urine RED (*)    APPearance HAZY (*)    Hgb urine dipstick MODERATE (*)    Protein, ur 30 (*)    Nitrite POSITIVE (*)    Leukocytes, UA LARGE (*)    Bacteria, UA MANY (*)    Squamous Epithelial / LPF 0-5 (*)    Non Squamous Epithelial 0-5 (*)    All other components within normal limits  CBC - Abnormal; Notable for the following:    WBC 2.0 (*)    Hemoglobin 11.5 (*)    HCT 34.6 (*)    RDW 15.7 (*)    Platelets 101 (*)    All other components within normal limits  BASIC METABOLIC PANEL - Abnormal; Notable for the following:    Potassium 3.3 (*)    CO2 16 (*)    Glucose, Bld 132 (*)    Calcium 8.6 (*)    All other components within normal limits  I-STAT CHEM 8, ED - Abnormal; Notable for the following:    Potassium 3.4 (*)    Glucose, Bld 134 (*)    Calcium, Ion 1.14 (*)    Hemoglobin 11.6 (*)    HCT 34.0 (*)    All other components within normal limits  POC URINE PREG, ED    EKG  EKG Interpretation None       Radiology Ct Renal Stone Study  Result Date: 09/15/2016 CLINICAL DATA:  Right flank pain, history of kidney stones EXAM: CT ABDOMEN AND PELVIS WITHOUT CONTRAST  TECHNIQUE: Multidetector CT imaging of the abdomen and pelvis was performed following the standard protocol without IV contrast. COMPARISON:  03/13/2016 FINDINGS: Lower chest: Lung bases are clear. Hepatobiliary: Unenhanced liver is unremarkable. Gallbladder is unremarkable. No intrahepatic or extrahepatic ductal dilatation. Pancreas: Within normal limits. Spleen: Within normal limits. Adrenals/Urinary Tract: Adrenal glands within normal limits. Hyperdense medullary pyramids bilaterally, suggesting medullary nephrocalcinosis. Additional nonobstructing bilateral renal calculi measuring up to 3 mm on the right. Moderate right hydroureteronephrosis with associated 7 mm proximal right ureteral calculus (coronal image 80). Bladder is within normal limits. Stomach/Bowel: Stomach is within normal limits. No evidence of bowel obstruction. Normal appendix (series 2/ image 68). Vascular/Lymphatic: No evidence of abdominal aortic aneurysm. Circumaortic left renal vein. No suspicious abdominopelvic lymphadenopathy. Reproductive: Uterus is within normal limits. Bilateral ovaries are within normal limits. Other: No abdominopelvic ascites. Musculoskeletal: Visualized osseous structures are within normal limits. IMPRESSION: 7 mm proximal right ureteral calculus. Moderate right hydroureteronephrosis. Bilateral nonobstructing renal calculi measuring up to 3 mm on the right. Associated medullary nephrocalcinosis. Electronically Signed   By: Charline BillsSriyesh  Krishnan M.D.   On: 09/15/2016 20:02    Procedures Procedures (including critical care time)  Medications Ordered in ED Medications  HYDROmorphone (DILAUDID) injection 1 mg (not administered)  morphine 2 MG/ML injection 4 mg (4 mg Intravenous Given 09/15/16 1939)  ondansetron (ZOFRAN) injection 4 mg (4 mg Intravenous Given 09/15/16 1940)  cefTRIAXone (ROCEPHIN) 1 g in dextrose 5 % 50 mL IVPB (0 g Intravenous Stopped 09/15/16 2209)  HYDROmorphone (DILAUDID) injection 1 mg (1 mg  Intravenous Given 09/15/16 2026)  sodium chloride 0.9 % bolus 1,000 mL (0 mLs Intravenous Stopped 09/15/16 2229)  HYDROmorphone (DILAUDID) injection 1 mg (1 mg Intravenous Given 09/15/16 2211)     Initial Impression / Assessment and Plan / ED Course  I have reviewed the triage vital signs and the nursing notes.  Pertinent labs & imaging  results that were available during my care of the patient were reviewed by me and considered in my medical decision making (see chart for details).  Final Clinical Impressions(s) / ED Diagnoses  {I have reviewed and evaluated the relevant laboratory values. {I have reviewed and evaluated the relevant imaging studies.  {I have reviewed the relevant previous healthcare records.  {I obtained HPI from historian.   ED Course:  Assessment: Pt is a 45 y.o. female hx of Atrial Fibrillation, HTN, HLD, Kidney Stones, presents to the Emergency Department today due to sudden onset right flank pain this afternoon. Notes hx same with kidney stones. Rates pain 8/10. Constant sensation isolated to right flank. Notes radiation to right groin. No fevers. No dysuria. Notes urinary urgency. No CP/SOB/ABD pain. No meds PTA. On exam, pt in NAD. Nontoxic/nonseptic appearing. VSS. Afebrile. Lungs CTA. Heart RRR. Abdomen nontender soft. Right CVA noted. UA with evidence of urinary tract infection. CT Renal with 7mm proximal ureteral calculus with moderate right hydro. Also bilateral nonobstructing renal calculi measuring 3mm on right right. CBC with WBC 2.0. Creatinine WNL. Given analgesia in ED. Consult Urology (Dr. Bobbye Riggs). Plan is to Admit.    Disposition/Plan:  Admit Pt acknowledges and agrees with plan  Supervising Physician Alvira Monday, MD  Final diagnoses:  Urinary tract infection with hematuria, site unspecified  Nephrolithiasis    New Prescriptions New Prescriptions   No medications on file     Audry Pili, Cordelia Poche 09/15/16 2344    Alvira Monday, MD 09/18/16  1221

## 2016-09-15 NOTE — ED Notes (Signed)
Pt alert and oriented x 4 found walking in room unable to stay seated d/t pain 10/10 to right side flank described as sharp/stabbing. Pt daughter is at bedside. Pt reports some nausea Zofran/ morphine administered.

## 2016-09-15 NOTE — ED Triage Notes (Signed)
Pt states that she had a sudden onset of R flank pain this afternoon. Frequent hx of kidney stones. Alert and oriented.

## 2016-09-16 ENCOUNTER — Encounter (HOSPITAL_COMMUNITY)
Admission: EM | Disposition: A | Discharge status: Home / Self Care | Payer: Self-pay | Source: Home / Self Care | Attending: Emergency Medicine

## 2016-09-16 ENCOUNTER — Emergency Department (HOSPITAL_COMMUNITY): Payer: Medicaid Other | Admitting: Certified Registered Nurse Anesthetist

## 2016-09-16 ENCOUNTER — Encounter (HOSPITAL_COMMUNITY): Payer: Self-pay | Admitting: Certified Registered Nurse Anesthetist

## 2016-09-16 ENCOUNTER — Emergency Department (HOSPITAL_COMMUNITY): Payer: Medicaid Other

## 2016-09-16 DIAGNOSIS — F329 Major depressive disorder, single episode, unspecified: Secondary | ICD-10-CM | POA: Diagnosis not present

## 2016-09-16 DIAGNOSIS — N200 Calculus of kidney: Secondary | ICD-10-CM

## 2016-09-16 DIAGNOSIS — N132 Hydronephrosis with renal and ureteral calculous obstruction: Secondary | ICD-10-CM | POA: Diagnosis not present

## 2016-09-16 DIAGNOSIS — G4730 Sleep apnea, unspecified: Secondary | ICD-10-CM | POA: Diagnosis not present

## 2016-09-16 DIAGNOSIS — I10 Essential (primary) hypertension: Secondary | ICD-10-CM | POA: Diagnosis not present

## 2016-09-16 HISTORY — PX: CYSTOSCOPY W/ URETERAL STENT PLACEMENT: SHX1429

## 2016-09-16 SURGERY — CYSTOSCOPY, WITH RETROGRADE PYELOGRAM AND URETERAL STENT INSERTION
Anesthesia: General | Site: Ureter | Laterality: Right

## 2016-09-16 MED ORDER — DIPHENHYDRAMINE HCL 50 MG/ML IJ SOLN
12.5000 mg | Freq: Once | INTRAMUSCULAR | Status: AC
  Administered 2016-09-16: 12.5 mg via INTRAVENOUS
  Filled 2016-09-16: qty 1

## 2016-09-16 MED ORDER — CIPROFLOXACIN HCL 500 MG PO TABS
500.0000 mg | ORAL_TABLET | Freq: Two times a day (BID) | ORAL | Status: DC
  Administered 2016-09-16 – 2016-09-17 (×3): 500 mg via ORAL
  Filled 2016-09-16 (×3): qty 1

## 2016-09-16 MED ORDER — CEFTRIAXONE SODIUM 1 G IJ SOLR
1.0000 g | Freq: Every day | INTRAMUSCULAR | Status: DC
  Administered 2016-09-16: 1 g via INTRAVENOUS
  Filled 2016-09-16: qty 10

## 2016-09-16 MED ORDER — MIDAZOLAM HCL 5 MG/5ML IJ SOLN
INTRAMUSCULAR | Status: DC | PRN
  Administered 2016-09-16: 2 mg via INTRAVENOUS

## 2016-09-16 MED ORDER — PROMETHAZINE HCL 25 MG/ML IJ SOLN: 6.2500 mg | INTRAMUSCULAR | Status: DC | PRN

## 2016-09-16 MED ORDER — SODIUM CHLORIDE 0.9 % IV SOLN: Freq: Once | INTRAVENOUS | Status: DC

## 2016-09-16 MED ORDER — ONDANSETRON HCL 4 MG/2ML IJ SOLN
4.0000 mg | INTRAMUSCULAR | Status: DC | PRN
Start: 2016-09-16 — End: 2016-09-17

## 2016-09-16 MED ORDER — FENTANYL CITRATE (PF) 100 MCG/2ML IJ SOLN
INTRAMUSCULAR | Status: DC | PRN
  Administered 2016-09-16: 50 ug via INTRAVENOUS
  Administered 2016-09-16: 100 ug via INTRAVENOUS
  Administered 2016-09-16: 50 ug via INTRAVENOUS

## 2016-09-16 MED ORDER — STERILE WATER FOR IRRIGATION IR SOLN
Status: DC | PRN
  Administered 2016-09-16: 3000 mL

## 2016-09-16 MED ORDER — BELLADONNA ALKALOIDS-OPIUM 16.2-60 MG RE SUPP: 1.0000 | Freq: Four times a day (QID) | RECTAL | Status: DC | PRN

## 2016-09-16 MED ORDER — FENTANYL CITRATE (PF) 100 MCG/2ML IJ SOLN
INTRAMUSCULAR | Status: AC
  Filled 2016-09-16: qty 2

## 2016-09-16 MED ORDER — HYDROMORPHONE HCL-NACL 0.5-0.9 MG/ML-% IV SOSY
0.5000 mg | PREFILLED_SYRINGE | INTRAVENOUS | Status: DC | PRN
Start: 2016-09-16 — End: 2016-09-16
  Administered 2016-09-16 (×3): 1 mg via INTRAVENOUS
  Administered 2016-09-16: 0.5 mg via INTRAVENOUS
  Administered 2016-09-16: 1 mg via INTRAVENOUS
  Filled 2016-09-16 (×4): qty 2
  Filled 2016-09-16: qty 1

## 2016-09-16 MED ORDER — SUCCINYLCHOLINE CHLORIDE 20 MG/ML IJ SOLN
INTRAMUSCULAR | Status: DC | PRN
  Administered 2016-09-16: 120 mg via INTRAVENOUS

## 2016-09-16 MED ORDER — PROPOFOL 10 MG/ML IV BOLUS
INTRAVENOUS | Status: DC | PRN
  Administered 2016-09-16: 150 mg via INTRAVENOUS

## 2016-09-16 MED ORDER — ONDANSETRON HCL 4 MG/2ML IJ SOLN
INTRAMUSCULAR | Status: DC | PRN
  Administered 2016-09-16: 4 mg via INTRAVENOUS

## 2016-09-16 MED ORDER — DOCUSATE SODIUM 100 MG PO CAPS
100.0000 mg | ORAL_CAPSULE | Freq: Two times a day (BID) | ORAL | Status: DC
  Administered 2016-09-16 – 2016-09-17 (×4): 100 mg via ORAL
  Filled 2016-09-16 (×4): qty 1

## 2016-09-16 MED ORDER — EPHEDRINE SULFATE-NACL 50-0.9 MG/10ML-% IV SOSY
PREFILLED_SYRINGE | INTRAVENOUS | Status: DC | PRN
  Administered 2016-09-16: 10 mg via INTRAVENOUS

## 2016-09-16 MED ORDER — CITALOPRAM HYDROBROMIDE 20 MG PO TABS
20.0000 mg | ORAL_TABLET | Freq: Every day | ORAL | Status: DC
  Administered 2016-09-17: 20 mg via ORAL
  Filled 2016-09-16: qty 1

## 2016-09-16 MED ORDER — ONDANSETRON HCL 4 MG PO TABS
4.0000 mg | ORAL_TABLET | Freq: Three times a day (TID) | ORAL | Status: DC | PRN
  Administered 2016-09-16 – 2016-09-17 (×2): 4 mg via ORAL
  Filled 2016-09-16 (×2): qty 1

## 2016-09-16 MED ORDER — METOPROLOL TARTRATE 25 MG PO TABS
25.0000 mg | ORAL_TABLET | Freq: Two times a day (BID) | ORAL | Status: DC
  Administered 2016-09-16 – 2016-09-17 (×3): 25 mg via ORAL
  Filled 2016-09-16 (×3): qty 1

## 2016-09-16 MED ORDER — IOHEXOL 300 MG/ML  SOLN
INTRAMUSCULAR | Status: DC | PRN
  Administered 2016-09-16: 50 mL

## 2016-09-16 MED ORDER — FENTANYL CITRATE (PF) 100 MCG/2ML IJ SOLN: 25.0000 ug | INTRAMUSCULAR | Status: DC | PRN

## 2016-09-16 MED ORDER — PHENAZOPYRIDINE HCL 200 MG PO TABS
200.0000 mg | ORAL_TABLET | Freq: Three times a day (TID) | ORAL | Status: DC | PRN
  Administered 2016-09-17: 200 mg via ORAL
  Filled 2016-09-16: qty 1

## 2016-09-16 MED ORDER — LACTATED RINGERS IV SOLN
INTRAVENOUS | Status: DC | PRN
  Administered 2016-09-16: 01:00:00 via INTRAVENOUS

## 2016-09-16 MED ORDER — DEXTROSE-NACL 5-0.45 % IV SOLN
INTRAVENOUS | Status: DC
  Administered 2016-09-16: 04:00:00 via INTRAVENOUS

## 2016-09-16 MED ORDER — SENNA 8.6 MG PO TABS
1.0000 | ORAL_TABLET | Freq: Two times a day (BID) | ORAL | Status: DC
  Administered 2016-09-16 – 2016-09-17 (×4): 8.6 mg via ORAL
  Filled 2016-09-16 (×4): qty 1

## 2016-09-16 MED ORDER — METOPROLOL TARTRATE 25 MG PO TABS
12.5000 mg | ORAL_TABLET | Freq: Two times a day (BID) | ORAL | Status: DC
  Administered 2016-09-16: 12.5 mg via ORAL
  Filled 2016-09-16: qty 1

## 2016-09-16 MED ORDER — MIDAZOLAM HCL 2 MG/2ML IJ SOLN
INTRAMUSCULAR | Status: AC
  Filled 2016-09-16: qty 2

## 2016-09-16 MED ORDER — OXYCODONE HCL 5 MG PO TABS
5.0000 mg | ORAL_TABLET | ORAL | Status: DC | PRN
  Administered 2016-09-16 – 2016-09-17 (×6): 5 mg via ORAL
  Filled 2016-09-16 (×8): qty 1

## 2016-09-16 MED ORDER — ACETAMINOPHEN 500 MG PO TABS
1000.0000 mg | ORAL_TABLET | Freq: Four times a day (QID) | ORAL | Status: DC
  Administered 2016-09-16 – 2016-09-17 (×6): 1000 mg via ORAL
  Filled 2016-09-16 (×6): qty 2

## 2016-09-16 MED ORDER — OXYBUTYNIN CHLORIDE ER 10 MG PO TB24
10.0000 mg | ORAL_TABLET | Freq: Every day | ORAL | Status: DC
  Administered 2016-09-16 – 2016-09-17 (×2): 10 mg via ORAL
  Filled 2016-09-16 (×2): qty 1

## 2016-09-16 MED ORDER — LIDOCAINE 2% (20 MG/ML) 5 ML SYRINGE
INTRAMUSCULAR | Status: DC | PRN
  Administered 2016-09-16: 60 mg via INTRAVENOUS

## 2016-09-16 MED ORDER — PHENYLEPHRINE 40 MCG/ML (10ML) SYRINGE FOR IV PUSH (FOR BLOOD PRESSURE SUPPORT)
PREFILLED_SYRINGE | INTRAVENOUS | Status: DC | PRN
  Administered 2016-09-16: 80 ug via INTRAVENOUS
  Administered 2016-09-16: 160 ug via INTRAVENOUS
  Administered 2016-09-16: 120 ug via INTRAVENOUS

## 2016-09-16 SURGICAL SUPPLY — 13 items
BAG URO CATCHER STRL LF (MISCELLANEOUS) ×3 IMPLANT
CATH INTERMIT  6FR 70CM (CATHETERS) ×3 IMPLANT
CLOTH BEACON ORANGE TIMEOUT ST (SAFETY) ×3 IMPLANT
COVER SURGICAL LIGHT HANDLE (MISCELLANEOUS) ×3 IMPLANT
GLOVE BIOGEL M STRL SZ7.5 (GLOVE) ×3 IMPLANT
GOWN STRL REUS W/TWL LRG LVL3 (GOWN DISPOSABLE) ×6 IMPLANT
GUIDEWIRE STR DUAL SENSOR (WIRE) ×3 IMPLANT
MANIFOLD NEPTUNE II (INSTRUMENTS) ×3 IMPLANT
PACK CYSTO (CUSTOM PROCEDURE TRAY) ×3 IMPLANT
STENT URET 6FRX24 CONTOUR (STENTS) ×2 IMPLANT
STENT URET 6FRX26 CONTOUR (STENTS) ×2 IMPLANT
TUBING CONNECTING 10 (TUBING) IMPLANT
TUBING CONNECTING 10' (TUBING)

## 2016-09-16 NOTE — Consult Note (Signed)
Urology Consult Note    Requesting Attending Physician:  [name], Sandra, Sandra [name] Service Requesting Consult:  Emergency Department  ServAlvira [name] Providing Consult: Urology  Consulting Attending: Dr. Alfredo MartinezScott Hadley [name]   Assessment:  Patient is a 46 y.o. female with history of nephrolithiasis presenting with right flank pain, nausea and vomiting, with CT imaging demonstrating an 8mm x 6mm obstructing right proximal ureteral stone and resulting moderate hydroureteronephrosis to the level of the stone.   UA concerning for infection, with positive nitrite, large leukocyte esterase, numerous WBCs and many bacteria in a relatively clean catch sample. Patient leukopenic with WBC 2.0, and tachycardic to 120-140s despite 1L NS bolus.   Given concern for infection in addition to obstructing stone with uncontrolled symptoms, will plan for cystourethroscopy, emergent right ureteral stent placement and right RPG. Risks, benefits of surgery discussed. Discussed small possibility of inability to bypass stone with stent, in which case patient would require a right PCN. I performed a history and physical examination of the patient and discussed his management with the resident.  I reviewed the resident's note and agree with the documented findings and plan of care    I performed a history and physical examination of the patient and discussed his management with the resident.  I reviewed the resident's note and agree with the documented findings and plan of care    After a thorough review of the management options for the patient's condition the patient  elected to proceed with surgical therapy as noted above. We have discussed the potential benefits and risks of the procedure, side effects of the proposed treatment, the likelihood of the patient achieving the goals of the procedure, and any potential problems that might occur during the procedure or recuperation. Informed consent has been  obtained.    Recommendations: 1. NPO for OR 2. Posted, consented, marked 3. Additional 1g ceftriaxone 4. Benadryl IV in anticipation of retrograde pyelogram due to hx of iodine allergy  5. Patient will be admitted post operatively for monitoring given concern for infection. .   Thank you for this consult. Please contact the urology consult pager with any further questions/concerns.  Sandra FieldingPauline [name], Sandra [name] Urology Surgical Resident  -----  Reason for Consult:  Nephrolithiasis   Sandra [name] is seen in consultation for reasons noted above at the request of Sandra [name], Sandra, Sandra [name] on the Emergency Department.   This is a 46 y.o. yo patient with a history of nephrolithiasis. Patient began having right flank pain at 6pm this evening. It worsened beyond control and she presented to the ED. Persisted despite significant amount of dilaudid administration. Endorses urinary urgency over last several months. Endorses nausea and vomiting x3. Unable to keep down clear liquids, nausea particularly with ambulation. Denies fevers chills at home. Denies chest pain, shortness of breath, lightheadedness. Denies gross hematuria, frequency, suprapubic pain.   Endorses long history of nephrolithiasis, last episode 2013. Endorses previous USE, ESWL, can't remember if she has had a PCNL or not.   She has a chronic history of nausea and vomiting.   Not on blood thinners. Has a history of afib with RVR.   Last ate at 4pm, last drank water at 6pm.    Past Medical History: Past Medical History:  Diagnosis Date  . Anemia    a. Mild with Hgb 11.2, 09/2012. UA with mod Hgb. Instructed to f/u PCP.  Marland Kitchen. Anxiety   . Anxiety   . Atrial fibrillation with RVR (HCC)    a. Dx 09/2012, spont conv to  NSR.  . Chronic lower back pain   . Depression   . Gastroparesis   . GERD (gastroesophageal reflux disease)   . Gestational diabetes   . H/O echocardiogram    a. 02/2010: done for htn/tachy while pregnant - EF 55-60%,  grade 1 d/dysf, mildly dilated LA.  . High cholesterol   . Hypertension   . Kidney stones   . Migraines   . Obesity   . Panic attack   . Restless legs   . Sleep apnea     Past Surgical History:  Past Surgical History:  Procedure Laterality Date  . BACK SURGERY    . CESAREAN SECTION  2012  . CYSTOSCOPY W/ URETERAL STENT PLACEMENT  2006  . CYSTOSCOPY/RETROGRADE/URETEROSCOPY/STONE EXTRACTION WITH BASKET  1987  . DILATION AND CURETTAGE OF UTERUS  2006; 2010  . TUBAL LIGATION  2012    Medication: No current facility-administered medications for this encounter.    Current Outpatient Prescriptions  Medication Sig Dispense Refill  . lisinopril (PRINIVIL,ZESTRIL) 20 MG tablet Take 1 tablet (20 mg total) by mouth daily. For high blood pressure 7 tablet 0  . metoprolol tartrate (LOPRESSOR) 25 MG tablet Take 0.5 tablets (12.5 mg total) by mouth 2 (two) times daily. For high blood pressure 10 tablet 0  . citalopram (CELEXA) 20 MG tablet Take 1 tablet (20 mg total) by mouth daily. For depression (Patient not taking: Reported on 09/15/2016) 30 tablet 0  . hydrOXYzine (ATARAX/VISTARIL) 25 MG tablet Take 1 tablet (25 mg total) by mouth 3 (three) times daily as needed for anxiety. (Patient not taking: Reported on 09/15/2016) 60 tablet 0  . mirtazapine (REMERON) 15 MG tablet Take 1 tablet (15 mg total) by mouth at bedtime. For depression/sleep (Patient not taking: Reported on 09/15/2016) 30 tablet 0  . mupirocin ointment (BACTROBAN) 2 % Apply topically 2 (two) times daily. For wound care (Patient not taking: Reported on 09/15/2016) 22 g 0  . traZODone (DESYREL) 50 MG tablet Take 1 tablet (50 mg total) by mouth at bedtime as needed for sleep. (Patient not taking: Reported on 09/15/2016) 30 tablet 0    Allergies: Allergies  Allergen Reactions  . Iodides Hives  . Imitrex [Sumatriptan]     Pt states she takes imitrex injection-"sometimes it works and sometimes doesn't"  . Toradol [Ketorolac Tromethamine]  Hives  . Metoclopramide Hcl Anxiety    Social History: Social History  Substance Use Topics  . Smoking status: Never Smoker  . Smokeless tobacco: Never Used  . Alcohol use No    Family History Family History  Problem Relation Age of Onset  . Diabetes Mother   . Hypertension Mother   . Migraines Father   . Migraines Brother     Review of Systems 10 systems were reviewed and are negative except as noted specifically in the HPI.  Objective   Vital signs in last 24 hours: BP (!) 156/99   Pulse (!) 123   Temp 98.3 F (36.8 C) (Oral)   Resp 18   Ht 5\' 4"  (1.626 m)   Wt 86.2 kg (190 lb)   LMP 09/01/2016   SpO2 98%   BMI 32.61 kg/m   Intake/Output last 3 shifts: No intake/output data recorded.  Physical Exam General: NAD, A&O, resting, appropriate HEENT: [place]/AT, EOMI, MMM Pulmonary: Normal work of breathing on room air  Cardiovascular: HDS, adequate peripheral perfusion Abdomen: soft, NTTP, nondistended, no suprapubic fullness or tenderness GU: right  CVA tenderness Extremities: warm and well perfused, no edema  Neuro: Appropriate, no focal neurological deficits  Most Recent Labs: Lab Results  Component Value Date   WBC 2.0 (L) 09/15/2016   HGB 11.6 (L) 09/15/2016   HCT 34.0 (L) 09/15/2016   PLT 101 (L) 09/15/2016    Lab Results  Component Value Date   NA 137 09/15/2016   K 3.4 (L) 09/15/2016   CL 107 09/15/2016   CO2 16 (L) 09/15/2016   BUN 14 09/15/2016   CREATININE 0.90 09/15/2016   CALCIUM 8.6 (L) 09/15/2016   MG 1.7 05/25/2016   PHOS 2.3 (L) 05/25/2016    Lab Results  Component Value Date   ALKPHOS 43 05/23/2016   BILITOT 0.3 05/23/2016   PROT 6.5 05/23/2016   ALBUMIN 3.2 (L) 05/23/2016   ALT 32 05/23/2016   AST 69 (H) 05/23/2016    Lab Results  Component Value Date   INR 1.05 02/13/2013   APTT 26 02/13/2013     Urine Culture: Pending   IMAGING: Ct Renal Stone Study  Result Date: 09/15/2016 CLINICAL DATA:  Right flank pain,  history of kidney stones EXAM: CT ABDOMEN AND PELVIS WITHOUT CONTRAST TECHNIQUE: Multidetector CT imaging of the abdomen and pelvis was performed following the standard protocol without IV contrast. COMPARISON:  03/13/2016 FINDINGS: Lower chest: Lung bases are clear. Hepatobiliary: Unenhanced liver is unremarkable. Gallbladder is unremarkable. No intrahepatic or extrahepatic ductal dilatation. Pancreas: Within normal limits. Spleen: Within normal limits. Adrenals/Urinary Tract: Adrenal glands within normal limits. Hyperdense medullary pyramids bilaterally, suggesting medullary nephrocalcinosis. Additional nonobstructing bilateral renal calculi measuring up to 3 mm on the right. Moderate right hydroureteronephrosis with associated 7 mm proximal right ureteral calculus (coronal image 80). Bladder is within normal limits. Stomach/Bowel: Stomach is within normal limits. No evidence of bowel obstruction. Normal appendix (series 2/ image 68). Vascular/Lymphatic: No evidence of abdominal aortic aneurysm. Circumaortic left renal vein. No suspicious abdominopelvic lymphadenopathy. Reproductive: Uterus is within normal limits. Bilateral ovaries are within normal limits. Other: No abdominopelvic ascites. Musculoskeletal: Visualized osseous structures are within normal limits. IMPRESSION: 7 mm proximal right ureteral calculus. Moderate right hydroureteronephrosis. Bilateral nonobstructing renal calculi measuring up to 3 mm on the right. Associated medullary nephrocalcinosis. Electronically Signed   By: Charline Bills M.D.   On: 09/15/2016 20:02

## 2016-09-16 NOTE — H&P (View-Only) (Signed)
Urology Consult Note    Requesting Attending Physician:  Schlossman, Erin, MD Service Requesting Consult:  Emergency Department  ServAlvira Mondayice Providing Consult: Urology  Consulting Attending: Dr. Alfredo MartinezScott Chany Woolworth   Assessment:  Patient is a 46 y.o. female with history of nephrolithiasis presenting with right flank pain, nausea and vomiting, with CT imaging demonstrating an 8mm x 6mm obstructing right proximal ureteral stone and resulting moderate hydroureteronephrosis to the level of the stone.   UA concerning for infection, with positive nitrite, large leukocyte esterase, numerous WBCs and many bacteria in a relatively clean catch sample. Patient leukopenic with WBC 2.0, and tachycardic to 120-140s despite 1L NS bolus.   Given concern for infection in addition to obstructing stone with uncontrolled symptoms, will plan for cystourethroscopy, emergent right ureteral stent placement and right RPG. Risks, benefits of surgery discussed. Discussed small possibility of inability to bypass stone with stent, in which case patient would require a right PCN. I performed a history and physical examination of the patient and discussed his management with the resident.  I reviewed the resident's note and agree with the documented findings and plan of care    I performed a history and physical examination of the patient and discussed his management with the resident.  I reviewed the resident's note and agree with the documented findings and plan of care    After a thorough review of the management options for the patient's condition the patient  elected to proceed with surgical therapy as noted above. We have discussed the potential benefits and risks of the procedure, side effects of the proposed treatment, the likelihood of the patient achieving the goals of the procedure, and any potential problems that might occur during the procedure or recuperation. Informed consent has been  obtained.    Recommendations: 1. NPO for OR 2. Posted, consented, marked 3. Additional 1g ceftriaxone 4. Benadryl IV in anticipation of retrograde pyelogram due to hx of iodine allergy  5. Patient will be admitted post operatively for monitoring given concern for infection. .   Thank you for this consult. Please contact the urology consult pager with any further questions/concerns.  [name], MD Urology Surgical Resident  -----  Reason for Consult:  Nephrolithiasis   Sandra [name] is seen in consultation for reasons noted above at the request of Sandra [name], Erin, MD on the Emergency Department.   This is a 46 y.o. yo patient with a history of nephrolithiasis. Patient began having right flank pain at 6pm this evening. It worsened beyond control and she presented to the ED. Persisted despite significant amount of dilaudid administration. Endorses urinary urgency over last several months. Endorses nausea and vomiting x3. Unable to keep down clear liquids, nausea particularly with ambulation. Denies fevers chills at home. Denies chest pain, shortness of breath, lightheadedness. Denies gross hematuria, frequency, suprapubic pain.   Endorses long history of nephrolithiasis, last episode 2013. Endorses previous USE, ESWL, can't remember if she has had a PCNL or not.   She has a chronic history of nausea and vomiting.   Not on blood thinners. Has a history of afib with RVR.   Last ate at 4pm, last drank water at 6pm.    Past Medical History: Past Medical History:  Diagnosis Date  . Anemia    a. Mild with Hgb 11.2, 09/2012. UA with mod Hgb. Instructed to f/u PCP.  Marland Kitchen. Anxiety   . Anxiety   . Atrial fibrillation with RVR (HCC)    a. Dx 09/2012, spont conv to  NSR.  . Chronic lower back pain   . Depression   . Gastroparesis   . GERD (gastroesophageal reflux disease)   . Gestational diabetes   . H/O echocardiogram    a. 02/2010: done for htn/tachy while pregnant - EF 55-60%,  grade 1 d/dysf, mildly dilated LA.  . High cholesterol   . Hypertension   . Kidney stones   . Migraines   . Obesity   . Panic attack   . Restless legs   . Sleep apnea     Past Surgical History:  Past Surgical History:  Procedure Laterality Date  . BACK SURGERY    . CESAREAN SECTION  2012  . CYSTOSCOPY W/ URETERAL STENT PLACEMENT  2006  . CYSTOSCOPY/RETROGRADE/URETEROSCOPY/STONE EXTRACTION WITH BASKET  1987  . DILATION AND CURETTAGE OF UTERUS  2006; 2010  . TUBAL LIGATION  2012    Medication: No current facility-administered medications for this encounter.    Current Outpatient Prescriptions  Medication Sig Dispense Refill  . lisinopril (PRINIVIL,ZESTRIL) 20 MG tablet Take 1 tablet (20 mg total) by mouth daily. For high blood pressure 7 tablet 0  . metoprolol tartrate (LOPRESSOR) 25 MG tablet Take 0.5 tablets (12.5 mg total) by mouth 2 (two) times daily. For high blood pressure 10 tablet 0  . citalopram (CELEXA) 20 MG tablet Take 1 tablet (20 mg total) by mouth daily. For depression (Patient not taking: Reported on 09/15/2016) 30 tablet 0  . hydrOXYzine (ATARAX/VISTARIL) 25 MG tablet Take 1 tablet (25 mg total) by mouth 3 (three) times daily as needed for anxiety. (Patient not taking: Reported on 09/15/2016) 60 tablet 0  . mirtazapine (REMERON) 15 MG tablet Take 1 tablet (15 mg total) by mouth at bedtime. For depression/sleep (Patient not taking: Reported on 09/15/2016) 30 tablet 0  . mupirocin ointment (BACTROBAN) 2 % Apply topically 2 (two) times daily. For wound care (Patient not taking: Reported on 09/15/2016) 22 g 0  . traZODone (DESYREL) 50 MG tablet Take 1 tablet (50 mg total) by mouth at bedtime as needed for sleep. (Patient not taking: Reported on 09/15/2016) 30 tablet 0    Allergies: Allergies  Allergen Reactions  . Iodides Hives  . Imitrex [Sumatriptan]     Pt states she takes imitrex injection-"sometimes it works and sometimes doesn't"  . Toradol [Ketorolac Tromethamine]  Hives  . Metoclopramide Hcl Anxiety    Social History: Social History  Substance Use Topics  . Smoking status: Never Smoker  . Smokeless tobacco: Never Used  . Alcohol use No    Family History Family History  Problem Relation Age of Onset  . Diabetes Mother   . Hypertension Mother   . Migraines Father   . Migraines Brother     Review of Systems 10 systems were reviewed and are negative except as noted specifically in the HPI.  Objective   Vital signs in last 24 hours: BP (!) 156/99   Pulse (!) 123   Temp 98.3 F (36.8 C) (Oral)   Resp 18   Ht 5\' 4"  (1.626 m)   Wt 86.2 kg (190 lb)   LMP 09/01/2016   SpO2 98%   BMI 32.61 kg/m   Intake/Output last 3 shifts: No intake/output data recorded.  Physical Exam General: NAD, A&O, resting, appropriate HEENT: [place]/AT, EOMI, MMM Pulmonary: Normal work of breathing on room air  Cardiovascular: HDS, adequate peripheral perfusion Abdomen: soft, NTTP, nondistended, no suprapubic fullness or tenderness GU: right  CVA tenderness Extremities: warm and well perfused, no edema  Neuro: Appropriate, no focal neurological deficits  Most Recent Labs: Lab Results  Component Value Date   WBC 2.0 (L) 09/15/2016   HGB 11.6 (L) 09/15/2016   HCT 34.0 (L) 09/15/2016   PLT 101 (L) 09/15/2016    Lab Results  Component Value Date   NA 137 09/15/2016   K 3.4 (L) 09/15/2016   CL 107 09/15/2016   CO2 16 (L) 09/15/2016   BUN 14 09/15/2016   CREATININE 0.90 09/15/2016   CALCIUM 8.6 (L) 09/15/2016   MG 1.7 05/25/2016   PHOS 2.3 (L) 05/25/2016    Lab Results  Component Value Date   ALKPHOS 43 05/23/2016   BILITOT 0.3 05/23/2016   PROT 6.5 05/23/2016   ALBUMIN 3.2 (L) 05/23/2016   ALT 32 05/23/2016   AST 69 (H) 05/23/2016    Lab Results  Component Value Date   INR 1.05 02/13/2013   APTT 26 02/13/2013     Urine Culture: Pending   IMAGING: Ct Renal Stone Study  Result Date: 09/15/2016 CLINICAL DATA:  Right flank pain,  history of kidney stones EXAM: CT ABDOMEN AND PELVIS WITHOUT CONTRAST TECHNIQUE: Multidetector CT imaging of the abdomen and pelvis was performed following the standard protocol without IV contrast. COMPARISON:  03/13/2016 FINDINGS: Lower chest: Lung bases are clear. Hepatobiliary: Unenhanced liver is unremarkable. Gallbladder is unremarkable. No intrahepatic or extrahepatic ductal dilatation. Pancreas: Within normal limits. Spleen: Within normal limits. Adrenals/Urinary Tract: Adrenal glands within normal limits. Hyperdense medullary pyramids bilaterally, suggesting medullary nephrocalcinosis. Additional nonobstructing bilateral renal calculi measuring up to 3 mm on the right. Moderate right hydroureteronephrosis with associated 7 mm proximal right ureteral calculus (coronal image 80). Bladder is within normal limits. Stomach/Bowel: Stomach is within normal limits. No evidence of bowel obstruction. Normal appendix (series 2/ image 68). Vascular/Lymphatic: No evidence of abdominal aortic aneurysm. Circumaortic left renal vein. No suspicious abdominopelvic lymphadenopathy. Reproductive: Uterus is within normal limits. Bilateral ovaries are within normal limits. Other: No abdominopelvic ascites. Musculoskeletal: Visualized osseous structures are within normal limits. IMPRESSION: 7 mm proximal right ureteral calculus. Moderate right hydroureteronephrosis. Bilateral nonobstructing renal calculi measuring up to 3 mm on the right. Associated medullary nephrocalcinosis. Electronically Signed   By: Charline Bills M.D.   On: 09/15/2016 20:02

## 2016-09-16 NOTE — Transfer of Care (Signed)
Immediate Anesthesia Transfer of Care Note  Patient: Sandra [name]  Procedure(s) Performed: Procedure(s): CYSTOSCOPY WITH RETROGRADE PYELOGRAM/URETERAL STENT PLACEMENT (Right)  Patient Location: PACU  Anesthesia Type:General  Level of Consciousness: awake, alert  and oriented  Airway & Oxygen Therapy: Patient Spontanous Breathing and Patient connected to face mask oxygen  Post-op Assessment: Report given to RN and Post -op Vital signs reviewed and stable  Post vital signs: Reviewed and stable  Last Vitals:  Vitals:   09/15/16 2247 09/16/16 0011  BP: (!) 156/99 (!) 159/92  Pulse: (!) 123 (!) 141  Resp: 18 18  Temp:      Last Pain:  Vitals:   09/16/16 0024  TempSrc:   PainSc: 10-Worst pain ever      Patients Stated Pain Goal: 2 (09/15/16 1940)  Complications: No apparent anesthesia complications

## 2016-09-16 NOTE — Progress Notes (Signed)
UROLOGY PROGRESS NOTES  Assessment:  Sandra [name] is a 46 y.o. female with a history of nephrolithiasis s/p right ureteral stent placement early 8/2 for an obstructing stone. UA + for concern for infection  Interval: Did well overnight, nausea improved. Pain/soreness present. Voided spontaneously   Plan:   - IVF, regular diet  - transition to cipro PO - await prelim gram stain, culture - oxycodone, tylenol for pain - tachycardia, CTM, on home metroprolol - if patient doing well and continues to be afebrile will d/c later this PM vs tomorrow   Agree with plan SMD   [name], MD Urologic Surgery Resident  -------     Objective:  Vital signs in last 24 hours: Temp:  [98.2 F (36.8 C)-99.8 F (37.7 C)] 99 F (37.2 C) (08/02 0304) Pulse Rate:  [83-141] 127 (08/02 0304) Resp:  [18-26] 24 (08/02 0304) BP: (116-173)/(79-118) 124/80 (08/02 0304) SpO2:  [95 %-100 %] 99 % (08/02 0304) Weight:  [86.2 kg (190 lb)-87.7 kg (193 lb 5.5 oz)] 87.7 kg (193 lb 5.5 oz) (08/02 0304)  08/01 0701 - 08/02 0700 In: 1845 [I.V.:745; IV Piggyback:1100] Out: -    Physical Exam:  General:  well-developed and well-nourished  in NAD, lying in bed, alert & oriented, pleasant HEENT: [place]/AT, EOMI, sclera anicteric, hearing grossly intact, no nasal discharge, MMM Respiratory: nonlabored respirations, satting well on RA, symmetrical chest rise Cardiovascular: appropriate perfusion  Abdominal: soft, NTTP, non-distended  GU: right flank tenderness  Extremities: warm, well-perfused, no c/c/e Neuro: no focal deficits  Data Review: Results for orders placed or performed during the hospital encounter of 09/15/16 (from the past 24 hour(s))  Urinalysis, Routine w reflex microscopic- may I&O cath if menses     Status: Abnormal   Collection Time: 09/15/16  6:33 PM  Result Value Ref Range   Color, Urine YELLOW YELLOW   APPearance CLOUDY (A) CLEAR   Specific Gravity, Urine 1.015 1.005 - 1.030   pH 5.0 5.0 - 8.0   Glucose, UA NEGATIVE NEGATIVE mg/dL   Hgb urine dipstick SMALL (A) NEGATIVE   Bilirubin Urine NEGATIVE NEGATIVE   Ketones, ur NEGATIVE NEGATIVE mg/dL   Protein, ur 30 (A) NEGATIVE mg/dL   Nitrite POSITIVE (A) NEGATIVE   Leukocytes, UA LARGE (A) NEGATIVE   RBC / HPF 6-30 0 - 5 RBC/hpf   WBC, UA TOO NUMEROUS TO COUNT 0 - 5 WBC/hpf   Bacteria, UA MANY (A) NONE SEEN   Squamous Epithelial / LPF 6-30 (A) NONE SEEN   Mucous PRESENT   POC urine preg, ED     Status: None   Collection Time: 09/15/16  6:38 PM  Result Value Ref Range   Preg Test, Ur NEGATIVE NEGATIVE  Urinalysis, Routine w reflex microscopic     Status: Abnormal   Collection Time: 09/15/16  8:10 PM  Result Value Ref Range   Color, Urine RED (A) YELLOW   APPearance HAZY (A) CLEAR   Specific Gravity, Urine 1.012 1.005 - 1.030   pH 5.0 5.0 - 8.0   Glucose, UA NEGATIVE NEGATIVE mg/dL   Hgb urine dipstick MODERATE (A) NEGATIVE   Bilirubin Urine NEGATIVE NEGATIVE   Ketones, ur NEGATIVE NEGATIVE mg/dL   Protein, ur 30 (A) NEGATIVE mg/dL   Nitrite POSITIVE (A) NEGATIVE   Leukocytes, UA LARGE (A) NEGATIVE   RBC / HPF 6-30 0 - 5 RBC/hpf   WBC, UA TOO NUMEROUS TO COUNT 0 - 5 WBC/hpf   Bacteria, UA MANY (A) NONE SEEN  Squamous Epithelial / LPF 0-5 (A) NONE SEEN   Mucous PRESENT    Hyaline Casts, UA PRESENT    Amorphous Crystal PRESENT    Non Squamous Epithelial 0-5 (A) NONE SEEN  CBC     Status: Abnormal   Collection Time: 09/15/16 10:46 PM  Result Value Ref Range   WBC 2.0 (L) 4.0 - 10.5 K/uL   RBC 4.19 3.87 - 5.11 MIL/uL   Hemoglobin 11.5 (L) 12.0 - 15.0 g/dL   HCT 16.134.6 (L) 09.636.0 - 04.546.0 %   MCV 82.6 78.0 - 100.0 fL   MCH 27.4 26.0 - 34.0 pg   MCHC 33.2 30.0 - 36.0 g/dL   RDW 40.915.7 (H) 81.111.5 - 91.415.5 %   Platelets 101 (L) 150 - 400 K/uL  Basic metabolic panel     Status: Abnormal   Collection Time: 09/15/16 10:46 PM  Result Value Ref Range   Sodium 136 135 - 145 mmol/L   Potassium 3.3 (L) 3.5 - 5.1  mmol/L   Chloride 108 101 - 111 mmol/L   CO2 16 (L) 22 - 32 mmol/L   Glucose, Bld 132 (H) 65 - 99 mg/dL   BUN 14 6 - 20 mg/dL   Creatinine, Ser 7.820.97 0.44 - 1.00 mg/dL   Calcium 8.6 (L) 8.9 - 10.3 mg/dL   GFR calc non Af Amer >60 >60 mL/min   GFR calc Af Amer >60 >60 mL/min   Anion gap 12 5 - 15  I-Stat Chem 8, ED     Status: Abnormal   Collection Time: 09/15/16 10:57 PM  Result Value Ref Range   Sodium 137 135 - 145 mmol/L   Potassium 3.4 (L) 3.5 - 5.1 mmol/L   Chloride 107 101 - 111 mmol/L   BUN 14 6 - 20 mg/dL   Creatinine, Ser 9.560.90 0.44 - 1.00 mg/dL   Glucose, Bld 213134 (H) 65 - 99 mg/dL   Calcium, Ion 0.861.14 (L) 1.15 - 1.40 mmol/L   TCO2 18 0 - 100 mmol/L   Hemoglobin 11.6 (L) 12.0 - 15.0 g/dL   HCT 57.834.0 (L) 46.936.0 - 62.946.0 %

## 2016-09-16 NOTE — Anesthesia Procedure Notes (Signed)
Procedure Name: Intubation Performed by: Gean Maidens Pre-anesthesia Checklist: Patient identified, Emergency Drugs available, Suction available, Patient being monitored and Timeout performed Patient Re-evaluated:Patient Re-evaluated prior to induction Oxygen Delivery Method: Circle system utilized Preoxygenation: Pre-oxygenation with 100% oxygen Induction Type: IV induction Ventilation: Mask ventilation without difficulty Laryngoscope Size: Mac and 4 Grade View: Grade II Tube type: Oral Tube size: 7.0 mm Number of attempts: 1 Airway Equipment and Method: Stylet Placement Confirmation: ETT inserted through vocal cords under direct vision,  CO2 detector,  breath sounds checked- equal and bilateral and positive ETCO2 Secured at: 21 cm Tube secured with: Tape Dental Injury: Teeth and Oropharynx as per pre-operative assessment

## 2016-09-16 NOTE — Op Note (Signed)
Preoperative Diagnosis: Right obstructing nephrolithiasis   Postoperative Diagnosis:  Same  Procedure(s) Performed:    1. Cystourethroscopy 2. Right ureteral stent placement, 6Fr x 26cm JJ ureteral without dangler 3. Right retrograde pyelogram 4. Intraoperative fluoroscopy with interpretation <1hr.   Teaching Surgeon:  Alfredo MartinezScott Kabrina Christiano, MD  Resident Surgeon:  [name], MD  Assistant(s):  None  Anesthesia:  General via endotracheal tube   Fluids:  See anesthesia record  Estimated blood loss:  None mL  Specimens:  Right renal pelvis urine  Cultures:  Gram stain, culture   Drains:   RIGHT 6Fr x 26cm JJ ureteral stent without dangler  Complications:  None  Indications: 46 y.o. female  with a past medical history of nephrolithiasis presented with right obstructing ureteral stone and concern for infection. She presents for cystourethroscopy and right ureteral stent placement. Risks & benefits of the procedure discussed with the patient, who wishes to proceed.  Findings: Normal appearing bladder. Right ureteral orifice in gross anatomic position. Successful placement of right ureteral stent.   Radiologic Interpretation of Retrograde Pyelogram: Right retrograde pyelogram demonstrated no contrast extravasation, filling defects with moderate evidence of hydroureteronephrosis.  Description:  The patient was correctly identified in the preop holding area where written informed consent as well potential risk and complication reviewed. The patient agreed. They were brought back to the operative suite where a preinduction timeout was performed. Once correct information was verified, general anesthesia was induced via endotracheal tube. They were then gently placed into dorsal lithotomy position with SCDs in place for VTE prophylaxis. They were prepped and draped in the usual sterile fashion. She had already received ceftriaxone 2g in the ED and did not require redosing. A second  timeout was then performed.   We inserted a 55F rigid cystoscope per urethra with copious lubrication and normal saline irrigation running. This demonstrated a normal urethra, clear yellow urine on entry to the bladder, no bladder mucosal irregularities, tumors, stones or foreign bodies.  We turned our attention to the right ureteral orifice which we cannulated with a combination of a 5 [place] open-ended catheter and a sensor wire. The sensor wire was advanced to the presumed level of the left renal pelvis where an appropriate curl was noted under fluoroscopy without difficulty. The 5 [place] open-ended catheter was advanced into the right proximal ureter and the sensor wire was removed.   Hydronephrotic drip was collected from the 5Fr open ended catheter and sent for urine gram stain and urine culture.  A right retrograde pyelogram was performed with findings as noted above. We then replaced the sensor wire into the right kidney under fluoroscopic guidance without difficulty and removed the 5 [place] open-ended catheter. We advanced a 6Fr x 26cm JJ ureteral without dangler over the wire with the assistance of a stent pusher under direct visual and fluoroscopic guidance without difficulty. Sensor wire removal demonstrated satisfactory stent curl noted proximally in the right renal pelvis as well as distally within the bladder.  The bladder was empty and all instrumentation was removed. The patient was woken up from anesthesia and taken to the recovery unit for routine postoperative care.  Post Op Plan:   1. Admit to urology service for observation  Attestation:  Dr. Sherron MondayMacDiarmid was present and scrubbed for the entirety of the procedure.  I assisted on all steps  [name], MD Resident, Department of Urology

## 2016-09-16 NOTE — Anesthesia Preprocedure Evaluation (Signed)
Anesthesia Evaluation  Patient identified by MRN, date of birth, ID band Patient awake    Reviewed: Allergy & Precautions, NPO status , Patient's Chart, lab work & pertinent test results  Airway Mallampati: II  TM Distance: >3 FB Neck ROM: Full    Dental no notable dental hx.    Pulmonary sleep apnea ,    Pulmonary exam normal breath sounds clear to auscultation       Cardiovascular hypertension,  Rhythm:Regular Rate:Tachycardia     Neuro/Psych Depression negative neurological ROS     GI/Hepatic negative GI ROS, Neg liver ROS, gastroparesis   Endo/Other  diabetes  Renal/GU negative Renal ROS  negative genitourinary   Musculoskeletal negative musculoskeletal ROS (+)   Abdominal   Peds negative pediatric ROS (+)  Hematology  (+) anemia ,   Anesthesia Other Findings   Reproductive/Obstetrics negative OB ROS                             Anesthesia Physical Anesthesia Plan  ASA: III and emergent  Anesthesia Plan: General   Post-op Pain Management:    Induction: Intravenous  PONV Risk Score and Plan: 2 and Ondansetron, Dexamethasone and Treatment may vary due to age or medical condition  Airway Management Planned: Oral ETT  Additional Equipment:   Intra-op Plan:   Post-operative Plan: Extubation in OR  Informed Consent: I have reviewed the patients History and Physical, chart, labs and discussed the procedure including the risks, benefits and alternatives for the proposed anesthesia with the patient or authorized representative who has indicated his/her understanding and acceptance.   Dental advisory given  Plan Discussed with: CRNA and Surgeon  Anesthesia Plan Comments:         Anesthesia Quick Evaluation

## 2016-09-16 NOTE — Interval H&P Note (Signed)
History and Physical Interval Note:  09/16/2016 1:23 AM  Sandra [name]  has presented today for surgery, with the diagnosis of right ureteral stone  The various methods of treatment have been discussed with the patient and family. After consideration of risks, benefits and other options for treatment, the patient has consented to  Procedure(s): CYSTOSCOPY WITH RETROGRADE PYELOGRAM/URETERAL STENT PLACEMENT (Right) as a surgical intervention .  The patient's history has been reviewed, patient examined, no change in status, stable for surgery.  I have reviewed the patient's chart and labs.  Questions were answered to the patient's satisfaction.     Jessah Danser A

## 2016-09-17 MED ORDER — OXYCODONE HCL 5 MG PO TABS: 5.0000 mg | ORAL_TABLET | ORAL | 0 refills | Status: DC | PRN

## 2016-09-17 MED ORDER — ONDANSETRON HCL 4 MG PO TABS: 4.0000 mg | ORAL_TABLET | Freq: Three times a day (TID) | ORAL | 0 refills | Status: DC | PRN

## 2016-09-17 MED ORDER — OXYBUTYNIN CHLORIDE ER 10 MG PO TB24: 10.0000 mg | ORAL_TABLET | Freq: Every day | ORAL | 0 refills | Status: DC

## 2016-09-17 MED ORDER — PHENAZOPYRIDINE HCL 200 MG PO TABS
200.0000 mg | ORAL_TABLET | Freq: Three times a day (TID) | ORAL | 0 refills | Status: DC | PRN
Start: 2016-09-17 — End: 2017-10-15

## 2016-09-17 MED ORDER — CIPROFLOXACIN HCL 500 MG PO TABS: 500.0000 mg | ORAL_TABLET | Freq: Two times a day (BID) | ORAL | 0 refills | Status: AC

## 2016-09-17 MED ORDER — BELLADONNA-OPIUM 16.2-30 MG RE SUPP: 30.0000 mg | Freq: Four times a day (QID) | RECTAL | Status: DC | PRN

## 2016-09-17 MED ORDER — TAMSULOSIN HCL 0.4 MG PO CAPS: 0.4000 mg | ORAL_CAPSULE | Freq: Every day | ORAL | 0 refills | Status: DC

## 2016-09-17 NOTE — Anesthesia Postprocedure Evaluation (Signed)
Anesthesia Post Note  Patient: Gladyce K Lainez  Procedure(s) Performed: Procedure(s) (LRB): CYSTOSCOPY WITH RETROGRADE PYELOGRAM/URETERAL STENT PLACEMENT (Right)     Patient location during evaluation: PACU Anesthesia Type: General Level of consciousness: awake and alert Pain management: pain level controlled Vital Signs Assessment: post-procedure vital signs reviewed and stable Respiratory status: spontaneous breathing, nonlabored ventilation, respiratory function stable and patient connected to nasal cannula oxygen Cardiovascular status: blood pressure returned to baseline and stable Postop Assessment: no signs of nausea or vomiting Anesthetic complications: no    Last Vitals:  Vitals:   09/16/16 1948 09/17/16 0600  BP: 108/66 117/76  Pulse: 86 93  Resp: 18 18  Temp: 36.7 C 36.8 C    Last Pain:  Vitals:   09/17/16 0744  TempSrc:   PainSc: 5                  Lummie Montijo S

## 2016-09-17 NOTE — Discharge Summary (Signed)
[facility] Discharge Summary  Admit date: 09/15/2016  Discharge date and time: 09/17/16   Discharge to: Home  Discharge Service: Urology  Discharge Attending Physician:  Dr. Lorin PicketScott MacDiarmid  Discharge  Diagnoses: Nephrolithiasis   Secondary Diagnosis: Active Problems:   Nephrolithiasis   OR Procedures: Procedure(s): CYSTOSCOPY WITH RETROGRADE PYELOGRAM/URETERAL STENT PLACEMENT 09/15/2016 - 09/16/2016   Ancillary Procedures: None   Discharge Day Services: The patient was seen and examined by the Urology team both in the morning and immediately prior to discharge.  Vital signs and laboratory values were stable and within normal limits.  The physical exam was benign and unchanged and all surgical wounds were examined.  Discharge instructions were explained and all questions answered.  Subjective  No acute events overnight. Pain Controlled. No fever or chills.  Objective Patient Vitals for the past 8 hrs:  BP Temp Temp src Pulse Resp SpO2  09/17/16 0600 117/76 98.3 F (36.8 C) Oral 93 18 100 %   No intake/output data recorded.  General Appearance:        No acute distress Lungs:                 Normal work of breathing on room air Heart:                             Regular rate and rhythm Abdomen:                       Soft, non-tender, non-distended Extremities:                  Warm and well perfused    Hospital Course:  The patient underwent right ureteral stent placement on 09/15/16 for an obstructing stone and concern for UTI.   The patient tolerated the procedure well, was extubated in the OR, and afterwards was taken to the PACU for routine post-surgical care. When stable the patient was transferred to the floor.   The patient did well postoperatively.  The patient's diet was slowly advanced and at the time of discharge was tolerating a regular diet.  The patient was discharged home 1 Day Post-Op, at which point was tolerating a regular solid diet, was able to void  spontaneously, have adequate pain control with P.O. pain medication, and could ambulate without difficulty. Urine culture from admission was growing E coli, sensitivities pending. Patient was discharged to complete a 10 day course of ciprofloxacin, sensitivities will be followed up. She was transitioned to this on the morning of POD0 and was afebrile for 24 hours at time of d/c,  The patient will follow up with us for post op check.   Condition at Discharge: Improved  Discharge Medications:  Allergies as of 09/17/2016      Reactions   Iodides Hives   Imitrex [sumatriptan]    Pt states she takes imitrex injection-"sometimes it works and sometimes doesn't"   Toradol [ketorolac Tromethamine] Hives   Metoclopramide Hcl Anxiety      Medication List    TAKE these medications   ciprofloxacin 500 MG tablet Commonly known as:  CIPRO Take 1 tablet (500 mg total) by mouth 2 (two) times daily.   citalopram 20 MG tablet Commonly known as:  CELEXA Take 1 tablet (20 mg total) by mouth daily. For depression   hydrOXYzine 25 MG tablet Commonly known as:  ATARAX/VISTARIL Take 1 tablet (25 mg total) by mouth 3 (three) times daily  as needed for anxiety.   lisinopril 20 MG tablet Commonly known as:  PRINIVIL,ZESTRIL Take 1 tablet (20 mg total) by mouth daily. For high blood pressure   metoprolol tartrate 25 MG tablet Commonly known as:  LOPRESSOR Take 0.5 tablets (12.5 mg total) by mouth 2 (two) times daily. For high blood pressure   mirtazapine 15 MG tablet Commonly known as:  REMERON Take 1 tablet (15 mg total) by mouth at bedtime. For depression/sleep   mupirocin ointment 2 % Commonly known as:  BACTROBAN Apply topically 2 (two) times daily. For wound care   ondansetron 4 MG tablet Commonly known as:  ZOFRAN Take 1 tablet (4 mg total) by mouth every 8 (eight) hours as needed for nausea or vomiting.   oxybutynin 10 MG 24 hr tablet Commonly known as:  DITROPAN-XL Take 1 tablet (10 mg  total) by mouth daily.   oxyCODONE 5 MG immediate release tablet Commonly known as:  Oxy IR/ROXICODONE Take 1 tablet (5 mg total) by mouth every 4 (four) hours as needed for moderate pain.   phenazopyridine 200 MG tablet Commonly known as:  PYRIDIUM Take 1 tablet (200 mg total) by mouth 3 (three) times daily as needed (dsyuria).   tamsulosin 0.4 MG Caps capsule Commonly known as:  FLOMAX Take 1 capsule (0.4 mg total) by mouth daily after supper.   traZODone 50 MG tablet Commonly known as:  DESYREL Take 1 tablet (50 mg total) by mouth at bedtime as needed for sleep.        Pending Test Results: urine culture  Discharge Instructions:  Discharge Instructions    Diet general    Complete by:  As directed    Discharge instructions    Complete by:  As directed    You may see some blood in the urine and may have some burning with urination for 48-72 hours. You also may notice that you have to urinate more frequently or urgently after your procedure which is normal.  You should call should you develop an inability urinate, fever > 101, persistent nausea and vomiting that prevents you from eating or drinking to stay hydrated.  If you have a stent, you will likely urinate more frequently and urgently until the stent is removed and you may experience some discomfort/pain in the lower abdomen and flank especially when urinating. You may take pain medication prescribed to you if needed for pain. You may also intermittently have blood in the urine until the stent is removed.   Increase activity slowly    Complete by:  As directed

## 2016-09-18 LAB — URINE CULTURE

## 2016-09-21 ENCOUNTER — Other Ambulatory Visit: Payer: Self-pay | Admitting: Urology

## 2016-09-21 ENCOUNTER — Encounter (HOSPITAL_COMMUNITY): Payer: Self-pay | Admitting: Urology

## 2016-09-23 ENCOUNTER — Encounter (HOSPITAL_COMMUNITY)
Admission: RE | Admit: 2016-09-23 | Discharge: 2016-09-23 | Disposition: A | Discharge status: Home / Self Care | Payer: Medicaid Other | Source: Ambulatory Visit | Attending: Urology | Admitting: Urology

## 2016-09-23 ENCOUNTER — Encounter (HOSPITAL_COMMUNITY): Payer: Self-pay

## 2016-09-23 DIAGNOSIS — Z01812 Encounter for preprocedural laboratory examination: Secondary | ICD-10-CM | POA: Diagnosis present

## 2016-09-23 DIAGNOSIS — Z01810 Encounter for preprocedural cardiovascular examination: Secondary | ICD-10-CM | POA: Insufficient documentation

## 2016-09-23 DIAGNOSIS — N201 Calculus of ureter: Secondary | ICD-10-CM | POA: Insufficient documentation

## 2016-09-23 LAB — CBC
HEMATOCRIT: 32.7 % — AB (ref 36.0–46.0)
Hemoglobin: 10.9 g/dL — ABNORMAL LOW (ref 12.0–15.0)
MCH: 27.5 pg (ref 26.0–34.0)
MCHC: 33.3 g/dL (ref 30.0–36.0)
MCV: 82.4 fL (ref 78.0–100.0)
PLATELETS: 278 10*3/uL (ref 150–400)
RBC: 3.97 MIL/uL (ref 3.87–5.11)
RDW: 15.1 % (ref 11.5–15.5)
WBC: 7.4 10*3/uL (ref 4.0–10.5)

## 2016-09-23 LAB — BASIC METABOLIC PANEL
Anion gap: 9 (ref 5–15)
BUN: 12 mg/dL (ref 6–20)
CALCIUM: 9.1 mg/dL (ref 8.9–10.3)
CO2: 27 mmol/L (ref 22–32)
CREATININE: 0.96 mg/dL (ref 0.44–1.00)
Chloride: 104 mmol/L (ref 101–111)
GFR calc Af Amer: >60 mL/min (ref >60)
GLUCOSE: 135 mg/dL — AB (ref 65–99)
Potassium: 3.4 mmol/L — ABNORMAL LOW (ref 3.5–5.1)
SODIUM: 140 mmol/L (ref 135–145)

## 2016-09-23 LAB — HCG, SERUM, QUALITATIVE: PREG SERUM: NEGATIVE

## 2016-09-23 NOTE — Patient Instructions (Addendum)
Sandra [name]  09/23/2016   Your procedure is scheduled on: 09/28/16    [facility]  Entrance Take [place]  elevators to 3rd floor to  Short Stay Center at   1000 AM.    Call this number if you have problems the morning of surgery [phone]    Remember: ONLY 1 PERSON MAY GO WITH YOU TO SHORT STAY TO GET  READY MORNING OF YOUR SURGERY.  Do not eat food or drink liquids :After Midnight.     Take these medicines the morning of surgery with A SIP OF WATER: Metoprolol ( Lopressor), Oxycodone if needed, Vesicare, Pyridium if needed                                 You may not have any metal on your body including hair pins and              piercings  Do not wear jewelry, make-up, lotions, powders or perfumes, deodorant             Do not wear nail polish.  Do not shave  48 hours prior to surgery.                 Do not bring valuables to the hospital. [facility] IS NOT             RESPONSIBLE   FOR VALUABLES.  Contacts, dentures or bridgework may not be worn into surgery.      Patients discharged the day of surgery will not be allowed to drive home.  Name and phone number of your driver: Sandra SchlichterJane [name] [phone]                Please read over the following fact sheets you were given: _____________________________________________________________________             [facility] - Pre[facility] Before surgery, you can play an important role.  Because skin is not sterile, your skin needs to be as free of germs as possible.  You can reduce the number of germs on your skin by washing with CHG (chlorahexidine gluconate) soap before surgery.  CHG is an antiseptic cleaner which kills germs and bonds with the skin to continue killing germs even after washing. Please DO NOT use if you have an allergy to CHG or antibacterial soaps.  If your skin becomes reddened/irritated stop using the CHG and inform your nurse when you arrive at Short Stay. Do  not shave (including legs and underarms) for at least 48 hours prior to the first CHG shower.  You may shave your face/neck. Please follow these instructions carefully:  1.  Shower with CHG Soap the night before surgery and the  morning of Surgery.  2.  If you choose to wash your hair, wash your hair first as usual with your  normal  shampoo.  3.  After you shampoo, rinse your hair and body thoroughly to remove the  shampoo.                           4.  Use CHG as you would any other liquid soap.  You can apply chg directly  to the skin and wash  Gently with a scrungie or clean washcloth.  5.  Apply the CHG Soap to your body ONLY FROM THE NECK DOWN.   Do not use on face/ open                           Wound or open sores. Avoid contact with eyes, ears mouth and genitals (private parts).                       Wash face,  Genitals (private parts) with your normal soap.             6.  Wash thoroughly, paying special attention to the area where your surgery  will be performed.  7.  Thoroughly rinse your body with warm water from the neck down.  8.  DO NOT shower/wash with your normal soap after using and rinsing off  the CHG Soap.                9.  Pat yourself dry with a clean towel.            10.  Wear clean pajamas.            11.  Place clean sheets on your bed the night of your first shower and do not  sleep with pets. Day of Surgery : Do not apply any lotions/deodorants the morning of surgery.  Please wear clean clothes to the hospital/surgery center.  FAILURE TO FOLLOW THESE INSTRUCTIONS MAY RESULT IN THE CANCELLATION OF YOUR SURGERY PATIENT SIGNATURE_________________________________  NURSE SIGNATURE__________________________________  ________________________________________________________________________

## 2016-09-23 NOTE — Progress Notes (Signed)
05-23-16 (EPIC) EKG 05-24-16 (EPIC) ECHO 55-60% EF, no stenosis, no regurgitation, CXR 09-15-16 (EPIC) BMP (abnormal), CBC

## 2016-09-27 NOTE — H&P (Signed)
CC: I have ureteral stone.  HPI: Sandra [name] is a 46 year-old female established patient who is here for ureteral stone.  The problem is on the right side. She first stated noticing pain on 09/15/2016. This is not her first kidney stone. She is currently having flank pain. She denies having back pain, groin pain, nausea, vomiting, fever, and chills. Pain is occuring on the right side. She has not caught a stone in her urine strainer since her symptoms began.   She has had ureteral stent for treatment of her stones in the past.   Sandra [name] had a right ureteral stent placed on 8/1 for a 7mm proximal stone with infection. She had small bilateral renal stones with nephrocalcinosis. She has stent related flank pain bladder pain.      ALLERGIES: Contrast Dye Imitrex Iodine - Hives Reglan - Hives    MEDICATIONS: Lisinopril 40 mg tablet  Metoprolol Tartrate  Tamsulosin Hcl 0.4 mg capsule, ext release 24 hr  Ciprofloxacin Hcl 500 mg tablet  Melatonin  Tums  Tylenol     GU PSH: None     PSH Notes: multiple kidney stone surgery procedures   NON-GU PSH: C-Section D&&C Deviated Septum Surgery    GU PMH: Kidney Failure Unspec    NON-GU PMH: Anxiety Arthritis Atrial Fibrillation Depression Diabetes Type 2 GERD Hypercholesterolemia Hypertension Sleep Apnea    FAMILY HISTORY: Diabetes - Mother Hypertension - Brother, Mother Migraine Headaches - Brother, Father stroke - Mother    Notes: 2 sons; 4 daughters   SOCIAL HISTORY: Marital Status: Single Preferred Language: English; Ethnicity: Not Hispanic Or Latino; Race: White Current Smoking Status: Patient has never smoked.   Tobacco Use Assessment Completed: Used Tobacco in last 30 days? Does not use smokeless tobacco. Has never drank.  Drinks 3 caffeinated drinks per day. 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    REVIEW OF SYSTEMS:    GU Review Female:   Patient reports frequent urination, hard to postpone urination,  burning /pain with urination, and get up at night to urinate. Patient denies leakage of urine, stream starts and stops, trouble starting your stream, have to strain to urinate, and being pregnant.  Gastrointestinal (Upper):   Patient reports nausea, vomiting, and indigestion/ heartburn.   Gastrointestinal (Lower):   Patient denies constipation and diarrhea.  Constitutional:   Patient reports night sweats and fatigue. Patient denies fever and weight loss.  Skin:   Patient reports itching. Patient denies skin rash/ lesion.  Eyes:   Patient denies blurred vision and double vision.  Ears/ Nose/ Throat:   Patient reports sinus problems. Patient denies sore throat.  Hematologic/Lymphatic:   Patient reports easy bruising. Patient denies swollen glands.  Cardiovascular:   Patient denies leg swelling and chest pains.  Respiratory:   Patient denies cough and shortness of breath.  Endocrine:   Patient reports excessive thirst.   Musculoskeletal:   Patient reports back pain. Patient denies joint pain.  Neurological:   Patient reports headaches. Patient denies dizziness.  Psychologic:   Patient reports depression and anxiety.    VITAL SIGNS:      09/20/2016 04:06 PM  Weight 190 lb / 86.18 kg  Height 64 in / 162.56 cm  BP 129/92 mmHg  Pulse 75 /min  Temperature 98.2 F / 36.7 C  BMI 32.6 kg/m   MULTI-SYSTEM PHYSICAL EXAMINATION:    Constitutional: Obese. No physical deformities. Normally developed. Good grooming.   Respiratory: No labored breathing, no use of accessory muscles. CTA  Cardiovascular: Normal temperature,  RRR without murmur     PAST DATA REVIEWED:  Source Of History:  Patient  Records Review:   Previous Hospital Records  Urine Test Review:   Urinalysis, Urine Culture  X-Ray Review: [facility] Protocol: Reviewed Films. Reviewed Report. Discussed With Patient.    Notes:                     Recent admission notes reviewed.    PROCEDURES:          Urinalysis w/Scope Dipstick  Dipstick Cont'd Micro  Color: Orange Bilirubin: Invalid WBC/hpf: 6 - 10/hpf  Appearance: Cloudy Ketones: Invalid RBC/hpf: 10 - 20/hpf  Specific Gravity: Invalid Blood: Invalid Bacteria: Few (10-25/hpf)  pH: Invalid Protein: Invalid Cystals: NS (Not Seen)  Glucose: Invalid Urobilinogen: Invalid Casts: NS (Not Seen)    Nitrites: Invalid Trichomonas: Not Present    Leukocyte Esterase: Invalid Mucous: Not Present      Epithelial Cells: 0 - 5/hpf      Yeast: NS (Not Seen)      Sperm: Not Present    Notes: Invalid results due to color interference    ASSESSMENT:      ICD-10 Details  1 GU:   Ureteral calculus - N20.1 Right, Right proximal stone with history of infection now s/p stent placement. I will get her set up for right ureteroscopy for removal of the ureteral and right renal stones. I reviewed the risks of bleeding, infection, ureteral injury, need for a stent or secondary procedures, thrombotic events and anesthetic complications. Meds refilled.   2   Renal calculus - N20.0   3   Urinary Tract Inf, Unspec site - N39.0 Improving - Cont Cipro.    PLAN:            Medications Refill Meds: Phenazopyridine Hcl 200 mg tablet 1 tablet PO TID PRN   #15  1 Refill(s)  Ondansetron Hcl 4 mg tablet 1 tablet PO Q 6 H   #20  0 Refill(s)  Oxycodone Hcl 5 mg tablet 1 tablet PO Q 4 H   #20  0 Refill(s)            Schedule Return Visit/Planned Activity: ASAP - Schedule Surgery          Document Letter(s):  Created for Patient: Clinical Summary

## 2016-09-28 ENCOUNTER — Encounter (HOSPITAL_COMMUNITY): Payer: Self-pay | Admitting: *Deleted

## 2016-09-28 ENCOUNTER — Ambulatory Visit (HOSPITAL_COMMUNITY)
Admission: RE | Admit: 2016-09-28 | Discharge: 2016-09-28 | Disposition: A | Discharge status: Home / Self Care | Payer: Medicaid Other | Source: Ambulatory Visit | Attending: Urology | Admitting: Urology

## 2016-09-28 ENCOUNTER — Ambulatory Visit (HOSPITAL_COMMUNITY): Payer: Medicaid Other | Admitting: Certified Registered Nurse Anesthetist

## 2016-09-28 ENCOUNTER — Encounter (HOSPITAL_COMMUNITY)
Admission: RE | Disposition: A | Discharge status: Home / Self Care | Payer: Self-pay | Source: Ambulatory Visit | Attending: Urology

## 2016-09-28 ENCOUNTER — Ambulatory Visit (HOSPITAL_COMMUNITY): Payer: Medicaid Other

## 2016-09-28 DIAGNOSIS — F329 Major depressive disorder, single episode, unspecified: Secondary | ICD-10-CM | POA: Insufficient documentation

## 2016-09-28 DIAGNOSIS — K219 Gastro-esophageal reflux disease without esophagitis: Secondary | ICD-10-CM | POA: Insufficient documentation

## 2016-09-28 DIAGNOSIS — N200 Calculus of kidney: Secondary | ICD-10-CM | POA: Diagnosis not present

## 2016-09-28 DIAGNOSIS — E7800 Pure hypercholesterolemia, unspecified: Secondary | ICD-10-CM | POA: Diagnosis not present

## 2016-09-28 DIAGNOSIS — G4730 Sleep apnea, unspecified: Secondary | ICD-10-CM | POA: Diagnosis not present

## 2016-09-28 DIAGNOSIS — I4891 Unspecified atrial fibrillation: Secondary | ICD-10-CM | POA: Diagnosis not present

## 2016-09-28 DIAGNOSIS — I10 Essential (primary) hypertension: Secondary | ICD-10-CM | POA: Insufficient documentation

## 2016-09-28 DIAGNOSIS — M1990 Unspecified osteoarthritis, unspecified site: Secondary | ICD-10-CM | POA: Insufficient documentation

## 2016-09-28 DIAGNOSIS — Z79899 Other long term (current) drug therapy: Secondary | ICD-10-CM | POA: Insufficient documentation

## 2016-09-28 DIAGNOSIS — E119 Type 2 diabetes mellitus without complications: Secondary | ICD-10-CM | POA: Insufficient documentation

## 2016-09-28 HISTORY — PX: CYSTOSCOPY/URETEROSCOPY/HOLMIUM LASER/STENT PLACEMENT: SHX6546

## 2016-09-28 SURGERY — CYSTOSCOPY/URETEROSCOPY/HOLMIUM LASER/STENT PLACEMENT
Anesthesia: General | Site: Urethra | Laterality: Right

## 2016-09-28 MED ORDER — DEXAMETHASONE SODIUM PHOSPHATE 10 MG/ML IJ SOLN
INTRAMUSCULAR | Status: AC
  Filled 2016-09-28: qty 1

## 2016-09-28 MED ORDER — LACTATED RINGERS IV SOLN: INTRAVENOUS | Status: DC

## 2016-09-28 MED ORDER — FENTANYL CITRATE (PF) 100 MCG/2ML IJ SOLN
25.0000 ug | INTRAMUSCULAR | Status: DC | PRN
  Administered 2016-09-28 (×2): 50 ug via INTRAVENOUS

## 2016-09-28 MED ORDER — LIDOCAINE HCL (CARDIAC) 20 MG/ML IV SOLN
INTRAVENOUS | Status: DC | PRN
  Administered 2016-09-28: 60 mg via INTRAVENOUS

## 2016-09-28 MED ORDER — MEPERIDINE HCL 50 MG/ML IJ SOLN: 6.2500 mg | INTRAMUSCULAR | Status: DC | PRN

## 2016-09-28 MED ORDER — CIPROFLOXACIN IN D5W 400 MG/200ML IV SOLN
400.0000 mg | INTRAVENOUS | Status: AC
  Administered 2016-09-28: 400 mg via INTRAVENOUS
  Filled 2016-09-28: qty 200

## 2016-09-28 MED ORDER — DEXAMETHASONE SODIUM PHOSPHATE 10 MG/ML IJ SOLN
INTRAMUSCULAR | Status: DC | PRN
  Administered 2016-09-28: 10 mg via INTRAVENOUS

## 2016-09-28 MED ORDER — OXYCODONE HCL 5 MG PO TABS
5.0000 mg | ORAL_TABLET | ORAL | Status: DC | PRN
  Administered 2016-09-28: 5 mg via ORAL
  Filled 2016-09-28: qty 1

## 2016-09-28 MED ORDER — FENTANYL CITRATE (PF) 100 MCG/2ML IJ SOLN
INTRAMUSCULAR | Status: AC
Start: 2016-09-28 — End: 2016-09-28
  Administered 2016-09-28: 50 ug via INTRAVENOUS
  Filled 2016-09-28: qty 2

## 2016-09-28 MED ORDER — PROPOFOL 10 MG/ML IV BOLUS
INTRAVENOUS | Status: AC
  Filled 2016-09-28: qty 20

## 2016-09-28 MED ORDER — MIDAZOLAM HCL 5 MG/5ML IJ SOLN
INTRAMUSCULAR | Status: DC | PRN
  Administered 2016-09-28: 2 mg via INTRAVENOUS

## 2016-09-28 MED ORDER — ONDANSETRON HCL 4 MG/2ML IJ SOLN
INTRAMUSCULAR | Status: AC
  Filled 2016-09-28: qty 2

## 2016-09-28 MED ORDER — MIDAZOLAM HCL 2 MG/2ML IJ SOLN
INTRAMUSCULAR | Status: AC
  Filled 2016-09-28: qty 2

## 2016-09-28 MED ORDER — FENTANYL CITRATE (PF) 100 MCG/2ML IJ SOLN
INTRAMUSCULAR | Status: AC
  Filled 2016-09-28: qty 2

## 2016-09-28 MED ORDER — ACETAMINOPHEN 325 MG PO TABS: 650.0000 mg | ORAL_TABLET | ORAL | Status: DC | PRN

## 2016-09-28 MED ORDER — LACTATED RINGERS IV SOLN
INTRAVENOUS | Status: DC
  Administered 2016-09-28 (×2): via INTRAVENOUS

## 2016-09-28 MED ORDER — SUCCINYLCHOLINE CHLORIDE 20 MG/ML IJ SOLN
INTRAMUSCULAR | Status: DC | PRN
  Administered 2016-09-28: 140 mg via INTRAVENOUS

## 2016-09-28 MED ORDER — OXYCODONE HCL 5 MG PO TABS: 5.0000 mg | ORAL_TABLET | ORAL | 0 refills | Status: DC | PRN

## 2016-09-28 MED ORDER — PROPOFOL 10 MG/ML IV BOLUS
INTRAVENOUS | Status: DC | PRN
  Administered 2016-09-28: 150 mg via INTRAVENOUS

## 2016-09-28 MED ORDER — FENTANYL CITRATE (PF) 100 MCG/2ML IJ SOLN
50.0000 ug | INTRAMUSCULAR | Status: AC | PRN
  Administered 2016-09-28 (×4): 50 ug via INTRAVENOUS

## 2016-09-28 MED ORDER — ONDANSETRON HCL 4 MG/2ML IJ SOLN
INTRAMUSCULAR | Status: DC | PRN
  Administered 2016-09-28: 4 mg via INTRAVENOUS

## 2016-09-28 MED ORDER — ACETAMINOPHEN 650 MG RE SUPP
650.0000 mg | RECTAL | Status: DC | PRN
  Filled 2016-09-28: qty 1

## 2016-09-28 MED ORDER — FENTANYL CITRATE (PF) 100 MCG/2ML IJ SOLN
INTRAMUSCULAR | Status: AC
  Administered 2016-09-28: 50 ug via INTRAVENOUS
  Filled 2016-09-28: qty 4

## 2016-09-28 MED ORDER — MORPHINE SULFATE (PF) 4 MG/ML IV SOLN
2.0000 mg | INTRAVENOUS | Status: DC | PRN
Start: 2016-09-28 — End: 2016-09-28

## 2016-09-28 MED ORDER — LIDOCAINE 2% (20 MG/ML) 5 ML SYRINGE
INTRAMUSCULAR | Status: AC
Start: 2016-09-28 — End: ?
  Filled 2016-09-28: qty 5

## 2016-09-28 SURGICAL SUPPLY — 23 items
BAG URO CATCHER STRL LF (MISCELLANEOUS) ×3 IMPLANT
BASKET STONE NCOMPASS (UROLOGICAL SUPPLIES) ×2 IMPLANT
CATH URET 5FR 28IN OPEN ENDED (CATHETERS) IMPLANT
CATH URET DUAL LUMEN 6-10FR 50 (CATHETERS) ×3 IMPLANT
CLOTH BEACON ORANGE TIMEOUT ST (SAFETY) ×3 IMPLANT
COVER SURGICAL LIGHT HANDLE (MISCELLANEOUS) ×3 IMPLANT
EXTRACTOR STONE NITINOL NGAGE (UROLOGICAL SUPPLIES) ×3 IMPLANT
FIBER LASER FLEXIVA 1000 (UROLOGICAL SUPPLIES) IMPLANT
FIBER LASER FLEXIVA 365 (UROLOGICAL SUPPLIES) IMPLANT
FIBER LASER FLEXIVA 550 (UROLOGICAL SUPPLIES) IMPLANT
FIBER LASER TRAC TIP (UROLOGICAL SUPPLIES) ×2 IMPLANT
GLOVE SURG SS PI 8.0 STRL IVOR (GLOVE) IMPLANT
GOWN STRL REUS W/TWL XL LVL3 (GOWN DISPOSABLE) ×3 IMPLANT
GUIDEWIRE STR DUAL SENSOR (WIRE) ×3 IMPLANT
IV NS 1000ML (IV SOLUTION) ×3
IV NS 1000ML BAXH (IV SOLUTION) ×1 IMPLANT
IV NS IRRIG 3000ML ARTHROMATIC (IV SOLUTION) ×3 IMPLANT
MANIFOLD NEPTUNE II (INSTRUMENTS) ×3 IMPLANT
PACK CYSTO (CUSTOM PROCEDURE TRAY) ×3 IMPLANT
SHEATH ACCESS URETERAL 38CM (SHEATH) ×5 IMPLANT
STENT URET 6FRX26 CONTOUR (STENTS) ×2 IMPLANT
TUBING CONNECTING 10 (TUBING) ×2 IMPLANT
TUBING CONNECTING 10' (TUBING) ×1

## 2016-09-28 NOTE — Anesthesia Preprocedure Evaluation (Signed)
Anesthesia Evaluation  Patient identified by MRN, date of birth, ID band Patient awake    Reviewed: Allergy & Precautions, NPO status , Patient's Chart, lab work & pertinent test results  Airway Mallampati: II  TM Distance: >3 FB Neck ROM: Full    Dental no notable dental hx.    Pulmonary sleep apnea ,    Pulmonary exam normal breath sounds clear to auscultation       Cardiovascular hypertension, Pt. on medications Normal cardiovascular exam+ dysrhythmias Atrial Fibrillation  Rhythm:Regular Rate:Normal     Neuro/Psych negative neurological ROS  negative psych ROS   GI/Hepatic Neg liver ROS, GERD (gastroparesis)  ,  Endo/Other  diabetes  Renal/GU negative Renal ROS  negative genitourinary   Musculoskeletal negative musculoskeletal ROS (+)   Abdominal   Peds negative pediatric ROS (+)  Hematology negative hematology ROS (+)   Anesthesia Other Findings   Reproductive/Obstetrics negative OB ROS                            Anesthesia Physical Anesthesia Plan  ASA: II  Anesthesia Plan: General   Post-op Pain Management:    Induction: Intravenous  PONV Risk Score and Plan: 3 and Ondansetron, Dexamethasone and Midazolam  Airway Management Planned: LMA and Oral ETT  Additional Equipment:   Intra-op Plan:   Post-operative Plan: Extubation in OR  Informed Consent: I have reviewed the patients History and Physical, chart, labs and discussed the procedure including the risks, benefits and alternatives for the proposed anesthesia with the patient or authorized representative who has indicated his/her understanding and acceptance.   Dental advisory given  Plan Discussed with: CRNA  Anesthesia Plan Comments:         Anesthesia Quick Evaluation

## 2016-09-28 NOTE — Progress Notes (Signed)
Patient states that she feels as if she is unable to empty her bladder after urinating. Bladder scanned for 56 ml. She states that she has much greater PO intake than urinary output. Given hat for measuring urine at home. She is instructed to record all fluid intake and urine output until she follows up with [facility] next week. Instructed to measure in ounces as she is more comfortable with this unit of measure.

## 2016-09-28 NOTE — Discharge Instructions (Addendum)
Ureteral Stent Implantation, Care After Refer to this sheet in the next few weeks. These instructions provide you with information about caring for yourself after your procedure. Your health care provider may also give you more specific instructions. Your treatment has been planned according to current medical practices, but problems sometimes occur. Call your health care provider if you have any problems or questions after your procedure. What can I expect after the procedure? After the procedure, it is common to have:  Nausea.  Mild pain when you urinate. You may feel this pain in your lower back or lower abdomen. Pain should stop within a few minutes after you urinate. This may last for up to 1 week.  A small amount of blood in your urine for several days.  Follow these instructions at home:  Medicines  Take over-the-counter and prescription medicines only as told by your health care provider.  If you were prescribed an antibiotic medicine, take it as told by your health care provider. Do not stop taking the antibiotic even if you start to feel better.  Do not drive for 24 hours if you received a sedative.  Do not drive or operate heavy machinery while taking prescription pain medicines. Activity  Return to your normal activities as told by your health care provider. Ask your health care provider what activities are safe for you.  Do not lift anything that is heavier than 10 lb (4.5 kg). Follow this limit for 1 week after your procedure, or for as long as told by your health care provider. General instructions  Watch for any blood in your urine. Call your health care provider if the amount of blood in your urine increases.  If you have a catheter: ? Follow instructions from your health care provider about taking care of your catheter and collection bag. ? Do not take baths, swim, or use a hot tub until your health care provider approves.  Drink enough fluid to keep your urine  clear or pale yellow.  Keep all follow-up visits as told by your health care provider. This is important. Contact a health care provider if:  You have pain that gets worse or does not get better with medicine, especially pain when you urinate.  You have difficulty urinating.  You feel nauseous or you vomit repeatedly during a period of more than 2 days after the procedure. Get help right away if:  Your urine is dark red or has blood clots in it.  You are leaking urine (have incontinence).  The end of the stent comes out of your urethra.  You cannot urinate.  You have sudden, sharp, or severe pain in your abdomen or lower back.  You have a fever.  You may remove the stent on Friday morning using the attached string that is tucked vaginally.  If you don't feel comfortable doing that you can come to the office to have it done.  Please call before you come.    This information is not intended to replace advice given to you by your health care provider. Make sure you discuss any questions you have with your health care provider. Document Released: 10/04/2012 Document Revised: 07/10/2015 Document Reviewed: 08/16/2014 Elsevier Interactive Patient Education  2018 [employer].   General Anesthesia, Adult, Care After These instructions provide you with information about caring for yourself after your procedure. Your health care provider may also give you more specific instructions. Your treatment has been planned according to current medical practices, but problems sometimes  occur. Call your health care provider if you have any problems or questions after your procedure. What can I expect after the procedure? After the procedure, it is common to have:  Vomiting.  A sore throat.  Mental slowness.  It is common to feel:  Nauseous.  Cold or shivery.  Sleepy.  Tired.  Sore or achy, even in parts of your body where you did not have surgery.  Follow these instructions at  home: For at least 24 hours after the procedure:  Do not: ? Participate in activities where you could fall or become injured. ? Drive. ? Use heavy machinery. ? Drink alcohol. ? Take sleeping pills or medicines that cause drowsiness. ? Make important decisions or sign legal documents. ? Take care of children on your own.  Rest. Eating and drinking  If you vomit, drink water, juice, or soup when you can drink without vomiting.  Drink enough fluid to keep your urine clear or pale yellow.  Make sure you have little or no nausea before eating solid foods.  Follow the diet recommended by your health care provider. General instructions  Have a responsible adult stay with you until you are awake and alert.  Return to your normal activities as told by your health care provider. Ask your health care provider what activities are safe for you.  Take over-the-counter and prescription medicines only as told by your health care provider.  If you smoke, do not smoke without supervision.  Keep all follow-up visits as told by your health care provider. This is important. Contact a health care provider if:  You continue to have nausea or vomiting at home, and medicines are not helpful.  You cannot drink fluids or start eating again.  You cannot urinate after 8-12 hours.  You develop a skin rash.  You have fever.  You have increasing redness at the site of your procedure. Get help right away if:  You have difficulty breathing.  You have chest pain.  You have unexpected bleeding.  You feel that you are having a life-threatening or urgent problem. This information is not intended to replace advice given to you by your health care provider. Make sure you discuss any questions you have with your health care provider. Document Released: 05/10/2000 Document Revised: 07/07/2015 Document Reviewed: 01/16/2015 Elsevier Interactive Patient Education  [employer]2018 [employer]

## 2016-09-28 NOTE — Interval H&P Note (Signed)
History and Physical Interval Note:  09/28/2016 1:10 PM  Sandra [name]  has presented today for surgery, with the diagnosis of RIGHT PROXIMAL STONE  The various methods of treatment have been discussed with the patient and family. After consideration of risks, benefits and other options for treatment, the patient has consented to  Procedure(s): CYSTOSCOPY RIGHT URETEROSCOPY WITH HOLMIUM LASER AND RIGHT STENT EXCHANGE (Right) as a surgical intervention .  The patient's history has been reviewed, patient examined, no change in status, stable for surgery.  I have reviewed the patient's chart and labs.  Questions were answered to the patient's satisfaction.     [name]

## 2016-09-28 NOTE — Transfer of Care (Signed)
Immediate Anesthesia Transfer of Care Note  Patient: Sandra [name]  Procedure(s) Performed: Procedure(s): CYSTOSCOPY RIGHT URETEROSCOPY WITH HOLMIUM LASER AND RIGHT STENT EXCHANGE (Right)  Patient Location: PACU  Anesthesia Type:General  Level of Consciousness: awake, alert  and oriented  Airway & Oxygen Therapy: Patient Spontanous Breathing and Patient connected to nasal cannula oxygen  Post-op Assessment: Report given to RN and Post -op Vital signs reviewed and stable  Post vital signs: Reviewed and stable  Last Vitals:  Vitals:   09/28/16 1002  BP: 122/85  Pulse: 73  Resp: 16  Temp: 36.7 C  SpO2: 95%    Last Pain:  Vitals:   09/28/16 1345  TempSrc:   PainSc: 8       Patients Stated Pain Goal: 3 (09/28/16 1345)  Complications: No apparent anesthesia complications

## 2016-09-28 NOTE — Anesthesia Procedure Notes (Signed)
Procedure Name: Intubation Date/Time: 09/28/2016 1:51 PM Performed by: Gaston Islam EVETTE Pre-anesthesia Checklist: Patient identified and Suction available Patient Re-evaluated:Patient Re-evaluated prior to induction Oxygen Delivery Method: Circle system utilized Preoxygenation: Pre-oxygenation with 100% oxygen Induction Type: IV induction Ventilation: Mask ventilation without difficulty Laryngoscope Size: Mac and 4 Grade View: Grade I Tube type: Oral Tube size: 7.5 mm Number of attempts: 1 Airway Equipment and Method: Patient positioned with wedge pillow Placement Confirmation: ETT inserted through vocal cords under direct vision,  positive ETCO2,  CO2 detector and breath sounds checked- equal and bilateral Secured at: 22 cm Tube secured with: Tape Dental Injury: Teeth and Oropharynx as per pre-operative assessment

## 2016-09-28 NOTE — Anesthesia Postprocedure Evaluation (Signed)
Anesthesia Post Note  Patient: Sandra [name]  Procedure(s) Performed: Procedure(s) (LRB): CYSTOSCOPY RIGHT URETEROSCOPY WITH HOLMIUM LASER AND RIGHT STENT EXCHANGE (Right)     Patient location during evaluation: PACU Anesthesia Type: General Level of consciousness: awake and alert Pain management: pain level controlled Vital Signs Assessment: post-procedure vital signs reviewed and stable Respiratory status: spontaneous breathing, nonlabored ventilation, respiratory function stable and patient connected to nasal cannula oxygen Cardiovascular status: blood pressure returned to baseline and stable Postop Assessment: no signs of nausea or vomiting Anesthetic complications: no    Last Vitals:  Vitals:   09/28/16 1530 09/28/16 1551  BP: (!) 146/95 (!) 148/90  Pulse: 71 67  Resp: 13 14  Temp: 36.5 C 36.6 C  SpO2: 96% 94%    Last Pain:  Vitals:   09/28/16 1553  TempSrc:   PainSc: 9                  [name]

## 2016-09-30 NOTE — Op Note (Signed)
NAME:  Sandra [name], Sandra [name]                    ACCOUNT NO.:  MEDICAL RECORD NO.:  [id number]  LOCATION:                                 FACILITY:  PHYSICIAN:  Excell SeltzerJohn J. Annabell HowellsWrenn, M.D.    DATE OF BIRTH:  [birth year]  DATE OF PROCEDURE:  09/28/2016 DATE OF DISCHARGE:                              OPERATIVE REPORT   PROCEDURES:  Cystoscopy with removal of right double-J stent, right ureteroscopy with holmium lasertripsy and insertion of left double-J stent.  PREOPERATIVE DIAGNOSIS:  Left proximal stone with infection.  POSTOPERATIVE DIAGNOSIS:  Left proximal stone with infection.  SURGEON:  Excell SeltzerJohn J. Annabell HowellsWrenn, M.D.  ANESTHESIA:  General.  SPECIMEN:  Stone fragments.  DRAINS:  A 6-French x 26-cm right double-J stent.  BLOOD LOSS:  None.  COMPLICATIONS:  None.  INDICATIONS:  Ms. Sandra [name] is a 46 year old white female, who was originally seen on September 15, 2016, with a 7-mm right proximal stone with infection, she underwent stenting.  She returns today for definitive therapy for stone.  FINDINGS AND PROCEDURE:  She was taken to the operating room where general anesthetic was induced.  She was placed in lithotomy position and was fitted with PAS hose.  She was given antibiotics.  Her perineum and genitalia were prepped with Betadine solution and she was draped in usual sterile fashion.  Cystoscopy was performed using a 23-French scope and 30-degree lens. The distal stent loop was grasped with a grasping forceps and pulled to the urethral meatus.  A guidewire was then passed to the kidney under fluoroscopic guidance and the stent was removed.  An assembled 38-cm 12/14 digital access sheath was then inserted over the wire and was passed easily to the proximal ureter.  The inner core and wire were removed.  A dual-lumen digital flexible ureteroscope was then passed to the kidney.  The stone was identified in an upper pole calyx where it had been dislodged by the wire.  The stone was engaged  with 200-micron laser fiber, 1 watt and 10 hertz.  The stone was broken into manageable fragments, which were then grasped with an N-Gage basket and removed and fragments.  Once all the significant fragments from the large stone were removed, an additional stone in a midpole calyx that was adherent to the papillary tip was removed, it was approximately 4 mm.  At this point, final inspection revealed no significant residual stones and fluoroscopy revealed no significant residual stones.  A guidewire was then reinserted through the access sheath.  The access sheath was removed and the 6-French 26-cm Contour double-J stent with tether was passed to the kidney under fluoroscopic guidance.  The wire was removed leaving good coil in the kidney and a good coil in the bladder.  The bladder was drained.  The stent string was left exiting the urethra, it was tied close to the meatus, trimmed to an appropriate length and tucked vaginally.  The patient was taken down from lithotomy position.  Her anesthetic was reversed.  She was moved to the recovery room in stable condition.  There were no complications.     Excell SeltzerJohn J. Annabell HowellsWrenn, M.D.     JJW/MEDQ  D:  09/29/2016  T:  09/30/2016  Job:  [id number]

## 2016-12-06 DIAGNOSIS — Y939 Activity, unspecified: Secondary | ICD-10-CM | POA: Diagnosis not present

## 2016-12-06 DIAGNOSIS — Z79899 Other long term (current) drug therapy: Secondary | ICD-10-CM | POA: Diagnosis not present

## 2016-12-06 DIAGNOSIS — I10 Essential (primary) hypertension: Secondary | ICD-10-CM | POA: Insufficient documentation

## 2016-12-06 DIAGNOSIS — Y929 Unspecified place or not applicable: Secondary | ICD-10-CM | POA: Diagnosis not present

## 2016-12-06 DIAGNOSIS — S022XXA Fracture of nasal bones, initial encounter for closed fracture: Secondary | ICD-10-CM | POA: Diagnosis not present

## 2016-12-06 DIAGNOSIS — S0993XA Unspecified injury of face, initial encounter: Secondary | ICD-10-CM | POA: Diagnosis present

## 2016-12-06 DIAGNOSIS — Y999 Unspecified external cause status: Secondary | ICD-10-CM | POA: Diagnosis not present

## 2016-12-07 ENCOUNTER — Emergency Department (HOSPITAL_COMMUNITY): Payer: Medicaid Other

## 2016-12-07 ENCOUNTER — Ambulatory Visit: Payer: Self-pay

## 2016-12-07 ENCOUNTER — Emergency Department (HOSPITAL_COMMUNITY)
Admission: EM | Admit: 2016-12-07 | Discharge: 2016-12-07 | Disposition: A | Discharge status: Home / Self Care | Payer: Medicaid Other | Attending: Emergency Medicine | Admitting: Emergency Medicine

## 2016-12-07 ENCOUNTER — Encounter (HOSPITAL_COMMUNITY): Payer: Self-pay | Admitting: Emergency Medicine

## 2016-12-07 DIAGNOSIS — S022XXA Fracture of nasal bones, initial encounter for closed fracture: Secondary | ICD-10-CM

## 2016-12-07 DIAGNOSIS — S0083XA Contusion of other part of head, initial encounter: Secondary | ICD-10-CM

## 2016-12-07 MED ORDER — TRAMADOL HCL 50 MG PO TABS: 50.0000 mg | ORAL_TABLET | Freq: Four times a day (QID) | ORAL | 0 refills | Status: DC | PRN

## 2016-12-07 MED ORDER — OXYCODONE-ACETAMINOPHEN 5-325 MG PO TABS
1.0000 | ORAL_TABLET | Freq: Once | ORAL | Status: AC
  Administered 2016-12-07: 1 via ORAL
  Filled 2016-12-07: qty 1

## 2016-12-07 NOTE — ED Notes (Signed)
Patient is alert and oriented x3.  She was given DC instructions and follow up visit instructions.  Patient gave verbal understanding. She was DC ambulatory under her own power to home.  V/S stable.  He was not showing any signs of distress on DC 
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
## 2016-12-14 ENCOUNTER — Ambulatory Visit: Payer: Self-pay

## 2017-04-15 ENCOUNTER — Encounter (HOSPITAL_COMMUNITY): Payer: Self-pay

## 2017-04-15 DIAGNOSIS — Z79899 Other long term (current) drug therapy: Secondary | ICD-10-CM | POA: Insufficient documentation

## 2017-04-15 DIAGNOSIS — Y929 Unspecified place or not applicable: Secondary | ICD-10-CM | POA: Insufficient documentation

## 2017-04-15 DIAGNOSIS — E7800 Pure hypercholesterolemia, unspecified: Secondary | ICD-10-CM | POA: Insufficient documentation

## 2017-04-15 DIAGNOSIS — I10 Essential (primary) hypertension: Secondary | ICD-10-CM | POA: Insufficient documentation

## 2017-04-15 DIAGNOSIS — S0993XA Unspecified injury of face, initial encounter: Secondary | ICD-10-CM | POA: Insufficient documentation

## 2017-04-15 DIAGNOSIS — Y998 Other external cause status: Secondary | ICD-10-CM | POA: Insufficient documentation

## 2017-04-15 DIAGNOSIS — Y939 Activity, unspecified: Secondary | ICD-10-CM | POA: Insufficient documentation
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
## 2017-04-16 ENCOUNTER — Emergency Department (HOSPITAL_COMMUNITY): Payer: Medicaid Other

## 2017-04-16 ENCOUNTER — Emergency Department (HOSPITAL_COMMUNITY)
Admission: EM | Admit: 2017-04-16 | Discharge: 2017-04-16 | Disposition: A | Discharge status: Home / Self Care | Payer: Medicaid Other | Attending: Emergency Medicine | Admitting: Emergency Medicine

## 2017-04-16 MED ORDER — HYDROCODONE-ACETAMINOPHEN 5-325 MG PO TABS
2.0000 | ORAL_TABLET | Freq: Once | ORAL | Status: AC
  Administered 2017-04-16: 2 via ORAL
  Filled 2017-04-16: qty 2

## 2017-04-16 MED ORDER — IBUPROFEN 800 MG PO TABS: 800.0000 mg | ORAL_TABLET | Freq: Three times a day (TID) | ORAL | 0 refills | Status: DC

## 2017-04-16 NOTE — Discharge Instructions (Signed)
Apply ice, heat, and use ibuprofen for pain and swelling.  Please follow-up with your doctor as needed.
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
## 2017-06-14 ENCOUNTER — Emergency Department (HOSPITAL_COMMUNITY)
Admission: EM | Admit: 2017-06-14 | Discharge: 2017-06-14 | Disposition: A | Discharge status: Home / Self Care | Payer: Self-pay | Attending: Emergency Medicine | Admitting: Emergency Medicine

## 2017-06-14 ENCOUNTER — Encounter (HOSPITAL_COMMUNITY): Payer: Self-pay | Admitting: Emergency Medicine

## 2017-06-14 ENCOUNTER — Emergency Department (HOSPITAL_COMMUNITY): Payer: Self-pay

## 2017-06-14 DIAGNOSIS — R0602 Shortness of breath: Secondary | ICD-10-CM

## 2017-06-14 DIAGNOSIS — I10 Essential (primary) hypertension: Secondary | ICD-10-CM | POA: Insufficient documentation

## 2017-06-14 DIAGNOSIS — Z79899 Other long term (current) drug therapy: Secondary | ICD-10-CM | POA: Insufficient documentation

## 2017-06-14 DIAGNOSIS — D649 Anemia, unspecified: Secondary | ICD-10-CM | POA: Insufficient documentation

## 2017-06-14 DIAGNOSIS — R739 Hyperglycemia, unspecified: Secondary | ICD-10-CM | POA: Insufficient documentation

## 2017-06-14 HISTORY — DX: Migraine, unspecified, not intractable, without status migrainosus: G43.909

## 2017-06-14 LAB — TROPONIN I
Troponin I: <0.03 ng/mL (ref <0.03)
Troponin I: <0.03 ng/mL (ref <0.03)

## 2017-06-14 LAB — BASIC METABOLIC PANEL
Anion gap: 9 (ref 5–15)
BUN: 15 mg/dL (ref 6–20)
CALCIUM: 9 mg/dL (ref 8.9–10.3)
CO2: 23 mmol/L (ref 22–32)
Chloride: 104 mmol/L (ref 101–111)
Creatinine, Ser: 1.07 mg/dL — ABNORMAL HIGH (ref 0.44–1.00)
GFR calc Af Amer: >60 mL/min (ref >60)
GLUCOSE: 144 mg/dL — AB (ref 65–99)
POTASSIUM: 4.1 mmol/L (ref 3.5–5.1)
Sodium: 136 mmol/L (ref 135–145)

## 2017-06-14 LAB — POC URINE PREG, ED: Preg Test, Ur: NEGATIVE

## 2017-06-14 LAB — CBC
HEMATOCRIT: 34.4 % — AB (ref 36.0–46.0)
Hemoglobin: 10.8 g/dL — ABNORMAL LOW (ref 12.0–15.0)
MCH: 28.6 pg (ref 26.0–34.0)
MCHC: 31.4 g/dL (ref 30.0–36.0)
MCV: 91.2 fL (ref 78.0–100.0)
Platelets: 270 10*3/uL (ref 150–400)
RBC: 3.77 MIL/uL — ABNORMAL LOW (ref 3.87–5.11)
RDW: 14.6 % (ref 11.5–15.5)
WBC: 6.1 10*3/uL (ref 4.0–10.5)

## 2017-06-14 LAB — D-DIMER, QUANTITATIVE: D-Dimer, Quant: 0.33 ug/mL-FEU (ref 0.00–0.50)

## 2017-06-14 MED ORDER — CLONAZEPAM 0.5 MG PO TABS: ORAL_TABLET | ORAL | 0 refills | Status: DC

## 2017-06-14 MED ORDER — LISINOPRIL 40 MG PO TABS: 40.0000 mg | ORAL_TABLET | Freq: Every day | ORAL | 0 refills | Status: DC
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
## 2017-06-14 NOTE — ED Triage Notes (Signed)
Pt reports SOB, dizziness and cough since yesterday. Cough is non productive.

## 2017-06-14 NOTE — Discharge Instructions (Addendum)
Please see the information and instructions below regarding your visit.  Your diagnoses today include:  1. Shortness of breath   2. Normocytic anemia   3. Hyperglycemia     Tests performed today include: See side panel of your discharge paperwork for testing performed today. Vital signs are listed at the bottom of these instructions.   Medications prescribed:    Take any prescribed medications only as prescribed, and any over the counter medications only as directed on the packaging.  Please perform the taper of Klonopin scheduled in the prescription.  You will only take 3 more days of this.  Do not drive, drink, or operate machinery while taking this medication.  I prescribed 1 month of your lisinopril.  Please have this rechecked as soon as possible.  Home care instructions:  Please follow any educational materials contained in this packet.   Follow-up instructions: Please follow-up with your primary care provider as soon as possible for further evaluation of your symptoms if they are not completely improved.   Please follow up with the primary care clinics were listed in your paperwork.  Return instructions:  Please return to the Emergency Department if you experience worsening symptoms.  Please return to the emergency department if you have sudden shortness of breath, chest pain, feeling that her heart is beating out of the chest, feeling like you are going to pass out. Please return if you have any other emergent concerns.  Additional Information:   Your vital signs today were: BP (!) 155/110    Pulse 79    Temp 99.1 F (37.3 C) (Oral)    Resp 19    Ht  (1.626 m)    Wt 94.1 kg (207 lb 8 oz)    LMP 06/10/2017 (Approximate)    SpO2 99%    BMI 35.62 kg/m  If your blood pressure (BP) was elevated on multiple readings during this visit above 130 for the top number or above 80 for the bottom number, please have this repeated by your primary care provider within one  month. --------------  Thank you for allowing [place] to participate in your care today.

## 2017-06-14 NOTE — ED Provider Notes (Signed)
[facility]-EMERGENCY DEPT Provider Note   CSN: [id number] Arrival date & time: 06/14/17  1120     History   Chief Complaint Chief Complaint  Patient presents with  . Shortness of Breath  . Cough  . Dizziness    HPI Sandra [name] is a 47 y.o. female.  HPI   Patient is a 47 year old female with a history of hypertension, PTSD, and anxiety presenting for shortness of breath, nonproductive cough, and chest heaviness.  Patient reports that she woke up yesterday morning with the symptoms.  Patient denies history of the same.  Patient reports that she felt shortness of breath with all activity, both resting and exertion, and that her chest heaviness also did not change with exertion or rest.  Patient reports she has some slight discomfort in the substernal area, nonradiating.  Patient also reports she has been generally lightheaded "like in a fog" when she gets up to move around since yesterday morning.  Patient denies any recent respiratory illness, history of respiratory disease, smoking history, fevers, chills, hemoptysis, or sputum production.  Patient denies any epigastric pain, nausea, vomiting.  Patient denies any recent immobilization, hospitalization, leg swelling, estrogen use, recent surgery, history of DVT/PE, family history of DVT/PE.  Patient denies any family history of early MI.  Patient does report that she had a cardiac work-up for an abnormal heart rhythm, but is unsure what work-up she had and what was the cause.   Of note, patient reports that she lost her insurance a couple months ago, and no longer has coverage for her antihypertensive and 3 times a day Klonopin.  Patient previously was getting both prescriptions from [facility].  Past Medical History:  Diagnosis Date  . Gestational diabetes   . Migraine     There are no active problems to display for this patient.   Past Surgical History:  Procedure Laterality Date  . DILATION AND CURETTAGE,  DIAGNOSTIC / THERAPEUTIC       OB History   None      Home Medications    Prior to Admission medications   Medication Sig Start Date End Date Taking? Authorizing Provider  acetaminophen (TYLENOL) 500 MG tablet Take 1,000 mg by mouth every 6 (six) hours as needed for moderate pain.   Yes [provider]  clonazePAM (KLONOPIN) 0.5 MG tablet Take 0.5 mg by mouth 3 (three) times daily.   Yes [provider]  gabapentin (NEURONTIN) 300 MG capsule Take 300 mg by mouth 3 (three) times daily.   Yes [provider]  lisinopril (PRINIVIL,ZESTRIL) 40 MG tablet Take 40 mg by mouth daily.   Yes [provider]  MELATONIN PO Take 1 tablet by mouth at bedtime as needed (sleep).   Yes [provider]    Family History No family history on file.  Social History Social History   Tobacco Use  . Smoking status: Never Smoker  . Smokeless tobacco: Never Used  Substance Use Topics  . Alcohol use: Not on file  . Drug use: Not on file     Allergies   Iodine   Review of Systems Review of Systems  Constitutional: Negative for chills and fever.  HENT: Negative for congestion, rhinorrhea, sinus pain and sore throat.   Eyes: Negative for visual disturbance.  Respiratory: Positive for cough, chest tightness and shortness of breath.   Cardiovascular: Negative for chest pain, palpitations and leg swelling.  Gastrointestinal: Negative for abdominal pain, diarrhea, nausea and vomiting.  Genitourinary: Negative  for dysuria.  Musculoskeletal: Negative for myalgias.  Skin: Negative for rash.  Neurological: Negative for dizziness, syncope, weakness, light-headedness, numbness and headaches.     Physical Exam Updated Vital Signs BP (!) 142/105   Pulse 85   Temp 99.1 F (37.3 C) (Oral)   Resp 15   Ht  (1.626 m)   Wt 94.1 kg (207 lb 8 oz)   LMP 06/10/2017 (Approximate)   SpO2 100%   BMI 35.62 kg/m   Physical Exam  Constitutional: She  appears well-developed and well-nourished. No distress.  HENT:  Head: Normocephalic and atraumatic.  Mouth/Throat: Oropharynx is clear and moist.  Eyes: Pupils are equal, round, and reactive to light. Conjunctivae and EOM are normal.  Neck: Normal range of motion. Neck supple.  Cardiovascular: Normal rate, regular rhythm, S1 normal and S2 normal.  No murmur heard. No lower extremity edema.  Intact 2+ DP pulses.  Pulmonary/Chest: Effort normal and breath sounds normal. She has no wheezes. She has no rales.  Abdominal: Soft. She exhibits no distension. There is no tenderness. There is no guarding.  Musculoskeletal: Normal range of motion. She exhibits no edema or deformity.  No calf tenderness bilaterally.  Lymphadenopathy:    She has no cervical adenopathy.  Neurological: She is alert.  Cranial nerves grossly intact. Patient moves extremities symmetrically and with good coordination.  Skin: Skin is warm and dry. No rash noted. No erythema.  Psychiatric: She has a normal mood and affect. Her behavior is normal. Judgment and thought content normal.  Nursing note and vitals reviewed.    ED Treatments / Results  Labs (all labs ordered are listed, but only abnormal results are displayed) Labs Reviewed  BASIC METABOLIC PANEL - Abnormal; Notable for the following components:      Result Value   Glucose, Bld 144 (*)    Creatinine, Ser 1.07 (*)    All other components within normal limits  CBC - Abnormal; Notable for the following components:   RBC 3.77 (*)    Hemoglobin 10.8 (*)    HCT 34.4 (*)    All other components within normal limits  TROPONIN I  D-DIMER, QUANTITATIVE (NOT AT [facility])  TROPONIN I  POC URINE PREG, ED    EKG EKG Interpretation  Date/Time:  Tuesday June 14 2017 11:32:43 EDT Ventricular Rate:  99 PR Interval:    QRS Duration: 80 QT Interval:  336 QTC Calculation: 432 R Axis:     Text Interpretation:  Sinus rhythm no acute st/ts no prior to compare with  Confirmed by Meridee Score [phone]) on 06/14/2017 4:15:11 PM   Radiology Dg Chest 2 View  Result Date: 06/14/2017 CLINICAL DATA:  Mid chest pain associated with shortness of breath and coughing since yesterday. History of hypertension, nonsmoker. EXAM: CHEST - 2 VIEW COMPARISON:  None in PACs FINDINGS: The lungs are reasonably well inflated and clear. The heart and pulmonary vascularity are normal. The mediastinum is normal in width. There is no pleural effusion. The bony thorax exhibits no acute abnormality. IMPRESSION: There is no active cardiopulmonary disease. Electronically Signed   By: David  [place] M.D.   On: 06/14/2017 11:55    Procedures Procedures (including critical care time)  Medications Ordered in ED Medications - No data to display   Initial Impression / Assessment and Plan / ED Course  I have reviewed the triage vital signs and the nursing notes.  Pertinent labs & imaging results that were available during my care of the patient were reviewed  by me and considered in my medical decision making (see chart for details).  Clinical Course as of Jun 15 1851  Tue Jun 14, 2017  1804 Patient reassessed.  Patient reports that she had history of similar sensation when she was in her irregular heart rhythm.  Patient has a chart scheduled for emergent and chart review previous data demonstrates that she had a single episode of paroxysmal atrial fibrillation.  Patient has never been anticoagulated for Afib.   [AM]    Clinical Course User Index [AM] Elisha Ponder, PA-C    Patient is nontoxic-appearing, not tachycardic on my examination, and in no acute distress.  Oxygen saturation 99 to 100% on room air at rest.  Differential diagnosis includes ACS, pulmonary embolism, reactive airway disease, viral respiratory illness, pericarditis, arrhythmia, anemia, electrolyte disturbance.  Initial troponin negative.  EKG in normal sinus rhythm without evidence of ischemia, infarction, or  arrhythmia.  Hemoglobin is 10.8, without baseline to compare, but patient does report that she gets regular slightly heavy periods, and she has been told in the past that she has been anemic.  Patient denies melena or hematochezia, but does note that a couple times a month she sees a small amount of blood in her stool.  Does have a history of hemorrhoids.  Patient also has elevated glucose at 144, and has not eaten in several hours.  Discussed with patient.   Will obtain delta troponin and d-dimer.  If negative, suspect that patient is experiencing some symptoms secondary to abruptly stopping her antihypertensive and 3 times daily Klonopin.  Work-up thus far negative with negative D-dimer and negative delta troponin.  Restarted patient on lisinopril and initiated a Klonopin taper for 3 days.  Case management came to speak with patient regarding obtaining primary care with [facility].    Patient given return precautions for any worsening shortness of breath, abdominal chest pain, syncope or presyncope.  Patient is in understanding and agrees with plan of care.  This is a shared visit with Dr. Meridee Score. Patient was independently evaluated by this attending physician. Attending physician consulted in evaluation and discharge management.  Final Clinical Impressions(s) / ED Diagnoses   Final diagnoses:  Shortness of breath  Normocytic anemia  Hyperglycemia    ED Discharge Orders        Ordered    lisinopril (PRINIVIL,ZESTRIL) 40 MG tablet  Daily     06/14/17 1814    clonazePAM (KLONOPIN) 0.5 MG tablet     06/14/17 1814       Delia Chimes 06/14/17 1856    Terrilee Files, MD 06/15/17 [phone]

## 2017-06-14 NOTE — ED Notes (Signed)
Could not establish IV, was however able to draw blood. I have informed ED PA. We will wait to establish IV access if needed based on Lab results.

## 2017-06-14 NOTE — ED Notes (Signed)
Pt. Unable to urinate at this time. Will collect urine when pt. Voids. Nurse aware.  

## 2017-06-14 NOTE — ED Notes (Signed)
ED PA at bedside with patient. 

## 2017-06-15 ENCOUNTER — Encounter (HOSPITAL_COMMUNITY): Payer: Self-pay

## 2017-06-16 ENCOUNTER — Telehealth: Payer: Self-pay

## 2017-06-16 NOTE — Telephone Encounter (Signed)
Message received from Rubie Maid, RN CM requesting a hospital follow up appointment for the patient at [facility]. Informed her that there are not any appointments available right now.  The phone numbers for [facility] was placed on the patient's AVS from the ED and she can call to check availability.

## 2017-09-11 IMAGING — US US ABDOMEN COMPLETE
1 series · 14 of 25 positions shown · non-contrast
Comparison: CT abdomen pelvis 01/14/2014.

CLINICAL DATA: Patient with nausea, vomiting and abdominal pain.

EXAM:
ABDOMEN ULTRASOUND COMPLETE

[Series 1: us abdomen complete · 0.23mm/px · 14 of 81 slices shown]
[im 1/81]
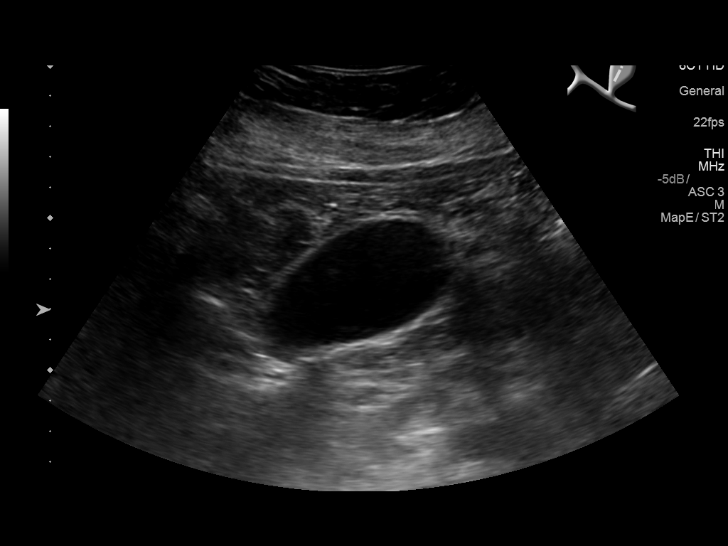
[im 7/81]
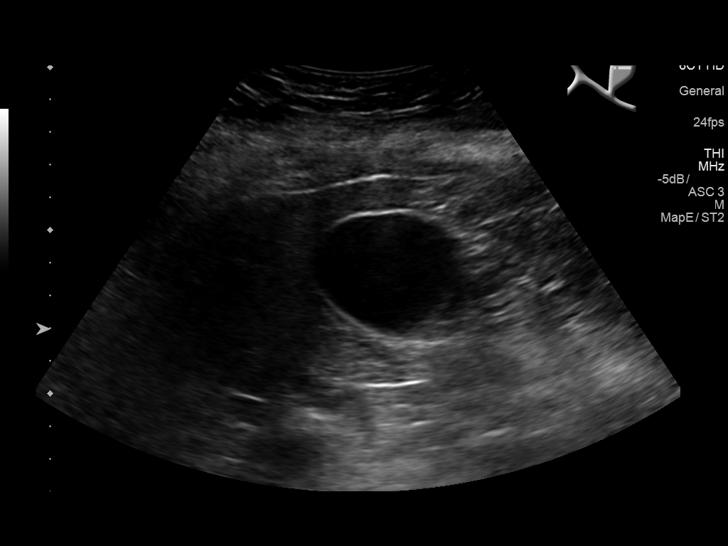
[im 14/81]
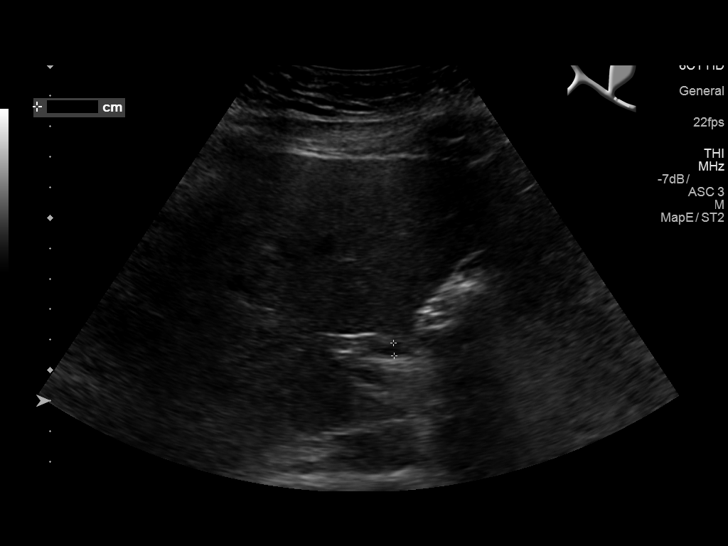
[im 21/81]
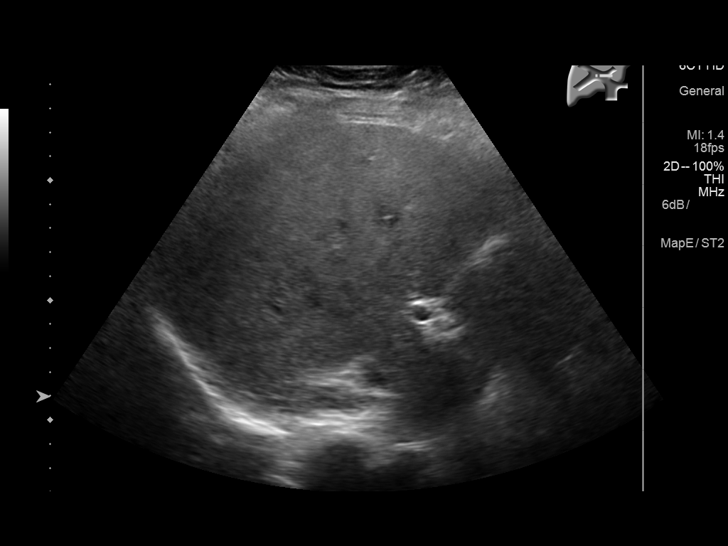
[im 27/81]
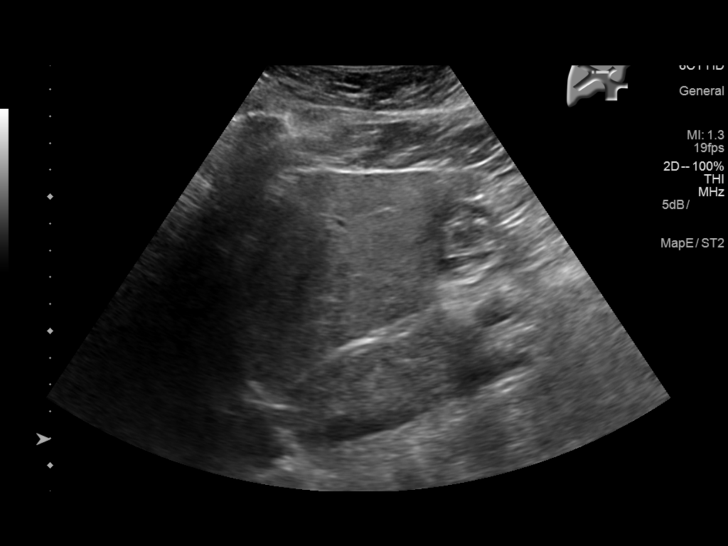
[im 31/81]
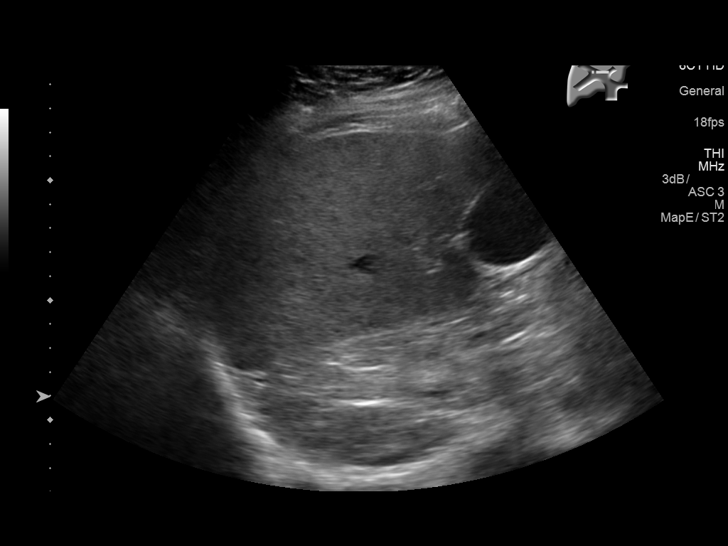
[im 37/81]
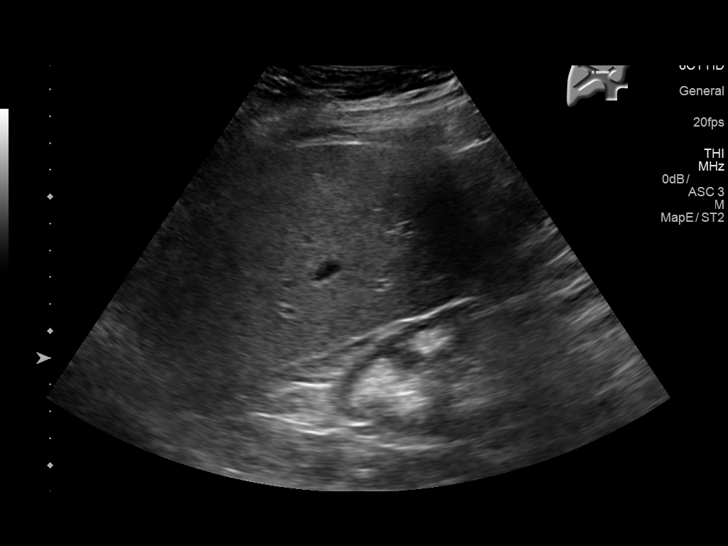
[im 44/81]
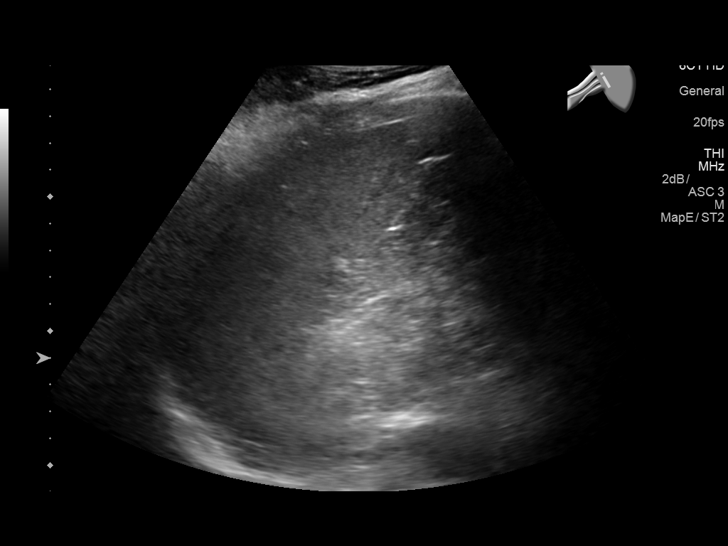
[im 51/81]
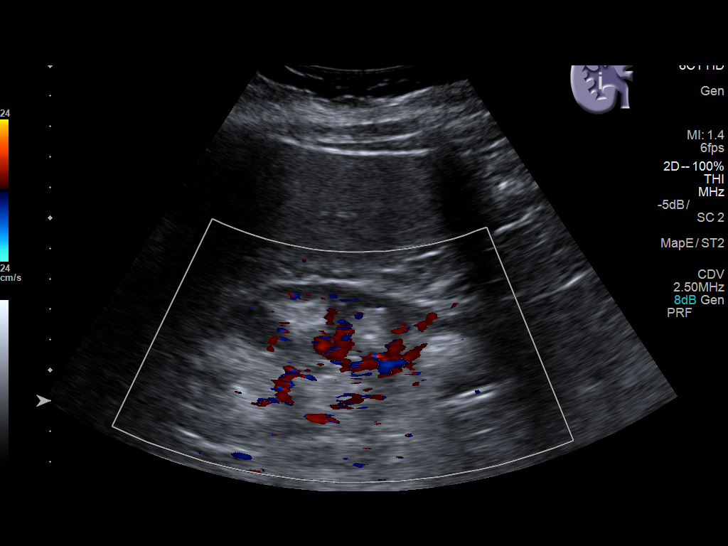
[im 54/81]
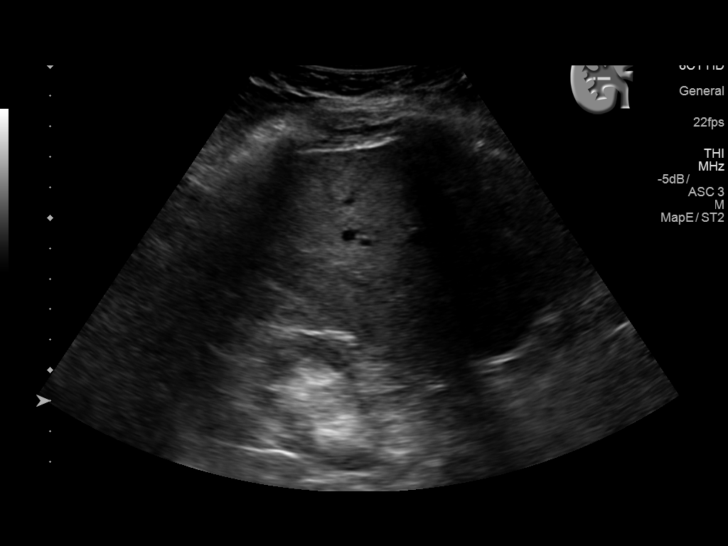
[im 61/81]
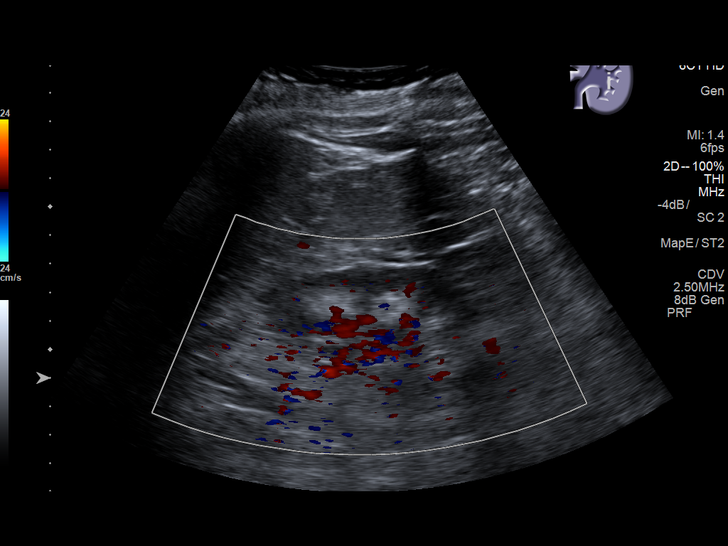
[im 67/81]
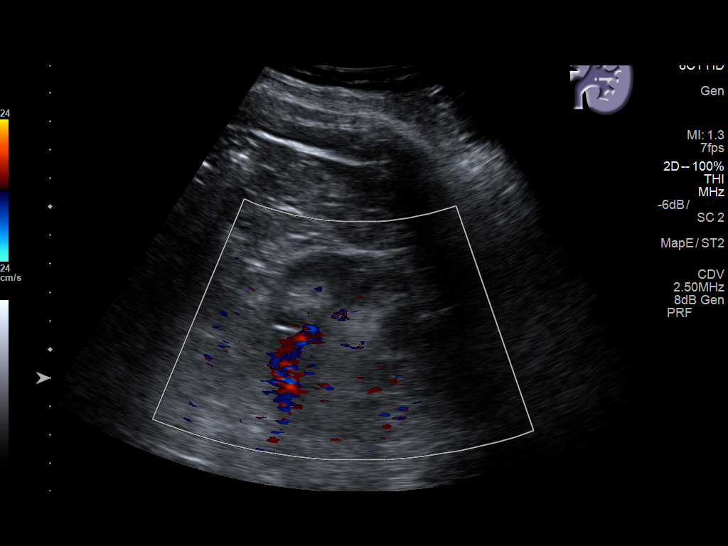
[im 74/81]
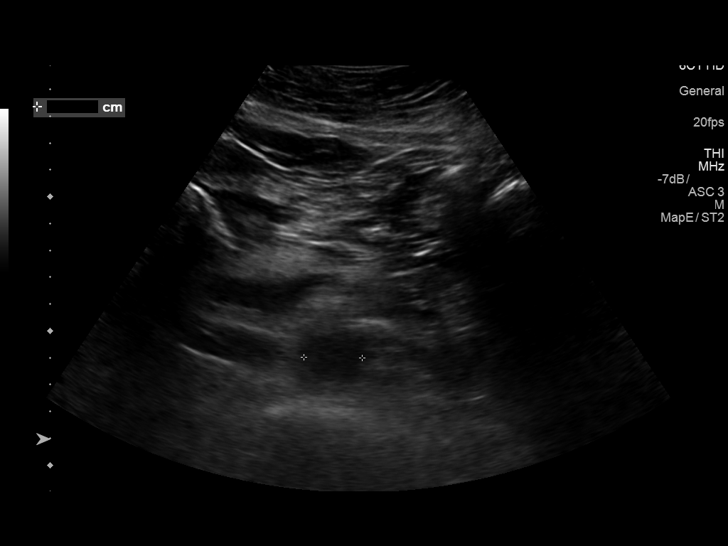
[im 81/81]
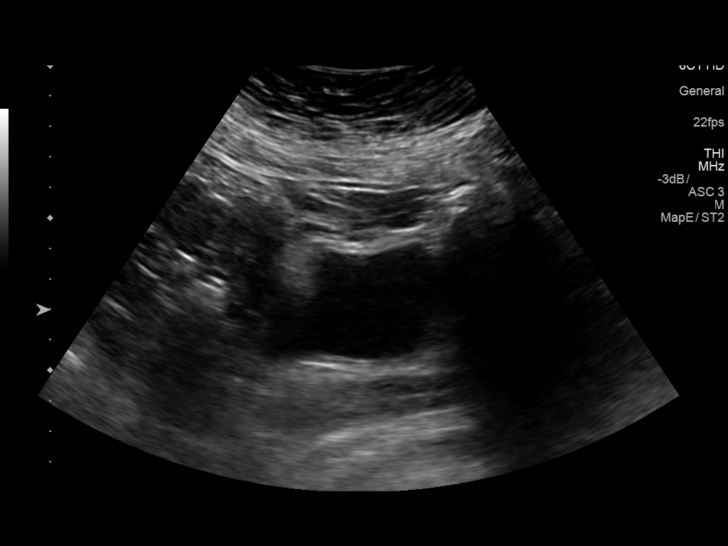

[14 of 25 positions shown; findings below may reference images not displayed]

FINDINGS: Gallbladder: No gallstones or wall thickening visualized. No
sonographic Murphy sign noted by sonographer.

Common bile duct: Diameter: 4 mm

Liver: Diffusely increased in echogenicity. No focal lesion
identified.

IVC: No abnormality visualized.

Pancreas: Visualized portion unremarkable.

Spleen: Size and appearance within normal limits.

Right Kidney: Length: 11.6 cm. No hydronephrosis. Renal cortical
thinning. Medullary echogenic foci.

Left Kidney: Length: 12.0 cm. Renal cortical thinning. No
hydronephrosis. Medullary echogenic foci.

Abdominal aorta: No aneurysm visualized.

Other findings: None.
IMPRESSION: No acute process.

No cholelithiasis or sonographic evidence for acute cholecystitis.

No hydronephrosis.

Findings suggestive of medullary nephrocalcinosis.

## 2017-10-14 ENCOUNTER — Emergency Department (HOSPITAL_COMMUNITY): Payer: Medicaid Other

## 2017-10-14 ENCOUNTER — Other Ambulatory Visit: Payer: Self-pay

## 2017-10-14 ENCOUNTER — Encounter (HOSPITAL_COMMUNITY): Payer: Self-pay | Admitting: Emergency Medicine

## 2017-10-14 ENCOUNTER — Observation Stay (HOSPITAL_COMMUNITY)
Admission: EM | Admit: 2017-10-14 | Discharge: 2017-10-15 | Disposition: A | Discharge status: Home / Self Care | Payer: Medicaid Other | Attending: Internal Medicine | Admitting: Internal Medicine

## 2017-10-14 DIAGNOSIS — N200 Calculus of kidney: Secondary | ICD-10-CM

## 2017-10-14 DIAGNOSIS — E876 Hypokalemia: Secondary | ICD-10-CM | POA: Insufficient documentation

## 2017-10-14 DIAGNOSIS — Z87442 Personal history of urinary calculi: Secondary | ICD-10-CM | POA: Insufficient documentation

## 2017-10-14 DIAGNOSIS — E7800 Pure hypercholesterolemia, unspecified: Secondary | ICD-10-CM | POA: Diagnosis not present

## 2017-10-14 DIAGNOSIS — I10 Essential (primary) hypertension: Secondary | ICD-10-CM | POA: Diagnosis not present

## 2017-10-14 DIAGNOSIS — G2581 Restless legs syndrome: Secondary | ICD-10-CM | POA: Insufficient documentation

## 2017-10-14 DIAGNOSIS — K219 Gastro-esophageal reflux disease without esophagitis: Secondary | ICD-10-CM | POA: Diagnosis present

## 2017-10-14 DIAGNOSIS — F410 Panic disorder [episodic paroxysmal anxiety] without agoraphobia: Secondary | ICD-10-CM | POA: Diagnosis not present

## 2017-10-14 DIAGNOSIS — G8929 Other chronic pain: Secondary | ICD-10-CM | POA: Diagnosis not present

## 2017-10-14 DIAGNOSIS — G4730 Sleep apnea, unspecified: Secondary | ICD-10-CM | POA: Insufficient documentation

## 2017-10-14 DIAGNOSIS — F419 Anxiety disorder, unspecified: Secondary | ICD-10-CM | POA: Insufficient documentation

## 2017-10-14 DIAGNOSIS — R000 Tachycardia, unspecified: Secondary | ICD-10-CM | POA: Diagnosis not present

## 2017-10-14 DIAGNOSIS — Z885 Allergy status to narcotic agent status: Secondary | ICD-10-CM | POA: Insufficient documentation

## 2017-10-14 DIAGNOSIS — K3184 Gastroparesis: Secondary | ICD-10-CM | POA: Insufficient documentation

## 2017-10-14 DIAGNOSIS — N12 Tubulo-interstitial nephritis, not specified as acute or chronic: Secondary | ICD-10-CM | POA: Diagnosis present

## 2017-10-14 DIAGNOSIS — Z888 Allergy status to other drugs, medicaments and biological substances status: Secondary | ICD-10-CM | POA: Diagnosis not present

## 2017-10-14 DIAGNOSIS — F329 Major depressive disorder, single episode, unspecified: Secondary | ICD-10-CM | POA: Insufficient documentation

## 2017-10-14 DIAGNOSIS — Z23 Encounter for immunization: Secondary | ICD-10-CM | POA: Insufficient documentation

## 2017-10-14 DIAGNOSIS — Z79899 Other long term (current) drug therapy: Secondary | ICD-10-CM | POA: Insufficient documentation

## 2017-10-14 DIAGNOSIS — F32A Depression, unspecified: Secondary | ICD-10-CM | POA: Diagnosis present

## 2017-10-14 DIAGNOSIS — Z6832 Body mass index (BMI) 32.0-32.9, adult: Secondary | ICD-10-CM | POA: Insufficient documentation

## 2017-10-14 DIAGNOSIS — I700 Atherosclerosis of aorta: Secondary | ICD-10-CM | POA: Diagnosis not present

## 2017-10-14 DIAGNOSIS — E669 Obesity, unspecified: Secondary | ICD-10-CM | POA: Diagnosis not present

## 2017-10-14 HISTORY — DX: Tubulo-interstitial nephritis, not specified as acute or chronic: N12

## 2017-10-14 HISTORY — DX: Angina pectoris, unspecified: I20.9

## 2017-10-14 HISTORY — DX: Personal history of urinary calculi: Z87.442

## 2017-10-14 HISTORY — DX: Unspecified atrial fibrillation: I48.91

## 2017-10-14 LAB — CBC
HCT: 36.6 % (ref 36.0–46.0)
Hemoglobin: 11.2 g/dL — ABNORMAL LOW (ref 12.0–15.0)
MCH: 26.7 pg (ref 26.0–34.0)
MCHC: 30.6 g/dL (ref 30.0–36.0)
MCV: 87.4 fL (ref 78.0–100.0)
PLATELETS: 196 10*3/uL (ref 150–400)
RBC: 4.19 MIL/uL (ref 3.87–5.11)
RDW: 14.4 % (ref 11.5–15.5)
WBC: 8.7 10*3/uL (ref 4.0–10.5)

## 2017-10-14 LAB — URINALYSIS, ROUTINE W REFLEX MICROSCOPIC
Bilirubin Urine: NEGATIVE
Glucose, UA: NEGATIVE mg/dL
Hgb urine dipstick: NEGATIVE
KETONES UR: 5 mg/dL — AB
Nitrite: POSITIVE — AB
PROTEIN: 100 mg/dL — AB
Specific Gravity, Urine: 1.024 (ref 1.005–1.030)
pH: 5 (ref 5.0–8.0)

## 2017-10-14 LAB — BASIC METABOLIC PANEL
Anion gap: 9 (ref 5–15)
BUN: 13 mg/dL (ref 6–20)
CALCIUM: 8.5 mg/dL — AB (ref 8.9–10.3)
CHLORIDE: 105 mmol/L (ref 98–111)
CO2: 23 mmol/L (ref 22–32)
CREATININE: 0.92 mg/dL (ref 0.44–1.00)
GFR calc non Af Amer: >60 mL/min (ref >60)
Glucose, Bld: 143 mg/dL — ABNORMAL HIGH (ref 70–99)
Potassium: 3.3 mmol/L — ABNORMAL LOW (ref 3.5–5.1)
SODIUM: 137 mmol/L (ref 135–145)

## 2017-10-14 LAB — TSH: TSH: 0.932 u[IU]/mL (ref 0.350–4.500)

## 2017-10-14 LAB — I-STAT TROPONIN, ED: Troponin i, poc: 0 ng/mL (ref 0.00–0.08)

## 2017-10-14 LAB — HEPATIC FUNCTION PANEL
ALT: 24 U/L (ref 0–44)
AST: 28 U/L (ref 15–41)
Albumin: 3.2 g/dL — ABNORMAL LOW (ref 3.5–5.0)
Alkaline Phosphatase: 74 U/L (ref 38–126)
Bilirubin, Direct: 0.2 mg/dL (ref 0.0–0.2)
Indirect Bilirubin: 0.5 mg/dL (ref 0.3–0.9)
Total Bilirubin: 0.7 mg/dL (ref 0.3–1.2)
Total Protein: 6.7 g/dL (ref 6.5–8.1)

## 2017-10-14 LAB — I-STAT CG4 LACTIC ACID, ED
LACTIC ACID, VENOUS: 0.98 mmol/L (ref 0.5–1.9)
Lactic Acid, Venous: 1.69 mmol/L (ref 0.5–1.9)

## 2017-10-14 LAB — I-STAT BETA HCG BLOOD, ED (MC, WL, AP ONLY)

## 2017-10-14 LAB — LIPASE, BLOOD: Lipase: 28 U/L (ref 11–51)

## 2017-10-14 MED ORDER — LISINOPRIL 10 MG PO TABS
10.0000 mg | ORAL_TABLET | Freq: Every day | ORAL | Status: DC
  Administered 2017-10-14 – 2017-10-15 (×2): 10 mg via ORAL
  Filled 2017-10-14 (×2): qty 1

## 2017-10-14 MED ORDER — PROCHLORPERAZINE EDISYLATE 10 MG/2ML IJ SOLN: 10.0000 mg | Freq: Once | INTRAMUSCULAR | Status: DC

## 2017-10-14 MED ORDER — METOPROLOL TARTRATE 12.5 MG HALF TABLET
12.5000 mg | ORAL_TABLET | Freq: Two times a day (BID) | ORAL | Status: DC
  Administered 2017-10-14 – 2017-10-15 (×3): 12.5 mg via ORAL
  Filled 2017-10-14 (×3): qty 1

## 2017-10-14 MED ORDER — MORPHINE SULFATE (PF) 4 MG/ML IV SOLN
4.0000 mg | Freq: Once | INTRAVENOUS | Status: AC
  Administered 2017-10-14: 4 mg via INTRAVENOUS
  Filled 2017-10-14: qty 1

## 2017-10-14 MED ORDER — SODIUM CHLORIDE 0.9 % IV BOLUS
1000.0000 mL | Freq: Once | INTRAVENOUS | Status: AC
  Administered 2017-10-14: 1000 mL via INTRAVENOUS

## 2017-10-14 MED ORDER — PNEUMOCOCCAL VAC POLYVALENT 25 MCG/0.5ML IJ INJ
0.5000 mL | INJECTION | INTRAMUSCULAR | Status: AC
  Administered 2017-10-15: 0.5 mL via INTRAMUSCULAR
  Filled 2017-10-14 (×2): qty 0.5

## 2017-10-14 MED ORDER — PANTOPRAZOLE SODIUM 40 MG PO TBEC
40.0000 mg | DELAYED_RELEASE_TABLET | Freq: Every day | ORAL | Status: DC
  Administered 2017-10-14 – 2017-10-15 (×2): 40 mg via ORAL
  Filled 2017-10-14 (×2): qty 1

## 2017-10-14 MED ORDER — ACETAMINOPHEN 500 MG PO TABS: 1000.0000 mg | ORAL_TABLET | Freq: Four times a day (QID) | ORAL | Status: DC | PRN

## 2017-10-14 MED ORDER — MELATONIN 3 MG PO TABS
9.0000 mg | ORAL_TABLET | Freq: Every day | ORAL | Status: DC
  Administered 2017-10-14: 9 mg via ORAL
  Filled 2017-10-14 (×3): qty 3

## 2017-10-14 MED ORDER — TAMSULOSIN HCL 0.4 MG PO CAPS
0.4000 mg | ORAL_CAPSULE | Freq: Every day | ORAL | Status: DC
  Administered 2017-10-14: 0.4 mg via ORAL
  Filled 2017-10-14: qty 1

## 2017-10-14 MED ORDER — PROMETHAZINE HCL 25 MG/ML IJ SOLN
12.5000 mg | Freq: Once | INTRAMUSCULAR | Status: AC
  Administered 2017-10-14: 12.5 mg via INTRAVENOUS
  Filled 2017-10-14: qty 1

## 2017-10-14 MED ORDER — POTASSIUM CHLORIDE IN NACL 20-0.9 MEQ/L-% IV SOLN
INTRAVENOUS | Status: DC
  Administered 2017-10-14 – 2017-10-15 (×2): via INTRAVENOUS
  Filled 2017-10-14 (×3): qty 1000

## 2017-10-14 MED ORDER — HYDROCODONE-ACETAMINOPHEN 5-325 MG PO TABS
1.0000 | ORAL_TABLET | ORAL | Status: DC | PRN
  Administered 2017-10-14 (×2): 1 via ORAL
  Filled 2017-10-14 (×2): qty 1

## 2017-10-14 MED ORDER — ENOXAPARIN SODIUM 30 MG/0.3ML ~~LOC~~ SOLN: 30.0000 mg | SUBCUTANEOUS | Status: DC

## 2017-10-14 MED ORDER — ONDANSETRON HCL 4 MG/2ML IJ SOLN
4.0000 mg | Freq: Four times a day (QID) | INTRAMUSCULAR | Status: DC | PRN
  Administered 2017-10-14: 4 mg via INTRAVENOUS
  Filled 2017-10-14: qty 2

## 2017-10-14 MED ORDER — SODIUM CHLORIDE 0.9 % IV SOLN
1.0000 g | INTRAVENOUS | Status: DC
  Administered 2017-10-15: 1 g via INTRAVENOUS
  Filled 2017-10-14: qty 10

## 2017-10-14 MED ORDER — SODIUM CHLORIDE 0.9 % IV SOLN
1.0000 g | Freq: Once | INTRAVENOUS | Status: AC
  Administered 2017-10-14: 1 g via INTRAVENOUS
  Filled 2017-10-14: qty 10

## 2017-10-14 MED ORDER — PHENAZOPYRIDINE HCL 100 MG PO TABS: 200.0000 mg | ORAL_TABLET | Freq: Three times a day (TID) | ORAL | Status: DC | PRN

## 2017-10-14 MED ORDER — ONDANSETRON HCL 4 MG/2ML IJ SOLN
4.0000 mg | Freq: Once | INTRAMUSCULAR | Status: AC
  Administered 2017-10-14: 4 mg via INTRAVENOUS
  Filled 2017-10-14: qty 2

## 2017-10-14 MED ORDER — TRAMADOL HCL 50 MG PO TABS: 50.0000 mg | ORAL_TABLET | Freq: Four times a day (QID) | ORAL | Status: DC | PRN

## 2017-10-14 NOTE — ED Triage Notes (Signed)
Patient presents to the ED with multiple complaints. Patient reports central chest pain and flank pain that started 3 days ago, nausea, dizziness, and headache. Patient reports she has been out of her medication for 9 months. Patient alert and oriented x4.

## 2017-10-14 NOTE — ED Notes (Signed)
Attempted to take patient upstairs, doctor is at bedside and requested 5 mins to talk to her.

## 2017-10-14 NOTE — H&P (Signed)
History and Physical    KENZLEY KE ZOX:[id number] DOB: 1970/10/17 DOA: 10/14/2017  PCP: Patient, No Pcp Per Consultants:  none Patient coming from: home  Chief Complaint: Flank pain  HPI: Sandra [name] is a 47 y.o. female with medical history significant for nephrolithiasis s/p R ureteral stent placement in Aug 2018, depression/mood disorder, chronic pain who presented to the ED from home with c/o 3 days of bilateral flank pain. She has had recurrent kidney stones in the past, h/o recurrent UTI since age 70. Symptoms started 3 days ago and were associated with nausea and vomiting and inability to keep down po. She has not kept down most of the fluids she has taken in the past 2 days; progressing to dry heaving. She has not had any fever, no dysuria. No diarrhea. She is worried that her HR has been high; states that for the past several weeks she has been more short of breath with usual activities; denies chest pain. No cough. No h/o heart or pulmonary disease. She has not taken any of her meds in 9 months, citing losing her insurance and not being able to pay for them.  ED Course: 2L IVF CT: no obstructing stones HR 110-120 Lactate OK WBC OK PO challenge failed Rocephin given Pain reported as 10/10, given morphine Pain remains 8.5  Review of Systems: As per HPI; otherwise review of systems reviewed and negative.   Ambulatory Status:  Ambulates without assistance  Past Medical History:  Diagnosis Date  . Anemia    a. Mild with Hgb 11.2, 09/2012. UA with mod Hgb. Instructed to f/u PCP.  Marland Kitchen Anxiety   . Anxiety   . Atrial fibrillation with RVR (HCC)    a. Dx 09/2012, spont conv to NSR.  Marland Kitchen Chronic lower back pain   . Depression   . Gastroparesis   . GERD (gastroesophageal reflux disease)   . Gestational diabetes   . H/O echocardiogram    a. 02/2010: done for htn/tachy while pregnant - EF 55-60%, grade 1 d/dysf, mildly dilated LA.  . High cholesterol   . Hypertension   .  Kidney stones   . Migraine   . Migraines   . Obesity   . Panic attack   . Restless legs   . Sleep apnea     Past Surgical History:  Procedure Laterality Date  . BACK SURGERY    . CESAREAN SECTION  2012  . CYSTOSCOPY W/ URETERAL STENT PLACEMENT  2006  . CYSTOSCOPY W/ URETERAL STENT PLACEMENT Right 09/16/2016   Procedure: CYSTOSCOPY WITH RETROGRADE PYELOGRAM/URETERAL STENT PLACEMENT;  Surgeon: Alfredo Martinez, MD;  Location: [facility];  Service: Urology;  Laterality: Right;  . CYSTOSCOPY/RETROGRADE/URETEROSCOPY/STONE EXTRACTION WITH BASKET  1987  . CYSTOSCOPY/URETEROSCOPY/HOLMIUM LASER/STENT PLACEMENT Right 09/28/2016   Procedure: CYSTOSCOPY RIGHT URETEROSCOPY WITH HOLMIUM LASER AND RIGHT STENT EXCHANGE;  Surgeon: Bjorn Pippin, MD;  Location: [facility];  Service: Urology;  Laterality: Right;  . DILATION AND CURETTAGE OF UTERUS  2006; 2010  . DILATION AND CURETTAGE, DIAGNOSTIC / THERAPEUTIC    . TUBAL LIGATION  2012    Social History   Socioeconomic History  . Marital status: Single    Spouse name: Not on file  . Number of children: Not on file  . Years of education: Not on file  . Highest education level: Not on file  Occupational History  . Not on file  Social Needs  . Financial resource strain: Not on file  . Food insecurity:    Worry:  Not on file    Inability: Not on file  . Transportation needs:    Medical: Not on file    Non-medical: Not on file  Tobacco Use  . Smoking status: Never Smoker  . Smokeless tobacco: Never Used  Substance and Sexual Activity  . Alcohol use: No  . Drug use: No  . Sexual activity: Not on file  Lifestyle  . Physical activity:    Days per week: Not on file    Minutes per session: Not on file  . Stress: Not on file  Relationships  . Social connections:    Talks on phone: Not on file    Gets together: Not on file    Attends religious service: Not on file    Active member of club or organization: Not on file    Attends meetings of clubs or  organizations: Not on file    Relationship status: Not on file  . Intimate partner violence:    Fear of current or ex partner: Not on file    Emotionally abused: Not on file    Physically abused: Not on file    Forced sexual activity: Not on file  Other Topics Concern  . Not on file  Social History Narrative   ** Merged History Encounter **       Separated, 6 children   Right handed   2 yr college   3-4 cans of soda          Allergies  Allergen Reactions  . Iodides Hives  . Imitrex [Sumatriptan]     Pt states she takes imitrex injection-"sometimes it works and sometimes doesn't"  . Iodine Hives  . Toradol [Ketorolac Tromethamine] Hives  . Metoclopramide Hcl Anxiety    Family History  Problem Relation Age of Onset  . Diabetes Mother   . Hypertension Mother   . Migraines Father   . Migraines Brother     Prior to Admission medications   Medication Sig Start Date End Date Taking? Authorizing Provider  acetaminophen (TYLENOL) 500 MG tablet Take 1,000 mg by mouth every 6 (six) hours as needed for mild pain or moderate pain.   Yes [provider]  dimenhyDRINATE (DRAMAMINE) 50 MG tablet Take 50 mg by mouth every 8 (eight) hours as needed for nausea.   Yes [provider]  diphenhydrAMINE (BENADRYL) 25 MG tablet Take 25 mg by mouth every 6 (six) hours as needed for allergies.   Yes [provider]  Esomeprazole Magnesium (NEXIUM PO) Take 2 capsules by mouth daily.   Yes [provider]  Melatonin 10 MG TABS Take 10 mg by mouth at bedtime.   Yes [provider]  naproxen sodium (ALEVE) 220 MG tablet Take 220 mg by mouth daily as needed (pain).   Yes [provider]  clonazePAM (KLONOPIN) 0.5 MG tablet Take 1 tablet (0.5 mg total) by mouth 2 (two) times daily for 2 days, THEN 0.5 tablets (0.25 mg total) 2 (two) times daily for 2 days, THEN 0.5 tablets (0.25 mg total) daily for 1 day. Patient not taking: Reported on 10/14/2017  06/14/17 06/19/17  Aviva KluverMurray, Alyssa B, PA-C  gabapentin (NEURONTIN) 300 MG capsule Take 300 mg by mouth 3 (three) times daily.    [provider]  ibuprofen (ADVIL,MOTRIN) 800 MG tablet Take 1 tablet (800 mg total) by mouth 3 (three) times daily. Patient not taking: Reported on 10/14/2017 04/16/17   Roxy HorsemanBrowning, Robert, PA-C  lisinopril (PRINIVIL,ZESTRIL) 40 MG tablet Take 40 mg  by mouth daily.    [provider]  lisinopril (PRINIVIL,ZESTRIL) 40 MG tablet Take 1 tablet (40 mg total) by mouth daily. Patient not taking: Reported on 10/14/2017 06/14/17 07/14/17  Aviva Kluver B, PA-C  metoprolol tartrate (LOPRESSOR) 25 MG tablet Take 0.5 tablets (12.5 mg total) by mouth 2 (two) times daily. For high blood pressure Patient not taking: Reported on 10/14/2017 05/31/16   Armandina Stammer I, NP  ondansetron (ZOFRAN) 4 MG tablet Take 1 tablet (4 mg total) by mouth every 8 (eight) hours as needed for nausea or vomiting. Patient not taking: Reported on 10/14/2017 09/17/16   Filippou, Marzetta Board, MD  oxyCODONE (OXY IR/ROXICODONE) 5 MG immediate release tablet Take 1 tablet (5 mg total) by mouth every 4 (four) hours as needed for moderate pain. Patient not taking: Reported on 10/14/2017 09/28/16   Bjorn Pippin, MD  phenazopyridine (PYRIDIUM) 200 MG tablet Take 1 tablet (200 mg total) by mouth 3 (three) times daily as needed (dsyuria). Patient not taking: Reported on 10/14/2017 09/17/16   Filippou, Marzetta Board, MD  solifenacin (VESICARE) 5 MG tablet Take 5 mg by mouth daily.    [provider]  tamsulosin (FLOMAX) 0.4 MG CAPS capsule Take 1 capsule (0.4 mg total) by mouth daily after supper. Patient not taking: Reported on 10/14/2017 09/17/16   Filippou, Marzetta Board, MD  traMADol (ULTRAM) 50 MG tablet Take 1 tablet (50 mg total) by mouth every 6 (six) hours as needed. Patient not taking: Reported on 10/14/2017 12/07/16   Gilda Crease, MD  traZODone (DESYREL) 150 MG tablet Take 600 mg by mouth at bedtime.     [provider]    Physical Exam: Vitals:   10/14/17 1215 10/14/17 1230 10/14/17 1242 10/14/17 1245  BP: (!) 129/91 117/78  120/79  Pulse: 95 96  99  Resp: (!) 22 (!) 22 (!) 23   Temp:      TempSrc:      SpO2: 98% 98%  97%     . General:  Appears calm and comfortable and is in NAD . Eyes:  PERRL, EOMI, normal lids, iris . ENT:  grossly normal hearing, lips & tongue, mmm; appropriate dentition . Neck:  no LAD, masses or thyromegaly; no carotid bruits . Cardiovascular:  tachycardic, reg rhythm, no murmur. Marland Kitchen Respiratory:   CTA bilaterally with no wheezes/rales/rhonchi.  Normal respiratory effort. . Abdomen:  soft, mildly TTP R and L flanks . Back:   grossly normal alignment, + CVAT bilaterally . Skin:  no rash or induration seen on limited exam . Musculoskeletal:  grossly normal tone BUE/BLE, good ROM, no bony abnormality or obvious joint deformity . Lower extremity:  No LE edema.  Limited foot exam with no ulcerations.  2+ distal pulses. Marland Kitchen Psychiatric: slightly anxious, speech fluent and appropriate, AOx3 . Neurologic:  CN 2-12 grossly intact, moves all extremities in coordinated fashion, sensation intact    Radiological Exams on Admission: Dg Chest 2 View  Result Date: 10/14/2017 CLINICAL DATA:  Chest pain, cough, and headache for 2 days, history atrial fibrillation, GERD, hypertension EXAM: CHEST - 2 VIEW COMPARISON:  05/24/2016 FINDINGS: Upper normal heart size. Mediastinal contours and pulmonary vascularity normal. LEFT basilar atelectasis. Remaining lungs clear. No infiltrate, pleural effusion or pneumothorax. Bones unremarkable. IMPRESSION: LEFT basilar atelectasis. Electronically Signed   By: Ulyses Southward M.D.   On: 10/14/2017 08:19   Ct Renal Stone Study  Result Date: 10/14/2017 CLINICAL DATA:  BILATERAL flank pain for 1 week, history kidney stones,  now with nausea, vomiting, and chills question pyelonephritis; additional history atrial fibrillation, hypertension,  GERD EXAM: CT ABDOMEN AND PELVIS WITHOUT CONTRAST TECHNIQUE: Multidetector CT imaging of the abdomen and pelvis was performed following the standard protocol without IV contrast. Sagittal and coronal MPR images reconstructed from axial data set. Oral contrast not administered for this indication COMPARISON:  09/15/2016 FINDINGS: Lower chest: Dependent atelectasis in both lungs Hepatobiliary: Gallbladder and liver normal appearance Pancreas: Normal appearance Spleen: Normal appearance Adrenals/Urinary Tract: Adrenal glands normal appearance. Medullary calcinosis bilaterally. Additional small focal calcifications likely represent tiny nonobstructing renal calculi. Mild perinephric edema greater on RIGHT unchanged. No hydronephrosis, hydroureter or ureteral calcification. RIGHT hydronephrosis and proximal RIGHT ureteral calculus seen on previous exam no longer identified. Lobulated renal contours without definite focal mass lesion. Bladder unremarkable. Stomach/Bowel: Normal appendix. Stomach and bowel loops normal appearance for technique Vascular/Lymphatic: Atherosclerotic calcification aorta without aneurysm. Circumaortic LEFT renal vein. No adenopathy. Reproductive: Unremarkable uterus and adnexa/ovaries Other: No free air or free fluid.  No acute inflammatory process. Musculoskeletal: Osseous structures unremarkable. IMPRESSION: Tiny BILATERAL nonobstructing renal calculi. BILATERAL renal medullary calcinosis. No acute intra-abdominal or intrapelvic abnormalities. Aortic Atherosclerosis (ICD10-I70.0). Electronically Signed   By: Ulyses Southward M.D.   On: 10/14/2017 09:43    EKG: Independently reviewed. Sinus tach with rate 124, no signs of ischemia  Labs on Admission: I have personally reviewed the available labs and imaging studies at the time of the admission.  Pertinent labs:  Lactate 1.69 -> 0.98 TnI 0.00 Na 137 K 3.3 Cl 105 CO2 23 Gluc 143 BUN 13 Creat 0.92 (baseline)  WBC 8.7 Hgb 11.2  (baseline) Plt 196  U/A amber, small leuks, positive nitrite, many bacteria, >50 WBC   Assessment/Plan Principal Problem:   Pyelonephritis Active Problems:   GERD (gastroesophageal reflux disease)   Depression   Essential hypertension   Nephrolithiasis    Pyelonephritis in pt with nonobstructing nephrolithiasis: with failure to improve sufficiently in the ED; admit to observation -f/u urine cultures -cont rocephin 1g q24h, deescalate as cultures indicate -f/u BCx x 2 -daily CBC -cont IVF; will bolus x 1 again and start maintenance fluids -tylenol, tramadol prn pain -cont flomax  Sinus tachycardia: likely due to infection / hypovolemia from vomiting; also was on BB in the past and is no longer taking for financial reasons; chart review reveals that she has presented with sinus tach several times in the past -IVF -resume home lopressor -check TSH  Hypokalemia: -NS with 20 mEq KCl at 125 cc/hour -repeat BMP in a.m.  HTN: uncontrolled here off her meds -resume lisinopril at lower dose; 10 mg instead of 40 mg as pt has not taken in 9 mo -resume lopressor 12.5 mg BID  Depression -cont home meds   DVT prophylaxis:  Lovenox Code Status:  Full - confirmed with patient/family Family Communication:   Disposition Plan:  Home once clinically improved Consults called: none  Admission status: It is my clinical opinion that referral for OBSERVATION is reasonable and necessary in this patient based on the above information provided. The aforementioned taken together are felt to place the patient at high risk for further clinical deterioration. However it is anticipated that the patient may be medically stable for discharge from the hospital within 24 to 48 hours.     Elyse Hsu MD [facility]  If note is complete, please contact covering daytime or nighttime physician. [email or web address] Password [email or web address]  10/14/2017, 3:13 PM

## 2017-10-14 NOTE — ED Provider Notes (Signed)
[facility] EMERGENCY DEPARTMENT Provider Note   CSN: [id number] Arrival date & time: 10/14/17  7829     History   Chief Complaint Chief Complaint  Patient presents with  . Chest Pain  . Flank Pain    HPI Sandra [name] is a 47 y.o. female.  The history is provided by the patient and medical records. No language interpreter was used.  Flank Pain  This is a recurrent problem. The current episode started more than 2 days ago. The problem occurs constantly. The problem has been gradually worsening. Associated symptoms include abdominal pain. Pertinent negatives include no chest pain, no headaches and no shortness of breath. Nothing aggravates the symptoms. Nothing relieves the symptoms. She has tried nothing for the symptoms. The treatment provided no relief.    Past Medical History:  Diagnosis Date  . Anemia    a. Mild with Hgb 11.2, 09/2012. UA with mod Hgb. Instructed to f/u PCP.  Marland Kitchen Anxiety   . Anxiety   . Atrial fibrillation with RVR (HCC)    a. Dx 09/2012, spont conv to NSR.  Marland Kitchen Chronic lower back pain   . Depression   . Gastroparesis   . GERD (gastroesophageal reflux disease)   . Gestational diabetes   . H/O echocardiogram    a. 02/2010: done for htn/tachy while pregnant - EF 55-60%, grade 1 d/dysf, mildly dilated LA.  . High cholesterol   . Hypertension   . Kidney stones   . Migraine   . Migraines   . Obesity   . Panic attack   . Restless legs   . Sleep apnea     Patient Active Problem List   Diagnosis Date Noted  . Nephrolithiasis 09/16/2016  . Essential hypertension 05/26/2016  . Accidental drug overdose   . Opiate overdose (HCC)   . Sepsis (HCC) 03/13/2016  . Pyelonephritis 03/13/2016  . Depression 03/13/2016  . AKI (acute kidney injury) (HCC) 03/13/2016  . Hypokalemia 03/13/2016  . 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 Major depressive disorder, single episode,  severe without psychosis (HCC) 11/15/2014  . Altered mental status 01/15/2014  . Acute encephalopathy 02/13/2013  . Acute respiratory failure (HCC) 02/13/2013  . Aspiration pneumonia (HCC) 02/13/2013  . Lumbar spondylosis 04/21/2011  . Trochanteric bursitis 04/21/2011  . Myofascial pain 04/21/2011  . TACHYCARDIA 03/06/2010  . DYSPNEA 01/06/2010  . RESTLESS LEG SYNDROME 12/11/2009  . MIGRAINE HEADACHE 12/11/2009  . GERD (gastroesophageal reflux disease) 12/11/2009  . Gastroparesis 12/11/2009  . DIABETES MELLITUS, GESTATIONAL, HX OF 12/11/2009    Past Surgical History:  Procedure Laterality Date  . BACK SURGERY    . CESAREAN SECTION  2012  . CYSTOSCOPY W/ URETERAL STENT PLACEMENT  2006  . CYSTOSCOPY W/ URETERAL STENT PLACEMENT Right 09/16/2016   Procedure: CYSTOSCOPY WITH RETROGRADE PYELOGRAM/URETERAL STENT PLACEMENT;  Surgeon: Alfredo Martinez, MD;  Location: [facility];  Service: Urology;  Laterality: Right;  . CYSTOSCOPY/RETROGRADE/URETEROSCOPY/STONE EXTRACTION WITH BASKET  1987  . CYSTOSCOPY/URETEROSCOPY/HOLMIUM LASER/STENT PLACEMENT Right 09/28/2016   Procedure: CYSTOSCOPY RIGHT URETEROSCOPY WITH HOLMIUM LASER AND RIGHT STENT EXCHANGE;  Surgeon: Bjorn Pippin, MD;  Location: [facility];  Service: Urology;  Laterality: Right;  . DILATION AND CURETTAGE OF UTERUS  2006; 2010  . DILATION AND CURETTAGE, DIAGNOSTIC / THERAPEUTIC    . TUBAL LIGATION  2012     OB History   None      Home Medications    Prior to  Admission medications   Medication Sig Start Date End Date Taking? Authorizing Provider  acetaminophen (TYLENOL) 500 MG tablet Take 1,000 mg by mouth every 6 (six) hours as needed for mild pain or moderate pain.    [provider]  acetaminophen (TYLENOL) 500 MG tablet Take 1,000 mg by mouth every 6 (six) hours as needed for moderate pain.    [provider]  clonazePAM (KLONOPIN) 0.5 MG tablet Take 1 tablet (0.5 mg total) by mouth 2 (two) times daily for 2 days,  THEN 0.5 tablets (0.25 mg total) 2 (two) times daily for 2 days, THEN 0.5 tablets (0.25 mg total) daily for 1 day. 06/14/17 06/19/17  Aviva KluverMurray, Alyssa B, PA-C  gabapentin (NEURONTIN) 300 MG capsule Take 300 mg by mouth 3 (three) times daily.    [provider]  ibuprofen (ADVIL,MOTRIN) 800 MG tablet Take 1 tablet (800 mg total) by mouth 3 (three) times daily. 04/16/17   Roxy HorsemanBrowning, Robert, PA-C  lisinopril (PRINIVIL,ZESTRIL) 40 MG tablet Take 40 mg by mouth daily.    [provider]  lisinopril (PRINIVIL,ZESTRIL) 40 MG tablet Take 1 tablet (40 mg total) by mouth daily. 06/14/17 07/14/17  Aviva KluverMurray, Alyssa B, PA-C  Melatonin 10 MG TABS Take 10 mg by mouth at bedtime.    [provider]  MELATONIN PO Take 1 tablet by mouth at bedtime as needed (sleep).    [provider]  metoprolol tartrate (LOPRESSOR) 25 MG tablet Take 0.5 tablets (12.5 mg total) by mouth 2 (two) times daily. For high blood pressure 05/31/16   Nwoko, Nicole KindredAgnes I, NP  ondansetron (ZOFRAN) 4 MG tablet Take 1 tablet (4 mg total) by mouth every 8 (eight) hours as needed for nausea or vomiting. 09/17/16   Filippou, Marzetta BoardPauline L, MD  oxyCODONE (OXY IR/ROXICODONE) 5 MG immediate release tablet Take 1 tablet (5 mg total) by mouth every 4 (four) hours as needed for moderate pain. 09/28/16   Bjorn PippinWrenn, John, MD  phenazopyridine (PYRIDIUM) 200 MG tablet Take 1 tablet (200 mg total) by mouth 3 (three) times daily as needed (dsyuria). 09/17/16   Filippou, Marzetta BoardPauline L, MD  solifenacin (VESICARE) 5 MG tablet Take 5 mg by mouth daily.    [provider]  tamsulosin (FLOMAX) 0.4 MG CAPS capsule Take 1 capsule (0.4 mg total) by mouth daily after supper. 09/17/16   Filippou, Marzetta BoardPauline L, MD  traMADol (ULTRAM) 50 MG tablet Take 1 tablet (50 mg total) by mouth every 6 (six) hours as needed. 12/07/16   Gilda CreasePollina, Ghalia Reicks J, MD  traZODone (DESYREL) 150 MG tablet Take 600 mg by mouth at bedtime.    [provider]    Family  History Family History  Problem Relation Age of Onset  . Diabetes Mother   . Hypertension Mother   . Migraines Father   . Migraines Brother     Social History Social History   Tobacco Use  . Smoking status: Never Smoker  . Smokeless tobacco: Never Used  Substance Use Topics  . Alcohol use: No  . Drug use: No     Allergies   Iodides; Imitrex [sumatriptan]; Iodine; Toradol [ketorolac tromethamine]; and Metoclopramide hcl   Review of Systems Review of Systems  Constitutional: Positive for chills, fatigue and fever. Negative for diaphoresis.  HENT: Negative for congestion.   Eyes: Negative for visual disturbance.  Respiratory: Negative for chest tightness and shortness of breath.   Cardiovascular: Negative for chest pain.  Gastrointestinal: Positive for abdominal pain, nausea and vomiting. Negative for constipation  and diarrhea.  Genitourinary: Positive for flank pain. Negative for frequency.  Musculoskeletal: Positive for back pain. Negative for neck pain and neck stiffness.  Neurological: Negative for light-headedness and headaches.  Psychiatric/Behavioral: Negative for agitation.  All other systems reviewed and are negative.    Physical Exam Updated Vital Signs BP 120/79   Pulse 99   Temp 99.6 F (37.6 C) (Rectal)   Resp (!) 23   LMP 10/07/2017   SpO2 97%   Physical Exam  Constitutional: She appears well-developed and well-nourished.  Non-toxic appearance. She appears ill. No distress.  HENT:  Head: Normocephalic and atraumatic.  Mouth/Throat: Oropharynx is clear and moist. No oropharyngeal exudate.  Eyes: Pupils are equal, round, and reactive to light. Conjunctivae and EOM are normal.  Neck: Normal range of motion. Neck supple.  Cardiovascular: Regular rhythm and intact distal pulses. Tachycardia present.  No murmur heard. Pulmonary/Chest: Effort normal and breath sounds normal. No respiratory distress. She has no wheezes. She exhibits no tenderness.   Abdominal: Soft. Normal appearance. She exhibits no distension. There is tenderness. There is CVA tenderness. There is no rigidity and no rebound.    Musculoskeletal: She exhibits tenderness. She exhibits no edema.       Lumbar back: She exhibits tenderness and pain.       Back:  Lymphadenopathy:    She has no cervical adenopathy.  Neurological: She is alert. No sensory deficit. She exhibits normal muscle tone.  Skin: Skin is warm. Capillary refill takes less than 2 seconds. No rash noted. She is diaphoretic. No erythema.  Psychiatric: She has a normal mood and affect.  Nursing note and vitals reviewed.    ED Treatments / Results  Labs (all labs ordered are listed, but only abnormal results are displayed) Labs Reviewed  BASIC METABOLIC PANEL - Abnormal; Notable for the following components:      Result Value   Potassium 3.3 (*)    Glucose, Bld 143 (*)    Calcium 8.5 (*)    All other components within normal limits  CBC - Abnormal; Notable for the following components:   Hemoglobin 11.2 (*)    All other components within normal limits  URINALYSIS, ROUTINE W REFLEX MICROSCOPIC - Abnormal; Notable for the following components:   Color, Urine AMBER (*)    APPearance CLOUDY (*)    Ketones, ur 5 (*)    Protein, ur 100 (*)    Nitrite POSITIVE (*)    Leukocytes, UA SMALL (*)    WBC, UA >50 (*)    Bacteria, UA MANY (*)    Non Squamous Epithelial 0-5 (*)    All other components within normal limits  URINE CULTURE  CULTURE, BLOOD (ROUTINE X 2)  CULTURE, BLOOD (ROUTINE X 2)  HEPATIC FUNCTION PANEL  LIPASE, BLOOD  HIV ANTIBODY (ROUTINE TESTING)  CBC  CREATININE, SERUM  TSH  I-STAT TROPONIN, ED  I-STAT BETA HCG BLOOD, ED ([facility], [facility], [facility] ONLY)  I-STAT CG4 LACTIC ACID, ED  I-STAT CG4 LACTIC ACID, ED    EKG EKG Interpretation  Date/Time:  Friday October 14 2017 07:33:15 EDT Ventricular Rate:  124 PR Interval:    QRS Duration: 73 QT Interval:  309 QTC Calculation: 444 R  Axis:   15 Text Interpretation:  Sinus tachycardia Baseline wander in lead(s) II III aVF V1 When compared to prior, faster rate.  No STEMI Confirmed by Theda Belfast ([id number]) on 10/14/2017 8:45:42 AM   Radiology Dg Chest 2 View  Result Date: 10/14/2017 CLINICAL DATA:  Chest pain, cough, and headache for 2 days, history atrial fibrillation, GERD, hypertension EXAM: CHEST - 2 VIEW COMPARISON:  05/24/2016 FINDINGS: Upper normal heart size. Mediastinal contours and pulmonary vascularity normal. LEFT basilar atelectasis. Remaining lungs clear. No infiltrate, pleural effusion or pneumothorax. Bones unremarkable. IMPRESSION: LEFT basilar atelectasis. Electronically Signed   By: Ulyses Southward M.D.   On: 10/14/2017 08:19   Ct Renal Stone Study  Result Date: 10/14/2017 CLINICAL DATA:  BILATERAL flank pain for 1 week, history kidney stones, now with nausea, vomiting, and chills question pyelonephritis; additional history atrial fibrillation, hypertension, GERD EXAM: CT ABDOMEN AND PELVIS WITHOUT CONTRAST TECHNIQUE: Multidetector CT imaging of the abdomen and pelvis was performed following the standard protocol without IV contrast. Sagittal and coronal MPR images reconstructed from axial data set. Oral contrast not administered for this indication COMPARISON:  09/15/2016 FINDINGS: Lower chest: Dependent atelectasis in both lungs Hepatobiliary: Gallbladder and liver normal appearance Pancreas: Normal appearance Spleen: Normal appearance Adrenals/Urinary Tract: Adrenal glands normal appearance. Medullary calcinosis bilaterally. Additional small focal calcifications likely represent tiny nonobstructing renal calculi. Mild perinephric edema greater on RIGHT unchanged. No hydronephrosis, hydroureter or ureteral calcification. RIGHT hydronephrosis and proximal RIGHT ureteral calculus seen on previous exam no longer identified. Lobulated renal contours without definite focal mass lesion. Bladder unremarkable. Stomach/Bowel:  Normal appendix. Stomach and bowel loops normal appearance for technique Vascular/Lymphatic: Atherosclerotic calcification aorta without aneurysm. Circumaortic LEFT renal vein. No adenopathy. Reproductive: Unremarkable uterus and adnexa/ovaries Other: No free air or free fluid.  No acute inflammatory process. Musculoskeletal: Osseous structures unremarkable. IMPRESSION: Tiny BILATERAL nonobstructing renal calculi. BILATERAL renal medullary calcinosis. No acute intra-abdominal or intrapelvic abnormalities. Aortic Atherosclerosis (ICD10-I70.0). Electronically Signed   By: Ulyses Southward M.D.   On: 10/14/2017 09:43    Procedures Procedures (including critical care time)  Medications Ordered in ED Medications  acetaminophen (TYLENOL) tablet 1,000 mg (has no administration in time range)  lisinopril (PRINIVIL,ZESTRIL) tablet 10 mg (has no administration in time range)  metoprolol tartrate (LOPRESSOR) tablet 12.5 mg (has no administration in time range)  pantoprazole (PROTONIX) EC tablet 40 mg (has no administration in time range)  tamsulosin (FLOMAX) capsule 0.4 mg (has no administration in time range)  Melatonin TABS 9 mg (has no administration in time range)  enoxaparin (LOVENOX) injection 30 mg (has no administration in time range)  cefTRIAXone (ROCEPHIN) 1 g in sodium chloride 0.9 % 100 mL IVPB (has no administration in time range)  0.9 % NaCl with KCl 20 mEq/ L  infusion (has no administration in time range)  sodium chloride 0.9 % bolus 1,000 mL (0 mLs Intravenous Stopped 10/14/17 1044)  morphine 4 MG/ML injection 4 mg (4 mg Intravenous Given 10/14/17 0829)  ondansetron (ZOFRAN) injection 4 mg (4 mg Intravenous Given 10/14/17 0824)  morphine 4 MG/ML injection 4 mg (4 mg Intravenous Given 10/14/17 1207)  cefTRIAXone (ROCEPHIN) 1 g in sodium chloride 0.9 % 100 mL IVPB (0 g Intravenous Stopped 10/14/17 1246)  sodium chloride 0.9 % bolus 1,000 mL (0 mLs Intravenous Stopped 10/14/17 1600)  morphine 4 MG/ML  injection 4 mg (4 mg Intravenous Given 10/14/17 1502)     Initial Impression / Assessment and Plan / ED Course  I have reviewed the triage vital signs and the nursing notes.  Pertinent labs & imaging results that were available during my care of the patient were reviewed by me and considered in my medical decision making (see chart for details).     Sandra [name] is a 47 y.o.  female with a past medical history significant for hypertension, diabetes, GERD, paroxysmal Atrial fibrillation not on anticoagulants and, migraines, gastroparesis, and prior kidney stones requiring surgery who presents with subjective fevers, chills, nausea, vomiting, severe bilateral flank pain, urinary changes, exertional shortness of breath, and chest pain.  Patient reports that her symptoms been ongoing for the last 3 days.  She reports that her symptoms feel somewhat like when she had prior infected stone and pyelonephritis.  She reports that she has had a change in her urine appearance as well as bilateral flank pain.  She describes her flank pain as 10 out of 10 and going back to her CVA areas.  She reports exertional shortness of breath and some chest pain.  She describes the chest pain as dull and central.  It does not radiate.  She does report diaphoresis more with her nausea and vomiting and the flank pain that her chest pain.  She denies significant cough.  She denies any recent sick contacts.  She denies constipation or diarrhea.  She denies any leg pain, leg swelling, or other symptoms.  On arrival, patient's EKG showed a sinus tachycardia with a rate in the 120s.  No STEMI or A. fib was seen.  Initial exam revealed bilateral CVA tenderness and flank tenderness.  No other abdominal tenderness.  Lungs were clear, chest was nontender.  Patient symmetric pulses in upper and lower extremities.  No lower extremity edema was seen.  Exam otherwise unremarkable.  Clinically I suspect patient has either bilateral  pyelonephritis or kidney stones given her history of the same.  Patient has no history of cardiac disease but with her risk factors, will obtain labs and imaging to rule out a cardiac or pulmonary cause of symptoms.  Anticipate reassessment after work-up.  2:36 PM Urinalysis showed nitrites leukocytes and bacteria.  Suspect pyelonephritis given history and similar symptoms.  Patient CT scan did not show obstructing and infected stones.  Patient had stones in her kidneys.   Clinically I suspect patient has pyelonephritis.  She was given a dose of Rocephin and more fluids.  After second liter fluids, her heart rate is still in the 1 10-1 20 range.  She still feels pain and nausea.  She does not feel comfortable going home as bad as she is feeling.    Given her persistent tachycardia with pyelonephritis, patient will be given more fluids and admitted for further management.    Final Clinical Impressions(s) / ED Diagnoses   Final diagnoses:  Pyelonephritis    ED Discharge Orders    None     Clinical Impression: 1. Pyelonephritis     Disposition: Admit  This note was prepared with assistance of Dragon voice recognition software. Occasional wrong-word or sound-a-like substitutions may have occurred due to the inherent limitations of voice recognition software.     Woodie Trusty, Canary Brim, MD 10/14/17 [phone]

## 2017-10-15 ENCOUNTER — Encounter (HOSPITAL_COMMUNITY): Payer: Self-pay | Admitting: Internal Medicine

## 2017-10-15 DIAGNOSIS — N12 Tubulo-interstitial nephritis, not specified as acute or chronic: Secondary | ICD-10-CM

## 2017-10-15 DIAGNOSIS — I10 Essential (primary) hypertension: Secondary | ICD-10-CM

## 2017-10-15 LAB — BASIC METABOLIC PANEL
Anion gap: 8 (ref 5–15)
BUN: 16 mg/dL (ref 6–20)
CO2: 23 mmol/L (ref 22–32)
Calcium: 8.3 mg/dL — ABNORMAL LOW (ref 8.9–10.3)
Chloride: 107 mmol/L (ref 98–111)
Creatinine, Ser: 0.89 mg/dL (ref 0.44–1.00)
GFR calc Af Amer: >60 mL/min (ref >60)
GFR calc non Af Amer: >60 mL/min (ref >60)
Glucose, Bld: 109 mg/dL — ABNORMAL HIGH (ref 70–99)
Potassium: 4.3 mmol/L (ref 3.5–5.1)
Sodium: 138 mmol/L (ref 135–145)

## 2017-10-15 LAB — CBC
HCT: 32.1 % — ABNORMAL LOW (ref 36.0–46.0)
Hemoglobin: 9.8 g/dL — ABNORMAL LOW (ref 12.0–15.0)
MCH: 27.2 pg (ref 26.0–34.0)
MCHC: 30.5 g/dL (ref 30.0–36.0)
MCV: 89.2 fL (ref 78.0–100.0)
Platelets: 143 10*3/uL — ABNORMAL LOW (ref 150–400)
RBC: 3.6 MIL/uL — ABNORMAL LOW (ref 3.87–5.11)
RDW: 14.6 % (ref 11.5–15.5)
WBC: 4.8 10*3/uL (ref 4.0–10.5)

## 2017-10-15 LAB — HIV ANTIBODY (ROUTINE TESTING W REFLEX): HIV Screen 4th Generation wRfx: NONREACTIVE

## 2017-10-15 MED ORDER — LISINOPRIL 20 MG PO TABS: 20.0000 mg | ORAL_TABLET | Freq: Every day | ORAL | Status: DC

## 2017-10-15 MED ORDER — HYDROXYZINE HCL 10 MG PO TABS: 10.0000 mg | ORAL_TABLET | Freq: Three times a day (TID) | ORAL | 0 refills | Status: AC | PRN

## 2017-10-15 MED ORDER — CIPROFLOXACIN HCL 500 MG PO TABS: 500.0000 mg | ORAL_TABLET | Freq: Two times a day (BID) | ORAL | Status: DC

## 2017-10-15 MED ORDER — HYDROXYZINE HCL 10 MG PO TABS: 10.0000 mg | ORAL_TABLET | Freq: Three times a day (TID) | ORAL | Status: DC | PRN

## 2017-10-15 MED ORDER — METOPROLOL TARTRATE 25 MG PO TABS: 12.5000 mg | ORAL_TABLET | Freq: Two times a day (BID) | ORAL | 1 refills | Status: DC

## 2017-10-15 MED ORDER — ENOXAPARIN SODIUM 40 MG/0.4ML ~~LOC~~ SOLN: 40.0000 mg | SUBCUTANEOUS | Status: DC

## 2017-10-15 MED ORDER — CIPROFLOXACIN HCL 500 MG PO TABS: 500.0000 mg | ORAL_TABLET | Freq: Two times a day (BID) | ORAL | 0 refills | Status: AC

## 2017-10-15 MED ORDER — LISINOPRIL 20 MG PO TABS: 20.0000 mg | ORAL_TABLET | Freq: Every day | ORAL | 1 refills | Status: DC

## 2017-10-15 MED ORDER — PROMETHAZINE HCL 25 MG PO TABS: 25.0000 mg | ORAL_TABLET | Freq: Four times a day (QID) | ORAL | Status: DC | PRN

## 2017-10-15 MED ORDER — CEFDINIR 300 MG PO CAPS
300.0000 mg | ORAL_CAPSULE | Freq: Two times a day (BID) | ORAL | Status: DC
  Filled 2017-10-15: qty 1

## 2017-10-15 MED ORDER — PANTOPRAZOLE SODIUM 40 MG PO TBEC: 40.0000 mg | DELAYED_RELEASE_TABLET | Freq: Every day | ORAL | 0 refills | Status: DC

## 2017-10-15 NOTE — Discharge Summary (Addendum)
Physician Discharge Summary  Sandra [name] UJW:[id number] DOB: Jul 15, 1970 DOA: 10/14/2017  PCP: Patient, No Pcp Per- patient given information to call for PCP at either [facility] or [facility]  Admit date: 10/14/2017 Discharge date: 10/15/2017  Time spent: 65 minutes  Recommendations for Outpatient Follow-up:  1. Follow blood and urine cultures to final (sending patient on  2. Follow up with PCP as noted below in 1-2 weeks for evaluation of kidney function, uti and BP control. 3. Take medications as directed-- provided match letter for prescriptions   Discharge Diagnoses:  Principal Problem:   Pyelonephritis Active Problems:   Sinus tachycardia   Essential hypertension   Nephrolithiasis   GERD (gastroesophageal reflux disease)   Depression   Discharge Condition: stable  Diet recommendation: heart healthy  Filed Weights   10/14/17 1600  Weight: 86.2 kg    History of present illness:  Sandra [name] is a 47 y.o. female with medical history significant for nephrolithiasis s/p R ureteral stent placement in Aug 2018, depression/mood disorder, chronic pain who presented to the ED from home with c/o 3 days of bilateral flank pain. She reported recurrent kidney stones in the past, h/o recurrent UTI since age 72. Symptoms started 3 days prior to admission and were associated with nausea and vomiting and inability to keep down po. No fever, no dysuria. No diarrhea. no cough, no chest pain no sob or LE edema. No h/o heart or pulmonary disease. She has not taken any of her meds in 9 months, citing losing her insurance and not being able to pay for them.  Hospital Course:  Pyelonephritis in pt with nonobstructing nephrolithiasis: with failure to improve sufficiently in the ED; renal US with tiny bilateral nonobstrucing renal calculi. Blood culture no growth at discharge. urine cultures with e coli and klebsiella pneumoniae. She remained afebrile and non-toxic  appearing.  Given rocephin in hospital and discharged with cipro.   Sinus tachycardia: resolved at discharge.  likely due to infection / hypovolemia from vomiting; also was on BB in the past and no longer taking for financial reasons. TSH o.93. Resume BB at discharge  Hypokalemia: Resolved at discharge  HTN: uncontrolled here off her meds. Resumed lisinopril at higher dose; resumed lopressor 12.5 mg BID  Anemia -outpatient follow up and work up  Anxiety/chronic pain -outpatient follow up   Procedures:  none  Consultations:  none  Discharge Exam: Vitals:   10/15/17 1100 10/15/17 1418  BP: (!) 143/96 (!) 159/103  Pulse: 72 75  Resp: 18 18  Temp: 97.9 F (36.6 C) 98.7 F (37.1 C)  SpO2: 97% 100%    General: well nourished in no acute distress.  Cardiovascular: RRR no MGR no LE edema Respiratory: normal effort BS clear bilaterally no wheeze  Discharge Instructions   Discharge Instructions    Call MD for:  persistant dizziness or light-headedness   Complete by:  As directed    Call MD for:  temperature >100.4   Complete by:  As directed    Diet general   Complete by:  As directed    Increase activity slowly   Complete by:  As directed      Allergies as of 10/15/2017      Reactions   Iodides Hives   Imitrex [sumatriptan]    Pt states she takes imitrex injection-"sometimes it works and sometimes doesn't"   Iodine Hives   Toradol [ketorolac Tromethamine] Hives   Metoclopramide Hcl Anxiety      Medication  List    STOP taking these medications   acetaminophen 500 MG tablet Commonly known as:  TYLENOL   clonazePAM 0.5 MG tablet Commonly known as:  KLONOPIN   dimenhyDRINATE 50 MG tablet Commonly known as:  DRAMAMINE   diphenhydrAMINE 25 MG tablet Commonly known as:  BENADRYL   ibuprofen 800 MG tablet Commonly known as:  ADVIL,MOTRIN   Melatonin 10 MG Tabs   naproxen sodium 220 MG tablet Commonly known as:  ALEVE   NEXIUM PO Replaced by:   pantoprazole 40 MG tablet   ondansetron 4 MG tablet Commonly known as:  ZOFRAN   oxyCODONE 5 MG immediate release tablet Commonly known as:  Oxy IR/ROXICODONE   phenazopyridine 200 MG tablet Commonly known as:  PYRIDIUM   solifenacin 5 MG tablet Commonly known as:  VESICARE   tamsulosin 0.4 MG Caps capsule Commonly known as:  FLOMAX   traMADol 50 MG tablet Commonly known as:  ULTRAM   traZODone 150 MG tablet Commonly known as:  DESYREL     TAKE these medications   ciprofloxacin 500 MG tablet Commonly known as:  CIPRO Take 1 tablet (500 mg total) by mouth 2 (two) times daily for 10 days.   gabapentin 300 MG capsule Commonly known as:  NEURONTIN Take 300 mg by mouth 3 (three) times daily.   hydrOXYzine 10 MG tablet Commonly known as:  ATARAX/VISTARIL Take 1 tablet (10 mg total) by mouth 3 (three) times daily as needed for anxiety.   lisinopril 20 MG tablet Commonly known as:  PRINIVIL,ZESTRIL Take 1 tablet (20 mg total) by mouth daily. Start taking on:  10/16/2017 What changed:    medication strength  how much to take  Another medication with the same name was removed. Continue taking this medication, and follow the directions you see here.   metoprolol tartrate 25 MG tablet Commonly known as:  LOPRESSOR Take 0.5 tablets (12.5 mg total) by mouth 2 (two) times daily. What changed:  additional instructions   pantoprazole 40 MG tablet Commonly known as:  PROTONIX Take 1 tablet (40 mg total) by mouth daily. Start taking on:  10/16/2017 Replaces:  NEXIUM PO      Allergies  Allergen Reactions  . Iodides Hives  . Imitrex [Sumatriptan]     Pt states she takes imitrex injection-"sometimes it works and sometimes doesn't"  . Iodine Hives  . Toradol [Ketorolac Tromethamine] Hives  . Metoclopramide Hcl Anxiety   Follow-up Information    [facility] Follow up.   Why:  Please call Tuesday morning to make appointment.  If you establish  care at this primary care office, you may use the pharmacy at this location for $4-$10/each.  If unable to get appointment, go to open clinic hours at 8am on Thursday. Contact information: [address] [place] [address] [phone]       [facility] Follow up.   Specialty:  Internal Medicine Why:  Call to make an appointment if unable to get appointment at [facility].  If you establish care at this primary care office, you may use the pharmacy at [facility] location for $4-$10/each Contact information: [address] [place] [address] [phone]       [facility] Follow up.   Why:   Call to make an appointment if unable to get appointment at [facility] or [facility]. If you establish care at this primary care office, you may  use the pharmacy at [facility] location for $4-$10/each Contact information: [address] [facility] [place] [phone]           The results of significant diagnostics from this hospitalization (including imaging, microbiology, ancillary and laboratory) are listed below for reference.    Significant Diagnostic Studies: Dg Chest 2 View  Result Date: 10/14/2017 CLINICAL DATA:  Chest pain, cough, and headache for 2 days, history atrial fibrillation, GERD, hypertension EXAM: CHEST - 2 VIEW COMPARISON:  05/24/2016 FINDINGS: Upper normal heart size. Mediastinal contours and pulmonary vascularity normal. LEFT basilar atelectasis. Remaining lungs clear. No infiltrate, pleural effusion or pneumothorax. Bones unremarkable. IMPRESSION: LEFT basilar atelectasis. Electronically Signed   By: Ulyses SouthwardMark  Boles M.D.   On: 10/14/2017 08:19   Ct Renal Stone Study  Result Date: 10/14/2017 CLINICAL DATA:  BILATERAL flank pain for 1 week, history kidney stones, now with nausea, vomiting, and chills question pyelonephritis; additional history atrial fibrillation, hypertension,  GERD EXAM: CT ABDOMEN AND PELVIS WITHOUT CONTRAST TECHNIQUE: Multidetector CT imaging of the abdomen and pelvis was performed following the standard protocol without IV contrast. Sagittal and coronal MPR images reconstructed from axial data set. Oral contrast not administered for this indication COMPARISON:  09/15/2016 FINDINGS: Lower chest: Dependent atelectasis in both lungs Hepatobiliary: Gallbladder and liver normal appearance Pancreas: Normal appearance Spleen: Normal appearance Adrenals/Urinary Tract: Adrenal glands normal appearance. Medullary calcinosis bilaterally. Additional small focal calcifications likely represent tiny nonobstructing renal calculi. Mild perinephric edema greater on RIGHT unchanged. No hydronephrosis, hydroureter or ureteral calcification. RIGHT hydronephrosis and proximal RIGHT ureteral calculus seen on previous exam no longer identified. Lobulated renal contours without definite focal mass lesion. Bladder unremarkable. Stomach/Bowel: Normal appendix. Stomach and bowel loops normal appearance for technique Vascular/Lymphatic: Atherosclerotic calcification aorta without aneurysm. Circumaortic LEFT renal vein. No adenopathy. Reproductive: Unremarkable uterus and adnexa/ovaries Other: No free air or free fluid.  No acute inflammatory process. Musculoskeletal: Osseous structures unremarkable. IMPRESSION: Tiny BILATERAL nonobstructing renal calculi. BILATERAL renal medullary calcinosis. No acute intra-abdominal or intrapelvic abnormalities. Aortic Atherosclerosis (ICD10-I70.0). Electronically Signed   By: Ulyses SouthwardMark  Boles M.D.   On: 10/14/2017 09:43    Microbiology: Recent Results (from the past 240 hour(s))  Blood culture (routine x 2)     Status: None (Preliminary result)   Collection Time: 10/14/17  8:43 AM  Result Value Ref Range Status   Specimen Description BLOOD BLOOD LEFT WRIST  Final   Special Requests   Final    BOTTLES DRAWN AEROBIC AND ANAEROBIC Blood Culture results may  not be optimal due to an inadequate volume of blood received in culture bottles   Culture   Final    NO GROWTH 1 DAY Performed at [facility], [address] [place], [place] [address]    Report Status PENDING  Incomplete  Blood culture (routine x 2)     Status: None (Preliminary result)   Collection Time: 10/14/17  9:12 AM  Result Value Ref Range Status   Specimen Description BLOOD LEFT ANTECUBITAL  Final   Special Requests   Final    BOTTLES DRAWN AEROBIC AND ANAEROBIC Blood Culture results may not be optimal due to an inadequate volume of blood received in culture bottles   Culture   Final    NO GROWTH 1 DAY Performed at [facility], [address] [place], [place] [address]    Report Status PENDING  Incomplete  Urine culture     Status: Abnormal (Preliminary result)   Collection Time:  10/14/17 10:15 AM  Result Value Ref Range Status   Specimen Description URINE, RANDOM  Final   Special Requests   Final    NONE Performed at [facility], [address], [place], [place] [address]    Culture (A)  Final    >=100,000 COLONIES/mL ESCHERICHIA COLI >=100,000 COLONIES/mL KLEBSIELLA PNEUMONIAE    Report Status PENDING  Incomplete     Labs: Basic Metabolic Panel: Recent Labs  Lab 10/14/17 0735 10/15/17 0342  NA 137 138  K 3.3* 4.3  CL 105 107  CO2 23 23  GLUCOSE 143* 109*  BUN 13 16  CREATININE 0.92 0.89  CALCIUM 8.5* 8.3*   Liver Function Tests: Recent Labs  Lab 10/14/17 0735  AST 28  ALT 24  ALKPHOS 74  BILITOT 0.7  PROT 6.7  ALBUMIN 3.2*   Recent Labs  Lab 10/14/17 0735  LIPASE 28   No results for input(s): AMMONIA in the last 168 hours. CBC: Recent Labs  Lab 10/14/17 0735 10/15/17 0342  WBC 8.7 4.8  HGB 11.2* 9.8*  HCT 36.6 32.1*  MCV 87.4 89.2  PLT 196 143*   Cardiac Enzymes: No results for input(s): CKTOTAL, CKMB, CKMBINDEX, TROPONINI in the last 168 hours. BNP: BNP (last 3 results) No results for input(s): BNP in  the last 8760 hours.  ProBNP (last 3 results) No results for input(s): PROBNP in the last 8760 hours.  CBG: No results for input(s): GLUCAP in the last 168 hours.     Signed: Marlin Canary [facility] 10/15/2017, 4:21 PM

## 2017-10-15 NOTE — Care Management Note (Signed)
Case Management Note  Patient Details  Name: Sandra [name] MRN: [id number] Date of Birth: 1970/09/19  Subjective/Objective:         Pt presented for pyelonephritis.  Pt is uninsured and cannot pay for prescriptions.   Pt also has no PCP.         Action/Plan: Pt given MATCH letter and information to call clinics to schedule appointment.    Expected Discharge Date:     10/15/17             Expected Discharge Plan:  Home/Self Care  In-House Referral:  NA  Discharge planning Services  CM Consult, MATCH Program  Post Acute Care Choice:  NA Choice offered to:  NA  DME Arranged:    DME Agency:     HH Arranged:    HH Agency:     Status of Service:  Completed, signed off  If discussed at [employer] Length of Stay Meetings, dates discussed:    Additional Comments:  Sandra Furlongshley  [name], RN 10/15/2017, 4:04 PM

## 2017-10-15 NOTE — Progress Notes (Signed)
Patient verbalizes understanding of discharge instructions. Opportunity for questioning and answers were provided. Armband removed by staff, pt discharged from 5C. Pt left in NAD.  

## 2017-10-15 NOTE — Progress Notes (Signed)
Pt voiced relief of nausea with medications. Pt able to urinate without difficulty during shift.

## 2017-10-16 LAB — URINE CULTURE: Culture: >=100000 — AB

## 2017-10-19 LAB — CULTURE, BLOOD (ROUTINE X 2)
CULTURE: NO GROWTH
Culture: NO GROWTH

## 2017-11-29 ENCOUNTER — Encounter: Payer: Self-pay | Admitting: Family Medicine

## 2017-11-29 ENCOUNTER — Ambulatory Visit: Payer: Self-pay | Attending: Family Medicine | Admitting: Family Medicine

## 2017-11-29 VITALS — BP 133/89 | HR 53 | Temp 98.2°F | Resp 18 | Ht 64.0 in | Wt 198.0 lb

## 2017-11-29 DIAGNOSIS — N3000 Acute cystitis without hematuria: Secondary | ICD-10-CM

## 2017-11-29 DIAGNOSIS — R638 Other symptoms and signs concerning food and fluid intake: Secondary | ICD-10-CM

## 2017-11-29 DIAGNOSIS — G4730 Sleep apnea, unspecified: Secondary | ICD-10-CM | POA: Insufficient documentation

## 2017-11-29 DIAGNOSIS — F419 Anxiety disorder, unspecified: Secondary | ICD-10-CM | POA: Insufficient documentation

## 2017-11-29 DIAGNOSIS — N12 Tubulo-interstitial nephritis, not specified as acute or chronic: Secondary | ICD-10-CM | POA: Insufficient documentation

## 2017-11-29 DIAGNOSIS — I10 Essential (primary) hypertension: Secondary | ICD-10-CM

## 2017-11-29 DIAGNOSIS — K3184 Gastroparesis: Secondary | ICD-10-CM | POA: Insufficient documentation

## 2017-11-29 DIAGNOSIS — I4891 Unspecified atrial fibrillation: Secondary | ICD-10-CM | POA: Insufficient documentation

## 2017-11-29 DIAGNOSIS — D649 Anemia, unspecified: Secondary | ICD-10-CM

## 2017-11-29 DIAGNOSIS — Z8249 Family history of ischemic heart disease and other diseases of the circulatory system: Secondary | ICD-10-CM | POA: Insufficient documentation

## 2017-11-29 DIAGNOSIS — Z6833 Body mass index (BMI) 33.0-33.9, adult: Secondary | ICD-10-CM | POA: Insufficient documentation

## 2017-11-29 DIAGNOSIS — Z79899 Other long term (current) drug therapy: Secondary | ICD-10-CM | POA: Insufficient documentation

## 2017-11-29 DIAGNOSIS — R21 Rash and other nonspecific skin eruption: Secondary | ICD-10-CM

## 2017-11-29 DIAGNOSIS — N926 Irregular menstruation, unspecified: Secondary | ICD-10-CM

## 2017-11-29 DIAGNOSIS — Z833 Family history of diabetes mellitus: Secondary | ICD-10-CM | POA: Insufficient documentation

## 2017-11-29 DIAGNOSIS — G8929 Other chronic pain: Secondary | ICD-10-CM | POA: Insufficient documentation

## 2017-11-29 DIAGNOSIS — N390 Urinary tract infection, site not specified: Secondary | ICD-10-CM

## 2017-11-29 DIAGNOSIS — Z87442 Personal history of urinary calculi: Secondary | ICD-10-CM | POA: Insufficient documentation

## 2017-11-29 DIAGNOSIS — E7800 Pure hypercholesterolemia, unspecified: Secondary | ICD-10-CM | POA: Insufficient documentation

## 2017-11-29 DIAGNOSIS — E669 Obesity, unspecified: Secondary | ICD-10-CM | POA: Insufficient documentation

## 2017-11-29 DIAGNOSIS — IMO0001 Reserved for inherently not codable concepts without codable children: Secondary | ICD-10-CM

## 2017-11-29 DIAGNOSIS — Z888 Allergy status to other drugs, medicaments and biological substances status: Secondary | ICD-10-CM | POA: Insufficient documentation

## 2017-11-29 DIAGNOSIS — F329 Major depressive disorder, single episode, unspecified: Secondary | ICD-10-CM | POA: Insufficient documentation

## 2017-11-29 DIAGNOSIS — R000 Tachycardia, unspecified: Secondary | ICD-10-CM

## 2017-11-29 DIAGNOSIS — Z87448 Personal history of other diseases of urinary system: Secondary | ICD-10-CM

## 2017-11-29 DIAGNOSIS — Z2821 Immunization not carried out because of patient refusal: Secondary | ICD-10-CM | POA: Insufficient documentation

## 2017-11-29 DIAGNOSIS — N910 Primary amenorrhea: Secondary | ICD-10-CM | POA: Insufficient documentation

## 2017-11-29 DIAGNOSIS — Z87440 Personal history of urinary (tract) infections: Secondary | ICD-10-CM | POA: Insufficient documentation

## 2017-11-29 DIAGNOSIS — R631 Polydipsia: Secondary | ICD-10-CM | POA: Insufficient documentation

## 2017-11-29 DIAGNOSIS — M545 Low back pain: Secondary | ICD-10-CM | POA: Insufficient documentation

## 2017-11-29 DIAGNOSIS — G2581 Restless legs syndrome: Secondary | ICD-10-CM | POA: Insufficient documentation

## 2017-11-29 DIAGNOSIS — K219 Gastro-esophageal reflux disease without esophagitis: Secondary | ICD-10-CM

## 2017-11-29 DIAGNOSIS — B9689 Other specified bacterial agents as the cause of diseases classified elsewhere: Secondary | ICD-10-CM | POA: Insufficient documentation

## 2017-11-29 LAB — POCT URINALYSIS DIP (CLINITEK)
Bilirubin, UA: NEGATIVE
Blood, UA: NEGATIVE
Glucose, UA: NEGATIVE mg/dL
Ketones, POC UA: NEGATIVE mg/dL
Nitrite, UA: NEGATIVE
Spec Grav, UA: 1.02
Urobilinogen, UA: 0.2 U/dL
pH, UA: 6.5

## 2017-11-29 LAB — HEMOGLOBIN A1C: A1c: 5.4

## 2017-11-29 LAB — POCT URINE PREGNANCY: Preg Test, Ur: NEGATIVE

## 2017-11-29 MED ORDER — CIPROFLOXACIN HCL 500 MG PO TABS: 500.0000 mg | ORAL_TABLET | Freq: Two times a day (BID) | ORAL | 0 refills | Status: AC

## 2017-11-29 MED ORDER — PERMETHRIN 5 % EX CREA: 1.0000 "application " | TOPICAL_CREAM | Freq: Once | CUTANEOUS | 0 refills | Status: AC

## 2017-11-29 MED ORDER — CEFTRIAXONE SODIUM 1 G IJ SOLR: 1.0000 g | Freq: Once | INTRAMUSCULAR | Status: DC

## 2017-11-29 MED ORDER — METOPROLOL TARTRATE 25 MG PO TABS: 12.5000 mg | ORAL_TABLET | Freq: Two times a day (BID) | ORAL | 6 refills | Status: AC

## 2017-11-29 MED ORDER — ESOMEPRAZOLE MAGNESIUM 40 MG PO CPDR: 40.0000 mg | DELAYED_RELEASE_CAPSULE | Freq: Every day | ORAL | 3 refills | Status: DC

## 2017-11-29 MED ORDER — LISINOPRIL 20 MG PO TABS: 20.0000 mg | ORAL_TABLET | Freq: Every day | ORAL | 6 refills | Status: AC

## 2017-11-29 MED ORDER — METOPROLOL TARTRATE 25 MG PO TABS: 12.5000 mg | ORAL_TABLET | Freq: Two times a day (BID) | ORAL | 6 refills | Status: DC

## 2017-11-29 MED ORDER — CLOTRIMAZOLE-BETAMETHASONE 1-0.05 % EX CREA: 1.0000 "application " | TOPICAL_CREAM | Freq: Two times a day (BID) | CUTANEOUS | 6 refills | Status: AC

## 2017-11-29 MED ORDER — CEFTRIAXONE SODIUM 500 MG IJ SOLR
1000.0000 mg | Freq: Once | INTRAMUSCULAR | Status: AC
  Administered 2017-11-29: 1000 mg via INTRAMUSCULAR

## 2017-11-29 MED ORDER — LISINOPRIL 20 MG PO TABS: 20.0000 mg | ORAL_TABLET | Freq: Every day | ORAL | 6 refills | Status: DC

## 2017-11-29 MED FILL — PERMETHRIN 5% CREAM: 5 | 14 days supply | Qty: 60 | Fill #0

## 2017-11-29 MED FILL — CIPROFLOXACIN HCL 500 MG TA: 500 | 7 days supply | Qty: 14 | Fill #0

## 2017-11-29 MED FILL — LISINOPRIL 20 MG TAB: 20 | 30 days supply | Qty: 30 | Fill #0

## 2017-11-29 MED FILL — CLOTRIMAZOLE-BETAMETHASONE: 1-0.05 | 10 days supply | Qty: 30 | Fill #0

## 2017-11-29 MED FILL — METOPROLOL TARTRATE 25 MG T: 25 | 30 days supply | Qty: 30 | Fill #0

## 2017-11-29 MED FILL — ESOMEPRAZOLE MAG DR 40 MG C: 40 | 30 days supply | Qty: 30 | Fill #0

## 2017-11-29 NOTE — Progress Notes (Signed)
Subjective:    Patient ID: Sandra [name], female    DOB: [birth year], 47 y.o.   MRN: [id number]  HPI 47 yo female new to the practice.  Per chart review, patient with hospitalization on 10/14/2017 secondary to diagnosis of pyelonephritis.  Per her discharge summary, patient will need follow-up of blood and urine culture results as well as evaluation of kidney function, blood pressure and follow-up of urinary tract infection.  Patient's discharge problems in addition to her pyelonephritis included sinus tachycardia, hypertension, nephrolithiasis with history of right ureteral stent placement in August 2018, chronic pain, anemia,  GERD and depression/mood disorder.  Patient also with a history of recurrent urinary tract infection. Patient has also been out of all of her medications due to loss of insurance for about 9 months prior to her hospital admission.  Patient's urine cultures grew out E. coli and Klebsiella pneumoniae.  Patient was treated with Rocephin as an inpatient and discharged with oral Cipro.  Patient also had hypokalemia which resolved by the time of hospital discharge.  Patient was placed back on her beta-blocker, Lopressor, for treatment of sinus tachycardia and patient was also placed back on lisinopril for treatment of hypertension.   At today's visit, patient states that she was feeling fairly well until about 2 days ago when she began to have onset of increased urinary frequency.  Patient denies any increase in mid back pain but does have chronic lower back pain.  Patient denies any fever, chills or nausea.  Patient has noticed increased thirst and patient states that she has family history significant for mother and maternal grandfather with diabetes.  Patient's mother also has hypertension and patient's father has migraines.  Patient states that she has also restarted the use of her lisinopril for her hypertension.  Patient states that she has had no recent headaches or dizziness related  to her blood pressure since restarting the medication.  Patient denies any cough associated with the use of lisinopril.  Patient however has not had her period in the past 2 months.  Patient is in a committed relationship and is not currently on any form of birth control.  Patient denies any current abdominal pain.  Patient does report that she has had long-standing issues with acid reflux/GERD.  Patient states that she has tried multiple medications in the past that have not really been effective.  Patient states that she is currently taking over-the-counter Nexium and this decreases her symptoms but she still has reflux symptoms with certain foods.  Patient also with complaint of a rash which is been present for about 4 to 5 months.  Patient states that the rash initially started on the top of her feet and the rash is now behind both knees.  Patient states that the rash is very itchy and the itchy and the itching is worse at night.  Patient states that she has been using rubbing alcohol to the back of her knees to help with the itching.  Patient has not had a similar rash in the past.  Patient does have a concern that she might be diabetic as patient has had increased thirst and has family history of diabetes.    Past Medical History:  Diagnosis Date  . Anemia    a. Mild with Hgb 11.2, 09/2012. UA with mod Hgb. Instructed to f/u PCP.  Marland Kitchen Anginal pain (HCC)   . Anxiety   . Anxiety   . Atrial fibrillation (HCC)    ONCE PER PATIENT  .  Atrial fibrillation with RVR (HCC)    a. Dx 09/2012, spont conv to NSR.  Marland Kitchen Chronic lower back pain   . Depression   . Gastroparesis   . GERD (gastroesophageal reflux disease)   . Gestational diabetes   . H/O echocardiogram    a. 02/2010: done for htn/tachy while pregnant - EF 55-60%, grade 1 d/dysf, mildly dilated LA.  . High cholesterol   . History of kidney stones   . Hypertension   . Migraine   . Migraines   . Obesity   . Panic attack   . Pyelonephritis  10/14/2017  . Restless legs   . Sleep apnea    Past Surgical History:  Procedure Laterality Date  . BACK SURGERY    . CESAREAN SECTION  2012  . COLONOSCOPY    . CYSTOSCOPY W/ URETERAL STENT PLACEMENT  2006  . CYSTOSCOPY W/ URETERAL STENT PLACEMENT Right 09/16/2016   Procedure: CYSTOSCOPY WITH RETROGRADE PYELOGRAM/URETERAL STENT PLACEMENT;  Surgeon: Alfredo Martinez, MD;  Location: [facility];  Service: Urology;  Laterality: Right;  . CYSTOSCOPY/RETROGRADE/URETEROSCOPY/STONE EXTRACTION WITH BASKET  1987  . CYSTOSCOPY/URETEROSCOPY/HOLMIUM LASER/STENT PLACEMENT Right 09/28/2016   Procedure: CYSTOSCOPY RIGHT URETEROSCOPY WITH HOLMIUM LASER AND RIGHT STENT EXCHANGE;  Surgeon: Bjorn Pippin, MD;  Location: [facility];  Service: Urology;  Laterality: Right;  . DILATION AND CURETTAGE OF UTERUS  2006; 2010  . DILATION AND CURETTAGE, DIAGNOSTIC / THERAPEUTIC    . TUBAL LIGATION  2012   Family History  Problem Relation Age of Onset  . Diabetes Mother   . Hypertension Mother   . Migraines Father   . Migraines Brother    Social History   Tobacco Use  . Smoking status: Never Smoker  . Smokeless tobacco: Never Used  Substance Use Topics  . Alcohol use: No  . Drug use: No   Allergies  Allergen Reactions  . Iodides Hives  . Imitrex [Sumatriptan]     Pt states she takes imitrex injection-"sometimes it works and sometimes doesn't"  . Iodine Hives  . Toradol [Ketorolac Tromethamine] Hives  . Metoclopramide Hcl Anxiety      Review of Systems  Constitutional: Positive for fatigue. Negative for chills, diaphoresis and fever.  HENT: Positive for congestion (Allergic rhinitis) and postnasal drip. Negative for sore throat and trouble swallowing.   Respiratory: Negative for cough and shortness of breath.   Cardiovascular: Negative for chest pain, palpitations and leg swelling.  Gastrointestinal: Negative for abdominal pain and nausea.  Genitourinary: Positive for frequency. Negative for dysuria, flank  pain and hematuria.  Musculoskeletal: Positive for back pain (Patient has had chronic low back pain and reports prior treatment with injections by pain management). Negative for gait problem and joint swelling.  Skin: Positive for rash. Negative for wound.  Neurological: Negative for dizziness, light-headedness and headaches.       Objective:   Physical Exam BP 133/89 (BP Location: Left Arm, Patient Position: Sitting, Cuff Size: Normal)   Pulse (!) 53   Temp 98.2 F (36.8 C) (Oral)   Resp 18   Ht 5\' 4"  (1.626 m)   Wt 198 lb (89.8 kg)   LMP 09/29/2017   SpO2 97%   BMI 33.99 kg/m Vital signs and nurse's notes reviewed General-well-nourished, well-developed obese female in no acute distress ENT- TMs dull bilaterally, patient with moderate edema of the nasal turbinates with mild clear nasal discharge, patient with mild posterior pharynx erythema Neck-supple, no lymphadenopathy, no thyromegaly, no carotid bruit Lungs-clear to auscultation bilaterally Cardiovascular-regular rate and  rhythm Abdomen- soft, no reproducible abdominal pain on examination Back-no CVA tenderness, patient with mild lumbosacral discomfort to palpation and mild bilateral lumbar paraspinous spasm Skin- patient with a maculopapular slightly erythematous rash on the top of the feet and patient with a maculopapular plaque-like rash with raised borders behind the knees bilaterally extending onto the upper posterior thighs      Assessment & Plan:  1. Recurrent urinary tract infection Patient is status post recent overnight hospitalization from 10/14/2017 to 10/15/2017 secondary to pyelonephritis and patient has a history of recurrent renal stones as well as history of recurrent urinary tract infection.  Patient reports that she has had 2 days of increased urinary frequency.  Patient will have repeat urinalysis at today's visit.  Patient will also have CMP done in follow-up of recent hospitalization and follow-up of labs.   Patient's urine was abnormal and therefore will be sent for culture. - POCT URINALYSIS DIP (CLINITEK) - Comprehensive metabolic panel - Urine Culture  2. Essential hypertension Patient has restarted the use of lisinopril and follow-up of hypertension and patient additionally reports that she has had issues with proteinuria due to her recurrent kidney stones.  I discussed with the patient that this medication is not safe to use during pregnancy and patient should use a back-up form of contraception and may need to be changed to a different medication if she is planning to become pregnant.  If patient finds that she is pregnant, patient does need to discontinue the medication and have medical follow-up.  Patient will have CMP in follow-up of use of medication.  Patient is provided with new refill of lisinopril for continued control of her blood pressure.  Patient has not had her menses in the past 2 months and therefore urine hCG will be performed as well. - Comprehensive metabolic panel - lisinopril (PRINIVIL,ZESTRIL) 20 MG tablet; Take 1 tablet (20 mg total) by mouth daily. To control blood pressure  Dispense: 30 tablet; Refill: 6 - metoprolol tartrate (LOPRESSOR) 25 MG tablet; Take 0.5 tablets (12.5 mg total) by mouth 2 (two) times daily.  Dispense: 30 tablet; Refill: 6  3. Sinus tachycardia Patient with sinus tachycardia during her recent hospitalization and patient was placed on metoprolol/Lopressor to take 12.5 mg twice daily.  Patient's pulse at today's visit is 53 but patient is asymptomatic.  Patient is provided with refill of Lopressor.  4. Normocytic anemia Patient with normocytic anemia on recent labs during her hospitalization.  Patient had a hemoglobin of 9.8 with MCV of 89.2.  Patient likely with anemia of chronic disease.  Patient will have CBC at today's visit done in follow-up of her anemia. - CBC with Differential   5. Acute cystitis without hematuria Patient had urinalysis done  at today's visit which was negative for blood but patient had moderate leukocytes.  Patient treated with Rocephin 1 g IM x1 and urine will be sent for culture.  Prescription will be sent to pharmacy for Cipro 500 mg twice daily x7 days while urine culture is pending.  Patient had growth of E. coli and Klebsiella pneumoniae on recent culture done during hospitalization and patient was treated inpatient with Rocephin and outpatient with Cipro. - Urine Culture  6. History of pyelonephritis Patient with history of pyelonephritis and patient with moderate WBCs on urinalysis at today's visit.  Patient treated with Rocephin 1 g IM here in the office and urine culture will be done and patient will be notified of the changes needed in antibiotic therapy based  on the results - Urine Culture  7. Late menses Patient reports that she has not had her menses in the past 2 months.  Patient will have urine pregnancy test done at today's visit as patient is also on lisinopril which is not safe to use during pregnancy.  Patient with negative urine pregnancy test. - POCT urine pregnancy  8. Rash and nonspecific skin eruption Patient with complaint of a rash that has been present for the past 4 to 5 months.  I discussed with the patient that there could be several etiologies for the rash.  Due to the location of the rash and the fact that it has spread from her feet to the back of her knees and patient has worse itching at night, rash is suspicious for scabies.  Patient is being prescribed Elimite cream and instructions were reviewed with the patient to apply this cream to the entire body, let dry and leave on for 8 hours before washing off.  Repeat in 1 week if needed.  Patient however could have dyshidrotic eczema/atopic dermatitis as the cause of her rash.  After use of the Elimite, patient may use Lotrisone cream twice daily x10 days to the affected area and then as needed.  Patient should notify the office if her rash  does not resolve. - permethrin (ELIMITE) 5 % cream; Apply 1 application topically once for 1 dose. Apply all over, let dry and then wash off after 8 hours; repeat in 1 week if needed  Dispense: 60 g; Refill: 0 - clotrimazole-betamethasone (LOTRISONE) cream; Apply 1 application topically 2 (two) times daily. X 10 days then as needed to affected skin areas  Dispense: 30 g; Refill: 6  9. Gastroesophageal reflux disease, esophagitis presence not specified Patient with complaint of increased reflux symptoms and history of GERD.  Patient will be prescribed Nexium 40 mg daily and patient should avoid the use of trigger foods and avoidance of late night eating. - esomeprazole (NEXIUM) 40 MG capsule; Take 1 capsule (40 mg total) by mouth daily.  Dispense: 30 capsule; Refill: 3  10. Thirst Patient with complaint of increased thirst and patient with family history of diabetes and will have hemoglobin A1c done at today's visit.  Patient did have some slightly elevated blood sugars during her hospitalization but these were not listed as fasting.  Hemoglobin A1c was normal at 5.4 at today's visit. - Hemoglobin A1c  *Patient declines influenza immunization at today's visit  An After Visit Summary was printed and given to the patient. Allergies as of 11/29/2017      Reactions   Iodides Hives   Imitrex [sumatriptan]    Pt states she takes imitrex injection-"sometimes it works and sometimes doesn't"   Iodine Hives   Toradol [ketorolac Tromethamine] Hives   Metoclopramide Hcl Anxiety      Medication List        Accurate as of 11/29/17 11:57 AM. Always use your most recent med list.          ciprofloxacin 500 MG tablet Commonly known as:  CIPRO Take 1 tablet (500 mg total) by mouth 2 (two) times daily for 7 days.   clotrimazole-betamethasone cream Commonly known as:  LOTRISONE Apply 1 application topically 2 (two) times daily. X 10 days then as needed to affected skin areas   esomeprazole 40 MG  capsule Commonly known as:  NEXIUM Take 1 capsule (40 mg total) by mouth daily.   gabapentin 300 MG capsule Commonly known as:  NEURONTIN Take  300 mg by mouth 3 (three) times daily.   hydrOXYzine 10 MG tablet Commonly known as:  ATARAX/VISTARIL Take 1 tablet (10 mg total) by mouth 3 (three) times daily as needed for anxiety.   lisinopril 20 MG tablet Commonly known as:  PRINIVIL,ZESTRIL Take 1 tablet (20 mg total) by mouth daily. To control blood pressure   metoprolol tartrate 25 MG tablet Commonly known as:  LOPRESSOR Take 0.5 tablets (12.5 mg total) by mouth 2 (two) times daily.   permethrin 5 % cream Commonly known as:  ELIMITE Apply 1 application topically once for 1 dose. Apply all over, let dry and then wash off after 8 hours; repeat in 1 week if needed      Return in about 2 weeks (around 12/13/2017).

## 2017-11-29 NOTE — Patient Instructions (Signed)

## 2017-11-30 ENCOUNTER — Telehealth: Payer: Self-pay

## 2017-11-30 LAB — CBC WITH DIFFERENTIAL/PLATELET
Basophils Absolute: 0 x10E3/uL (ref 0.0–0.2)
Basos: 1 %
EOS (ABSOLUTE): 0 x10E3/uL (ref 0.0–0.4)
Eos: 0 %
Hematocrit: 38 % (ref 34.0–46.6)
Hemoglobin: 12.3 g/dL (ref 11.1–15.9)
Immature Grans (Abs): 0 x10E3/uL (ref 0.0–0.1)
Immature Granulocytes: 0 %
Lymphocytes Absolute: 1.5 x10E3/uL (ref 0.7–3.1)
Lymphs: 25 %
MCH: 28.3 pg (ref 26.6–33.0)
MCHC: 32.4 g/dL (ref 31.5–35.7)
MCV: 88 fL (ref 79–97)
Monocytes Absolute: 0.3 x10E3/uL (ref 0.1–0.9)
Monocytes: 6 %
Neutrophils Absolute: 4 x10E3/uL (ref 1.4–7.0)
Neutrophils: 68 %
Platelets: 273 x10E3/uL (ref 150–450)
RBC: 4.34 x10E6/uL (ref 3.77–5.28)
RDW: 15.8 % — ABNORMAL HIGH (ref 12.3–15.4)
WBC: 5.8 x10E3/uL (ref 3.4–10.8)

## 2017-11-30 LAB — COMPREHENSIVE METABOLIC PANEL WITH GFR
ALT: 8 IU/L (ref 0–32)
AST: 9 IU/L (ref 0–40)
Albumin/Globulin Ratio: 1.8 (ref 1.2–2.2)
Albumin: 3.9 g/dL (ref 3.5–5.5)
Alkaline Phosphatase: 40 IU/L (ref 39–117)
BUN/Creatinine Ratio: 13 (ref 9–23)
BUN: 12 mg/dL (ref 6–24)
Bilirubin Total: 0.2 mg/dL (ref 0.0–1.2)
CO2: 23 mmol/L (ref 20–29)
Calcium: 9.3 mg/dL (ref 8.7–10.2)
Chloride: 101 mmol/L (ref 96–106)
Creatinine, Ser: 0.92 mg/dL (ref 0.57–1.00)
GFR calc Af Amer: 87 mL/min/1.73
GFR calc non Af Amer: 75 mL/min/1.73
Globulin, Total: 2.2 g/dL (ref 1.5–4.5)
Glucose: 109 mg/dL — ABNORMAL HIGH (ref 65–99)
Potassium: 4.4 mmol/L (ref 3.5–5.2)
Sodium: 139 mmol/L (ref 134–144)
Total Protein: 6.1 g/dL (ref 6.0–8.5)

## 2017-11-30 LAB — HEMOGLOBIN A1C
Est. average glucose Bld gHb Est-mCnc: 114 mg/dL
Hgb A1c MFr Bld: 5.6 % (ref 4.8–5.6)

## 2017-11-30 NOTE — Telephone Encounter (Signed)
-----   Message from Cain Saupe, MD sent at 11/30/2017 12:01 PM EDT ----- Please notify patient that her blood sugar was 109 on her recent CMET which was otherwise normal.  Patient's hemoglobin A1c was normal at 5.6 as discussed at her recent visit.  A hemoglobin A1c greater than 5.6 can be indicative of prediabetes- an increased risk of becoming diabetic.  Patient's complete blood count was normal, she is no longer anemic.

## 2017-11-30 NOTE — Telephone Encounter (Signed)
Patient was called, verified dob and was given most recent lab results. Patient verbalized understanding and had no further questions.  

## 2017-12-01 LAB — URINE CULTURE

## 2017-12-02 ENCOUNTER — Telehealth: Payer: Self-pay

## 2017-12-02 NOTE — Telephone Encounter (Signed)
Patient was called, verified dob, and was given most recent result from urine culture. Patient verbalized understanding but stated she did not get an antibiotic when she picked up the medications from [facility]. CMA informed patient that a prescription for cipro was sent 11/29/17. Patient stated she would get medication and take it. No further questions.

## 2017-12-02 NOTE — Telephone Encounter (Signed)
-----   Message from Cain Saupe, MD sent at 12/02/2017  1:41 PM EDT ----- Patient did have growth of bacteria on her urine culture but the amount was not considered to be clinically significant. Due to her history of UTI's, it is ok if she completes the prescribed antibiotics but she should return if she has new or continued symptoms

## 2017-12-13 ENCOUNTER — Encounter: Payer: Self-pay | Admitting: Family Medicine

## 2017-12-13 ENCOUNTER — Ambulatory Visit: Payer: Self-pay | Attending: Family Medicine | Admitting: Family Medicine

## 2017-12-13 ENCOUNTER — Other Ambulatory Visit: Payer: Self-pay

## 2017-12-13 VITALS — BP 128/90 | HR 75 | Temp 97.8°F | Resp 18 | Ht 64.0 in | Wt 196.0 lb

## 2017-12-13 DIAGNOSIS — I10 Essential (primary) hypertension: Secondary | ICD-10-CM

## 2017-12-13 DIAGNOSIS — R82998 Other abnormal findings in urine: Secondary | ICD-10-CM

## 2017-12-13 DIAGNOSIS — IMO0001 Reserved for inherently not codable concepts without codable children: Secondary | ICD-10-CM

## 2017-12-13 DIAGNOSIS — Z8249 Family history of ischemic heart disease and other diseases of the circulatory system: Secondary | ICD-10-CM | POA: Insufficient documentation

## 2017-12-13 DIAGNOSIS — M545 Low back pain: Secondary | ICD-10-CM | POA: Insufficient documentation

## 2017-12-13 DIAGNOSIS — R638 Other symptoms and signs concerning food and fluid intake: Secondary | ICD-10-CM

## 2017-12-13 DIAGNOSIS — K219 Gastro-esophageal reflux disease without esophagitis: Secondary | ICD-10-CM

## 2017-12-13 DIAGNOSIS — G4730 Sleep apnea, unspecified: Secondary | ICD-10-CM | POA: Insufficient documentation

## 2017-12-13 DIAGNOSIS — Z9889 Other specified postprocedural states: Secondary | ICD-10-CM | POA: Insufficient documentation

## 2017-12-13 DIAGNOSIS — D72829 Elevated white blood cell count, unspecified: Secondary | ICD-10-CM | POA: Insufficient documentation

## 2017-12-13 DIAGNOSIS — Z888 Allergy status to other drugs, medicaments and biological substances status: Secondary | ICD-10-CM | POA: Insufficient documentation

## 2017-12-13 DIAGNOSIS — E669 Obesity, unspecified: Secondary | ICD-10-CM | POA: Insufficient documentation

## 2017-12-13 DIAGNOSIS — R21 Rash and other nonspecific skin eruption: Secondary | ICD-10-CM

## 2017-12-13 DIAGNOSIS — G8929 Other chronic pain: Secondary | ICD-10-CM | POA: Insufficient documentation

## 2017-12-13 DIAGNOSIS — N390 Urinary tract infection, site not specified: Secondary | ICD-10-CM

## 2017-12-13 DIAGNOSIS — Z833 Family history of diabetes mellitus: Secondary | ICD-10-CM | POA: Insufficient documentation

## 2017-12-13 DIAGNOSIS — R631 Polydipsia: Secondary | ICD-10-CM | POA: Insufficient documentation

## 2017-12-13 DIAGNOSIS — Z6833 Body mass index (BMI) 33.0-33.9, adult: Secondary | ICD-10-CM | POA: Insufficient documentation

## 2017-12-13 LAB — POCT URINALYSIS DIP (CLINITEK)
Bilirubin, UA: NEGATIVE
Glucose, UA: NEGATIVE mg/dL
Ketones, POC UA: NEGATIVE mg/dL
Nitrite, UA: NEGATIVE
Spec Grav, UA: 1.02
Urobilinogen, UA: 0.2 U/dL
pH, UA: 6.5

## 2017-12-13 LAB — GLUCOSE, POCT (MANUAL RESULT ENTRY): POC Glucose: 130 mg/dL — AB (ref 70–99)

## 2017-12-13 NOTE — Progress Notes (Signed)
Subjective:    Patient ID: Sandra [name], female    DOB: 05/12/1970, 47 y.o.   MRN: [id number]  HPI       47 year old female who was seen in follow-up of recent new patient visit on 11/29/2017.  Patient with a history of recurrent urinary tract infection and hospitalization 10/14/2017 secondary to pyelonephritis.  Patient also received prescriptions for lisinopril and metoprolol for continued treatment of hypertension and sinus tachycardia.  Patient also was treated for a rash that is been present for the past 4 to 5 months.  Patient was also placed on generic Nexium for treatment of acid reflux.  CBC done at her last visit showed that the patient's prior anemia had resolved.      Patient reports that she has been taking her blood pressure medication and patient reports no headaches or dizziness related to her blood pressure and patient has had no sensation of increased heart rate/palpitations while on the medication.  Patient reports that she has had some increased thirst.  Patient would like to have her blood sugar rechecked as she is concerned about the possibility of diabetes.  Patient states that she has been taking generic Nexium once daily but continues to feel as if she is having some acid reflux symptoms including burping/belching and occasional backwash of bad tasting fluid into her mouth/throat.  Patient also has some occasional burning sensation in her upper mid abdomen.  Patient denies any blood in the stool and no dark stools.  Patient denies any current nausea.        Patient reports that she did use the creams that were prescribed at her last visit for treatment of rash.  Patient reports that the itchy rash behind her knees is improved.  Patient however states that she continues to have a rash on the top of her feet.  This rash is occasionally itchy and patient states that she would like to see a dermatologist as this rash has been ongoing.  Patient denies any symptoms similar to past  pyelonephritis.  Patient denies any back pain, no nausea, no fever chills.  Patient does not believe that she is having any urinary frequency or dysuria at this time.  Past Medical History:  Diagnosis Date  . Anemia    a. Mild with Hgb 11.2, 09/2012. UA with mod Hgb. Instructed to f/u PCP.  Marland Kitchen Anginal pain (HCC)   . Anxiety   . Anxiety   . Atrial fibrillation (HCC)    ONCE PER PATIENT  . Atrial fibrillation with RVR (HCC)    a. Dx 09/2012, spont conv to NSR.  Marland Kitchen Chronic lower back pain   . Depression   . Gastroparesis   . GERD (gastroesophageal reflux disease)   . Gestational diabetes   . H/O echocardiogram    a. 02/2010: done for htn/tachy while pregnant - EF 55-60%, grade 1 d/dysf, mildly dilated LA.  . High cholesterol   . History of kidney stones   . Hypertension   . Migraine   . Migraines   . Obesity   . Panic attack   . Pyelonephritis 10/14/2017  . Restless legs   . Sleep apnea    Past Surgical History:  Procedure Laterality Date  . BACK SURGERY    . CESAREAN SECTION  2012  . COLONOSCOPY    . CYSTOSCOPY W/ URETERAL STENT PLACEMENT  2006  . CYSTOSCOPY W/ URETERAL STENT PLACEMENT Right 09/16/2016   Procedure: CYSTOSCOPY WITH RETROGRADE PYELOGRAM/URETERAL STENT PLACEMENT;  Surgeon:  Alfredo Martinez, MD;  Location: [facility];  Service: Urology;  Laterality: Right;  . CYSTOSCOPY/RETROGRADE/URETEROSCOPY/STONE EXTRACTION WITH BASKET  1987  . CYSTOSCOPY/URETEROSCOPY/HOLMIUM LASER/STENT PLACEMENT Right 09/28/2016   Procedure: CYSTOSCOPY RIGHT URETEROSCOPY WITH HOLMIUM LASER AND RIGHT STENT EXCHANGE;  Surgeon: Bjorn Pippin, MD;  Location: [facility];  Service: Urology;  Laterality: Right;  . DILATION AND CURETTAGE OF UTERUS  2006; 2010  . DILATION AND CURETTAGE, DIAGNOSTIC / THERAPEUTIC    . TUBAL LIGATION  2012   Social History   Tobacco Use  . Smoking status: Never Smoker  . Smokeless tobacco: Never Used  Substance Use Topics  . Alcohol use: No  . Drug use: No   Family History    Problem Relation Age of Onset  . Diabetes Mother   . Hypertension Mother   . Migraines Father   . Migraines Brother    Allergies  Allergen Reactions  . Iodides Hives  . Imitrex [Sumatriptan]     Pt states she takes imitrex injection-"sometimes it works and sometimes doesn't"  . Iodine Hives  . Toradol [Ketorolac Tromethamine] Hives  . Metoclopramide Hcl Anxiety     Review of Systems  Constitutional: Positive for fatigue. Negative for chills and fever.  HENT: Positive for congestion. Negative for sore throat and trouble swallowing.   Respiratory: Negative for cough and shortness of breath.   Cardiovascular: Negative for chest pain, palpitations and leg swelling.  Gastrointestinal: Negative for abdominal pain, blood in stool and nausea.  Genitourinary: Negative for dysuria and frequency.  Musculoskeletal: Negative for back pain and gait problem.  Skin: Positive for rash. Negative for wound.  Neurological: Negative for dizziness and headaches.       Objective:   Physical Exam BP 128/90   Pulse 75   Temp 97.8 F (36.6 C) (Oral)   Resp 18   Ht 5\' 4"  (1.626 m)   Wt 196 lb (88.9 kg)   SpO2 97%   BMI 33.64 kg/m   General-well-nourished, well-developed overweight female in no acute distress Mouth- mild posterior pharynx erythema, oral mucosa appears moist Lungs-clear to auscultation bilaterally Cardiovascular-regular rate and rhythm Back-no CVA tenderness Abdomen-truncal obesity, soft and nontender Extremities-no edema Skin- patient with hyperpigmented macular rash on the top of the left foot (this was the only area of rash that patient showed when I asked her to show me the areas of rash from her last visit)        Assessment & Plan:  1. Essential hypertension Patient's blood pressure is now controlled on combination of lisinopril 20 mg and metoprolol 12.5 mg twice daily which she will continue.  2. Gastroesophageal reflux disease, esophagitis presence not  specified Patient with continued reflux symptoms.  Patient is currently on PPI therapy therefore instead of a H. pylori breath test, patient will have H. pylori antibody testing.  Patient will also be referred to gastroenterology for further evaluation and treatment - H Pylori, IGM, IGG, IGA AB - Ambulatory referral to Gastroenterology  3. Recurrent UTI (urinary tract infection) Patient with history of recurrent urinary tract infections and patient was asked to give urine sample at today's visit to check for possible urinary tract infection.  Patient with small blood and small leukocytes on urinalysis and patient urine will be sent for culture. - POCT URINALYSIS DIP (CLINITEK) - Urine Culture  4. Thirst Patient with increased thirst complaint.  Patient will have urinalysis to look for glucosuria and patient had a glucose level which was elevated at 130 random.  Patient has  had recent hemoglobin A1c on 11/29/2017 which was normal at 5.6.  Patient is encouraged to continue a low carbohydrate diet and avoid concentrated sweets.  Patient is also encouraged to try and limit processed foods.  Patient encouraged to increase hydration with water.  Patient may also have some increased thirst related to medication use. - Glucose (CBG)  5. Rash Patient with complaint of continued rash on her feet.  I discussed with the patient that this is likely a form of dyshidrotic eczema.  Patient encouraged to use steroid cream prescribed at her last visit as well as to make sure that her skin remains moisturized.  Patient wishes to be referred to dermatology as well and referral was placed. - Ambulatory referral to Dermatology  6. Leukocytes in urine Patient with abnormal urinalysis with leukocytes in the urine as well as a small amount of blood.  Patient's urine will be sent for culture and patient will be notified if further treatment is needed based on the culture results. - Urine Culture  An After Visit Summary  was printed and given to the patient.  Return in about 4 months (around 04/15/2018) for HTN.

## 2017-12-13 NOTE — Progress Notes (Signed)
Pain:  Lower back and legs chronic, 5  Heart burn doesn't resolve unless poatient takes 2.  Still present but less itchy   bs 130

## 2017-12-15 LAB — URINE CULTURE

## 2017-12-16 LAB — H PYLORI, IGM, IGG, IGA AB
H pylori, IgM Abs: <9 U (ref 0.0–8.9)
H. pylori, IgA Abs: 9.9 U — ABNORMAL HIGH (ref 0.0–8.9)
H. pylori, IgG AbS: 0.17 {index_val} (ref 0.00–0.79)

## 2017-12-19 ENCOUNTER — Other Ambulatory Visit: Payer: Self-pay | Admitting: Family Medicine

## 2017-12-19 ENCOUNTER — Telehealth: Payer: Self-pay | Admitting: Family Medicine

## 2017-12-19 ENCOUNTER — Telehealth: Payer: Self-pay

## 2017-12-19 DIAGNOSIS — R768 Other specified abnormal immunological findings in serum: Secondary | ICD-10-CM

## 2017-12-19 DIAGNOSIS — R7689 Other specified abnormal immunological findings in serum: Secondary | ICD-10-CM

## 2017-12-19 MED ORDER — CLARITHROMYCIN 500 MG PO TABS: 500.0000 mg | ORAL_TABLET | Freq: Two times a day (BID) | ORAL | 0 refills | Status: DC

## 2017-12-19 MED ORDER — OMEPRAZOLE 40 MG PO CPDR: 40.0000 mg | DELAYED_RELEASE_CAPSULE | Freq: Two times a day (BID) | ORAL | 0 refills | Status: DC

## 2017-12-19 MED ORDER — AMOXICILLIN 500 MG PO CAPS: 500.0000 mg | ORAL_CAPSULE | Freq: Two times a day (BID) | ORAL | 0 refills | Status: DC

## 2017-12-19 NOTE — Telephone Encounter (Signed)
Patient was called, no answer, lvm to return call regarding results.  

## 2017-12-19 NOTE — Progress Notes (Signed)
Patient ID: Sandra [name], female   DOB: 01-Mar-1970, 47 y.o.   MRN: [id number]   Patient with a positive H. pylori antibody test.  Prescriptions for amoxicillin and clarithromycin to be sent to patient's pharmacy to continue along with omeprazole.  Patient will need 14 days of therapy to help eradicate H. pylori and this should improve patient's abdominal pain.

## 2017-12-19 NOTE — Telephone Encounter (Signed)
-----   Message from Cain Saupe, MD sent at 12/19/2017  8:52 AM EST ----- Please notify patient that IgA antibodies to H. Pylori were positive and that additional antibiotics need to be prescribed for her to take in addition to medication to lower her stomach acid

## 2017-12-19 NOTE — Telephone Encounter (Signed)
Patient called back to get results. Please follow up with patient. °

## 2017-12-21 NOTE — Telephone Encounter (Signed)
Patient called requesting her lab results. Please f/u  

## 2017-12-23 NOTE — Telephone Encounter (Signed)
If her symptoms do not clear up or return then she should be tested again

## 2017-12-23 NOTE — Telephone Encounter (Signed)
Sandra Saupe, MD at 12/19/2017 8:53 AM   Status: Signed    Patient ID: Sandra [name], female   DOB: [birth year], 47 y.o.   MRN: [id number]   Patient with a positive H. pylori antibody test.  Prescriptions for amoxicillin and clarithromycin to be sent to patient's pharmacy to continue along with omeprazole.  Patient will need 14 days of therapy to help eradicate H. pylori and this should improve patient's abdominal pain.      Pt aware of result above per Dr. Jillyn Hidden. Verbalized understanding. She wanted to know if she needed to be retested after medication is taken. Please advise.

## 2017-12-27 NOTE — Telephone Encounter (Signed)
Pt aware of message. Left message on voicemail.

## 2017-12-30 MED FILL — CLARITHROMYCIN 500 MG TAB: 500 | 14 days supply | Qty: 28 | Fill #0

## 2017-12-30 MED FILL — METOPROLOL TARTRATE 25 MG T: 25 | 30 days supply | Qty: 30 | Fill #1

## 2017-12-30 MED FILL — LISINOPRIL 20 MG TAB: 20 | 30 days supply | Qty: 30 | Fill #1

## 2017-12-30 MED FILL — AMOXICILLIN 500 MG CAPSULE: 500 | 14 days supply | Qty: 28 | Fill #0

## 2017-12-30 MED FILL — OMEPRAZOLE DR 40 MG CAPSULE: 40 | 14 days supply | Qty: 28 | Fill #0

## 2018-01-25 ENCOUNTER — Telehealth: Payer: Self-pay | Admitting: Family Medicine

## 2018-01-25 DIAGNOSIS — R768 Other specified abnormal immunological findings in serum: Secondary | ICD-10-CM

## 2018-01-25 MED ORDER — OMEPRAZOLE 40 MG PO CPDR: 40.0000 mg | DELAYED_RELEASE_CAPSULE | Freq: Every day | ORAL | 2 refills | Status: DC

## 2018-01-25 MED FILL — OMEPRAZOLE DR 40 MG CAPSULE: 40 | 30 days supply | Qty: 30 | Fill #0

## 2018-01-25 NOTE — Telephone Encounter (Signed)
1) Medication(s) Requested (by name): omeprazole 2) Pharmacy of Choice:  [facility]

## 2018-01-25 NOTE — Telephone Encounter (Signed)
Left message on voicemail:  Attempt to call patient to ask if she would like to come in as an add-on/ SAME DAY.  Please advise if she calls office.

## 2018-01-25 NOTE — Telephone Encounter (Signed)
Patient says she feels pain in her kidneys. She feels more pain on her right side VS. Her left side and says she has frequent urination. Patient says she has previously had a kidney infections.  Patient was scheduled an appointment for 02/16/2018 @3 :50.   [facility] if she needs to be seen prior to her appt.

## 2018-01-26 ENCOUNTER — Encounter: Payer: Self-pay | Admitting: Family Medicine

## 2018-01-26 NOTE — Telephone Encounter (Signed)
Patient returned call to RN. Please call her back [phone].

## 2018-01-27 NOTE — Telephone Encounter (Signed)
Returned call to patient. She asked if the nexium was refilled. Informed that Prilosec was sent to pharmacy on 01/25/2018. Verbalized understanding. Pt addressed no other concerns.

## 2018-01-30 MED FILL — METOPROLOL TARTRATE 25 MG T: 25 | 30 days supply | Qty: 30 | Fill #2

## 2018-01-30 MED FILL — LISINOPRIL 20 MG TAB: 20 | 30 days supply | Qty: 30 | Fill #2

## 2018-02-16 ENCOUNTER — Encounter: Payer: Self-pay | Admitting: Family Medicine

## 2018-02-16 ENCOUNTER — Ambulatory Visit: Payer: Medicaid Other | Attending: Family Medicine | Admitting: Family Medicine

## 2018-02-16 VITALS — BP 135/89 | HR 85 | Temp 98.4°F | Resp 18 | Ht 64.0 in | Wt 194.0 lb

## 2018-02-16 DIAGNOSIS — Z87440 Personal history of urinary (tract) infections: Secondary | ICD-10-CM | POA: Diagnosis not present

## 2018-02-16 DIAGNOSIS — K3184 Gastroparesis: Secondary | ICD-10-CM | POA: Diagnosis not present

## 2018-02-16 DIAGNOSIS — Z8249 Family history of ischemic heart disease and other diseases of the circulatory system: Secondary | ICD-10-CM | POA: Diagnosis not present

## 2018-02-16 DIAGNOSIS — K219 Gastro-esophageal reflux disease without esophagitis: Secondary | ICD-10-CM

## 2018-02-16 DIAGNOSIS — I4891 Unspecified atrial fibrillation: Secondary | ICD-10-CM | POA: Diagnosis not present

## 2018-02-16 DIAGNOSIS — M545 Low back pain: Secondary | ICD-10-CM | POA: Insufficient documentation

## 2018-02-16 DIAGNOSIS — I10 Essential (primary) hypertension: Secondary | ICD-10-CM | POA: Diagnosis not present

## 2018-02-16 DIAGNOSIS — N3001 Acute cystitis with hematuria: Secondary | ICD-10-CM

## 2018-02-16 DIAGNOSIS — E7800 Pure hypercholesterolemia, unspecified: Secondary | ICD-10-CM | POA: Diagnosis not present

## 2018-02-16 DIAGNOSIS — R350 Frequency of micturition: Secondary | ICD-10-CM | POA: Diagnosis not present

## 2018-02-16 DIAGNOSIS — R768 Other specified abnormal immunological findings in serum: Secondary | ICD-10-CM

## 2018-02-16 LAB — POCT URINALYSIS DIP (CLINITEK)
Bilirubin, UA: NEGATIVE
Glucose, UA: NEGATIVE mg/dL
Ketones, POC UA: NEGATIVE mg/dL
Nitrite, UA: NEGATIVE
POC PROTEIN,UA: NEGATIVE
Spec Grav, UA: <=1.005 — AB
Urobilinogen, UA: 0.2 U/dL
pH, UA: 5

## 2018-02-16 MED ORDER — OMEPRAZOLE 40 MG PO CPDR: 40.0000 mg | DELAYED_RELEASE_CAPSULE | Freq: Two times a day (BID) | ORAL | 1 refills | Status: AC

## 2018-02-16 MED ORDER — CIPROFLOXACIN HCL 500 MG PO TABS: 500.0000 mg | ORAL_TABLET | Freq: Two times a day (BID) | ORAL | 0 refills | Status: AC

## 2018-02-16 MED FILL — CIPROFLOXACIN HCL 500 MG TA: 500 | 3 days supply | Qty: 6 | Fill #0

## 2018-02-16 MED FILL — OMEPRAZOLE DR 40 MG CAPSULE: 40 | 30 days supply | Qty: 60 | Fill #0

## 2018-02-16 NOTE — Progress Notes (Signed)
Subjective:    Patient ID: Sandra [name], female    DOB: 06/20/1970, 48 y.o.   MRN: [id number]  HPI       48 yo female who is seen in follow-up of GERD s/p treatment for positive H. Pylori breath test. Patient states that she felt great while she was on antibiotic therapy however as soon as she finished taking the antibiotics, her reflux symptoms, burping/belching and occasional burning sensation in her upper abdomen and backwash of fluid into her throat. Patient continues to take omeprazole.     Patient also with recent onset for the past 3 to 4 days of increased urinary frequency, urinary urgency and patient sometimes will feel as if she has to urinate but then does not really feel as if she can get anything out.  Patient with mild increase in mid to lower back pain.  No fever or chills.  No dysuria.  Past Medical History:  Diagnosis Date  . Anemia    a. Mild with Hgb 11.2, 09/2012. UA with mod Hgb. Instructed to f/u PCP.  Marland Kitchen Anginal pain (HCC)   . Anxiety   . Anxiety   . Atrial fibrillation (HCC)    ONCE PER PATIENT  . Atrial fibrillation with RVR (HCC)    a. Dx 09/2012, spont conv to NSR.  Marland Kitchen Chronic lower back pain   . Depression   . Gastroparesis   . GERD (gastroesophageal reflux disease)   . Gestational diabetes   . H/O echocardiogram    a. 02/2010: done for htn/tachy while pregnant - EF 55-60%, grade 1 d/dysf, mildly dilated LA.  . High cholesterol   . History of kidney stones   . Hypertension   . Migraine   . Migraines   . Obesity   . Panic attack   . Pyelonephritis 10/14/2017  . Restless legs   . Sleep apnea    Past Surgical History:  Procedure Laterality Date  . BACK SURGERY    . CESAREAN SECTION  2012  . COLONOSCOPY    . CYSTOSCOPY W/ URETERAL STENT PLACEMENT  2006  . CYSTOSCOPY W/ URETERAL STENT PLACEMENT Right 09/16/2016   Procedure: CYSTOSCOPY WITH RETROGRADE PYELOGRAM/URETERAL STENT PLACEMENT;  Surgeon: Alfredo Martinez, MD;  Location: [facility];  Service:  Urology;  Laterality: Right;  . CYSTOSCOPY/RETROGRADE/URETEROSCOPY/STONE EXTRACTION WITH BASKET  1987  . CYSTOSCOPY/URETEROSCOPY/HOLMIUM LASER/STENT PLACEMENT Right 09/28/2016   Procedure: CYSTOSCOPY RIGHT URETEROSCOPY WITH HOLMIUM LASER AND RIGHT STENT EXCHANGE;  Surgeon: Bjorn Pippin, MD;  Location: [facility];  Service: Urology;  Laterality: Right;  . DILATION AND CURETTAGE OF UTERUS  2006; 2010  . DILATION AND CURETTAGE, DIAGNOSTIC / THERAPEUTIC    . TUBAL LIGATION  2012   Family History  Problem Relation Age of Onset  . Diabetes Mother   . Hypertension Mother   . Migraines Father   . Migraines Brother    Social History   Tobacco Use  . Smoking status: Never Smoker  . Smokeless tobacco: Never Used  Substance Use Topics  . Alcohol use: No  . Drug use: No   Allergies  Allergen Reactions  . Iodides Hives  . Imitrex [Sumatriptan]     Pt states she takes imitrex injection-"sometimes it works and sometimes doesn't"  . Iodine Hives  . Toradol [Ketorolac Tromethamine] Hives  . Metoclopramide Hcl Anxiety      Review of Systems  Constitutional: Positive for fatigue. Negative for chills and fever.  Respiratory: Negative for cough and shortness of breath.   Cardiovascular:  Negative for chest pain, palpitations and leg swelling.  Gastrointestinal: Positive for abdominal pain and nausea. Negative for blood in stool, constipation and diarrhea.  Genitourinary: Positive for frequency and urgency. Negative for dysuria.  Musculoskeletal: Positive for back pain. Negative for gait problem.  Neurological: Negative for dizziness and headaches.       Objective:   Physical Exam BP 135/89 (BP Location: Left Arm, Patient Position: Sitting, Cuff Size: Large)   Pulse 85   Temp 98.4 F (36.9 C) (Oral)   Resp 18   Ht 5\' 4"  (1.626 m)   Wt 194 lb (88 kg)   LMP 02/16/2018   SpO2 98%   BMI 33.30 kg/m  Nurse's notes and vital signs reviewed General-well-nourished, well-developed overweight for  height female in no acute distress but patient appears fatigued Lungs-clear to auscultation bilaterally Cardiovascular-regular rate and rhythm Abdomen-truncal obesity, soft, no reproducible epigastric tenderness on exam.  Patient does have presence of suprapubic tenderness on exam, no rebound or guarding. Back-no CVA tenderness.  Mild thoracolumbar paraspinous spasm      Assessment & Plan:  1. Gastroesophageal reflux disease, esophagitis presence not specified Patient with complaint of continued GERD symptoms despite recent treatment for H. pylori with amoxicillin and Biaxin.  Patient will be referred to GI for further evaluation and treatment.  Patient should avoid known trigger foods as well as avoidance of late night eating and new prescription provided for increased dose of omeprazole to take twice daily until evaluated by GI. - omeprazole (PRILOSEC) 40 MG capsule; Take 1 capsule (40 mg total) by mouth 2 (two) times daily. To reduce stomach acid  Dispense: 60 capsule; Refill: 1 - Ambulatory referral to Gastroenterology  2. Urinary frequency Patient with complaint of urinary frequency and urgency similar to prior urinary tract infections.  Patient was asked to give urine sample for UA to look for possible infection - POCT URINALYSIS DIP (CLINITEK)  3. Acute cystitis with hematuria Patient's urinalysis with leukocytes and red blood cells.  Patient will be placed on Cipro 500 mg twice daily x3 days and urine will be sent for culture and patient will be notified if a change in therapy is needed based on the culture results.  Patient will return to clinic in 2 to 3 weeks for recheck of urinalysis, sooner if continued symptoms. - ciprofloxacin (CIPRO) 500 MG tablet; Take 1 tablet (500 mg total) by mouth 2 (two) times daily.  Dispense: 6 tablet; Refill: 0 - Urine Culture  An After Visit Summary was printed and given to the patient.  Return in about 3 weeks (around 03/09/2018) for UTI/GERD-f/u in  2-3 weeks.

## 2018-02-18 LAB — URINE CULTURE

## 2018-02-21 ENCOUNTER — Telehealth: Payer: Self-pay | Admitting: Licensed Clinical Social Worker

## 2018-02-21 NOTE — Telephone Encounter (Signed)
Call placed to patient to follow up on behavioral health screens. A voicemail was not established; therefore, [facility] was unable to leave a voice message.

## 2018-02-24 ENCOUNTER — Telehealth: Payer: Self-pay | Admitting: Family Medicine

## 2018-02-24 NOTE — Telephone Encounter (Signed)
Patient called requesting her urine culture results. Please f/u

## 2018-02-27 NOTE — Telephone Encounter (Signed)
Patient verified DOB Patient is aware of no other antibiotics being needed from culture growth.

## 2018-02-27 NOTE — Telephone Encounter (Signed)
-----   Message from Cain Saupe, MD sent at 02/19/2018  6:01 PM EST ----- Notify patient that her urine culture showed mixed urogenital flora which are bacteria normally found in the genital area therefore no further antibiotic treatment is needed

## 2018-03-03 MED FILL — LISINOPRIL 20 MG TAB: 20 | 30 days supply | Qty: 30 | Fill #3

## 2018-03-03 MED FILL — METOPROLOL TARTRATE 25 MG T: 25 | 30 days supply | Qty: 30 | Fill #3

## 2018-03-30 MED FILL — METOPROLOL TARTRATE 25 MG T: 25 | 30 days supply | Qty: 30 | Fill #4 | Status: TO

## 2018-03-30 MED FILL — LISINOPRIL 20 MG TAB: 20 | 30 days supply | Qty: 30 | Fill #4 | Status: TO

## 2018-03-30 MED FILL — OMEPRAZOLE DR 40 MG CAPSULE: 40 | 30 days supply | Qty: 60 | Fill #1

## 2018-04-17 ENCOUNTER — Ambulatory Visit: Payer: Self-pay | Admitting: Family Medicine

## 2018-04-19 ENCOUNTER — Telehealth: Payer: Self-pay | Admitting: Family Medicine

## 2018-04-19 NOTE — Telephone Encounter (Signed)
1) Medication(s) Requested (by name):omeprazole (PRILOSEC) 40 MG capsule [[id number]   2) Pharmacy of Choice: [facility]   3) Special Requests:   Approved medications will be sent to the pharmacy, we will reach out if there is an issue.  Requests made after 3pm may not be addressed until the following business day!  If a patient is unsure of the name of the medication(s) please note and ask patient to call back when they are able to provide all info, do not send to responsible party until all information is available!

## 2018-04-19 NOTE — Telephone Encounter (Signed)
Called patient to follow up on her missed appt on 04/17/18. LVM to return call and reschedule appt.

## 2018-04-20 ENCOUNTER — Other Ambulatory Visit: Payer: Self-pay | Admitting: Family Medicine

## 2018-04-20 DIAGNOSIS — K219 Gastro-esophageal reflux disease without esophagitis: Secondary | ICD-10-CM

## 2018-04-21 NOTE — Telephone Encounter (Signed)
Pt had refills w/ pharmacy. They will contact her when it is ready.
